# Patient Record
Sex: Female | Born: 1955 | Race: White | Hispanic: No | Marital: Married | Smoking: Never smoker
Health system: Southern US, Community
[De-identification: ages and names within clinical notes are randomized; demographics above are authoritative.]

## PROBLEM LIST (undated history)

## (undated) DIAGNOSIS — E785 Hyperlipidemia, unspecified: Secondary | ICD-10-CM

## (undated) DIAGNOSIS — K5792 Diverticulitis of intestine, part unspecified, without perforation or abscess without bleeding: Secondary | ICD-10-CM

## (undated) DIAGNOSIS — E039 Hypothyroidism, unspecified: Secondary | ICD-10-CM

## (undated) DIAGNOSIS — Z9289 Personal history of other medical treatment: Secondary | ICD-10-CM

## (undated) DIAGNOSIS — M1990 Unspecified osteoarthritis, unspecified site: Secondary | ICD-10-CM

## (undated) DIAGNOSIS — T8859XA Other complications of anesthesia, initial encounter: Secondary | ICD-10-CM

## (undated) DIAGNOSIS — G40909 Epilepsy, unspecified, not intractable, without status epilepticus: Secondary | ICD-10-CM

## (undated) DIAGNOSIS — T4145XA Adverse effect of unspecified anesthetic, initial encounter: Secondary | ICD-10-CM

## (undated) DIAGNOSIS — Z8669 Personal history of other diseases of the nervous system and sense organs: Secondary | ICD-10-CM

## (undated) DIAGNOSIS — R7303 Prediabetes: Secondary | ICD-10-CM

## (undated) DIAGNOSIS — G709 Myoneural disorder, unspecified: Secondary | ICD-10-CM

## (undated) DIAGNOSIS — R569 Unspecified convulsions: Secondary | ICD-10-CM

## (undated) DIAGNOSIS — H539 Unspecified visual disturbance: Secondary | ICD-10-CM

## (undated) HISTORY — DX: Prediabetes: R73.03

## (undated) HISTORY — DX: Diverticulitis of intestine, part unspecified, without perforation or abscess without bleeding: K57.92

## (undated) HISTORY — DX: Myoneural disorder, unspecified: G70.9

## (undated) HISTORY — PX: APPENDECTOMY (OPEN): SHX54

## (undated) HISTORY — PX: SPINE SURGERY: SHX786

## (undated) HISTORY — PX: JOINT REPLACEMENT: SHX530

## (undated) HISTORY — DX: Hyperlipidemia, unspecified: E78.5

## (undated) HISTORY — PX: TONSILLECTOMY: SHX5618

## (undated) HISTORY — PX: SMALL INTESTINE SURGERY: SHX150

## (undated) HISTORY — PX: COLONOSCOPY, DIAGNOSTIC (SCREENING): SHX174

## (undated) HISTORY — PX: ADENOIDECTOMY: SHX3020

## (undated) HISTORY — PX: COLON SURGERY: SHX602

## (undated) HISTORY — DX: Personal history of other diseases of the nervous system and sense organs: Z86.69

## (undated) HISTORY — PX: TONSILLECTOMY: SUR1361

## (undated) HISTORY — PX: LUMBAR LAMINECTOMY: SHX95

---
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
## 2006-05-28 HISTORY — PX: COLON SURGERY: SHX602

## 2008-05-28 LAB — HM COLONOSCOPY

## 2009-04-27 HISTORY — PX: COLON RESECTION: SHX5231

## 2011-05-29 HISTORY — PX: BUNIONECTOMY: SHX129

## 2011-08-29 ENCOUNTER — Emergency Department: Payer: Self-pay | Admitting: Emergency Medicine

## 2011-08-29 LAB — URINALYSIS, COMPLETE
Glucose,UR: NEGATIVE mg/dL (ref 0–75)
Leukocyte Esterase: NEGATIVE
Nitrite: NEGATIVE
Protein: NEGATIVE
RBC,UR: 4 /HPF (ref 0–5)
Squamous Epithelial: 5

## 2011-08-29 LAB — CBC
MCH: 30.3 pg (ref 26.0–34.0)
MCHC: 33.5 g/dL (ref 32.0–36.0)
MCV: 91 fL (ref 80–100)
Platelet: 293 10*3/uL (ref 150–440)
RBC: 4.21 10*6/uL (ref 3.80–5.20)
RDW: 13 % (ref 11.5–14.5)
WBC: 6 10*3/uL (ref 3.6–11.0)

## 2011-08-29 LAB — COMPREHENSIVE METABOLIC PANEL
Albumin: 3.6 g/dL (ref 3.4–5.0)
Alkaline Phosphatase: 47 U/L — ABNORMAL LOW (ref 50–136)
BUN: 13 mg/dL (ref 7–18)
Chloride: 106 mmol/L (ref 98–107)
Co2: 30 mmol/L (ref 21–32)
Creatinine: 0.75 mg/dL (ref 0.60–1.30)
EGFR (Non-African Amer.): >60
Glucose: 87 mg/dL (ref 65–99)
Osmolality: 284 (ref 275–301)
Potassium: 3.8 mmol/L (ref 3.5–5.1)
SGPT (ALT): 17 U/L
Sodium: 143 mmol/L (ref 136–145)

## 2011-08-29 LAB — PREGNANCY, URINE: Pregnancy Test, Urine: NEGATIVE m[IU]/mL

## 2011-08-29 LAB — LIPASE, BLOOD: Lipase: 107 U/L (ref 73–393)

## 2011-12-25 ENCOUNTER — Ambulatory Visit: Payer: Self-pay

## 2011-12-25 NOTE — Progress Notes (Signed)
 Chief Complaint: seizures.     History of Present Illness:    Please see Dr. Bettyann initial note dated March 31, 2008 and previous notes for full details. Briefly, the patient is a 56 year old, right-handed Caucasian female with a history of migraines who started having auras consisting of a rising feeling in her stomach going up to her mouth with her mouth feeling funny for 10 to 15 seconds. Afterwards, this would resolve. These often occurred in clusters but were fairly infrequent. In August 2009, she had a convulsive seizure preceded by such an episode, and then in February 2010 she had another episode.   Her first medication was Levetiracetam  but it made her moody so they tried several other medications and had side effects.She was switched back to Levetiracetam  but then had another episode, and therefore, she was put on Vimpat . She continues to have these auras along with some left arm heaviness. We have gradually increased her medication. She also had some episodes where she would have funny feelings followed by clamping down of her teeth, her neck arching back, and her screaming for help along with the left arm sensation. This would last for a few minutes but often up to 20 minutes. Her medications were increased but we had been asking repeatedly for her to come in for a video EEG.   In March 2012, she had an episode where she slumped to the left side of her chair, her eyes closed and she was able to speak in a slow voice. She had some difficulty getting words out but kept speaking and answered appropriately. Her vital signs were stable although her blood pressure was high. Because of the episode we decided again to try for video EEG, but again she was unable to come in. Since that time, she has continued on Vimpat  and Levetiracetam . She says that she eventually noticed that she always had problems when her Synthroid  has been increased. She of her own accord reduced her Synthroid  by half and since then she  reports that she has felt much better. Since that time she has not had any episodes.  At her last appointment, we tried decreasing her Vimpat  to 150 mg bid but she started getting her auras which are nervous feelings that come over her. She went back up to 200 mg bid. Her Levetiracetam  is 750 mg, 1.5 tabs twice daily. Otherwise she has been doing well. She feels like she tolerates her medication well. She does notice that she has some mild balance problems after she takes her medication.     Past Medical History:     Past Medical History   Diagnosis Date   . Seizures    . Hypertension    . Hypothyroid    . Sinus tachycardia    . Migraine headache    . Adrenal mass      Past Surgical History   Procedure Laterality Date   . Laminectomy lumbar spine         Medications:     Current Outpatient Prescriptions   Medication Sig Dispense Refill   . aspirin  81 mg tablet 1 tab by mouth daily       . clonazePAM  (KLONOPIN ) 0.5 MG tablet 1 tab by mouth as needed as needed       . CRESTOR  5 mg tablet Take 1 tablet by mouth Daily.       . lacosamide  (VIMPAT ) 200 mg tablet Take 1 tablet (200 mg total) by mouth 2 (two) times daily.  65 tablet  5   . levETIRAcetam  (KEPPRA ) 750 MG tablet 1 tab by mouth 2 times a day    4   . levothyroxine  (SYNTHROID , LEVOTHROID) 100 MCG tablet 1 tab by mouth daily       . LOVAZA 1 gram capsule Take 2 tablets by mouth Twice daily.       . metoprolol  (TOPROL -XL) 50 MG XL tablet 1 tab by mouth daily       . triamterene -hydrochlorothiazide  (DYAZIDE ) 37.5-25 mg capsule 1 cap by mouth daily         No current facility-administered medications for this visit.       Allergies:  None.     Physical Examination:    Filed Vitals:    12/25/11 0858   BP: 120/77   Pulse: 69   Resp: 14   Height: 160 cm (5' 3)   Weight: 105.688 kg (233 lb)     The patient was fully alert and oriented, her language was normal, eye movements were intact. Her face was symmetric. There was no dysmetria, there was no drift. Her gait was  normal.     Laboratory Data: pending    Discussion:  This is a 56 year old woman with a history of migraines, adrenal mass, and hypothyroidism with recurrent episodes that were concerning for complex partial seizures. I have witnessed one episode and am very concerned that it is nonepileptic. At this point the patient is still refusing to come in for video EEG. The episodes have improved with the reduction in her thyroid  medication but to me again this is not necessarily proof that these are physiological in nature.  We will continue Vimpat  200mg  bid and Levetiracetam  1125 bid. If she has more episodes or has other problems, we will again consider a video EEG in the future. If she continues to do well, we will continue downward titration in future visits.   The patient knows to call me for any questions, problems or seizures, otherwise she will follow-up in this clinic in six months.     Impression:     1. 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   2. Episodes of teeth clenching and left arm sensations in the past, likely nonepileptic.   3. Migraines.   4. Hypothyroidism.   5. Concerned that symptoms may be related to too much thyroid  replacement.     Recommendations:     1. Continue  Vimpat  200 mg bid and Levetiracetam  1125 mg.   2. We will draw levels today   3. Call for any questions, problems or worsening of seizures.   4. Follow-up in this clinic in six months         I personally performed the service. The service was performed non-incident to a physician and/or without a teaching provider being immediately available.    I was personally with the patient for 30 minutes. More than 50% of this time was spent doing counseling.

## 2012-03-19 ENCOUNTER — Observation Stay: Payer: Self-pay | Admitting: Internal Medicine

## 2012-03-19 LAB — TROPONIN I: Troponin-I: <0.02 ng/mL

## 2012-03-19 LAB — CBC
HCT: 39.3 % (ref 35.0–47.0)
HGB: 13.6 g/dL (ref 12.0–16.0)
MCH: 31.2 pg (ref 26.0–34.0)
MCHC: 34.6 g/dL (ref 32.0–36.0)
MCV: 90 fL (ref 80–100)
Platelet: 313 10*3/uL (ref 150–440)
RBC: 4.36 10*6/uL (ref 3.80–5.20)

## 2012-03-19 LAB — CK TOTAL AND CKMB (NOT AT ARMC)
CK-MB: <0.5 ng/mL — ABNORMAL LOW (ref 0.5–3.6)
CK-MB: <0.5 ng/mL — ABNORMAL LOW (ref 0.5–3.6)

## 2012-03-19 LAB — COMPREHENSIVE METABOLIC PANEL
Alkaline Phosphatase: 49 U/L — ABNORMAL LOW (ref 50–136)
BUN: 19 mg/dL — ABNORMAL HIGH (ref 7–18)
Bilirubin,Total: 0.4 mg/dL (ref 0.2–1.0)
Co2: 29 mmol/L (ref 21–32)
Creatinine: 0.81 mg/dL (ref 0.60–1.30)
EGFR (Non-African Amer.): >60
SGPT (ALT): 20 U/L (ref 12–78)
Total Protein: 7.2 g/dL (ref 6.4–8.2)

## 2012-03-20 DIAGNOSIS — R079 Chest pain, unspecified: Secondary | ICD-10-CM

## 2012-03-20 LAB — CK TOTAL AND CKMB (NOT AT ARMC)
CK, Total: 31 U/L (ref 21–215)
CK-MB: <0.5 ng/mL — ABNORMAL LOW (ref 0.5–3.6)

## 2012-08-13 ENCOUNTER — Ambulatory Visit: Payer: Self-pay | Admitting: Internal Medicine

## 2012-08-19 ENCOUNTER — Emergency Department: Payer: Self-pay | Admitting: Emergency Medicine

## 2012-08-22 ENCOUNTER — Ambulatory Visit: Payer: PRIVATE HEALTH INSURANCE | Admitting: Internal Medicine

## 2012-09-02 ENCOUNTER — Other Ambulatory Visit (HOSPITAL_COMMUNITY): Payer: Self-pay | Admitting: Orthopaedic Surgery

## 2012-09-05 ENCOUNTER — Encounter (HOSPITAL_COMMUNITY): Payer: Self-pay | Admitting: Pharmacy Technician

## 2012-09-15 ENCOUNTER — Encounter (HOSPITAL_COMMUNITY): Payer: Self-pay

## 2012-09-15 ENCOUNTER — Encounter (HOSPITAL_COMMUNITY)
Admission: RE | Admit: 2012-09-15 | Discharge: 2012-09-15 | Disposition: A | Discharge status: Home / Self Care | Payer: 59 | Source: Ambulatory Visit | Attending: Orthopaedic Surgery | Admitting: Orthopaedic Surgery

## 2012-09-15 DIAGNOSIS — Z01812 Encounter for preprocedural laboratory examination: Secondary | ICD-10-CM | POA: Insufficient documentation

## 2012-09-15 HISTORY — DX: Diverticulitis of intestine, part unspecified, without perforation or abscess without bleeding: K57.92

## 2012-09-15 HISTORY — DX: Other complications of anesthesia, initial encounter: T88.59XA

## 2012-09-15 HISTORY — DX: Epilepsy, unspecified, not intractable, without status epilepticus: G40.909

## 2012-09-15 HISTORY — DX: Personal history of other medical treatment: Z92.89

## 2012-09-15 HISTORY — DX: Adverse effect of unspecified anesthetic, initial encounter: T41.45XA

## 2012-09-15 HISTORY — DX: Hypothyroidism, unspecified: E03.9

## 2012-09-15 HISTORY — DX: Unspecified osteoarthritis, unspecified site: M19.90

## 2012-09-15 HISTORY — DX: Hyperlipidemia, unspecified: E78.5

## 2012-09-15 LAB — CBC
HCT: 39.7 % (ref 36.0–46.0)
Hemoglobin: 13.2 g/dL (ref 12.0–15.0)
MCH: 29.9 pg (ref 26.0–34.0)
MCHC: 33.2 g/dL (ref 30.0–36.0)
MCV: 90 fL (ref 78.0–100.0)
Platelets: 328 10*3/uL (ref 150–400)
RBC: 4.41 MIL/uL (ref 3.87–5.11)
RDW: 12.6 % (ref 11.5–15.5)
WBC: 7.2 10*3/uL (ref 4.0–10.5)

## 2012-09-15 LAB — BASIC METABOLIC PANEL
BUN: 15 mg/dL (ref 6–23)
Calcium: 9.7 mg/dL (ref 8.4–10.5)
Creatinine, Ser: 0.67 mg/dL (ref 0.50–1.10)
GFR calc non Af Amer: >90 mL/min (ref >90)
Glucose, Bld: 108 mg/dL — ABNORMAL HIGH (ref 70–99)
Potassium: 4 mEq/L (ref 3.5–5.1)

## 2012-09-15 LAB — URINALYSIS, ROUTINE W REFLEX MICROSCOPIC
Bilirubin Urine: NEGATIVE
Glucose, UA: NEGATIVE mg/dL
Ketones, ur: NEGATIVE mg/dL
Nitrite: NEGATIVE
Protein, ur: NEGATIVE mg/dL
Specific Gravity, Urine: 1.015 (ref 1.005–1.030)
Urobilinogen, UA: 0.2 mg/dL (ref 0.0–1.0)
pH: 7.5 (ref 5.0–8.0)

## 2012-09-15 LAB — URINE MICROSCOPIC-ADD ON

## 2012-09-15 LAB — SURGICAL PCR SCREEN
MRSA, PCR: NEGATIVE
Staphylococcus aureus: NEGATIVE

## 2012-09-15 LAB — PROTIME-INR
INR: 0.9 (ref 0.00–1.49)
Prothrombin Time: 12.1 seconds (ref 11.6–15.2)

## 2012-09-15 NOTE — Progress Notes (Signed)
UA faxed to Dr. Magnus Ivan thru [facility]

## 2012-09-15 NOTE — Patient Instructions (Addendum)
Danette Weinfeld Dorsey  09/15/2012                           YOUR PROCEDURE IS SCHEDULED ON:  09/19/12                PLEASE REPORT TO SHORT STAY CENTER AT : 5:15 AM               CALL THIS NUMBER IF ANY PROBLEMS THE DAY OF SURGERY :               [phone]                      REMEMBER:   Do not eat food or drink liquids AFTER MIDNIGHT    Take these medicines the morning of surgery with A SIP OF WATER:  KEPPRA / VIMPAT / LEVOTHYROXINE / CRESTOR   Do not wear jewelry, make-up   Do not wear lotions, powders, or perfumes.   Do not shave legs or underarms 12 hrs. before surgery (men may shave face)  Do not bring valuables to the hospital.  Contacts, dentures or bridgework may not be worn into surgery.  Leave suitcase in the car. After surgery it may be brought to your room.  For patients admitted to the hospital more than one night, checkout time is 11:00                          The day of discharge.   Patients discharged the day of surgery will not be allowed to drive home                             If going home same day of surgery, must have someone stay with you first                           24 hrs at home and arrange for some one to drive you home from hospital.    Special Instructions:   Please read over the following fact sheets that you were given:               1. MRSA  INFORMATION                      2. [place] PREPARING FOR SURGERY SHEET                                                X_____________________________________________________________________        Failure to follow these instructions may result in cancellation of your surgery

## 2012-09-16 LAB — URINE CULTURE

## 2012-09-19 ENCOUNTER — Encounter (HOSPITAL_COMMUNITY)
Admission: RE | Disposition: A | Discharge status: Home Health | Payer: Self-pay | Source: Ambulatory Visit | Attending: Orthopaedic Surgery

## 2012-09-19 ENCOUNTER — Encounter (HOSPITAL_COMMUNITY): Payer: Self-pay | Admitting: *Deleted

## 2012-09-19 ENCOUNTER — Inpatient Hospital Stay (HOSPITAL_COMMUNITY)
Admission: RE | Admit: 2012-09-19 | Discharge: 2012-09-21 | DRG: 470 | Disposition: A | Discharge status: Home Health | Payer: 59 | Source: Ambulatory Visit | Attending: Orthopaedic Surgery | Admitting: Orthopaedic Surgery

## 2012-09-19 ENCOUNTER — Inpatient Hospital Stay (HOSPITAL_COMMUNITY): Payer: 59

## 2012-09-19 ENCOUNTER — Encounter (HOSPITAL_COMMUNITY): Payer: Self-pay | Admitting: Anesthesiology

## 2012-09-19 ENCOUNTER — Inpatient Hospital Stay (HOSPITAL_COMMUNITY): Payer: 59 | Admitting: Anesthesiology

## 2012-09-19 DIAGNOSIS — M1610 Unilateral primary osteoarthritis, unspecified hip: Principal | ICD-10-CM | POA: Diagnosis present

## 2012-09-19 DIAGNOSIS — Z6841 Body Mass Index (BMI) 40.0 and over, adult: Secondary | ICD-10-CM

## 2012-09-19 DIAGNOSIS — Z01812 Encounter for preprocedural laboratory examination: Secondary | ICD-10-CM

## 2012-09-19 DIAGNOSIS — M169 Osteoarthritis of hip, unspecified: Principal | ICD-10-CM | POA: Diagnosis present

## 2012-09-19 DIAGNOSIS — I10 Essential (primary) hypertension: Secondary | ICD-10-CM | POA: Diagnosis present

## 2012-09-19 DIAGNOSIS — D62 Acute posthemorrhagic anemia: Secondary | ICD-10-CM | POA: Diagnosis not present

## 2012-09-19 DIAGNOSIS — E039 Hypothyroidism, unspecified: Secondary | ICD-10-CM | POA: Diagnosis present

## 2012-09-19 HISTORY — PX: TOTAL HIP ARTHROPLASTY: SHX124

## 2012-09-19 SURGERY — ARTHROPLASTY, HIP, TOTAL, ANTERIOR APPROACH
Anesthesia: Spinal | Site: Hip | Laterality: Right | Wound class: Clean

## 2012-09-19 MED ORDER — ACETAMINOPHEN 10 MG/ML IV SOLN
INTRAVENOUS | Status: DC | PRN
  Administered 2012-09-19: 1000 mg via INTRAVENOUS

## 2012-09-19 MED ORDER — SODIUM CHLORIDE 0.9 % IV SOLN
INTRAVENOUS | Status: DC
  Administered 2012-09-19 – 2012-09-20 (×3): via INTRAVENOUS

## 2012-09-19 MED ORDER — ONDANSETRON HCL 4 MG PO TABS: 4.0000 mg | ORAL_TABLET | Freq: Four times a day (QID) | ORAL | Status: DC | PRN

## 2012-09-19 MED ORDER — MENTHOL 3 MG MT LOZG: 1.0000 | LOZENGE | OROMUCOSAL | Status: DC | PRN

## 2012-09-19 MED ORDER — DIPHENHYDRAMINE HCL 12.5 MG/5ML PO ELIX: 12.5000 mg | ORAL_SOLUTION | ORAL | Status: DC | PRN

## 2012-09-19 MED ORDER — PHENYLEPHRINE HCL 10 MG/ML IJ SOLN
10.0000 mg | INTRAVENOUS | Status: DC | PRN
  Administered 2012-09-19: 10 ug/min via INTRAVENOUS

## 2012-09-19 MED ORDER — ATORVASTATIN CALCIUM 10 MG PO TABS
10.0000 mg | ORAL_TABLET | Freq: Every day | ORAL | Status: DC
  Administered 2012-09-20: 10 mg via ORAL
  Filled 2012-09-19 (×2): qty 1

## 2012-09-19 MED ORDER — OXYCODONE HCL ER 10 MG PO T12A
10.0000 mg | EXTENDED_RELEASE_TABLET | Freq: Two times a day (BID) | ORAL | Status: DC
  Administered 2012-09-19 – 2012-09-21 (×5): 10 mg via ORAL
  Filled 2012-09-19 (×5): qty 1

## 2012-09-19 MED ORDER — DEXAMETHASONE SODIUM PHOSPHATE 10 MG/ML IJ SOLN
INTRAMUSCULAR | Status: DC | PRN
  Administered 2012-09-19: 10 mg via INTRAVENOUS

## 2012-09-19 MED ORDER — METHOCARBAMOL 500 MG PO TABS
500.0000 mg | ORAL_TABLET | Freq: Four times a day (QID) | ORAL | Status: DC | PRN
  Administered 2012-09-20 – 2012-09-21 (×5): 500 mg via ORAL
  Filled 2012-09-19 (×5): qty 1

## 2012-09-19 MED ORDER — ALUM & MAG HYDROXIDE-SIMETH 200-200-20 MG/5ML PO SUSP: 30.0000 mL | ORAL | Status: DC | PRN

## 2012-09-19 MED ORDER — HYDROMORPHONE HCL PF 1 MG/ML IJ SOLN
1.0000 mg | INTRAMUSCULAR | Status: DC | PRN
  Administered 2012-09-19: 0.5 mg via INTRAVENOUS
  Filled 2012-09-19: qty 1

## 2012-09-19 MED ORDER — POLYETHYLENE GLYCOL 3350 17 G PO PACK
17.0000 g | PACK | Freq: Every day | ORAL | Status: DC
  Administered 2012-09-19 – 2012-09-20 (×2): 17 g via ORAL

## 2012-09-19 MED ORDER — DOCUSATE SODIUM 100 MG PO CAPS
100.0000 mg | ORAL_CAPSULE | Freq: Two times a day (BID) | ORAL | Status: DC
  Administered 2012-09-19 – 2012-09-21 (×4): 100 mg via ORAL

## 2012-09-19 MED ORDER — SODIUM CHLORIDE 0.9 % IR SOLN
Status: DC | PRN
  Administered 2012-09-19: 1000 mL

## 2012-09-19 MED ORDER — STERILE WATER FOR IRRIGATION IR SOLN
Status: DC | PRN
  Administered 2012-09-19: 3000 mL

## 2012-09-19 MED ORDER — ASPIRIN EC 325 MG PO TBEC
325.0000 mg | DELAYED_RELEASE_TABLET | Freq: Two times a day (BID) | ORAL | Status: DC
  Administered 2012-09-20 – 2012-09-21 (×3): 325 mg via ORAL
  Filled 2012-09-19 (×5): qty 1

## 2012-09-19 MED ORDER — PHENYLEPHRINE HCL 10 MG/ML IJ SOLN
INTRAMUSCULAR | Status: DC | PRN
  Administered 2012-09-19 (×4): 80 ug via INTRAVENOUS

## 2012-09-19 MED ORDER — KETAMINE HCL 50 MG/ML IJ SOLN
INTRAMUSCULAR | Status: DC | PRN
  Administered 2012-09-19: 50 mg via INTRAMUSCULAR

## 2012-09-19 MED ORDER — LIDOCAINE HCL (CARDIAC) 20 MG/ML IV SOLN
INTRAVENOUS | Status: DC | PRN
  Administered 2012-09-19: 100 mg via INTRAVENOUS

## 2012-09-19 MED ORDER — CEFAZOLIN SODIUM-DEXTROSE 2-3 GM-% IV SOLR
2.0000 g | INTRAVENOUS | Status: AC
  Administered 2012-09-19: 2 g via INTRAVENOUS

## 2012-09-19 MED ORDER — METHOCARBAMOL 100 MG/ML IJ SOLN: 500.0000 mg | Freq: Four times a day (QID) | INTRAMUSCULAR | Status: DC | PRN

## 2012-09-19 MED ORDER — SUCCINYLCHOLINE CHLORIDE 20 MG/ML IJ SOLN
INTRAMUSCULAR | Status: DC | PRN
  Administered 2012-09-19: 100 mg via INTRAVENOUS

## 2012-09-19 MED ORDER — PROPOFOL 10 MG/ML IV BOLUS
INTRAVENOUS | Status: DC | PRN
  Administered 2012-09-19: 200 mg via INTRAVENOUS
  Administered 2012-09-19: 40 mg via INTRAVENOUS

## 2012-09-19 MED ORDER — LACOSAMIDE 50 MG PO TABS
200.0000 mg | ORAL_TABLET | Freq: Two times a day (BID) | ORAL | Status: DC
  Administered 2012-09-19 – 2012-09-21 (×4): 200 mg via ORAL
  Filled 2012-09-19 (×4): qty 4

## 2012-09-19 MED ORDER — ACETAMINOPHEN 650 MG RE SUPP: 650.0000 mg | Freq: Four times a day (QID) | RECTAL | Status: DC | PRN

## 2012-09-19 MED ORDER — CEFAZOLIN SODIUM 1-5 GM-% IV SOLN
1.0000 g | Freq: Four times a day (QID) | INTRAVENOUS | Status: AC
  Administered 2012-09-19 (×2): 1 g via INTRAVENOUS
  Filled 2012-09-19 (×2): qty 50

## 2012-09-19 MED ORDER — GLYCOPYRROLATE 0.2 MG/ML IJ SOLN
INTRAMUSCULAR | Status: DC | PRN
  Administered 2012-09-19: 0.2 mg via INTRAVENOUS

## 2012-09-19 MED ORDER — SUFENTANIL CITRATE 50 MCG/ML IV SOLN
INTRAVENOUS | Status: DC | PRN
  Administered 2012-09-19: 5 ug via INTRAVENOUS
  Administered 2012-09-19 (×2): 10 ug via INTRAVENOUS

## 2012-09-19 MED ORDER — HYDROMORPHONE HCL PF 1 MG/ML IJ SOLN
0.2500 mg | INTRAMUSCULAR | Status: DC | PRN
  Administered 2012-09-19 (×2): 0.25 mg via INTRAVENOUS
  Administered 2012-09-19: 0.5 mg via INTRAVENOUS

## 2012-09-19 MED ORDER — EPHEDRINE SULFATE 50 MG/ML IJ SOLN
INTRAMUSCULAR | Status: DC | PRN
  Administered 2012-09-19: 10 mg via INTRAVENOUS

## 2012-09-19 MED ORDER — ZOLPIDEM TARTRATE 5 MG PO TABS: 5.0000 mg | ORAL_TABLET | Freq: Every evening | ORAL | Status: DC | PRN

## 2012-09-19 MED ORDER — ONDANSETRON HCL 4 MG/2ML IJ SOLN
INTRAMUSCULAR | Status: DC | PRN
  Administered 2012-09-19: 4 mg via INTRAVENOUS

## 2012-09-19 MED ORDER — PROMETHAZINE HCL 25 MG/ML IJ SOLN: 6.2500 mg | INTRAMUSCULAR | Status: DC | PRN

## 2012-09-19 MED ORDER — LEVOTHYROXINE SODIUM 137 MCG PO TABS
68.5000 ug | ORAL_TABLET | Freq: Every day | ORAL | Status: DC
  Administered 2012-09-20 – 2012-09-21 (×2): 68.5 ug via ORAL
  Filled 2012-09-19 (×4): qty 0.5

## 2012-09-19 MED ORDER — ACETAMINOPHEN 325 MG PO TABS
650.0000 mg | ORAL_TABLET | Freq: Four times a day (QID) | ORAL | Status: DC | PRN
  Filled 2012-09-19: qty 2

## 2012-09-19 MED ORDER — FENTANYL CITRATE 0.05 MG/ML IJ SOLN
25.0000 ug | INTRAMUSCULAR | Status: DC | PRN
  Administered 2012-09-19: 50 ug via INTRAVENOUS
  Administered 2012-09-19 (×2): 25 ug via INTRAVENOUS

## 2012-09-19 MED ORDER — LACTATED RINGERS IV SOLN
INTRAVENOUS | Status: DC | PRN
  Administered 2012-09-19 (×3): via INTRAVENOUS

## 2012-09-19 MED ORDER — LEVETIRACETAM 750 MG PO TABS
1500.0000 mg | ORAL_TABLET | Freq: Every day | ORAL | Status: DC
  Administered 2012-09-20: 1500 mg via ORAL
  Filled 2012-09-19 (×3): qty 2

## 2012-09-19 MED ORDER — ATORVASTATIN CALCIUM 10 MG PO TABS
10.0000 mg | ORAL_TABLET | Freq: Every day | ORAL | Status: DC
  Filled 2012-09-19: qty 1

## 2012-09-19 MED ORDER — METOCLOPRAMIDE HCL 5 MG/ML IJ SOLN: 5.0000 mg | Freq: Three times a day (TID) | INTRAMUSCULAR | Status: DC | PRN

## 2012-09-19 MED ORDER — ONDANSETRON HCL 4 MG/2ML IJ SOLN: 4.0000 mg | Freq: Four times a day (QID) | INTRAMUSCULAR | Status: DC | PRN

## 2012-09-19 MED ORDER — TRIAMTERENE-HCTZ 37.5-25 MG PO TABS
1.0000 | ORAL_TABLET | Freq: Every day | ORAL | Status: DC
  Administered 2012-09-21: 1 via ORAL
  Filled 2012-09-19 (×2): qty 1

## 2012-09-19 MED ORDER — LEVETIRACETAM 750 MG PO TABS
1125.0000 mg | ORAL_TABLET | Freq: Every morning | ORAL | Status: DC
  Administered 2012-09-20: 1125 mg via ORAL
  Filled 2012-09-19: qty 1.5

## 2012-09-19 MED ORDER — METOCLOPRAMIDE HCL 10 MG PO TABS: 5.0000 mg | ORAL_TABLET | Freq: Three times a day (TID) | ORAL | Status: DC | PRN

## 2012-09-19 MED ORDER — HYDROMORPHONE HCL PF 1 MG/ML IJ SOLN
INTRAMUSCULAR | Status: DC | PRN
  Administered 2012-09-19: 1 mg via INTRAVENOUS

## 2012-09-19 MED ORDER — TRIAMTERENE-HCTZ 37.5-25 MG PO CAPS
1.0000 | ORAL_CAPSULE | Freq: Every day | ORAL | Status: DC
  Filled 2012-09-19 (×2): qty 1

## 2012-09-19 MED ORDER — ROCURONIUM BROMIDE 100 MG/10ML IV SOLN
INTRAVENOUS | Status: DC | PRN
  Administered 2012-09-19: 5 mg via INTRAVENOUS

## 2012-09-19 MED ORDER — MIDAZOLAM HCL 5 MG/5ML IJ SOLN
INTRAMUSCULAR | Status: DC | PRN
  Administered 2012-09-19: 2 mg via INTRAVENOUS

## 2012-09-19 MED ORDER — METOPROLOL SUCCINATE ER 50 MG PO TB24
50.0000 mg | ORAL_TABLET | Freq: Every evening | ORAL | Status: DC
  Administered 2012-09-19: 50 mg via ORAL
  Filled 2012-09-19 (×3): qty 1

## 2012-09-19 MED ORDER — 0.9 % SODIUM CHLORIDE (POUR BTL) OPTIME
TOPICAL | Status: DC | PRN
  Administered 2012-09-19: 1000 mL

## 2012-09-19 MED ORDER — ADULT MULTIVITAMIN W/MINERALS CH
1.0000 | ORAL_TABLET | Freq: Every day | ORAL | Status: DC
  Administered 2012-09-20 – 2012-09-21 (×2): 1 via ORAL
  Filled 2012-09-19 (×3): qty 1

## 2012-09-19 MED ORDER — OXYCODONE HCL 5 MG PO TABS
5.0000 mg | ORAL_TABLET | ORAL | Status: DC | PRN
  Administered 2012-09-19 – 2012-09-21 (×8): 10 mg via ORAL
  Filled 2012-09-19 (×8): qty 2

## 2012-09-19 MED ORDER — LACOSAMIDE 200 MG PO TABS: 200.0000 mg | ORAL_TABLET | Freq: Two times a day (BID) | ORAL | Status: DC

## 2012-09-19 MED ORDER — PHENOL 1.4 % MT LIQD: 1.0000 | OROMUCOSAL | Status: DC | PRN

## 2012-09-19 MED ORDER — NEOSTIGMINE METHYLSULFATE 1 MG/ML IJ SOLN: INTRAMUSCULAR | Status: DC | PRN

## 2012-09-19 MED ORDER — LEVETIRACETAM 750 MG PO TABS: 1125.0000 mg | ORAL_TABLET | Freq: Two times a day (BID) | ORAL | Status: DC

## 2012-09-19 SURGICAL SUPPLY — 36 items
BAG ZIPLOCK 12X15 (MISCELLANEOUS) ×4 IMPLANT
BLADE SAW SGTL 18X1.27X75 (BLADE) ×2 IMPLANT
CELLS DAT CNTRL 66122 CELL SVR (MISCELLANEOUS) ×1 IMPLANT
CLOTH BEACON ORANGE TIMEOUT ST (SAFETY) ×2 IMPLANT
DERMABOND ADVANCED (GAUZE/BANDAGES/DRESSINGS) ×1
DERMABOND ADVANCED .7 DNX12 (GAUZE/BANDAGES/DRESSINGS) ×1 IMPLANT
DRAPE C-ARM 42X72 X-RAY (DRAPES) ×2 IMPLANT
DRAPE STERI IOBAN 125X83 (DRAPES) ×2 IMPLANT
DRAPE U-SHAPE 47X51 STRL (DRAPES) ×6 IMPLANT
DRSG AQUACEL AG ADV 3.5X10 (GAUZE/BANDAGES/DRESSINGS) ×2 IMPLANT
DURAPREP 26ML APPLICATOR (WOUND CARE) ×2 IMPLANT
ELECT BLADE TIP CTD 4 INCH (ELECTRODE) ×2 IMPLANT
ELECT REM PT RETURN 9FT ADLT (ELECTROSURGICAL) ×2
ELECTRODE REM PT RTRN 9FT ADLT (ELECTROSURGICAL) ×1 IMPLANT
FACESHIELD LNG OPTICON STERILE (SAFETY) ×8 IMPLANT
GLOVE BIO SURGEON STRL SZ7.5 (GLOVE) ×2 IMPLANT
GLOVE BIOGEL PI IND STRL 8 (GLOVE) ×3 IMPLANT
GLOVE BIOGEL PI INDICATOR 8 (GLOVE) ×3
GLOVE ECLIPSE 8.0 STRL XLNG CF (GLOVE) ×4 IMPLANT
GOWN STRL REIN XL XLG (GOWN DISPOSABLE) ×4 IMPLANT
HANDPIECE INTERPULSE COAX TIP (DISPOSABLE) ×1
KIT BASIN OR (CUSTOM PROCEDURE TRAY) ×2 IMPLANT
PACK TOTAL JOINT (CUSTOM PROCEDURE TRAY) ×2 IMPLANT
PADDING CAST COTTON 6X4 STRL (CAST SUPPLIES) ×2 IMPLANT
RTRCTR WOUND ALEXIS 18CM MED (MISCELLANEOUS) ×2
SET HNDPC FAN SPRY TIP SCT (DISPOSABLE) ×1 IMPLANT
SUT ETHIBOND NAB CT1 #1 30IN (SUTURE) ×4 IMPLANT
SUT ETHILON 3 0 PS 1 (SUTURE) IMPLANT
SUT MNCRL AB 4-0 PS2 18 (SUTURE) ×2 IMPLANT
SUT VIC AB 0 CT1 36 (SUTURE) IMPLANT
SUT VIC AB 1 CT1 36 (SUTURE) ×4 IMPLANT
SUT VIC AB 2-0 CT1 27 (SUTURE) ×2
SUT VIC AB 2-0 CT1 TAPERPNT 27 (SUTURE) ×2 IMPLANT
TOWEL OR 17X26 10 PK STRL BLUE (TOWEL DISPOSABLE) ×2 IMPLANT
TOWEL OR NON WOVEN STRL DISP B (DISPOSABLE) ×2 IMPLANT
TRAY FOLEY CATH 14FRSI W/METER (CATHETERS) ×2 IMPLANT

## 2012-09-19 NOTE — H&P (Signed)
TOTAL HIP ADMISSION H&P  Patient is admitted for right total hip arthroplasty.  Subjective:  Chief Complaint: right hip pain  HPI: Jennifer [name], 57 y.o. female, has a history of pain and functional disability in the right hip(s) due to arthritis and patient has failed non-surgical conservative treatments for greater than 12 weeks to include NSAID's and/or analgesics, use of assistive devices, weight reduction as appropriate and activity modification.  Onset of symptoms was gradual starting 3 years ago with gradually worsening course since that time.The patient noted no past surgery on the right hip(s).  Patient currently rates pain in the right hip at 8 out of 10 with activity. Patient has night pain, worsening of pain with activity and weight bearing, trendelenberg gait, pain that interfers with activities of daily living, pain with passive range of motion and crepitus. Patient has evidence of subchondral cysts, subchondral sclerosis, periarticular osteophytes and joint space narrowing by imaging studies. This condition presents safety issues increasing the risk of falls.  There is no current active infection.  Patient Active Problem List   Diagnosis Date Noted  . Degenerative arthritis of hip 09/19/2012   Past Medical History  Diagnosis Date  . Complication of anesthesia     severe tremors "shakes"  . Hyperlipidemia   . Epilepsy     last seizure 4 yrs ago  . Arthritis   . Diverticulitis   . History of transfusion   . Hypothyroidism     Past Surgical History  Procedure Laterality Date  . Colon surgery    . Lumbar laminectomy      age 1  . Tonsillectomy    . Joint replacement      left total hip  . Bunionectomy  2013    Prescriptions prior to admission  Medication Sig Dispense Refill  . lacosamide (VIMPAT) 200 MG TABS Take 200 mg by mouth 2 (two) times daily.      Marland Kitchen levETIRAcetam (KEPPRA) 750 MG tablet Take 1,125-1,500 mg by mouth every 12 (twelve) hours. Takes 1 and  1/2 tablet in the morning and 2 tablets at night      . levothyroxine (SYNTHROID, LEVOTHROID) 137 MCG tablet Take 68.5 mcg by mouth daily before breakfast. Takes 1/2 tablet      . metoprolol succinate (TOPROL-XL) 50 MG 24 hr tablet Take 50 mg by mouth every evening. Take with or immediately following a meal.      . Multiple Vitamin (MULTIVITAMIN WITH MINERALS) TABS Take 1 tablet by mouth daily.      Marland Kitchen omega-3 acid ethyl esters (LOVAZA) 1 G capsule Take 2 g by mouth 2 (two) times daily.      . polyethylene glycol (MIRALAX / GLYCOLAX) packet Take 17 g by mouth daily.      . rosuvastatin (CRESTOR) 5 MG tablet Take 5 mg by mouth daily before breakfast.      . triamterene-hydrochlorothiazide (DYAZIDE) 37.5-25 MG per capsule Take 1 capsule by mouth daily before breakfast.      . aspirin 325 MG tablet Take 325 mg by mouth daily.       No Known Allergies  History  Substance Use Topics  . Smoking status: Never Smoker   . Smokeless tobacco: Not on file  . Alcohol Use: No    History reviewed. No pertinent family history.   Review of Systems  Musculoskeletal: Positive for joint pain.  All other systems reviewed and are negative.    Objective:  Physical Exam  Constitutional: She is oriented to  person, place, and time. She appears well-developed and well-nourished.  HENT:  Head: Normocephalic and atraumatic.  Eyes: EOM are normal. Pupils are equal, round, and reactive to light.  Neck: Normal range of motion. Neck supple.  Cardiovascular: Normal rate and regular rhythm.   Respiratory: Effort normal and breath sounds normal.  GI: Soft. Bowel sounds are normal.  Musculoskeletal:       Right hip: She exhibits decreased range of motion, decreased strength, bony tenderness and crepitus.  Neurological: She is alert and oriented to person, place, and time.  Skin: Skin is warm and dry.  Psychiatric: She has a normal mood and affect.   Her right leg is also shorter than her left leg due to a  previous left total hip replacement.  Vital signs in last 24 hours: Temp:  [98 F (36.7 C)] 98 F (36.7 C) (04/25 0544) Pulse Rate:  [55] 55 (04/25 0544) Resp:  [18] 18 (04/25 0544) BP: (123)/(70) 123/70 mmHg (04/25 0544) SpO2:  [97 %] 97 % (04/25 0544)  Labs:   There is no weight on file to calculate BMI.   Imaging Review Plain radiographs demonstrate severe degenerative joint disease of the right hip(s). The bone quality appears to be good for age and reported activity level.  Assessment/Plan:  End stage arthritis, right hip(s)  The patient history, physical examination, clinical judgement of the provider and imaging studies are consistent with end stage degenerative joint disease of the right hip(s) and total hip arthroplasty is deemed medically necessary. The treatment options including medical management, injection therapy, arthroscopy and arthroplasty were discussed at length. The risks and benefits of total hip arthroplasty were presented and reviewed. The risks due to aseptic loosening, infection, stiffness, dislocation/subluxation,  thromboembolic complications and other imponderables were discussed.  The patient acknowledged the explanation, agreed to proceed with the plan and consent was signed. Patient is being admitted for inpatient treatment for surgery, pain control, PT, OT, prophylactic antibiotics, VTE prophylaxis, progressive ambulation and ADL's and discharge planning.The patient is planning to be discharged home with home health services

## 2012-09-19 NOTE — Brief Op Note (Signed)
09/19/2012  9:22 AM  PATIENT:  Darlene Ford  57 y.o. female  PRE-OPERATIVE DIAGNOSIS:  Severe osteoarthritis right hip  POST-OPERATIVE DIAGNOSIS:  Severe osteoarthritis right hip  PROCEDURE:  Procedure(s): RIGHT TOTAL HIP ARTHROPLASTY ANTERIOR APPROACH (Right)  SURGEON:  Surgeon(s) and Role:    * Kathryne Hitch, MD - Primary  PHYSICIAN ASSISTANT:   Rexene Edison, PA-C  ANESTHESIA:   general  EBL:  Total I/O In: 2000 [I.V.:2000] Out: 500 [Urine:125; Blood:375]  BLOOD ADMINISTERED:none  DRAINS: none   LOCAL MEDICATIONS USED:  NONE  SPECIMEN:  No Specimen  DISPOSITION OF SPECIMEN:  N/A  COUNTS:  YES  TOURNIQUET:  * No tourniquets in log *  DICTATION: .Other Dictation: Dictation Number [id number]  PLAN OF CARE: Admit to inpatient   PATIENT DISPOSITION:  PACU - hemodynamically stable.   Delay start of Pharmacological VTE agent (>24hrs) due to surgical blood loss or risk of bleeding: no

## 2012-09-19 NOTE — Transfer of Care (Signed)
Immediate Anesthesia Transfer of Care Note  Patient: Darlene Ford  Procedure(s) Performed: Procedure(s): RIGHT TOTAL HIP ARTHROPLASTY ANTERIOR APPROACH (Right)  Patient Location: PACU  Anesthesia Type:General  Level of Consciousness: awake, alert , oriented and patient cooperative  Airway & Oxygen Therapy: Patient Spontanous Breathing and Patient connected to face mask oxygen  Post-op Assessment: Report given to PACU RN, Post -op Vital signs reviewed and stable and Patient moving all extremities X 4  Post vital signs: stable  Complications: No apparent anesthesia complications

## 2012-09-19 NOTE — Anesthesia Postprocedure Evaluation (Signed)
  Anesthesia Post-op Note  Patient: Darlene Ford  Procedure(s) Performed: Procedure(s) (LRB): RIGHT TOTAL HIP ARTHROPLASTY ANTERIOR APPROACH (Right)  Patient Location: PACU  Anesthesia Type: General  Level of Consciousness: awake and alert   Airway and Oxygen Therapy: Patient Spontanous Breathing  Post-op Pain: mild  Post-op Assessment: Post-op Vital signs reviewed, Patient's Cardiovascular Status Stable, Respiratory Function Stable, Patent Airway and No signs of Nausea or vomiting  Last Vitals:  Filed Vitals:   09/19/12 1000  BP: 134/73  Pulse: 77  Temp:   Resp: 11    Post-op Vital Signs: stable   Complications: No apparent anesthesia complications

## 2012-09-19 NOTE — H&P (Signed)
TOTAL HIP ADMISSION H&P  Patient is admitted for right total hip arthroplasty.  Subjective:  Chief Complaint: right hip pain  HPI: Darlene Ford, 57 y.o. female, has a history of pain and functional disability in the right hip(s) due to arthritis and patient has failed non-surgical conservative treatments for greater than 12 weeks to include NSAID's and/or analgesics, use of assistive devices, weight reduction as appropriate and activity modification.  Onset of symptoms was gradual starting 3 years ago with gradually worsening course since that time.The patient noted no past surgery on the right hip(s).  Patient currently rates pain in the right hip at 8 out of 10 with activity. Patient has night pain, worsening of pain with activity and weight bearing, trendelenberg gait, pain that interfers with activities of daily living, pain with passive range of motion and crepitus. Patient has evidence of subchondral cysts, subchondral sclerosis, periarticular osteophytes and joint space narrowing by imaging studies. This condition presents safety issues increasing the risk of falls.  There is no current active infection.  Patient Active Problem List   Diagnosis Date Noted  . Degenerative arthritis of hip 09/19/2012   Past Medical History  Diagnosis Date  . Complication of anesthesia     severe tremors "shakes"  . Hyperlipidemia   . Epilepsy     last seizure 4 yrs ago  . Arthritis   . Diverticulitis   . History of transfusion   . Hypothyroidism     Past Surgical History  Procedure Laterality Date  . Colon surgery    . Lumbar laminectomy      age 1  . Tonsillectomy    . Joint replacement      left total hip  . Bunionectomy  2013    Prescriptions prior to admission  Medication Sig Dispense Refill  . lacosamide (VIMPAT) 200 MG TABS Take 200 mg by mouth 2 (two) times daily.      Marland Kitchen levETIRAcetam (KEPPRA) 750 MG tablet Take 1,125-1,500 mg by mouth every 12 (twelve) hours. Takes 1 and  1/2 tablet in the morning and 2 tablets at night      . levothyroxine (SYNTHROID, LEVOTHROID) 137 MCG tablet Take 68.5 mcg by mouth daily before breakfast. Takes 1/2 tablet      . metoprolol succinate (TOPROL-XL) 50 MG 24 hr tablet Take 50 mg by mouth every evening. Take with or immediately following a meal.      . Multiple Vitamin (MULTIVITAMIN WITH MINERALS) TABS Take 1 tablet by mouth daily.      Marland Kitchen omega-3 acid ethyl esters (LOVAZA) 1 G capsule Take 2 g by mouth 2 (two) times daily.      . polyethylene glycol (MIRALAX / GLYCOLAX) packet Take 17 g by mouth daily.      . rosuvastatin (CRESTOR) 5 MG tablet Take 5 mg by mouth daily before breakfast.      . triamterene-hydrochlorothiazide (DYAZIDE) 37.5-25 MG per capsule Take 1 capsule by mouth daily before breakfast.      . aspirin 325 MG tablet Take 325 mg by mouth daily.       No Known Allergies  History  Substance Use Topics  . Smoking status: Never Smoker   . Smokeless tobacco: Not on file  . Alcohol Use: No    History reviewed. No pertinent family history.   Review of Systems  Musculoskeletal: Positive for joint pain.  All other systems reviewed and are negative.    Objective:  Physical Exam  Constitutional: She is oriented to  person, place, and time. She appears well-developed and well-nourished.  HENT:  Head: Normocephalic and atraumatic.  Eyes: EOM are normal. Pupils are equal, round, and reactive to light.  Neck: Normal range of motion. Neck supple.  Cardiovascular: Normal rate and regular rhythm.   Respiratory: Effort normal and breath sounds normal.  GI: Soft. Bowel sounds are normal.  Musculoskeletal:       Right hip: She exhibits decreased range of motion, decreased strength, bony tenderness and crepitus.  Neurological: She is alert and oriented to person, place, and time.  Skin: Skin is warm and dry.  Psychiatric: She has a normal mood and affect.   Her right leg is also shorter than her left leg due to a  previous left total hip replacement.  Vital signs in last 24 hours: Temp:  [98 F (36.7 C)] 98 F (36.7 C) (04/25 0544) Pulse Rate:  [55] 55 (04/25 0544) Resp:  [18] 18 (04/25 0544) BP: (123)/(70) 123/70 mmHg (04/25 0544) SpO2:  [97 %] 97 % (04/25 0544)  Labs:   There is no weight on file to calculate BMI.   Imaging Review Plain radiographs demonstrate severe degenerative joint disease of the right hip(s). The bone quality appears to be good for age and reported activity level.  Assessment/Plan:  End stage arthritis, right hip(s)  The patient history, physical examination, clinical judgement of the provider and imaging studies are consistent with end stage degenerative joint disease of the right hip(s) and total hip arthroplasty is deemed medically necessary. The treatment options including medical management, injection therapy, arthroscopy and arthroplasty were discussed at length. The risks and benefits of total hip arthroplasty were presented and reviewed. The risks due to aseptic loosening, infection, stiffness, dislocation/subluxation,  thromboembolic complications and other imponderables were discussed.  The patient acknowledged the explanation, agreed to proceed with the plan and consent was signed. Patient is being admitted for inpatient treatment for surgery, pain control, PT, OT, prophylactic antibiotics, VTE prophylaxis, progressive ambulation and ADL's and discharge planning.The patient is planning to be discharged home with home health services

## 2012-09-19 NOTE — Anesthesia Preprocedure Evaluation (Addendum)
Anesthesia Evaluation  Patient identified by MRN, date of birth, ID band Patient awake    Reviewed: Allergy & Precautions, H&P , NPO status , Patient's Chart, lab work & pertinent test results  Airway Mallampati: III TM Distance: <3 FB Neck ROM: Full    Dental no notable dental hx.    Pulmonary neg pulmonary ROS,  breath sounds clear to auscultation  Pulmonary exam normal       Cardiovascular hypertension, Pt. on medications Rhythm:Regular Rate:Normal     Neuro/Psych negative neurological ROS  negative psych ROS   GI/Hepatic negative GI ROS, Neg liver ROS,   Endo/Other  Hypothyroidism Morbid obesity  Renal/GU negative Renal ROS  negative genitourinary   Musculoskeletal negative musculoskeletal ROS (+)   Abdominal   Peds negative pediatric ROS (+)  Hematology negative hematology ROS (+)   Anesthesia Other Findings   Reproductive/Obstetrics negative OB ROS                           Anesthesia Physical Anesthesia Plan  ASA: III  Anesthesia Plan: General   Post-op Pain Management:    Induction: Intravenous  Airway Management Planned: Oral ETT  Additional Equipment:   Intra-op Plan:   Post-operative Plan: Extubation in OR  Informed Consent: I have reviewed the patients History and Physical, chart, labs and discussed the procedure including the risks, benefits and alternatives for the proposed anesthesia with the patient or authorized representative who has indicated his/her understanding and acceptance.     Plan Discussed with: CRNA and Surgeon  Anesthesia Plan Comments:        Anesthesia Quick Evaluation

## 2012-09-19 NOTE — Evaluation (Signed)
Physical Therapy Evaluation Patient Details Name: Darlene Ford MRN: [id number] DOB: 1956/01/18 Today's Date: 09/19/2012 Time: 6295-2841 PT Time Calculation (min): 15 min  PT Assessment / Plan / Recommendation Clinical Impression  Pt is a 57 year old female s/p R direct anterior THR.  Pt would benefit from acute PT services in order to improve independence with transfers, ambulation, and stairs to prepare for d/c home with spouse.  Pt reports she would like to be able to perform stairs to 2nd level bedroom if possible however can stay in downstairs bedroom if needed.    PT Assessment  Patient needs continued PT services    Follow Up Recommendations  Home health PT    Does the patient have the potential to tolerate intense rehabilitation      Barriers to Discharge        Equipment Recommendations  None recommended by PT    Recommendations for Other Services     Frequency 7X/week    Precautions / Restrictions Precautions Precautions: None Restrictions RLE Weight Bearing: Weight bearing as tolerated   Pertinent Vitals/Pain Premedicated just prior to therapy, ice pack applied      Mobility  Bed Mobility Bed Mobility: Supine to Sit;Sit to Supine Supine to Sit: 4: Min assist;HOB elevated Sit to Supine: 4: Min assist;HOB elevated Details for Bed Mobility Assistance: assist for R LE Transfers Transfers: Sit to Stand;Stand to Sit Sit to Stand: 4: Min assist;With upper extremity assist;From bed Stand to Sit: 4: Min assist;With upper extremity assist;To bed Details for Transfer Assistance: verbal cues for safe technique including feet and hand placement Ambulation/Gait Ambulation/Gait Assistance: 4: Min guard Ambulation Distance (Feet): 40 Feet Assistive device: Rolling walker Ambulation/Gait Assistance Details: verbal cues for technique, sequence, RW distance, pt nauseated upon return to supine Gait Pattern: Step-to pattern;Trunk flexed Gait velocity:  decreased General Gait Details: pt reports burning groin and anterior hip pain during ambulation however states that was also present prior to surgery    Exercises     PT Diagnosis: Difficulty walking;Acute pain  PT Problem List: Decreased strength;Decreased mobility;Decreased activity tolerance;Decreased knowledge of use of DME;Pain PT Treatment Interventions: DME instruction;Gait training;Stair training;Functional mobility training;Therapeutic activities;Therapeutic exercise;Patient/family education   PT Goals Acute Rehab PT Goals PT Goal Formulation: With patient Time For Goal Achievement: 09/26/12 Potential to Achieve Goals: Good Pt will go Supine/Side to Sit: with modified independence PT Goal: Supine/Side to Sit - Progress: Goal set today Pt will go Sit to Stand: with modified independence PT Goal: Sit to Stand - Progress: Goal set today Pt will go Stand to Sit: with modified independence PT Goal: Stand to Sit - Progress: Goal set today Pt will Ambulate: 51 - 150 feet;with least restrictive assistive device;with modified independence PT Goal: Ambulate - Progress: Goal set today Pt will Go Up / Down Stairs: 3-5 stairs;with supervision;with rail(s);with least restrictive assistive device PT Goal: Up/Down Stairs - Progress: Goal set today Pt will Perform Home Exercise Program: with supervision, verbal cues required/provided  Visit Information  Last PT Received On: 09/19/12 Assistance Needed: +1    Subjective Data  Subjective: I'm ready to get up. 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)   Prior Functioning  Home Living Lives With: Spouse Type of Home: House Home Access: Stairs to enter Secretary/administrator of Steps: 1 Entrance Stairs-Rails: None Home Layout: Two level;Able to live on main level with bedroom/bathroom Alternate Level Stairs-Number of Steps: flight Alternate Level Stairs-Rails: Right Home Adaptive  Equipment: Walker -  rolling;Raised toilet seat with rails Additional Comments: pt reports her bedroom is upstairs so she'd like to practice stairs while here however can stay on main floor if necessary Prior Function Level of Independence: Independent Vocation: Full time employment Communication Communication: No difficulties    Cognition  Cognition Arousal/Alertness: Awake/alert Behavior During Therapy: WFL for tasks assessed/performed Overall Cognitive Status: Within Functional Limits for tasks assessed    Extremity/Trunk Assessment Right Upper Extremity Assessment RUE ROM/Strength/Tone: WFL for tasks assessed Left Upper Extremity Assessment LUE ROM/Strength/Tone: WFL for tasks assessed Right Lower Extremity Assessment RLE ROM/Strength/Tone: Deficits RLE ROM/Strength/Tone Deficits: poor hip strength observed with mobility Left Lower Extremity Assessment LLE ROM/Strength/Tone: WFL for tasks assessed   Balance    End of Session PT - End of Session Activity Tolerance: Other (comment) (nausea) Patient left: in bed;with call bell/phone within reach;with family/visitor present  GP     [name],[name] 09/19/2012, 3:56 PM [name], PT, DPT 09/19/2012 Pager: [phone]

## 2012-09-20 LAB — CBC
HCT: 28.8 % — ABNORMAL LOW (ref 36.0–46.0)
Hemoglobin: 9.7 g/dL — ABNORMAL LOW (ref 12.0–15.0)
MCHC: 33.7 g/dL (ref 30.0–36.0)
MCV: 89.4 fL (ref 78.0–100.0)
RDW: 12.9 % (ref 11.5–15.5)
WBC: 8.4 10*3/uL (ref 4.0–10.5)

## 2012-09-20 LAB — BASIC METABOLIC PANEL
BUN: 11 mg/dL (ref 6–23)
Chloride: 103 mEq/L (ref 96–112)
Creatinine, Ser: 0.65 mg/dL (ref 0.50–1.10)
GFR calc Af Amer: >90 mL/min (ref >90)
Glucose, Bld: 135 mg/dL — ABNORMAL HIGH (ref 70–99)
Potassium: 3.4 mEq/L — ABNORMAL LOW (ref 3.5–5.1)

## 2012-09-20 MED ORDER — ASPIRIN 325 MG PO TABS: 325.0000 mg | ORAL_TABLET | Freq: Two times a day (BID) | ORAL | Status: DC

## 2012-09-20 MED ORDER — LEVETIRACETAM 750 MG PO TABS
1125.0000 mg | ORAL_TABLET | Freq: Every day | ORAL | Status: DC
  Administered 2012-09-21: 1125 mg via ORAL
  Filled 2012-09-20 (×2): qty 1.5

## 2012-09-20 MED ORDER — METHOCARBAMOL 500 MG PO TABS: 500.0000 mg | ORAL_TABLET | Freq: Four times a day (QID) | ORAL | Status: DC | PRN

## 2012-09-20 MED ORDER — OXYCODONE-ACETAMINOPHEN 5-325 MG PO TABS: 1.0000 | ORAL_TABLET | ORAL | Status: DC | PRN

## 2012-09-20 NOTE — Progress Notes (Signed)
Physical Therapy Treatment Patient Details Name: Darlene Ford MRN: [id number] DOB: 1956/03/05 Today's Date: 09/20/2012 Time: 1300-1320 PT Time Calculation (min): 20 min  PT Assessment / Plan / Recommendation Comments on Treatment Session  pt frustrated with care today...nursing and otherwise, concerned I would not see her twice today, have reassured pt that  I will see her  again thsi pm and we discussed practicing stairs;, pt agreeable     Follow Up Recommendations  Home health PT     Does the patient have the potential to tolerate intense rehabilitation     Barriers to Discharge        Equipment Recommendations  None recommended by PT    Recommendations for Other Services    Frequency 7X/week   Plan Discharge plan remains appropriate;Frequency remains appropriate    Precautions / Restrictions Precautions Precautions: None Restrictions RLE Weight Bearing: Weight bearing as tolerated   Pertinent Vitals/Pain     Mobility  Bed Mobility Bed Mobility: Not assessed Transfers Transfers: Sit to Stand;Stand to Sit Sit to Stand: Other (comment) (pt standing at sink wshing hair upon arrival) Stand to Sit: 5: Supervision;To bed Details for Transfer Assistance: verbal cues for safe technique including feet and hand placement Ambulation/Gait Ambulation/Gait Assistance: 4: Min guard;5: Supervision Ambulation Distance (Feet): 160 Feet Assistive device: Rolling walker Ambulation/Gait Assistance Details: verbal cues for step through, RW position Gait Pattern: Step-through pattern    Exercises Total Joint Exercises Ankle Circles/Pumps: AROM;Strengthening;Both;10 reps;Seated Long Arc Quad: AROM;Right;10 reps;Seated   PT Diagnosis:    PT Problem List:   PT Treatment Interventions:     PT Goals Acute Rehab PT Goals Time For Goal Achievement: 09/26/12 Potential to Achieve Goals: Good Pt will go Sit to Stand: with modified independence PT Goal: Sit to Stand - Progress:  Progressing toward goal Pt will go Stand to Sit: with modified independence PT Goal: Stand to Sit - Progress: Progressing toward goal Pt will Ambulate: 51 - 150 feet;with least restrictive assistive device;with modified independence PT Goal: Ambulate - Progress: Progressing toward goal Pt will Perform Home Exercise Program: with supervision, verbal cues required/provided PT Goal: Perform Home Exercise Program - Progress: Progressing toward goal  Visit Information  Last PT Received On: 09/20/12 Assistance Needed: +1    Subjective Data  Subjective: this morning has been awful 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

## 2012-09-20 NOTE — Care Management (Addendum)
Cm informed by [facility] unable to accept pt's payor.Cm spoke with patient concerning discharge planning. Per pt choice [facility] to provide [facility] services upon discharge. Cm to notify [facility] of new referral. Pt states no DME needs.  Per pt spouse to assist in home care.    Roxy Manns Sherlynn Tourville,RN,BSN [phone]

## 2012-09-20 NOTE — Progress Notes (Signed)
09/20/12 1700  PT Visit Information  Last PT Received On 09/20/12  Assistance Needed +1  PT Time Calculation  PT Start Time 1533  PT Stop Time 1600  PT Time Calculation (min) 27 min  Subjective Data  Subjective pt resting  Precautions  Precautions None  Restrictions  RLE Weight Bearing WBAT  Cognition  Arousal/Alertness Awake/alert  Behavior During Therapy WFL for tasks assessed/performed  Overall Cognitive Status Within Functional Limits for tasks assessed  Bed Mobility  Bed Mobility Supine to Sit;Sit to Supine  Supine to Sit 4: Min assist;4: Min guard  Sit to Supine 4: Min guard  Details for Bed Mobility Assistance slight assist for R LE, instructed pt how to use sheet loop  Transfers  Transfers Sit to Stand;Stand to Sit  Sit to Stand 4: Min guard;With upper extremity assist;From bed;5: Supervision  Stand to Sit 4: Min guard;5: Supervision;To bed  Details for Transfer Assistance min verbal cues for hand placement  Ambulation/Gait  Ambulation/Gait Assistance 5: Supervision  Ambulation Distance (Feet) 65 Feet  Assistive device Rolling walker  Ambulation/Gait Assistance Details cues for use of UEs for pain control  Gait Pattern Step-to pattern;Step-through pattern  Gait velocity decreased  Stairs Yes  Stairs Assistance 4: Min guard  Stair Management Technique One rail Right;Forwards;With crutches  Number of Stairs 5  Total Joint Exercises  Quad Sets AROM;Both;10 reps  Gluteal Sets AROM;Both;10 reps  Short Arc Quad AROM;Both;10 reps  Heel Slides AROM;AAROM;Right;10 reps  PT - End of Session  Equipment Utilized During Treatment Gait belt  Activity Tolerance Patient tolerated treatment well  Patient left in bed;with call bell/phone within reach  PT - Assessment/Plan  Comments on Treatment Session pt progressing well; would like to practice stairs again in am  PT Plan Discharge plan remains appropriate;Frequency remains appropriate  PT Frequency 7X/week  Follow Up  Recommendations Home health PT  PT equipment None recommended by PT  Acute Rehab PT Goals  Time For Goal Achievement 09/26/12  Potential to Achieve Goals Good  Pt will go Supine/Side to Sit with modified independence  PT Goal: Supine/Side to Sit - Progress Progressing toward goal  Pt will go Sit to Stand with modified independence  PT Goal: Sit to Stand - Progress Progressing toward goal  Pt will go Stand to Sit with modified independence  PT Goal: Stand to Sit - Progress Progressing toward goal  Pt will Ambulate 51 - 150 feet;with least restrictive assistive device;with modified independence  PT Goal: Ambulate - Progress Progressing toward goal  Pt will Perform Home Exercise Program with supervision, verbal cues required/provided  PT Goal: Perform Home Exercise Program - Progress Progressing toward goal  PT General Charges  $$ ACUTE PT VISIT 1 Procedure  PT Treatments  $Gait Training 8-22 mins  $Therapeutic Exercise 8-22 mins

## 2012-09-20 NOTE — Progress Notes (Signed)
Subjective: 1 Day Post-Op Procedure(s) (LRB): RIGHT TOTAL HIP ARTHROPLASTY ANTERIOR APPROACH (Right) Patient reports pain as mild.  Asymptomatic acute blood loss anemia.  Objective: Vital signs in last 24 hours: Temp:  [97.6 F (36.4 C)-99.3 F (37.4 C)] 98.5 F (36.9 C) (04/26 0931) Pulse Rate:  [64-83] 73 (04/26 0931) Resp:  [14-16] 14 (04/26 0931) BP: (90-127)/(39-75) 90/55 mmHg (04/26 0931) SpO2:  [96 %-100 %] 98 % (04/26 0931) Weight:  [108.863 kg (240 lb)] 108.863 kg (240 lb) (04/25 1110)  Intake/Output from previous day: 04/25 0701 - 04/26 0700 In: 5026.3 [P.O.:720; I.V.:4256.3; IV Piggyback:50] Out: 3700 [Urine:3325; Blood:375] Intake/Output this shift: Total I/O In: 240 [P.O.:240] Out: -    Recent Labs  09/20/12 0423  HGB 9.7*    Recent Labs  09/20/12 0423  WBC 8.4  RBC 3.22*  HCT 28.8*  PLT 255    Recent Labs  09/20/12 0423  NA 140  K 3.4*  CL 103  CO2 28  BUN 11  CREATININE 0.65  GLUCOSE 135*  CALCIUM 9.2   No results found for this basename: LABPT, INR,  in the last 72 hours  Sensation intact distally Intact pulses distally Dorsiflexion/Plantar flexion intact Incision: dressing C/D/I  Assessment/Plan: 1 Day Post-Op Procedure(s) (LRB): RIGHT TOTAL HIP ARTHROPLASTY ANTERIOR APPROACH (Right) Plan for discharge tomorrow  Kathryne Hitch 09/20/2012, 10:42 AM

## 2012-09-20 NOTE — Evaluation (Signed)
Occupational Therapy Evaluation Patient Details Name: Darlene Ford MRN: [id number] DOB: 1956/01/01 Today's Date: 09/20/2012 Time: 4098-1191 OT Time Calculation (min): 23 min  OT Assessment / Plan / Recommendation Clinical Impression  Pt is s/p direct anterior THA and is overall doing well with ADL. She has assist by family for ADLs and all DME. No further OT needs.     OT Assessment  Patient does not need any further OT services    Follow Up Recommendations  No OT follow up;Supervision/Assistance - 24 hour    Barriers to Discharge      Equipment Recommendations  None recommended by OT    Recommendations for Other Services    Frequency       Precautions / Restrictions Precautions Precautions: None Restrictions RLE Weight Bearing: Weight bearing as tolerated        ADL  Eating/Feeding: Simulated;Independent Where Assessed - Eating/Feeding: Bed level Grooming: Simulated;Wash/dry hands;Set up Where Assessed - Grooming: Unsupported sitting Upper Body Bathing: Simulated;Chest;Right arm;Left arm;Abdomen;Set up Where Assessed - Upper Body Bathing: Unsupported sitting Lower Body Bathing: Simulated;Minimal assistance Where Assessed - Lower Body Bathing: Supported sit to stand Upper Body Dressing: Simulated;Set up Where Assessed - Upper Body Dressing: Unsupported sitting Lower Body Dressing: Simulated;Moderate assistance Where Assessed - Lower Body Dressing: Supported sit to stand Toilet Transfer: Performed;Min Psychologist, sport and exercise: Raised toilet seat with arms (or 3-in-1 over toilet) Toileting - Clothing Manipulation and Hygiene: Simulated;Min guard Where Assessed - Engineer, mining and Hygiene: Standing Equipment Used: Rolling walker ADL Comments: Pt states she will sponge initially until she can go upstairs to access her shower stall. She has a built in Paediatric nurse. Discussed shower transfer technique for when pt is ready to shower. Pt used to  own all AE but no longer has. She is able to reach down to lower part of leg but not all the way to her R foot yet. She states she will have husband obtain AE from gift shop. She is familiar with how to use all AE. No further OT needs.     OT Diagnosis:    OT Problem List:   OT Treatment Interventions:     OT Goals    Visit Information  Last OT Received On: 09/20/12 Assistance Needed: +1    Subjective Data  Subjective: I am hurting pretty bad Patient Stated Goal: wants to do what she can for herself   Prior Functioning     Home Living Lives With: Spouse Type of Home: House Home Access: Stairs to enter [employer] of Steps: 1 Entrance Stairs-Rails: None Home Layout: Two level;Able to live on main level with bedroom/bathroom Alternate Level Stairs-Number of Steps: flight Alternate Level Stairs-Rails: Right Bathroom Shower/Tub: Tub/shower unit;Walk-in shower (downstairs is a tub; upstairs is tub and a shower stall) Bathroom Toilet: Handicapped height Home Adaptive Equipment: Walker - rolling;Bedside commode/3-in-1;Built-in shower seat Additional Comments: pt reports her bedroom is upstairs so she'd like to practice stairs while here however can stay on main floor if necessary Prior Function Level of Independence: Independent Vocation: Full time employment Communication Communication: No difficulties         Vision/Perception     Cognition  Cognition Arousal/Alertness: Awake/alert Behavior During Therapy: WFL for tasks assessed/performed Overall Cognitive Status: Within Functional Limits for tasks assessed    Extremity/Trunk Assessment Right Upper Extremity Assessment RUE ROM/Strength/Tone: [facility] for tasks assessed Left Upper Extremity Assessment LUE ROM/Strength/Tone: [facility] for tasks assessed     Mobility Bed Mobility Bed Mobility: Supine  to Sit;Sit to Supine Supine to Sit: 4: Min assist;HOB elevated Sit to Supine: 4: Min assist;HOB  elevated Transfers Transfers: Sit to Stand;Stand to Sit Sit to Stand: 4: Min guard;With upper extremity assist;From bed;From chair/3-in-1 Stand to Sit: 4: Min guard;With upper extremity assist;To chair/3-in-1;To bed Details for Transfer Assistance: min verbal cues        Balance     End of Session OT - End of Session Activity Tolerance: Patient tolerated treatment well Patient left: in bed;with call bell/phone within reach;with family/visitor present  GO     Lennox Laity [phone] 09/20/2012, 2:39 PM

## 2012-09-20 NOTE — Op Note (Signed)
NAMERAYAAN, LORAH NO.:  [id number]  MEDICAL RECORD NO.:  [id number]  LOCATION:  1607                         FACILITY:  [facility]  PHYSICIAN:  Vanita Panda. Magnus Ivan, M.D.DATE OF BIRTH:  12-26-1955  DATE OF PROCEDURE:  09/19/2012 DATE OF DISCHARGE:                              OPERATIVE REPORT   PREOPERATIVE DIAGNOSIS:  Severe end-stage arthritis and degenerative joint disease, right hip.  POSTOPERATIVE DIAGNOSIS:  Severe end-stage arthritis and degenerative joint disease, right hip.  PROCEDURE:  Right total hip arthroplasty through direct anterior approach.  IMPLANTS:  DePuy Sector Gription acetabular component size 50, apex hole eliminator guide, size 32+, 4 neutral polyethylene liner, size 11 Corail femoral component with standard offset (KA), size 32+, 1 ceramic hip ball.  SURGEON:  Vanita Panda. Magnus Ivan, M.D.  ASSISTANT:  Richardean Canal, P.A.  ANESTHESIA:  General.  ESTIMATED BLOOD LOSS:  Around 350-500 mL.  COMPLICATIONS:  None.  INDICATIONS:  Ms. Vecchiarelli is a 57 year old female with severe end-stage arthritis involving her right hip.  She has had a history of her previous left total hip arthroplasty in 2005.  Her right hip has been severely painful especially for the last 6 months, but even longer than that.  She says where she cannot even get around anymore without it severely hurting, quite significantly her quality of life has been severely diminished, her mobility is diminished.  Her pain is daily with all of activities.  She even has night pain.  X-rays of her hip show severe end-stage arthritis of the right hip.  She has complete loss of her joint space.  Periarticular osteophytes on both the acetabulum and femoral head.  She has got subchondral sclerosis and subchondral cystic changes.  Given the failure of conservative treatment.  She wished to proceed with a total hip arthroplasty.  The risks and benefits of this have been  explained to her in detail, and she does wish to proceed.  PROCEDURE DESCRIPTION:  After informed consent was obtained, appropriate right hip was marked.  She was brought to the operating room and general anesthesia was obtained while she was on her stretcher.  A Foley catheter was placed and then traction boots placed on both of her feet. Next, she was placed supine on the Hana fracture table with the perineal post in place and both legs inline skeletal traction, but no traction applied.  Her right operative hip was then prepped and draped with DuraPrep and sterile drapes.  Time-out was called to identify correct patient, correct right hip.  I then made an incision just posterior and inferior to the anterior superior iliac spine and carried this obliquely down the leg.  I dissected down to the tensor fascia lata and the tensor fascia was divided longitudinally.  I proceeded with a direct anterior approach to the hip.  A Cobra retractor was placed around the lateral neck and up underneath the rectus femoris medial, a Cobra retractor was placed medially.  I exposed the hip capsule.  I cauterized the lateral femoral circumflex vessels and then opened up the hip capsule in an L type format.  There was a large effusion encountered and you could see right away the femoral head was devoid  of cartilage with significant arthritic changes.  I placed the Cobra retractors within the hip capsule and then made my femoral neck cut just proximal to the lesser trochanter with an oscillating saw and completed this with an osteotome.  I removed the femoral head in its entirety and cleaned the acetabulum and debrided.  This concluded the remnants of the acetabular labrum. Hohmann retractor was placed medially and a Cobra retractor laterally. I then began reaming in 2 mm increments from size 42 all the way up to a size 50, all reamers were placed under direct visualization and the last 2 reamers under direct  fluoroscopy.  Once I was pleased with my depth of reaming my inclination and anteversion.  I placed the real DePuy Sector Gription acetabular component under direct visualization and fluoroscopy, so I could obtain my inclination and anteversion, and then I placed the real 32+ 4 neutral polyethylene liner.  Attention was then turned to the femur with the leg externally rotated to 90 degrees, extended and adducted.  I placed a Mueller retractor medially and along the Cobra retractor behind the greater trochanter.  I released the lateral joint capsule and brought this anteriorly.  I then used a rongeur and a box cutting guide to open up the femoral canal.  I began broaching from a size 8 Corail femoral component broach followed up to 11, the 11 was felt to be stable and secure fit.  So, I trialed a standard neck and a 32+ 1 hip ball.  We brought the leg back over and up with traction and internal rotation, reduced the pelvis and she was stable with internal and external rotation.  No shuck and her leg lengths were measured equal under direct fluoroscopy.  This was definitely important because we started off preoperative with her significantly short on the right, comparing the right to the left side which was longer.  We then dislocated the hip and removed the trial components.  I placed the real Corail femoral component, size 11 with standard offset and a real 32+ 1 ceramic hip ball.  Again, we brought the leg back over and up and with traction internal rotation, reduced it back into the pelvis and it was stable.  We then copiously irrigated the soft tissues with normal saline solution using pulsatile lavage.  We closed the joint capsule with interrupted #1 Ethibond suture followed by running #1 Vicryl in the tensor fascia, 0 Vicryl in the deep tissue, 2-0 Vicryl in subcutaneous tissue, 4-0 Monocryl subcuticular stitch and Dermabond on the skin.  A well-padded Aquacel dressing was applied.   She was taken off the Hana table, awakened, extubated, taken to the room in stable condition.  All final counts correct.  There were no complications noted.     Vanita Panda. Magnus Ivan, M.D.     CYB/MEDQ  D:  09/19/2012  T:  09/20/2012  Job:  [id number]

## 2012-09-21 LAB — CBC
HCT: 29.2 % — ABNORMAL LOW (ref 36.0–46.0)
Hemoglobin: 9.7 g/dL — ABNORMAL LOW (ref 12.0–15.0)
MCH: 30 pg (ref 26.0–34.0)
MCHC: 33.2 g/dL (ref 30.0–36.0)
RDW: 13.3 % (ref 11.5–15.5)

## 2012-09-21 NOTE — Progress Notes (Signed)
Discharged from floor via w/c, spouse with pt. No changes in assessment. Darlene Ford   

## 2012-09-21 NOTE — Care Management Note (Signed)
    Page 1 of 2   09/21/2012     1:27:35 PM   CARE MANAGEMENT NOTE 09/21/2012  Patient:  YARITSA, SAVARINO   Account Number:  [id number]  Date Initiated:  09/20/2012  Documentation initiated by:  DAVIS,TYMEEKA  Subjective/Objective Assessment:   57 yo female admitted with degenerative arthritis of hip.     Action/Plan:   Home when stable   Anticipated DC Date:  09/21/2012   Anticipated DC Plan:  HOME W HOME HEALTH SERVICES      DC Planning Services  CM consult      Choice offered to / List presented to:  [name]        HH arranged  HH-2 PT      [facility] agency  [facility]   Status of service:  Completed, signed off Medicare Important Message given?   (If response is "NO", the following Medicare IM given date fields will be blank) Date Medicare IM given:   Date Additional Medicare IM given:    Discharge Disposition:  HOME W HOME HEALTH SERVICES  Per UR Regulation:    If discussed at Long Length of Stay Meetings, dates discussed:    Comments:  09/21/12 Alazia Crocket RN,BSN NCM WEEKEND [phone] [facility] CHOSEN FOR HHPT ORDERED.TC [facility] INTAKE [phone] SPOKE TO BARBARA INFORMED OF D/C HOME TODAY,& HHPT ORDER.  09/20/12 1736 Roxy Manns Davis,RN,BSN [phone] Cm informed by Genevieve Norlander unable to accept pt's payor.Cm spoke with patient concerning discharge planning. Per pt choice [facility] to provide [facility] services upon discharge. Cm to notify [facility] of new referral. Pt states no DME needs.  Per pt spouse to assist in home care.

## 2012-09-21 NOTE — Progress Notes (Signed)
Patient ID: Darlene Ford, female   DOB: 1956/02/22, 57 y.o.   MRN: [id number] PATIENT ID: Darlene Ford        MRN:  [id number]          DOB/AGE: 1955/11/29 / 57 y.o.    Darlene Campbell, MD   Jacqualine Code, PA-C [address] [place], [place]  [address]                             [phone]   PROGRESS NOTE  Subjective:  negative for Chest Pain  negative for Shortness of Breath  negative for Nausea/Vomiting   negative for Calf Pain    Tolerating Diet: yes         Patient reports pain as mild.     No BM yet.  Uses Miralax daily at home  Objective: Vital signs in last 24 hours:   Patient Vitals for the past 24 hrs:  BP Temp Temp src Pulse Resp SpO2  09/21/12 0458 119/77 mmHg 98.5 F (36.9 C) Oral 90 18 98 %  09/20/12 2240 110/66 mmHg 99 F (37.2 C) Oral 74 16 100 %  09/20/12 2000 - - - - 16 98 %  09/20/12 1600 - - - - 14 100 %  09/20/12 1433 97/38 mmHg 99.2 F (37.3 C) Oral 75 14 100 %  09/20/12 1200 - - - - 14 98 %      Intake/Output from previous day:   04/26 0701 - 04/27 0700 In: 1529.2 [P.O.:480; I.V.:1049.2] Out: 750 [Urine:750]   Intake/Output this shift:       Intake/Output     04/26 0701 - 04/27 0700 04/27 0701 - 04/28 0700   P.O. 480    I.V. (mL/kg) 1049.2 (9.6)    IV Piggyback     Total Intake(mL/kg) 1529.2 (14)    Urine (mL/kg/hr) 750 (0.3)    Blood     Total Output 750     Net +779.2          Urine Occurrence 1 x       LABORATORY DATA:  Recent Labs  09/15/12 0900 09/20/12 0423 09/21/12 0508  WBC 7.2 8.4 8.5  HGB 13.2 9.7* 9.7*  HCT 39.7 28.8* 29.2*  PLT 328 255 240    Recent Labs  09/15/12 0900 09/20/12 0423  NA 140 140  K 4.0 3.4*  CL 103 103  CO2 31 28  BUN 15 11  CREATININE 0.67 0.65  GLUCOSE 108* 135*  CALCIUM 9.7 9.2   Lab Results  Component Value Date   INR 0.90 09/15/2012    Recent Radiographic Studies :  Dg Hip Complete Right  09/19/2012  *RADIOLOGY REPORT*  Clinical Data: Anterior right hip  replacement.  RIGHT HIP - COMPLETE 2+ VIEW  Comparison: None.  Findings: Changes of right hip replacement.  Normal AP alignment. No hardware complicating feature.  There is a linear radiopaque density within the medial soft tissues.  Cannot exclude retained needle are other foreign body. This may be external to the patient.  Recommend clinical correlation.  IMPRESSION: Right hip replacement.  Normal AP alignment.  Linear radiopaque density projects over the medial soft tissues, possibly external to the patient or soft tissue radiopaque foreign body/needle.  Recommend clinical correlation.   Original Report Authenticated By: Charlett Nose, M.D.    Dg Pelvis Portable  09/19/2012  *RADIOLOGY REPORT*  Clinical Data: Right hip replacement.  PORTABLE PELVIS  Comparison: C-arm  images obtained earlier today.  Findings: Bilateral total hip prostheses in satisfactory position and alignment.  No acute fracture or dislocation.  Old left lateral superior acetabular and lesser trochanter fractures.  The linear metallic density seen on the right earlier today is no longer demonstrated.  IMPRESSION: Bilateral total hip prostheses in satisfactory position and alignment.   Original Report Authenticated By: Beckie Salts, M.D.    Dg Hip Portable 1 View Right  09/19/2012  *RADIOLOGY REPORT*  Clinical Data: Right hip replacement.  PORTABLE RIGHT HIP - 1 VIEW  Comparison: Portable pelvis obtained at the same time.  Findings: Previously noted bilateral total hip prostheses.  No fracture or dislocation visible on this image.  IMPRESSION: Right total hip prosthesis without complication.   Original Report Authenticated By: Beckie Salts, M.D.    Dg C-arm 1-60 Min-no Report  09/19/2012  CLINICAL DATA: Right anterior hip   C-ARM 1-60 MINUTES  Fluoroscopy was utilized by the requesting physician.  No radiographic  interpretation.       Examination:  General appearance: alert, cooperative and mild distress  Wound Exam: clean, dry,  intact dressing  Drainage:  None: wound tissue dry  Motor Exam: EHL, FHL, Anterior Tibial and Posterior Tibial Intact  Sensory Exam: Superficial Peroneal, Deep Peroneal and Tibial normal  Vascular Exam: Right posterior tibial artery has 1+ (weak) pulse  Assessment:    2 Days Post-Op  Procedure(s) (LRB): RIGHT TOTAL HIP ARTHROPLASTY ANTERIOR APPROACH (Right)  ADDITIONAL DIAGNOSIS:  Principal Problem:   Degenerative arthritis of hip  Acute Blood Loss Anemia - asymptomatic   Plan: Physical Therapy as ordered Weight Bearing as Tolerated (WBAT)  DVT Prophylaxis:  Aspirin  DISCHARGE PLAN: Home  DISCHARGE NEEDS: HHPT, Walker and 3-in-1 comode seat  Desires discharge home         [facility] 09/21/2012 11:25 AM

## 2012-09-21 NOTE — Discharge Summary (Signed)
[name], MD   Jacqualine Code, PA-C [address] [place], [place]  [address]                             [phone]  PATIENT ID: Darlene Ford        MRN:  [id number]          DOB/AGE: Jul 18, 1955 / 57 y.o.    DISCHARGE SUMMARY  ADMISSION DATE:    09/19/2012 DISCHARGE DATE:   09/21/2012   ADMISSION DIAGNOSIS: Severe osteoarthritis right hip    DISCHARGE DIAGNOSIS:  Severe osteoarthritis right hip    ADDITIONAL DIAGNOSIS: Principal Problem:   Degenerative arthritis of hip  Past Medical History  Diagnosis Date  . Complication of anesthesia     severe tremors "shakes"  . Hyperlipidemia   . Epilepsy     last seizure 4 yrs ago  . Arthritis   . Diverticulitis   . History of transfusion   . Hypothyroidism     PROCEDURE: Procedure(s): RIGHT TOTAL HIP ARTHROPLASTY ANTERIOR APPROACH Righton 09/19/2012  CONSULTS: none     HISTORY: Darlene Ford, 57 y.o. female, has a history of pain and functional disability in the right hip(s) due to arthritis and patient has failed non-surgical conservative treatments for greater than 12 weeks to include NSAID's and/or analgesics, use of assistive devices, weight reduction as appropriate and activity modification. Onset of symptoms was gradual starting 3 years ago with gradually worsening course since that time.The patient noted no past surgery on the right hip(s). Patient currently rates pain in the right hip at 8 out of 10 with activity. Patient has night pain, worsening of pain with activity and weight bearing, trendelenberg gait, pain that interfers with activities of daily living, pain with passive range of motion and crepitus. Patient has evidence of subchondral cysts, subchondral sclerosis, periarticular osteophytes and joint space narrowing by imaging studies. This condition presents safety issues increasing the risk of falls. There is no current active infection.   HOSPITAL COURSE:  Darlene Ford is a 57 y.o.  admitted on 09/19/2012 and found to have a diagnosis of Severe osteoarthritis right hip.  After appropriate laboratory studies were obtained  they were taken to the operating room on 09/19/2012 and underwent  Procedure(s): RIGHT TOTAL HIP ARTHROPLASTY ANTERIOR APPROACH Right.   They were given perioperative antibiotics:  Anti-infectives   Start     Dose/Rate Route Frequency Ordered Stop   09/19/12 1400  ceFAZolin (ANCEF) IVPB 1 g/50 mL premix     1 g 100 mL/hr over 30 Minutes Intravenous Every 6 hours 09/19/12 1117 09/19/12 2049   09/19/12 0528  ceFAZolin (ANCEF) IVPB 2 g/50 mL premix     2 g 100 mL/hr over 30 Minutes Intravenous On call to O.R. 09/19/12 2130 09/19/12 0730    .  Tolerated the procedure well.  Some nausea post op. OOB day of surgery  POD #1, allowed out of bed to a chair.  PT for ambulation and exercise program.  POD #2, continued PT and ambulation.  Had an unremarkable hospital course .  Tolerated ambulation well.  Pain meds adequate . The remainder of the hospital course was dedicated to ambulation and strengthening.   The patient was discharged on 2 Days Post-Op in  Stable condition.  Blood products given:none  DIAGNOSTIC STUDIES: Recent vital signs: Patient Vitals for the past 24 hrs:  BP Temp Temp src Pulse Resp SpO2  09/21/12 0458  119/77 mmHg 98.5 F (36.9 C) Oral 90 18 98 %  09/20/12 2240 110/66 mmHg 99 F (37.2 C) Oral 74 16 100 %  09/20/12 2000 - - - - 16 98 %  09/20/12 1600 - - - - 14 100 %  09/20/12 1433 97/38 mmHg 99.2 F (37.3 C) Oral 75 14 100 %  09/20/12 1200 - - - - 14 98 %       Recent laboratory studies:  Recent Labs  09/15/12 0900 09/20/12 0423 09/21/12 0508  WBC 7.2 8.4 8.5  HGB 13.2 9.7* 9.7*  HCT 39.7 28.8* 29.2*  PLT 328 255 240    Recent Labs  09/15/12 0900 09/20/12 0423  NA 140 140  K 4.0 3.4*  CL 103 103  CO2 31 28  BUN 15 11  CREATININE 0.67 0.65  GLUCOSE 108* 135*  CALCIUM 9.7 9.2   Lab Results  Component Value  Date   INR 0.90 09/15/2012     Recent Radiographic Studies :  Dg Hip Complete Right  09/19/2012  *RADIOLOGY REPORT*  Clinical Data: Anterior right hip replacement.  RIGHT HIP - COMPLETE 2+ VIEW  Comparison: None.  Findings: Changes of right hip replacement.  Normal AP alignment. No hardware complicating feature.  There is a linear radiopaque density within the medial soft tissues.  Cannot exclude retained needle are other foreign body. This may be external to the patient.  Recommend clinical correlation.  IMPRESSION: Right hip replacement.  Normal AP alignment.  Linear radiopaque density projects over the medial soft tissues, possibly external to the patient or soft tissue radiopaque foreign body/needle.  Recommend clinical correlation.   Original Report Authenticated By: Charlett Nose, M.D.    Dg Pelvis Portable  09/19/2012  *RADIOLOGY REPORT*  Clinical Data: Right hip replacement.  PORTABLE PELVIS  Comparison: C-arm images obtained earlier today.  Findings: Bilateral total hip prostheses in satisfactory position and alignment.  No acute fracture or dislocation.  Old left lateral superior acetabular and lesser trochanter fractures.  The linear metallic density seen on the right earlier today is no longer demonstrated.  IMPRESSION: Bilateral total hip prostheses in satisfactory position and alignment.   Original Report Authenticated By: Beckie Salts, M.D.    Dg Hip Portable 1 View Right  09/19/2012  *RADIOLOGY REPORT*  Clinical Data: Right hip replacement.  PORTABLE RIGHT HIP - 1 VIEW  Comparison: Portable pelvis obtained at the same time.  Findings: Previously noted bilateral total hip prostheses.  No fracture or dislocation visible on this image.  IMPRESSION: Right total hip prosthesis without complication.   Original Report Authenticated By: Beckie Salts, M.D.    Dg C-arm 1-60 Min-no Report  09/19/2012  CLINICAL DATA: Right anterior hip   C-ARM 1-60 MINUTES  Fluoroscopy was utilized by the requesting  physician.  No radiographic  interpretation.      DISCHARGE INSTRUCTIONS: Discharge Orders   Future Orders Complete By Expires     Call MD / Call 911  As directed     Comments:      If you experience chest pain or shortness of breath, CALL 911 and be transported to the hospital emergency room.  If you develope a fever above 101 F, pus (white drainage) or increased drainage or redness at the wound, or calf pain, call your surgeon's office.    Constipation Prevention  As directed     Comments:      Drink plenty of fluids.  Prune juice may be helpful.  You may use a  stool softener, such as Colace (over the counter) 100 mg twice a day.  Use MiraLax (over the counter) for constipation as needed.    Diet - low sodium heart healthy  As directed     Discharge instructions  As directed     Comments:      Increase your activities as comfort allows. Ice as needed. Expect thigh and knee pain and leg/foot swelling. Wait until 09/24/12 to remove your dressing and get your incision wet; then dry dressing daily.    Increase activity slowly as tolerated  As directed        DISCHARGE MEDICATIONS:     Medication List    TAKE these medications       aspirin 325 MG tablet  Take 1 tablet (325 mg total) by mouth 2 (two) times daily after a meal.     lacosamide 200 MG Tabs  Commonly known as:  VIMPAT  Take 200 mg by mouth 2 (two) times daily.     levETIRAcetam 750 MG tablet  Commonly known as:  KEPPRA  Take 1,125-1,500 mg by mouth every 12 (twelve) hours. Takes 1 and 1/2 tablet in the morning and 2 tablets at night     levothyroxine 137 MCG tablet  Commonly known as:  SYNTHROID, LEVOTHROID  Take 68.5 mcg by mouth daily before breakfast. Takes 1/2 tablet     methocarbamol 500 MG tablet  Commonly known as:  ROBAXIN  Take 1 tablet (500 mg total) by mouth every 6 (six) hours as needed.     metoprolol succinate 50 MG 24 hr tablet  Commonly known as:  TOPROL-XL  Take 50 mg by mouth every evening.  Take with or immediately following a meal.     multivitamin with minerals Tabs  Take 1 tablet by mouth daily.     omega-3 acid ethyl esters 1 G capsule  Commonly known as:  LOVAZA  Take 2 g by mouth 2 (two) times daily.     oxyCODONE-acetaminophen 5-325 MG per tablet  Commonly known as:  ROXICET  Take 1-2 tablets by mouth every 4 (four) hours as needed for pain.     polyethylene glycol packet  Commonly known as:  MIRALAX / GLYCOLAX  Take 17 g by mouth daily.     rosuvastatin 5 MG tablet  Commonly known as:  CRESTOR  Take 5 mg by mouth daily before breakfast.     triamterene-hydrochlorothiazide 37.5-25 MG per capsule  Commonly known as:  DYAZIDE  Take 1 capsule by mouth daily before breakfast.        FOLLOW UP VISIT:       Follow-up Information   Follow up with Kathryne Hitch, MD In 2 weeks.   Contact information:   [address] [place] [address] [phone]       DISPOSITION:   Home  CONDITION:  Stable   Darlene Ford,Darlene Ford 09/21/2012, 11:29 AM

## 2012-09-21 NOTE — Progress Notes (Signed)
Physical Therapy Treatment Patient Details Name: Darlene Ford MRN: [id number] DOB: November 10, 1955 Today's Date: 09/21/2012 Time: 5621-3086 PT Time Calculation (min): 17 min  PT Assessment / Plan / Recommendation Comments on Treatment Session  doing great; feels good about D/C today form PT standpoint; crutches adjusted for use on stairs at home; discussed/ car transfer technique, pta nd husband verbalize    Follow Up Recommendations  Home health PT     Does the patient have the potential to tolerate intense rehabilitation     Barriers to Discharge        Equipment Recommendations  None recommended by PT    Recommendations for Other Services    Frequency 7X/week   Plan Discharge plan remains appropriate;Frequency remains appropriate    Precautions / Restrictions Precautions Precautions: None Restrictions RLE Weight Bearing: Weight bearing as tolerated   Pertinent Vitals/Pain C/o pain, RN notified    Mobility  Bed Mobility Bed Mobility: Not assessed Transfers Transfers: Sit to Stand;Stand to Sit Sit to Stand: 6: Modified independent (Device/Increase time) Stand to Sit: 6: Modified independent (Device/Increase time) Ambulation/Gait Ambulation/Gait Assistance: 5: Supervision;6: Modified independent (Device/Increase time) Ambulation Distance (Feet): 80 Feet Assistive device: Rolling walker Ambulation/Gait Assistance Details: intial cues step through Gait Pattern: Step-through pattern General Gait Details: good gait stability today Stairs: Yes Stairs Assistance: 4: Min guard;5: Supervision Stairs Assistance Details (indicate cue type and reason): cues for sequence; husband educated Stair Management Technique: One rail Right;Forwards;With crutches Number of Stairs: 3    Exercises     PT Diagnosis:    PT Problem List:   PT Treatment Interventions:     PT Goals Acute Rehab PT Goals Time For Goal Achievement: 09/26/12 Potential to Achieve Goals: Good Pt will go  Sit to Stand: with modified independence PT Goal: Sit to Stand - Progress: Met Pt will go Stand to Sit: with modified independence PT Goal: Stand to Sit - Progress: Met Pt will Ambulate: 51 - 150 feet;with least restrictive assistive device;with modified independence PT Goal: Ambulate - Progress: Met Pt will Go Up / Down Stairs: 3-5 stairs;with supervision;with rail(s);with least restrictive assistive device PT Goal: Up/Down Stairs - Progress: Met  Visit Information  Last PT Received On: 09/21/12 Assistance Needed: +1    Subjective Data  Subjective: I am ready 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

## 2013-01-21 ENCOUNTER — Encounter: Payer: Self-pay | Admitting: Internal Medicine

## 2013-01-21 ENCOUNTER — Ambulatory Visit (INDEPENDENT_AMBULATORY_CARE_PROVIDER_SITE_OTHER): Payer: 59 | Admitting: Internal Medicine

## 2013-01-21 VITALS — BP 110/70 | HR 57 | Temp 98.2°F | Ht 63.25 in | Wt 227.2 lb

## 2013-01-21 DIAGNOSIS — M169 Osteoarthritis of hip, unspecified: Secondary | ICD-10-CM

## 2013-01-21 DIAGNOSIS — K5790 Diverticulosis of intestine, part unspecified, without perforation or abscess without bleeding: Secondary | ICD-10-CM

## 2013-01-21 DIAGNOSIS — G43909 Migraine, unspecified, not intractable, without status migrainosus: Secondary | ICD-10-CM

## 2013-01-21 DIAGNOSIS — E559 Vitamin D deficiency, unspecified: Secondary | ICD-10-CM

## 2013-01-21 DIAGNOSIS — K5730 Diverticulosis of large intestine without perforation or abscess without bleeding: Secondary | ICD-10-CM

## 2013-01-21 DIAGNOSIS — E7800 Pure hypercholesterolemia, unspecified: Secondary | ICD-10-CM

## 2013-01-21 DIAGNOSIS — G40909 Epilepsy, unspecified, not intractable, without status epilepticus: Secondary | ICD-10-CM

## 2013-01-21 DIAGNOSIS — M1610 Unilateral primary osteoarthritis, unspecified hip: Secondary | ICD-10-CM

## 2013-01-21 DIAGNOSIS — E039 Hypothyroidism, unspecified: Secondary | ICD-10-CM | POA: Insufficient documentation

## 2013-01-21 LAB — CBC WITH DIFFERENTIAL/PLATELET
Eosinophils Relative: 2 % (ref 0.0–5.0)
Monocytes Absolute: 0.4 10*3/uL (ref 0.1–1.0)
Monocytes Relative: 6.7 % (ref 3.0–12.0)
Neutrophils Relative %: 66.5 % (ref 43.0–77.0)
Platelets: 305 10*3/uL (ref 150.0–400.0)
WBC: 6.6 10*3/uL (ref 4.5–10.5)

## 2013-01-21 NOTE — Patient Instructions (Addendum)
Dr DeFrancesco ([facility]) and Dr Janene Harvey [facility])

## 2013-01-22 LAB — COMPREHENSIVE METABOLIC PANEL
ALT: 16 U/L (ref 0–35)
CO2: 31 mEq/L (ref 19–32)
Chloride: 104 mEq/L (ref 96–112)
GFR: 77.1 mL/min (ref >60.00)
Sodium: 139 mEq/L (ref 135–145)
Total Bilirubin: 0.6 mg/dL (ref 0.3–1.2)
Total Protein: 7.3 g/dL (ref 6.0–8.3)

## 2013-01-22 LAB — LIPID PANEL
HDL: 39.1 mg/dL (ref >39.00)
LDL Cholesterol: 54 mg/dL (ref 0–99)
Total CHOL/HDL Ratio: 3

## 2013-01-22 LAB — VITAMIN D 25 HYDROXY (VIT D DEFICIENCY, FRACTURES): Vit D, 25-Hydroxy: 45 ng/mL (ref 30–89)

## 2013-01-26 ENCOUNTER — Encounter: Payer: Self-pay | Admitting: Internal Medicine

## 2013-01-26 DIAGNOSIS — K5790 Diverticulosis of intestine, part unspecified, without perforation or abscess without bleeding: Secondary | ICD-10-CM | POA: Insufficient documentation

## 2013-01-26 DIAGNOSIS — G43909 Migraine, unspecified, not intractable, without status migrainosus: Secondary | ICD-10-CM | POA: Insufficient documentation

## 2013-01-26 NOTE — Assessment & Plan Note (Signed)
Followed by neurology.  Maintained on Keppra.  Follow.  No recent seizures.   

## 2013-01-26 NOTE — Assessment & Plan Note (Signed)
Stopped with menopause.   

## 2013-01-26 NOTE — Assessment & Plan Note (Signed)
Intermittent diverticular flares.  Previous colon perforation during last colonoscopy.  S/p partial colon resection.  Bowels stable.  Doing well now.    

## 2013-01-26 NOTE — Assessment & Plan Note (Signed)
S/p right hip surgery.  Doing well.  Follow.    

## 2013-01-26 NOTE — Assessment & Plan Note (Signed)
Low cholesterol diet and exercise.  On crestor.  Check lipid panel and liver function.    

## 2013-01-26 NOTE — Progress Notes (Signed)
Subjective:    Patient ID: Darlene Ford, female    DOB: 1956/05/08, 57 y.o.   MRN: [id number]  HPI 57 year old female with past history of arthritis, diverticulosis and diverticulitis, hypercholesterolemia, hypothyroidism and seizure disorder.  She comes in today to follow up on these issues as well as to establish care.  Previously seeing Dr Kenna Gilbert [facility]).  Also is followed by Dr Sherlean Foot [facility] neurology.  Moved here in 2005.  Previously worked as a Retail banker.  She has known diverticulosis and suffered a perforation after a colonoscopy in 2010.  Had a partial colon resection with 8-10 inches of her colon removed.  Occasionally will have diverticular flares.  Previously had C. Diff infection.  Bowels stable now.  Eating and drinking well.  No nausea or vomiting.  Had right hip surgery in 4/14.  She is back to golfing and walking.  She is followed by neurology for her seizures.  Stable.  Sees them 2x/year.  She previously had an extensive cardiac w/up.  Had heart cath approximately five years ago that was normal.  Previously had a history of migraine headaches, but these stopped with menopause.     Past Medical History  Diagnosis Date  . Complication of anesthesia     severe tremors "shakes"  . Hyperlipidemia   . Epilepsy     last seizure 4 yrs ago  . Arthritis   . Diverticulitis   . History of transfusion   . Hypothyroidism   . Hx of migraines     stopped with menopause    Outpatient Encounter Prescriptions as of 01/21/2013  Medication Sig Dispense Refill  . aspirin 325 MG tablet Take 1 tablet (325 mg total) by mouth 2 (two) times daily after a meal.  60 tablet  0  . clonazePAM (KLONOPIN) 0.5 MG tablet Take 0.5 mg by mouth 2 (two) times daily as needed for anxiety.      Marland Kitchen lacosamide (VIMPAT) 200 MG TABS Take 200 mg by mouth 2 (two) times daily.      Marland Kitchen levETIRAcetam (KEPPRA) 750 MG tablet Take 1,125-1,500 mg by mouth every 12 (twelve) hours. Takes 1 and 1/2 tablet  in the morning and 2 tablets at night      . levothyroxine (SYNTHROID, LEVOTHROID) 137 MCG tablet Take 68.5 mcg by mouth daily before breakfast. Takes 1/2 tablet      . metoprolol succinate (TOPROL-XL) 50 MG 24 hr tablet Take 50 mg by mouth every evening. Take with or immediately following a meal.      . Multiple Vitamin (MULTIVITAMIN WITH MINERALS) TABS Take 1 tablet by mouth daily.      Marland Kitchen omega-3 acid ethyl esters (LOVAZA) 1 G capsule Take 2 g by mouth 2 (two) times daily.      . polyethylene glycol (MIRALAX / GLYCOLAX) packet Take 17 g by mouth daily.      . rosuvastatin (CRESTOR) 5 MG tablet Take 5 mg by mouth daily before breakfast.      . triamterene-hydrochlorothiazide (DYAZIDE) 37.5-25 MG per capsule Take 1 capsule by mouth daily before breakfast.      . [DISCONTINUED] methocarbamol (ROBAXIN) 500 MG tablet Take 1 tablet (500 mg total) by mouth every 6 (six) hours as needed.  60 tablet  0  . [DISCONTINUED] oxyCODONE-acetaminophen (ROXICET) 5-325 MG per tablet Take 1-2 tablets by mouth every 4 (four) hours as needed for pain.  60 tablet  0   No facility-administered encounter medications on file as  of 01/21/2013.    Review of Systems Patient denies any headache, lightheadedness or dizziness.  No sinus or allergy symptoms.   No chest pain, tightness or palpitations.  No increased shortness of breath, cough or congestion.  No nausea or vomiting.  No acid reflux.  No abdominal pain or cramping.  No bowel change, such as diarrhea, constipation, BRBPR or melana.  No urine change.  Overall feels things are stable.  No seizures.        Objective:   Physical Exam Filed Vitals:   01/21/13 0823  BP: 110/70  Pulse: 57  Temp: 98.2 F (53.9 C)   57 year old female in no acute distress.   HEENT:  Nares- clear.  Oropharynx - without lesions. NECK:  Supple.  Nontender.  No audible bruit.  HEART:  Appears to be regular. LUNGS:  No crackles or wheezing audible.  Respirations even and unlabored.   RADIAL PULSE:  Equal bilaterally.  ABDOMEN:  Soft, nontender.  Bowel sounds present and normal.  No audible abdominal bruit.   EXTREMITIES:  No increased edema present.  DP pulses palpable and equal bilaterally.          Assessment & Plan:  HEALTH MAINTENANCE. Plans to get her breast and pelvic exams through gyn.  Keep up to date with mammograms.  Last colonoscopy 2010.  Obtain records.    I spent 45 minutes with the patient and more than 50% of the time was spent in consultation regarding the above.

## 2013-01-26 NOTE — Assessment & Plan Note (Signed)
On levothyroxine.  Follow tsh.  

## 2013-01-27 ENCOUNTER — Encounter: Payer: Self-pay | Admitting: *Deleted

## 2013-01-27 NOTE — Progress Notes (Signed)
 Chief Complaint: seizures.     History of Present Illness:    Please see Dr. Bettyann initial note dated March 31, 2008 and previous notes for full details. Briefly, the patient is a 57 year old, right-handed Caucasian female with a history of migraines who started having auras consisting of a rising feeling in her stomach going up to her mouth with her mouth feeling funny for 10 to 15 seconds. Afterwards, this would resolve. These often occurred in clusters but were fairly infrequent. In August 2009, she had a convulsive seizure preceded by such an episode, and then in February 2010 she had another episode.   Her first medication was Levetiracetam  but it made her moody so they tried several other medications and had side effects.She was switched back to Levetiracetam  but then had another episode, and therefore, she was put on Vimpat . She continues to have these auras along with some left arm heaviness. We have gradually increased her medication. She also had some episodes where she would have funny feelings followed by clamping down of her teeth, her neck arching back, and her screaming for help along with the left arm sensation. This would last for a few minutes but often up to 20 minutes. Her medications were increased but we had been asking repeatedly for her to come in for a video EEG.   In March 2012, she had an episode where she slumped to the left side of her chair, her eyes closed and she was able to speak in a slow voice. She had some difficulty getting words out but kept speaking and answered appropriately. Her vital signs were stable although her blood pressure was high. Because of the episode we decided again to try for video EEG, but again she was unable to come in. Since that time, she has continued on Vimpat  and Levetiracetam . She says that she eventually noticed that she always had problems when her Synthroid  has been increased. She of her own accord reduced her Synthroid  by half and since then she  reports that she has felt much better. Since that time she has not had any episodes.  At her last appointment, we tried decreasing her Vimpat  to 150 mg bid but she started getting her auras which are nervous feelings that come over her. She went back up to 200 mg bid. Her Levetiracetam  is 750 mg, 1.5 tabs twice daily. Otherwise she has been doing well. She feels like she tolerates her medication well. She does notice that she has some mild balance problems after she takes her medication.     Past Medical History:     Past Medical History   Diagnosis Date   . Seizures    . Hypertension    . Hypothyroid    . Sinus tachycardia    . Migraine headache    . Adrenal mass      Past Surgical History   Procedure Laterality Date   . Laminectomy lumbar spine         Medications:     Current Outpatient Prescriptions   Medication Sig Dispense Refill   . aspirin  81 mg tablet 1 tab by mouth daily       . clonazePAM  (KLONOPIN ) 0.5 MG tablet 1 tab by mouth as needed as needed       . CRESTOR  5 mg tablet Take 1 tablet by mouth Daily.       . lacosamide  (VIMPAT ) 200 mg tablet Take 1 tablet (200 mg total) by mouth 2 (two) times daily.  65 tablet  5   . levETIRAcetam  (KEPPRA ) 750 MG tablet Take 2 tablets (1,500 mg total) by mouth 2 (two) times daily.  120 tablet  6   . levothyroxine  (SYNTHROID , LEVOTHROID) 100 MCG tablet 1 tab by mouth daily       . LOVAZA 1 gram capsule Take 2 tablets by mouth Twice daily.       . metoprolol  (TOPROL -XL) 50 MG XL tablet 1 tab by mouth daily       . triamterene -hydrochlorothiazide  (DYAZIDE ) 37.5-25 mg capsule 1 cap by mouth daily         No current facility-administered medications for this visit.       Allergies:  None.     Physical Examination:    Filed Vitals:    01/27/13 0928   BP: 118/70   Pulse: 64   Height: 161.3 cm (5' 3.5)   Weight: 100.699 kg (222 lb)     The patient was fully alert and oriented, her language was normal, eye movements were intact. Her face was symmetric. There was no dysmetria,  there was no drift. Her gait was normal.     Laboratory Data: pending    Discussion:  This is a 57 year old woman with a history of migraines, adrenal mass, and hypothyroidism with recurrent episodes that were concerning for complex partial seizures. I have witnessed one episode and am very concerned that it is nonepileptic. At this point the patient is still refusing to come in for video EEG. The episodes have improved with the reduction in her thyroid  medication but to me again this is not necessarily proof that these are physiological in nature.  We will continue Vimpat  200mg  bid and Levetiracetam  1125 bid. If she has more episodes or has other problems, we will again consider a video EEG in the future. If she continues to do well, we will continue downward titration in future visits.   The patient knows to call me for any questions, problems or seizures, otherwise she will follow-up in this clinic in six months.     Impression:     1. 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   2. Episodes of teeth clenching and left arm sensations in the past, likely nonepileptic.   3. Migraines.   4. Hypothyroidism.   5. Concerned that symptoms may be related to too much thyroid  replacement.     Recommendations:     1. Continue  Vimpat  200 mg bid and Levetiracetam  1125 mg.   2. We will draw levels today   3. Call for any questions, problems or worsening of seizures.   4. Follow-up in this clinic in six months     I personally performed the service.     MELISSA T SCHRODER, PA      I was personally with the patient for 30 minutes. More than 50% of this time was spent doing counseling.

## 2013-02-20 ENCOUNTER — Other Ambulatory Visit: Payer: Self-pay | Admitting: Internal Medicine

## 2013-02-20 NOTE — Telephone Encounter (Signed)
 Okay to refill?

## 2013-02-20 NOTE — Telephone Encounter (Signed)
Okay to refill? 

## 2013-02-20 NOTE — Telephone Encounter (Signed)
Refilled clonazepam #40 with no refills.

## 2013-03-24 ENCOUNTER — Other Ambulatory Visit: Payer: Self-pay | Admitting: Internal Medicine

## 2013-04-14 ENCOUNTER — Other Ambulatory Visit: Payer: Self-pay | Admitting: Internal Medicine

## 2013-04-14 NOTE — Telephone Encounter (Signed)
Refilled clonazepam #40 with no refills.  rx signed and in your basket.

## 2013-04-15 NOTE — Telephone Encounter (Signed)
Late entry- Rx faxed on 11/18 to [facility]

## 2013-04-16 ENCOUNTER — Encounter: Payer: Self-pay | Admitting: *Deleted

## 2013-04-16 NOTE — Progress Notes (Signed)
 Chief Complaint: seizures.     History of Present Illness:    Please see Dr. Bettyann initial note dated March 31, 2008 and previous notes for full details. Briefly, the patient is a 57 year old, right-handed Caucasian female with a history of migraines who started having auras consisting of a rising feeling in her stomach going up to her mouth with her mouth feeling funny for 10 to 15 seconds. Afterwards, this would resolve. These often occurred in clusters but were fairly infrequent. In August 2009, she had a convulsive seizure preceded by such an episode, and then in February 2010 she had another episode.   Her first medication was Levetiracetam  but it made her moody so they tried several other medications and had side effects.She was switched back to Levetiracetam  but then had another episode, and therefore, she was put on Vimpat . She continued to have these auras along with some left arm heaviness.  She also had some episodes where she would have funny feelings followed by clamping down of her teeth, her neck arching back, and her screaming for help along with the left arm sensation. This would last for a few minutes but often up to 20 minutes. Her medications were increased but we had been asking repeatedly for her to come in for a video EEG.   In March 2012, she had an episode where she slumped to the left side of her chair, her eyes closed and she was able to speak in a slow voice. She had some difficulty getting words out but kept speaking and answered appropriately. Her vital signs were stable although her blood pressure was high. Because of the episode we decided again to try for video EEG, but again she was unable to come in. Since that time, she has continued on Vimpat  and Levetiracetam . She says that she eventually noticed that she always had problems when her Synthroid  has been increased. She of her own accord reduced her Synthroid  by half and since then she reports that she has felt much better.  Since that time she has not had any episodes.  We tried decreasing her Vimpat  down to 150 mg bid but she started getting her auras which are nervous feelings that come over her. She went back up to 200 mg bid. Her Levetiracetam  is 750 mg, 1.5 tabs twice daily. Otherwise she has been doing well. She feels like she tolerates her medication well. She does notice that she has some mild balance problems after she takes her medication.     Past Medical History:     Past Medical History   Diagnosis Date   . Seizures    . Hypertension    . Hypothyroid    . Sinus tachycardia    . Migraine headache    . Adrenal mass      Past Surgical History   Procedure Laterality Date   . Laminectomy lumbar spine       Medications:     Current Outpatient Prescriptions   Medication Sig Dispense Refill   . aspirin  81 mg tablet 1 tab by mouth daily       . clonazePAM  (KLONOPIN ) 0.5 MG tablet 1 tab by mouth as needed as needed       . CRESTOR  5 mg tablet Take 1 tablet by mouth Daily.       . lacosamide  (VIMPAT ) 200 mg tablet Take 1 tablet (200 mg total) by mouth 2 (two) times daily.  65 tablet  5   . levETIRAcetam  (KEPPRA )  750 MG tablet Take 2 tablets (1,500 mg total) by mouth 2 (two) times daily.  120 tablet  11   . levothyroxine  (SYNTHROID , LEVOTHROID) 100 MCG tablet 1 tab by mouth daily       . LOVAZA 1 gram capsule Take 2 tablets by mouth Twice daily.       . metoprolol  (TOPROL -XL) 50 MG XL tablet 1 tab by mouth daily       . triamterene -hydrochlorothiazide  (DYAZIDE ) 37.5-25 mg capsule 1 cap by mouth daily         No current facility-administered medications for this visit.     Allergies:  None.     Physical Examination:    Filed Vitals:    01/27/13 0928   BP: 118/70   Pulse: 64   Height: 161.3 cm (5' 3.5)   Weight: 100.699 kg (222 lb)     The patient was fully alert and oriented, her language was normal, eye movements were intact. Her face was symmetric. There was no dysmetria, there was no drift. Her gait was normal.     Laboratory Data:  pending    Discussion:  This is a 57 year old woman with a history of migraines, adrenal mass, and hypothyroidism with recurrent episodes that were concerning for complex partial seizures. Dr. Evelia witnessed one episode and was concerned that it is nonepileptic.   At this point the patient is doing okay in regards to seizures.  The episodes have improved with the reduction in her thyroid  medication but this is not necessarily proof that these are physiological in nature.  We will continue Vimpat  200mg  bid and Levetiracetam  1125 bid. If she has more episodes or has other problems, we will again consider a video EEG in the future.    Impression:     1. 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   2. Episodes of teeth clenching and left arm sensations in the past, likely nonepileptic.   3. Migraines.   4. Hypothyroidism.   5. Concerned that symptoms may be related to too much thyroid  replacement.     Recommendations:     1. Continue  Vimpat  200 mg bid and Levetiracetam  1125 mg.   2. We will draw levels today   3. Call for any questions, problems or worsening of seizures.   4. Follow-up in this clinic in six months     I personally performed the service.     MELISSA T SCHRODER, PA    I was personally with the patient for 30 minutes. More than 50% of this time was spent doing counseling.

## 2013-05-14 ENCOUNTER — Encounter: Payer: Self-pay | Admitting: Internal Medicine

## 2013-06-10 ENCOUNTER — Other Ambulatory Visit: Payer: Self-pay | Admitting: Internal Medicine

## 2013-06-17 ENCOUNTER — Other Ambulatory Visit: Payer: Self-pay | Admitting: Internal Medicine

## 2013-06-17 NOTE — Telephone Encounter (Signed)
Ok refill? 

## 2013-06-17 NOTE — Telephone Encounter (Signed)
Rx faxed to pharmacy  

## 2013-06-17 NOTE — Telephone Encounter (Signed)
Refilled #40 with no refills.

## 2013-07-02 ENCOUNTER — Telehealth: Payer: Self-pay | Admitting: *Deleted

## 2013-07-02 NOTE — Telephone Encounter (Signed)
Pt called to notify that she now has [employer] insurance & the cost for her Fish Oil & Crestor 5mg  is now $400/month. Pt's formulary suggests that she changes to Fenofibrate & Simvastatin. Please would like a 90 day supply sent to [facility]. And pt aware to check pharmacy by lunch time tomorrow

## 2013-07-03 NOTE — Telephone Encounter (Signed)
Please notify pt that I would like for her to try otc fish oil.  Can take 1000 bid to begin.  I would prefer a different generic (other than simvastatin), but if simvastatin is what she prefers then start simvastatin 10mg  q day.  We will need to follow her cholesterol panel and make adjustments as needed (pending response to the medication).  She has a f/u appt with me 07/23/13.  Can schedule f/u labs then.

## 2013-07-03 NOTE — Telephone Encounter (Signed)
LMTCB

## 2013-07-06 ENCOUNTER — Other Ambulatory Visit: Payer: Self-pay | Admitting: *Deleted

## 2013-07-06 MED ORDER — ATORVASTATIN CALCIUM 10 MG PO TABS: 10.0000 mg | ORAL_TABLET | Freq: Every day | ORAL | Status: DC

## 2013-07-06 NOTE — Telephone Encounter (Signed)
Pt notified & okay to try generic Lipitor. Will check labs at next visit

## 2013-07-20 ENCOUNTER — Ambulatory Visit (INDEPENDENT_AMBULATORY_CARE_PROVIDER_SITE_OTHER): Payer: Managed Care, Other (non HMO) | Admitting: Family Medicine

## 2013-07-20 ENCOUNTER — Encounter: Payer: Self-pay | Admitting: Family Medicine

## 2013-07-20 ENCOUNTER — Telehealth: Payer: Self-pay | Admitting: Internal Medicine

## 2013-07-20 VITALS — BP 130/86 | HR 61 | Temp 97.6°F | Ht 63.25 in | Wt 239.5 lb

## 2013-07-20 DIAGNOSIS — J069 Acute upper respiratory infection, unspecified: Secondary | ICD-10-CM

## 2013-07-20 DIAGNOSIS — R001 Bradycardia, unspecified: Secondary | ICD-10-CM | POA: Insufficient documentation

## 2013-07-20 NOTE — Progress Notes (Signed)
Subjective:    Patient ID: Jennifer CoolerPatricia A [name], female    DOB: 07/22/1955, 58 y.o.   MRN: [id number]  HPI Here for tender jaw and body aches and uti symptoms   Last night she washed her face and neck- and noted that R side of neck was tender  Went to bed and slept well  Got up this am and noted that the area of her neck is more tender  As the day progresses - achey all over / and throat feels scratchy   Sinus problems with post nasal drip for 3 d/ a little congestion/ clear mucous a bit of a cough   No fever or chills  Is exhausted and lousy feeling   Patient Active Problem List   Diagnosis Date Noted  . Diverticulosis 01/26/2013  . Migraine 01/26/2013  . Hypothyroidism 01/21/2013  . Hypercholesterolemia 01/21/2013  . Seizure disorder 01/21/2013  . Degenerative arthritis of hip 09/19/2012   Past Medical History  Diagnosis Date  . Complication of anesthesia     severe tremors "shakes"  . Hyperlipidemia   . Epilepsy     last seizure 4 yrs ago  . Arthritis   . Diverticulitis   . History of transfusion   . Hypothyroidism   . Hx of migraines     stopped with menopause   Past Surgical History  Procedure Laterality Date  . Colon surgery    . Lumbar laminectomy      age 58  . Tonsillectomy    . Joint replacement      left total hip  . Bunionectomy  2013  . 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 Colon resection  04/2009    post perf colonoscopy   History  Substance Use Topics  . Smoking status: Never Smoker   . Smokeless tobacco: Never Used  . Alcohol Use: No   Family History  Problem Relation Age of Onset  . Lung cancer Mother   . Hyperlipidemia Mother   . Heart disease Father    No Known Allergies Current Outpatient Prescriptions on File Prior to Visit  Medication Sig Dispense Refill  .  aspirin 325 MG tablet Take 1 tablet (325 mg total) by mouth 2 (two) times daily after a meal.  60 tablet  0  . atorvastatin (LIPITOR) 10 MG tablet Take 1 tablet (10 mg total) by mouth daily.  30 tablet  1  . clonazePAM (KLONOPIN) 0.5 MG tablet TAKE 1 TABLET BY MOUTH TWICE DAILY AS NEEDED  40 tablet  0  . lacosamide (VIMPAT) 200 MG TABS Take 200 mg by mouth 2 (two) times daily.      Marland Kitchen. levETIRAcetam (KEPPRA) 750 MG tablet Take 1,125-1,500 mg by mouth every 12 (twelve) hours. Takes 1 and 1/2 tablet in the morning and 2 tablets at night      . levothyroxine (SYNTHROID, LEVOTHROID) 137 MCG tablet TAKE 1 TABLET BY MOUTH EVERY DAY  90 tablet  1  . metoprolol succinate (TOPROL-XL) 50 MG 24 hr tablet Take 50 mg by mouth every evening. Take with or immediately following a meal.      . Multiple Vitamin (MULTIVITAMIN WITH MINERALS) TABS Take 1 tablet by mouth daily.      . polyethylene glycol (MIRALAX / GLYCOLAX) packet Take 17 g by mouth daily.      Marland Kitchen. triamterene-hydrochlorothiazide (  DYAZIDE) 37.5-25 MG per capsule Take 1 capsule by mouth daily before breakfast.       No current facility-administered medications on file prior to visit.     Review of Systems Review of Systems  Constitutional: Negative for fever, appetite change,  and unexpected weight change.  ENT pos for congestion/ drip/ mild st / neg for facial pain Eyes: Negative for pain and visual disturbance.  Respiratory: Negative for wheeze  and shortness of breath.   Cardiovascular: Negative for cp or palpitations    Gastrointestinal: Negative for nausea, diarrhea and constipation.  Genitourinary: Negative for urgency and frequency.  Skin: Negative for pallor or rash   Neurological: Negative for weakness, light-headedness, numbness and headaches.  Hematological: Negative for adenopathy. Does not bruise/bleed easily.  Psychiatric/Behavioral: Negative for dysphoric mood. The patient is not nervous/anxious.         Objective:   Physical  Exam  Constitutional: She appears well-developed and well-nourished. No distress.  obese and well appearing   HENT:  Head: Normocephalic and atraumatic.  Right Ear: External ear normal.  Left Ear: External ear normal.  Mouth/Throat: No oropharyngeal exudate.  Nares are injected and congested  Clear rhinorrhea Clear throat    Eyes: Conjunctivae and EOM are normal. Pupils are equal, round, and reactive to light. Right eye exhibits no discharge. Left eye exhibits no discharge. No scleral icterus.  Neck: Normal range of motion. Neck supple.  R cervical adenopathy-anterior/ small 1 cm tender mobile ln  Cardiovascular: Normal rate and regular rhythm.   Pulmonary/Chest: Effort normal and breath sounds normal. No respiratory distress. She has no wheezes. She has no rales. She exhibits no tenderness.  Lymphadenopathy:    She has cervical adenopathy.  Neurological: She is alert. No cranial nerve deficit.  Skin: Skin is warm and dry. No rash noted.  Psychiatric: She has a normal mood and affect.          Assessment & Plan:

## 2013-07-20 NOTE — Telephone Encounter (Signed)
FYI-seeing Dr. Milinda Antisower today

## 2013-07-20 NOTE — Patient Instructions (Signed)
I think you have a viral upper respiratory infection  Rest and drink lots of fluids  An antihistamine will help drip/ congestion  Ibuprofen (with food) will help pain/ fever /aches  Warm compress on neck If more neck swelling or high fever or severe sore throat- call Update if not starting to improve in a week or if worsening

## 2013-07-20 NOTE — Telephone Encounter (Signed)
Noted  

## 2013-07-20 NOTE — Progress Notes (Signed)
Subjective:    Patient ID: Darlene CoolerPatricia A Ford, female    DOB: 07/22/1955, 58 y.o.   MRN: [id number]  HPI Here for tender jaw and body aches and uti symptoms   Last night she washed her face and neck- and noted that R side of neck was tender  Went to bed and slept well  Got up this am and noted that the area of her neck is more tender  As the day progresses - achey all over / and throat feels scratchy   Sinus problems with post nasal drip for 3 d/ a little congestion/ clear mucous a bit of a cough   No fever or chills  Is exhausted and lousy feeling   Patient Active Problem List   Diagnosis Date Noted  . Diverticulosis 01/26/2013  . Migraine 01/26/2013  . Hypothyroidism 01/21/2013  . Hypercholesterolemia 01/21/2013  . Seizure disorder 01/21/2013  . Degenerative arthritis of hip 09/19/2012   Past Medical History  Diagnosis Date  . Complication of anesthesia     severe tremors "shakes"  . Hyperlipidemia   . Epilepsy     last seizure 4 yrs ago  . Arthritis   . Diverticulitis   . History of transfusion   . Hypothyroidism   . Hx of migraines     stopped with menopause   Past Surgical History  Procedure Laterality Date  . Colon surgery    . Lumbar laminectomy      age 58  . Tonsillectomy    . Joint replacement      left total hip  . Bunionectomy  2013  . 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 Colon resection  04/2009    post perf colonoscopy   History  Substance Use Topics  . Smoking status: Never Smoker   . Smokeless tobacco: Never Used  . Alcohol Use: No   Family History  Problem Relation Age of Onset  . Lung cancer Mother   . Hyperlipidemia Mother   . Heart disease Father    No Known Allergies Current Outpatient Prescriptions on File Prior to Visit  Medication Sig Dispense Refill  .  aspirin 325 MG tablet Take 1 tablet (325 mg total) by mouth 2 (two) times daily after a meal.  60 tablet  0  . atorvastatin (LIPITOR) 10 MG tablet Take 1 tablet (10 mg total) by mouth daily.  30 tablet  1  . clonazePAM (KLONOPIN) 0.5 MG tablet TAKE 1 TABLET BY MOUTH TWICE DAILY AS NEEDED  40 tablet  0  . lacosamide (VIMPAT) 200 MG TABS Take 200 mg by mouth 2 (two) times daily.      Marland Kitchen. levETIRAcetam (KEPPRA) 750 MG tablet Take 1,125-1,500 mg by mouth every 12 (twelve) hours. Takes 1 and 1/2 tablet in the morning and 2 tablets at night      . levothyroxine (SYNTHROID, LEVOTHROID) 137 MCG tablet TAKE 1 TABLET BY MOUTH EVERY DAY  90 tablet  1  . metoprolol succinate (TOPROL-XL) 50 MG 24 hr tablet Take 50 mg by mouth every evening. Take with or immediately following a meal.      . Multiple Vitamin (MULTIVITAMIN WITH MINERALS) TABS Take 1 tablet by mouth daily.      . polyethylene glycol (MIRALAX / GLYCOLAX) packet Take 17 g by mouth daily.      Marland Kitchen. triamterene-hydrochlorothiazide (  DYAZIDE) 37.5-25 MG per capsule Take 1 capsule by mouth daily before breakfast.       No current facility-administered medications on file prior to visit.     Review of Systems Review of Systems  Constitutional: Negative for fever, appetite change,  and unexpected weight change.  ENT pos for congestion/ drip/ mild st / neg for facial pain Eyes: Negative for pain and visual disturbance.  Respiratory: Negative for wheeze  and shortness of breath.   Cardiovascular: Negative for cp or palpitations    Gastrointestinal: Negative for nausea, diarrhea and constipation.  Genitourinary: Negative for urgency and frequency.  Skin: Negative for pallor or rash   Neurological: Negative for weakness, light-headedness, numbness and headaches.  Hematological: Negative for adenopathy. Does not bruise/bleed easily.  Psychiatric/Behavioral: Negative for dysphoric mood. The patient is not nervous/anxious.         Objective:   Physical  Exam  Constitutional: She appears well-developed and well-nourished. No distress.  obese and well appearing   HENT:  Head: Normocephalic and atraumatic.  Right Ear: External ear normal.  Left Ear: External ear normal.  Mouth/Throat: No oropharyngeal exudate.  Nares are injected and congested  Clear rhinorrhea Clear throat    Eyes: Conjunctivae and EOM are normal. Pupils are equal, round, and reactive to light. Right eye exhibits no discharge. Left eye exhibits no discharge. No scleral icterus.  Neck: Normal range of motion. Neck supple.  R cervical adenopathy-anterior/ small 1 cm tender mobile ln  Cardiovascular: Normal rate and regular rhythm.   Pulmonary/Chest: Effort normal and breath sounds normal. No respiratory distress. She has no wheezes. She has no rales. She exhibits no tenderness.  Lymphadenopathy:    She has cervical adenopathy.  Neurological: She is alert. No cranial nerve deficit.  Skin: Skin is warm and dry. No rash noted.  Psychiatric: She has a normal mood and affect.          Assessment & Plan:

## 2013-07-20 NOTE — Telephone Encounter (Signed)
Patient Information:  Caller Name: [name]  Phone: [phone]  Patient: Darlene Ford, Darlene Ford  Gender: Female  DOB: 08/06/1955  Age: 58 Years  PCP: Dale DurhamScott, Charlene  Office Follow Up:  Does the office need to follow up with this patient?: No  Instructions For The Office: N/A  RN Note:  Pt agrees to OV today for eval call back infromation  Symptoms  Reason For Call & Symptoms: Pt states that she has a swollen area around the right side of the Jaw.  Painful to touch.  No swollen.  No redness.  Pt also states that she feels generally ill and flushed.  She has body aches  Reviewed Health History In EMR: Yes  Reviewed Medications In EMR: Yes  Reviewed Allergies In EMR: Yes  Reviewed Surgeries / Procedures: Yes  Date of Onset of Symptoms: 07/19/2013  Guideline(s) Used:  Lymph Nodes - Swollen  Disposition Per Guideline:   See Today in Office  Reason For Disposition Reached:   Tender node in the neck and also has a sore throat with minimal/no runny nose or cough  Advice Given:  Call Back If:  Overlying skin becomes red  Fever more than 103 F (39.4 C) occurs  You become worse or are worried about a lymph node.  Patient Will Follow Care Advice:  YES  Appointment Scheduled:  07/20/2013 11:30:00 Appointment Scheduled Provider:  Other

## 2013-07-20 NOTE — Progress Notes (Signed)
Pre visit review using our clinic review tool, if applicable. No additional management support is needed unless otherwise documented below in the visit note. 

## 2013-07-20 NOTE — Assessment & Plan Note (Signed)
Disc symptomatic care - see instructions on AVS Disc use of analgesic/nsaid for pain/ fever  Warm compress on neck -will watch LN size Update if not starting to improve in a week or if worsening

## 2013-07-23 ENCOUNTER — Ambulatory Visit: Payer: 59 | Admitting: Internal Medicine

## 2013-08-18 ENCOUNTER — Ambulatory Visit: Payer: Managed Care, Other (non HMO) | Admitting: Internal Medicine

## 2013-08-26 ENCOUNTER — Other Ambulatory Visit: Payer: Self-pay | Admitting: *Deleted

## 2013-08-26 MED ORDER — ATORVASTATIN CALCIUM 10 MG PO TABS: 10.0000 mg | ORAL_TABLET | Freq: Every day | ORAL | Status: DC

## 2013-09-21 ENCOUNTER — Encounter: Payer: Self-pay | Admitting: Internal Medicine

## 2013-09-21 ENCOUNTER — Ambulatory Visit (INDEPENDENT_AMBULATORY_CARE_PROVIDER_SITE_OTHER): Payer: Managed Care, Other (non HMO) | Admitting: Internal Medicine

## 2013-09-21 VITALS — BP 112/68 | HR 54 | Temp 98.0°F | Resp 16 | Ht 63.25 in | Wt 236.8 lb

## 2013-09-21 DIAGNOSIS — I498 Other specified cardiac arrhythmias: Secondary | ICD-10-CM

## 2013-09-21 DIAGNOSIS — E039 Hypothyroidism, unspecified: Secondary | ICD-10-CM

## 2013-09-21 DIAGNOSIS — K5730 Diverticulosis of large intestine without perforation or abscess without bleeding: Secondary | ICD-10-CM

## 2013-09-21 DIAGNOSIS — M169 Osteoarthritis of hip, unspecified: Secondary | ICD-10-CM

## 2013-09-21 DIAGNOSIS — G40909 Epilepsy, unspecified, not intractable, without status epilepticus: Secondary | ICD-10-CM

## 2013-09-21 DIAGNOSIS — R739 Hyperglycemia, unspecified: Secondary | ICD-10-CM

## 2013-09-21 DIAGNOSIS — K5790 Diverticulosis of intestine, part unspecified, without perforation or abscess without bleeding: Secondary | ICD-10-CM

## 2013-09-21 DIAGNOSIS — E7800 Pure hypercholesterolemia, unspecified: Secondary | ICD-10-CM

## 2013-09-21 DIAGNOSIS — M1610 Unilateral primary osteoarthritis, unspecified hip: Secondary | ICD-10-CM

## 2013-09-21 DIAGNOSIS — R7309 Other abnormal glucose: Secondary | ICD-10-CM

## 2013-09-21 DIAGNOSIS — R001 Bradycardia, unspecified: Secondary | ICD-10-CM

## 2013-09-21 LAB — LIPID PANEL
CHOLESTEROL: 215 mg/dL — AB (ref 0–200)
HDL: 42.4 mg/dL (ref >39.00)
LDL CALC: 133 mg/dL — AB (ref 0–99)
Total CHOL/HDL Ratio: 5
Triglycerides: 199 mg/dL — ABNORMAL HIGH (ref 0.0–149.0)
VLDL: 39.8 mg/dL (ref 0.0–40.0)

## 2013-09-21 LAB — CBC WITH DIFFERENTIAL/PLATELET
BASOS ABS: 0 10*3/uL (ref 0.0–0.1)
BASOS PCT: 0.6 % (ref 0.0–3.0)
EOS PCT: 3.5 % (ref 0.0–5.0)
Eosinophils Absolute: 0.2 10*3/uL (ref 0.0–0.7)
HCT: 39.3 % (ref 36.0–46.0)
HEMOGLOBIN: 13.2 g/dL (ref 12.0–15.0)
LYMPHS PCT: 31.9 % (ref 12.0–46.0)
Lymphs Abs: 2 10*3/uL (ref 0.7–4.0)
MCHC: 33.5 g/dL (ref 30.0–36.0)
MCV: 91.5 fl (ref 78.0–100.0)
MONOS PCT: 6.9 % (ref 3.0–12.0)
Monocytes Absolute: 0.4 10*3/uL (ref 0.1–1.0)
NEUTROS ABS: 3.6 10*3/uL (ref 1.4–7.7)
Neutrophils Relative %: 57.1 % (ref 43.0–77.0)
Platelets: 299 10*3/uL (ref 150.0–400.0)
RBC: 4.3 Mil/uL (ref 3.87–5.11)
RDW: 13.3 % (ref 11.5–14.6)
WBC: 6.3 10*3/uL (ref 4.5–10.5)

## 2013-09-21 LAB — BASIC METABOLIC PANEL
BUN: 17 mg/dL (ref 6–23)
CHLORIDE: 101 meq/L (ref 96–112)
CO2: 32 mEq/L (ref 19–32)
Calcium: 9.4 mg/dL (ref 8.4–10.5)
Creatinine, Ser: 0.7 mg/dL (ref 0.4–1.2)
GFR: 94.13 mL/min (ref >60.00)
Glucose, Bld: 83 mg/dL (ref 70–99)
POTASSIUM: 3.2 meq/L — AB (ref 3.5–5.1)
SODIUM: 142 meq/L (ref 135–145)

## 2013-09-21 LAB — HEPATIC FUNCTION PANEL
ALT: 14 U/L (ref 0–35)
AST: 14 U/L (ref 0–37)
Albumin: 4 g/dL (ref 3.5–5.2)
Alkaline Phosphatase: 42 U/L (ref 39–117)
Bilirubin, Direct: 0 mg/dL (ref 0.0–0.3)
TOTAL PROTEIN: 7.1 g/dL (ref 6.0–8.3)
Total Bilirubin: 0.5 mg/dL (ref 0.3–1.2)

## 2013-09-21 LAB — HEMOGLOBIN A1C: HEMOGLOBIN A1C: 5.6 % (ref 4.6–6.5)

## 2013-09-21 LAB — TSH: TSH: 3.67 u[IU]/mL (ref 0.35–5.50)

## 2013-09-21 MED ORDER — METOPROLOL SUCCINATE ER 50 MG PO TB24: 50.0000 mg | ORAL_TABLET | Freq: Every day | ORAL | Status: DC

## 2013-09-21 NOTE — Progress Notes (Signed)
Subjective:    Patient ID: Jennifer [name], female    DOB: 06/24/1955, 58 y.o.   MRN: [id number]  HPI 58 year old female with past history of arthritis, diverticulosis and diverticulitis, hypercholesterolemia, hypothyroidism and seizure disorder.  She comes in today for a scheduled follow up.   Previously seeing Dr Kenna GilbertArgarwal [facility]  Also is followed by Dr Sherlean FootSinha [facility].  Moved here in 2005.  Previously worked as a Retail bankerdialysis nurse.  She has known diverticulosis and suffered a perforation after a colonoscopy in 2010.  Had a partial colon resection with 8-10 inches of her colon removed.  Occasionally will have diverticular flares.  Previously had C. Diff infection.  Bowels stable now.  Eating and drinking well.  No nausea or vomiting.  Had right hip surgery in 4/14.  She is back to golfing and walking.  She is followed by neurology for her seizures.  Stable.  Sees them 2x/year.  She previously had an extensive cardiac w/up.  Had heart cath approximately five years ago that was normal.  Previously had a history of migraine headaches, but these stopped with menopause.  Of note, she tried atorvastatin.  Caused fluid retention.  Is off now.  She is concerned about her weight.  Has tried weight watchers, medifast and weight limited.  She is interested in having Lap Band.  She is going to contact [facility].  Also, on metoprolol.  Pulse low.  Was questioning need to decrease the dose.     Past Medical History  Diagnosis Date  . Complication of anesthesia     severe tremors "shakes"  . Hyperlipidemia   . Epilepsy     last seizure 4 yrs ago  . Arthritis   . Diverticulitis   . History of transfusion   . Hypothyroidism   . Hx of migraines     stopped with menopause    Outpatient Encounter Prescriptions as of 09/21/2013  Medication Sig  . aspirin 325 MG tablet Take 1 tablet (325 mg total) by mouth 2 (two) times daily after a meal.  . clonazePAM (KLONOPIN) 0.5 MG tablet TAKE 1 TABLET BY  MOUTH TWICE DAILY AS NEEDED  . lacosamide (VIMPAT) 200 MG TABS Take 200 mg by mouth 2 (two) times daily.  Marland Kitchen. levETIRAcetam (KEPPRA) 1000 MG tablet 1,000 mg. Takes 1 and 1/2 tablet in the morning and 2 tablets at night  . levothyroxine (SYNTHROID, LEVOTHROID) 137 MCG tablet TAKE 1 TABLET BY MOUTH EVERY DAY  . metoprolol succinate (TOPROL-XL) 50 MG 24 hr tablet Take 50 mg by mouth every evening. Take with or immediately following a meal.  . Multiple Vitamin (MULTIVITAMIN WITH MINERALS) TABS Take 1 tablet by mouth daily.  . polyethylene glycol (MIRALAX / GLYCOLAX) packet Take 17 g by mouth daily.  Marland Kitchen. triamterene-hydrochlorothiazide (DYAZIDE) 37.5-25 MG per capsule Take 1 capsule by mouth daily before breakfast.  . [DISCONTINUED] levETIRAcetam (KEPPRA) 750 MG tablet Take 1,125-1,500 mg by mouth every 12 (twelve) hours. Takes 1 and 1/2 tablet in the morning and 2 tablets at night  . atorvastatin (LIPITOR) 10 MG tablet Take 1 tablet (10 mg total) by mouth daily.  . Omega-3 Fatty Acids (FISH OIL PO) Take 2 tablets by mouth 2 (two) times daily.    Review of Systems Patient denies any headache, lightheadedness or dizziness.  No sinus or allergy symptoms.   No chest pain, tightness or palpitations.  No increased shortness of breath, cough or congestion.  No nausea or vomiting.  No acid reflux.  No abdominal pain or cramping.  No bowel change, such as diarrhea, constipation, BRBPR or melana.  No urine change.  Overall feels things are stable.  No seizures.  Had some auras previously with increased stress.  These have resolved.  She has f/u with her neurologist in 7/15.  Desire for weight loss.       Objective:   Physical Exam  Filed Vitals:   09/21/13 1126  BP: 112/68  Pulse: 54  Temp: 98 F (36.7 C)  Resp: 16   Blood pressure recheck:  124/82, pulse 4356-7160  58 year old female in no acute distress.   HEENT:  Nares- clear.  Oropharynx - without lesions. NECK:  Supple.  Nontender.  No audible bruit.   HEART:  Appears to be regular. LUNGS:  No crackles or wheezing audible.  Respirations even and unlabored.  RADIAL PULSE:  Equal bilaterally.  ABDOMEN:  Soft, nontender.  Bowel sounds present and normal.  No audible abdominal bruit.   EXTREMITIES:  No increased edema present.  DP pulses palpable and equal bilaterally.          Assessment & Plan:  DESIRE FOR WEIGHT LOSS.  Has tried weight watchers, medifast and weight limited.  Desires referral for lap band.  She plans to contact [facility].    HEALTH MAINTENANCE. Plans to get her breast and pelvic exams through gyn.  Keep up to date with mammograms.  Last colonoscopy 2010.  Obtain records.    I spent 25 minutes with the patient and more than 50% of the time was spent in consultation regarding the above.

## 2013-09-21 NOTE — Progress Notes (Signed)
Pre visit review using our clinic review tool, if applicable. No additional management support is needed unless otherwise documented below in the visit note. 

## 2013-09-21 NOTE — Assessment & Plan Note (Addendum)
Low cholesterol diet and exercise.  Off crestor.  Tried atorvastatin.  Caused fluid retention.  Wants to restart crestor.  Has met her deductible so "can afford".  Follow lipid panel and liver function.

## 2013-09-21 NOTE — Assessment & Plan Note (Signed)
On levothyroxine.  Follow tsh.  

## 2013-09-21 NOTE — Progress Notes (Signed)
Subjective:    Patient ID: Darlene CoolerPatricia A Ford, female    DOB: 06/24/1955, 58 y.o.   MRN: [id number]  HPI 58 year old female with past history of arthritis, diverticulosis and diverticulitis, hypercholesterolemia, hypothyroidism and seizure disorder.  She comes in today for a scheduled follow up.   Previously seeing Dr Kenna GilbertArgarwal [facility]  Also is followed by Dr Sherlean FootSinha [facility].  Moved here in 2005.  Previously worked as a Retail bankerdialysis nurse.  She has known diverticulosis and suffered a perforation after a colonoscopy in 2010.  Had a partial colon resection with 8-10 inches of her colon removed.  Occasionally will have diverticular flares.  Previously had C. Diff infection.  Bowels stable now.  Eating and drinking well.  No nausea or vomiting.  Had right hip surgery in 4/14.  She is back to golfing and walking.  She is followed by neurology for her seizures.  Stable.  Sees them 2x/year.  She previously had an extensive cardiac w/up.  Had heart cath approximately five years ago that was normal.  Previously had a history of migraine headaches, but these stopped with menopause.  Of note, she tried atorvastatin.  Caused fluid retention.  Is off now.  She is concerned about her weight.  Has tried weight watchers, medifast and weight limited.  She is interested in having Lap Band.  She is going to contact [facility].  Also, on metoprolol.  Pulse low.  Was questioning need to decrease the dose.     Past Medical History  Diagnosis Date  . Complication of anesthesia     severe tremors "shakes"  . Hyperlipidemia   . Epilepsy     last seizure 4 yrs ago  . Arthritis   . Diverticulitis   . History of transfusion   . Hypothyroidism   . Hx of migraines     stopped with menopause    Outpatient Encounter Prescriptions as of 09/21/2013  Medication Sig  . aspirin 325 MG tablet Take 1 tablet (325 mg total) by mouth 2 (two) times daily after a meal.  . clonazePAM (KLONOPIN) 0.5 MG tablet TAKE 1 TABLET BY  MOUTH TWICE DAILY AS NEEDED  . lacosamide (VIMPAT) 200 MG TABS Take 200 mg by mouth 2 (two) times daily.  Marland Kitchen. levETIRAcetam (KEPPRA) 1000 MG tablet 1,000 mg. Takes 1 and 1/2 tablet in the morning and 2 tablets at night  . levothyroxine (SYNTHROID, LEVOTHROID) 137 MCG tablet TAKE 1 TABLET BY MOUTH EVERY DAY  . metoprolol succinate (TOPROL-XL) 50 MG 24 hr tablet Take 50 mg by mouth every evening. Take with or immediately following a meal.  . Multiple Vitamin (MULTIVITAMIN WITH MINERALS) TABS Take 1 tablet by mouth daily.  . polyethylene glycol (MIRALAX / GLYCOLAX) packet Take 17 g by mouth daily.  Marland Kitchen. triamterene-hydrochlorothiazide (DYAZIDE) 37.5-25 MG per capsule Take 1 capsule by mouth daily before breakfast.  . [DISCONTINUED] levETIRAcetam (KEPPRA) 750 MG tablet Take 1,125-1,500 mg by mouth every 12 (twelve) hours. Takes 1 and 1/2 tablet in the morning and 2 tablets at night  . atorvastatin (LIPITOR) 10 MG tablet Take 1 tablet (10 mg total) by mouth daily.  . Omega-3 Fatty Acids (FISH OIL PO) Take 2 tablets by mouth 2 (two) times daily.    Review of Systems Patient denies any headache, lightheadedness or dizziness.  No sinus or allergy symptoms.   No chest pain, tightness or palpitations.  No increased shortness of breath, cough or congestion.  No nausea or vomiting.  No acid reflux.  No abdominal pain or cramping.  No bowel change, such as diarrhea, constipation, BRBPR or melana.  No urine change.  Overall feels things are stable.  No seizures.  Had some auras previously with increased stress.  These have resolved.  She has f/u with her neurologist in 7/15.  Desire for weight loss.       Objective:   Physical Exam  Filed Vitals:   09/21/13 1126  BP: 112/68  Pulse: 54  Temp: 98 F (36.7 C)  Resp: 16   Blood pressure recheck:  124/82, pulse [id number]  58 year old female in no acute distress.   HEENT:  Nares- clear.  Oropharynx - without lesions. NECK:  Supple.  Nontender.  No audible bruit.   HEART:  Appears to be regular. LUNGS:  No crackles or wheezing audible.  Respirations even and unlabored.  RADIAL PULSE:  Equal bilaterally.  ABDOMEN:  Soft, nontender.  Bowel sounds present and normal.  No audible abdominal bruit.   EXTREMITIES:  No increased edema present.  DP pulses palpable and equal bilaterally.          Assessment & Plan:  DESIRE FOR WEIGHT LOSS.  Has tried weight watchers, medifast and weight limited.  Desires referral for lap band.  She plans to contact [facility].    HEALTH MAINTENANCE. Plans to get her breast and pelvic exams through gyn.  Keep up to date with mammograms.  Last colonoscopy 2010.  Obtain records.    I spent 25 minutes with the patient and more than 50% of the time was spent in consultation regarding the above.

## 2013-09-21 NOTE — Assessment & Plan Note (Signed)
S/p right hip surgery.  Doing well.  Follow.

## 2013-09-24 ENCOUNTER — Other Ambulatory Visit: Payer: Self-pay | Admitting: *Deleted

## 2013-09-24 MED ORDER — ROSUVASTATIN CALCIUM 5 MG PO TABS: 5.0000 mg | ORAL_TABLET | Freq: Every day | ORAL | Status: DC

## 2013-09-24 MED ORDER — TRIAMTERENE-HCTZ 37.5-25 MG PO CAPS: 1.0000 | ORAL_CAPSULE | Freq: Every day | ORAL | Status: DC

## 2013-09-24 MED ORDER — LEVOTHYROXINE SODIUM 137 MCG PO TABS
ORAL_TABLET | ORAL | Status: DC
Start: 2013-09-24 — End: 2014-02-22

## 2013-09-26 ENCOUNTER — Encounter: Payer: Self-pay | Admitting: Internal Medicine

## 2013-09-26 NOTE — Assessment & Plan Note (Signed)
Will decrease metoprolol to 1/2 tablet q day.  Follow pressure and pulse rate.

## 2013-09-26 NOTE — Assessment & Plan Note (Signed)
Intermittent diverticular flares.  Previous colon perforation during last colonoscopy.  S/p partial colon resection.  Bowels stable.  Doing well now.

## 2013-09-26 NOTE — Assessment & Plan Note (Signed)
Followed by neurology.  Maintained on Keppra.  Follow.  No recent seizures.   

## 2013-10-14 ENCOUNTER — Telehealth: Payer: Self-pay | Admitting: Internal Medicine

## 2013-10-14 DIAGNOSIS — E876 Hypokalemia: Secondary | ICD-10-CM

## 2013-10-14 NOTE — Telephone Encounter (Signed)
States she will be coming to the office 5/21 with her mother.  Pt would like to have her potassium rechecked at that time.

## 2013-10-14 NOTE — Telephone Encounter (Signed)
Order placed. Please put her on lab schedule

## 2013-10-15 ENCOUNTER — Other Ambulatory Visit (INDEPENDENT_AMBULATORY_CARE_PROVIDER_SITE_OTHER): Payer: Managed Care, Other (non HMO)

## 2013-10-15 DIAGNOSIS — E876 Hypokalemia: Secondary | ICD-10-CM

## 2013-10-15 LAB — POTASSIUM: Potassium: 4.2 mEq/L (ref 3.5–5.1)

## 2013-10-16 ENCOUNTER — Encounter: Payer: Self-pay | Admitting: *Deleted

## 2013-11-05 ENCOUNTER — Ambulatory Visit: Payer: Self-pay | Admitting: Internal Medicine

## 2013-11-05 LAB — HM MAMMOGRAPHY: HM MAMMO: NEGATIVE

## 2013-11-09 ENCOUNTER — Encounter: Payer: Self-pay | Admitting: Internal Medicine

## 2014-01-25 ENCOUNTER — Ambulatory Visit: Payer: Managed Care, Other (non HMO) | Admitting: Internal Medicine

## 2014-02-07 NOTE — Progress Notes (Signed)
 Chief Complaint: seizures.     History of Present Illness:    Please see Dr. Bettyann initial note dated March 31, 2008 and previous notes for full details. Briefly, patient is a 58 year old, Caucasian female with history of migraines who started having auras consisting of a rising feeling in her stomach going up to her mouth with her mouth feeling funny for 10 to 15 seconds. Afterwards, this would resolve. These often occurred in clusters but were fairly infrequent. In August 2009, she had a convulsive seizure preceded by such an episode, and then in February 2010 she had another episode.   Her first medication was Levetiracetam  but it made her moody so they tried several other medications and had side effects.She was switched back to Levetiracetam  but then had another episode, and therefore, she was put on Vimpat . She continued to have these auras along with some left arm heaviness.  She also had some episodes where she would have funny feelings followed by clamping down of her teeth, her neck arching back, and her screaming for help along with the left arm sensation. This would last for a few minutes but often up to 20 minutes. Her medications were increased but we had been asking repeatedly for her to come in for video EEG monitoring.   In March 2012, she had an episode where she slumped to the left side of her chair, her eyes closed and she was able to speak in a slow voice. She had some difficulty getting words out but kept speaking and answered appropriately. Her vital signs were stable although her blood pressure was high. Because of the episode we decided again to try for video EEG, but again she was unable to come in. Since that time, she has continued on Vimpat  and Levetiracetam . She says that she eventually noticed that she always had problems when her Synthroid  has been increased. She self reduced her Synthroid  by half and since then she reports that she has felt much better. Since that time she has  not had any episodes. She continues on Vimpat  200 mg bid and Levetiracetam  is 1500/2000 mg. She has been doing well. She feels like she tolerates her medication well. She does notice that she has some mild balance problems after she takes her medication but it is not enough to want to change her dosing.      Past Medical History:     Past Medical History   Diagnosis Date   . Seizures    . Hypertension    . Hypothyroid    . Sinus tachycardia    . Migraine headache    . Adrenal mass      Past Surgical History   Procedure Laterality Date   . Laminectomy lumbar spine       Medications:     Current Outpatient Prescriptions   Medication Sig Dispense Refill   . aspirin  81 mg tablet 1 tab by mouth daily     . clonazePAM  (KLONOPIN ) 0.5 MG tablet Take 1 tablet (0.5 mg total) by mouth 2 (two) times daily as needed. 90 tablet 1   . CRESTOR  5 mg tablet Take 1 tablet by mouth once daily.  1   . lacosamide  (VIMPAT ) 200 mg tablet Take 1 tablet (200 mg total) by mouth 2 (two) times daily. 270 tablet 1   . levETIRAcetam  (KEPPRA ) 1000 MG tablet Take 1.5 mg tab by mouth in a.m. And 2 tabs at bedtime 360 tablet 3   . levothyroxine  (SYNTHROID , LEVOTHROID)  137 MCG tablet Take by mouth once daily.   0   . metoprolol  (TOPROL -XL) 50 MG XL tablet 1 tab by mouth daily (Patient taking differently: Take 25 mg by mouth once daily. )     . triamterene -hydrochlorothiazide  (DYAZIDE ) 37.5-25 mg capsule 1 cap by mouth daily       No current facility-administered medications for this visit.     Allergies:  None.     Physical Examination:    Filed Vitals:    02/02/14 0912   BP: 124/81   Pulse: 54   Temp: 36.7 C (98 F)   Height: 161.3 cm (5' 3.5)   Weight: 108.863 kg (240 lb)     The patient was fully alert and oriented, her language was normal, eye movements were intact. Her face was symmetric. There was no dysmetria, there was no drift. Her gait was normal.     Laboratory Data: Drawn 07/29/13, Keppra  level 70 and Vimpat  15    Impression:     1. 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   2. Episodes of teeth clenching and left arm sensations in the past, likely nonepileptic.   3. Migraines.   4. Hypothyroidism.   5. Concerned that symptoms may be related to too much thyroid  replacement.     Recommendations:     1. Continue Vimpat  200 mg bid and   2. Continue Levetiracetam  1125 mg.   3. Call for any questions, problems or worsening of seizures.   4. Follow-up in this clinic in six months     I personally performed the service.     MELISSA T SCHRODER, PA    I was personally with the patient for 30 minutes. More than 50% of this time was spent doing counseling.

## 2014-02-17 ENCOUNTER — Other Ambulatory Visit (HOSPITAL_COMMUNITY): Payer: Self-pay | Admitting: Orthopaedic Surgery

## 2014-02-22 ENCOUNTER — Encounter (HOSPITAL_COMMUNITY): Payer: Self-pay | Admitting: Pharmacy Technician

## 2014-02-23 ENCOUNTER — Other Ambulatory Visit (HOSPITAL_COMMUNITY): Payer: Self-pay | Admitting: *Deleted

## 2014-02-23 NOTE — Patient Instructions (Addendum)
20     Your procedure is scheduled on:  Friday 03/05/2014  Report to [facility] Main Entrance and follow signs to Short Stay  at  0530 AM.  Call this number if you have problems the night before or morning of surgery:   [phone]   Remember:          Do not eat food or drink liquids AFTER MIDNIGHT!  Take these medicines the morning of surgery with A SIP OF WATER:  Levothyroxine, Keppra, Lacosamide (Vimpat)    [facility] IS NOT RESPONSIBLE FOR ANY BELONGINGS OR VALUABLES BROUGHT TO HOSPITAL.  Marland Kitchen.  Leave suitcase in the car. After surgery it may be brought to your room.  For patients admitted to the hospital, checkout time is 11:00 AM the day of              Discharge.    DO NOT WEAR  JEWELRY,MAKE-UP,LOTIONS,POWDERS,PERFUMES,CONTACTS , DENTURES OR BRIDGEWORK ,AND DO NOT WEAR FALSE EYELASHES                                    Patients discharged the day of surgery will not be allowed to drive home.  If going home the same day of surgery, must have someone stay with you  first 24 hrs.at home and arrange for someone to drive you home from the Hospital.                          YOUR DRIVER IS: N/A   Special Instructions:              Please read over the following fact sheets that you were given:             1. [place] PREPARING FOR SURGERY SHEET              2.INCENTIVE SPIROMETRY                                                        [facility] - Preparing for Surgery Before surgery, you can play an important role.  Because skin is not sterile, your skin needs to be as free of germs as possible.  You can reduce the number of germs on your skin by washing with CHG (chlorahexidine gluconate) soap before surgery.  CHG is an antiseptic cleaner which kills germs and bonds with the skin to continue killing germs even after washing. Please DO NOT use if you have an allergy to CHG or antibacterial soaps.  If your skin becomes reddened/irritated stop using the CHG and inform your  nurse when you arrive at Short Stay. Do not shave (including legs and underarms) for at least 48 hours prior to the first CHG shower.  You may shave your face/neck. Please follow these instructions carefully:  1.  Shower with CHG Soap the night before surgery and the  morning of Surgery.  2.  If you choose to wash your hair, wash your hair first as usual with your  normal  shampoo.  3.  After you shampoo, rinse your hair and body thoroughly to remove the  shampoo.  4.  Use CHG as you would any other liquid soap.  You can apply chg directly  to the skin and wash                       Gently with a scrungie or clean washcloth.  5.  Apply the CHG Soap to your body ONLY FROM THE NECK DOWN.   Do not use on face/ open                           Wound or open sores. Avoid contact with eyes, ears mouth and genitals (private parts).                       Wash face,  Genitals (private parts) with your normal soap.             6.  Wash thoroughly, paying special attention to the area where your surgery  will be performed.  7.  Thoroughly rinse your body with warm water from the neck down.  8.  DO NOT shower/wash with your normal soap after using and rinsing off  the CHG Soap.                9.  Pat yourself dry with a clean towel.            10.  Wear clean pajamas.            11.  Place clean sheets on your bed the night of your first shower and do not  sleep with pets. Day of Surgery : Do not apply any lotions/deodorants the morning of surgery.  Please wear clean clothes to the hospital/surgery center.  FAILURE TO FOLLOW THESE INSTRUCTIONS MAY RESULT IN THE CANCELLATION OF YOUR SURGERY PATIENT SIGNATURE_________________________________  NURSE SIGNATURE__________________________________  ________________________________________________________________________   Adam Phenix  An incentive spirometer is a tool that can help keep your lungs clear and active. This tool  measures how well you are filling your lungs with each breath. Taking long deep breaths may help reverse or decrease the chance of developing breathing (pulmonary) problems (especially infection) following:  A long period of time when you are unable to move or be active. BEFORE THE PROCEDURE   If the spirometer includes an indicator to show your best effort, your nurse or respiratory therapist will set it to a desired goal.  If possible, sit up straight or lean slightly forward. Try not to slouch.  Hold the incentive spirometer in an upright position. INSTRUCTIONS FOR USE  1. Sit on the edge of your bed if possible, or sit up as far as you can in bed or on a chair. 2. Hold the incentive spirometer in an upright position. 3. Breathe out normally. 4. Place the mouthpiece in your mouth and seal your lips tightly around it. 5. Breathe in slowly and as deeply as possible, raising the piston or the ball toward the top of the column. 6. Hold your breath for 3-5 seconds or for as long as possible. Allow the piston or ball to fall to the bottom of the column. 7. Remove the mouthpiece from your mouth and breathe out normally. 8. Rest for a few seconds and repeat Steps 1 through 7 at least 10 times every 1-2 hours when you are awake. Take your time and take a few normal breaths between deep breaths. 9. The spirometer may include an indicator to show  your best effort. Use the indicator as a goal to work toward during each repetition. 10. After each set of 10 deep breaths, practice coughing to be sure your lungs are clear. If you have an incision (the cut made at the time of surgery), support your incision when coughing by placing a pillow or rolled up towels firmly against it. Once you are able to get out of bed, walk around indoors and cough well. You may stop using the incentive spirometer when instructed by your caregiver.  RISKS AND COMPLICATIONS  Take your time so you do not get dizzy or  light-headed.  If you are in pain, you may need to take or ask for pain medication before doing incentive spirometry. It is harder to take a deep breath if you are having pain. AFTER USE  Rest and breathe slowly and easily.  It can be helpful to keep track of a log of your progress. Your caregiver can provide you with a simple table to help with this. If you are using the spirometer at home, follow these instructions: SEEK MEDICAL CARE IF:   You are having difficultly using the spirometer.  You have trouble using the spirometer as often as instructed.  Your pain medication is not giving enough relief while using the spirometer.  You develop fever of 100.5 F (38.1 C) or higher. SEEK IMMEDIATE MEDICAL CARE IF:   You cough up bloody sputum that had not been present before.  You develop fever of 102 F (38.9 C) or greater.  You develop worsening pain at or near the incision site. MAKE SURE YOU:   Understand these instructions.  Will watch your condition.  Will get help right away if you are not doing well or get worse. Document Released: 09/24/2006 Document Revised: 08/06/2011 Document Reviewed: 11/25/2006 Exit[employer] Patient Information 2014 ExitCare, [place].   ________________________________________________________________________  WHAT IS A BLOOD TRANSFUSION? Blood Transfusion Information  A transfusion is the replacement of blood or some of its parts. Blood is made up of multiple cells which provide different functions.  Red blood cells carry oxygen and are used for blood loss replacement.  White blood cells fight against infection.  Platelets control bleeding.  Plasma helps clot blood.  Other blood products are available for specialized needs, such as hemophilia or other clotting disorders. BEFORE THE TRANSFUSION  Who gives blood for transfusions?   Healthy volunteers who are fully evaluated to make sure their blood is safe. This is blood bank  blood. Transfusion therapy is the safest it has ever been in the practice of medicine. Before blood is taken from a donor, a complete history is taken to make sure that person has no history of diseases nor engages in risky social behavior (examples are intravenous drug use or sexual activity with multiple partners). The donor's travel history is screened to minimize risk of transmitting infections, such as malaria. The donated blood is tested for signs of infectious diseases, such as HIV and hepatitis. The blood is then tested to be sure it is compatible with you in order to minimize the chance of a transfusion reaction. If you or a relative donates blood, this is often done in anticipation of surgery and is not appropriate for emergency situations. It takes many days to process the donated blood. RISKS AND COMPLICATIONS Although transfusion therapy is very safe and saves many lives, the main dangers of transfusion include:   Getting an infectious disease.  Developing a transfusion reaction. This is an allergic reaction to  something in the blood you were given. Every precaution is taken to prevent this. The decision to have a blood transfusion has been considered carefully by your caregiver before blood is given. Blood is not given unless the benefits outweigh the risks. AFTER THE TRANSFUSION  Right after receiving a blood transfusion, you will usually feel much better and more energetic. This is especially true if your red blood cells have gotten low (anemic). The transfusion raises the level of the red blood cells which carry oxygen, and this usually causes an energy increase.  The nurse administering the transfusion will monitor you carefully for complications. HOME CARE INSTRUCTIONS  No special instructions are needed after a transfusion. You may find your energy is better. Speak with your caregiver about any limitations on activity for underlying diseases you may have. SEEK MEDICAL CARE IF:    Your condition is not improving after your transfusion.  You develop redness or irritation at the intravenous (IV) site. SEEK IMMEDIATE MEDICAL CARE IF:  Any of the following symptoms occur over the next 12 hours:  Shaking chills.  You have a temperature by mouth above 102 F (38.9 C), not controlled by medicine.  Chest, back, or muscle pain.  People around you feel you are not acting correctly or are confused.  Shortness of breath or difficulty breathing.  Dizziness and fainting.  You get a rash or develop hives.  You have a decrease in urine output.  Your urine turns a dark color or changes to pink, red, or brown. Any of the following symptoms occur over the next 10 days:  You have a temperature by mouth above 102 F (38.9 C), not controlled by medicine.  Shortness of breath.  Weakness after normal activity.  The white part of the eye turns yellow (jaundice).  You have a decrease in the amount of urine or are urinating less often.  Your urine turns a dark color or changes to pink, red, or brown. Document Released: 05/11/2000 Document Revised: 08/06/2011 Document Reviewed: 12/29/2007 [facility] Patient Information 2014 [place], [place].  _______________________________________________________________________

## 2014-02-24 ENCOUNTER — Encounter (HOSPITAL_COMMUNITY)
Admission: RE | Admit: 2014-02-24 | Discharge: 2014-02-24 | Disposition: A | Discharge status: Home / Self Care | Payer: Managed Care, Other (non HMO) | Source: Ambulatory Visit | Attending: Orthopaedic Surgery | Admitting: Orthopaedic Surgery

## 2014-02-24 ENCOUNTER — Ambulatory Visit (HOSPITAL_COMMUNITY)
Admission: RE | Admit: 2014-02-24 | Discharge: 2014-02-24 | Disposition: A | Discharge status: Home / Self Care | Payer: Managed Care, Other (non HMO) | Source: Ambulatory Visit | Attending: Anesthesiology | Admitting: Anesthesiology

## 2014-02-24 ENCOUNTER — Encounter (HOSPITAL_COMMUNITY): Payer: Self-pay

## 2014-02-24 DIAGNOSIS — I10 Essential (primary) hypertension: Secondary | ICD-10-CM | POA: Insufficient documentation

## 2014-02-24 DIAGNOSIS — I498 Other specified cardiac arrhythmias: Secondary | ICD-10-CM | POA: Diagnosis not present

## 2014-02-24 DIAGNOSIS — Z01818 Encounter for other preprocedural examination: Secondary | ICD-10-CM | POA: Diagnosis present

## 2014-02-24 LAB — URINALYSIS, ROUTINE W REFLEX MICROSCOPIC
BILIRUBIN URINE: NEGATIVE
GLUCOSE, UA: NEGATIVE mg/dL
KETONES UR: NEGATIVE mg/dL
LEUKOCYTES UA: NEGATIVE
Nitrite: NEGATIVE
PH: 8 (ref 5.0–8.0)
Protein, ur: NEGATIVE mg/dL
Specific Gravity, Urine: 1.018 (ref 1.005–1.030)
Urobilinogen, UA: 0.2 mg/dL (ref 0.0–1.0)

## 2014-02-24 LAB — URINE MICROSCOPIC-ADD ON

## 2014-02-24 LAB — SURGICAL PCR SCREEN
MRSA, PCR: NEGATIVE
Staphylococcus aureus: NEGATIVE

## 2014-02-24 LAB — CBC
HCT: 38.3 % (ref 36.0–46.0)
Hemoglobin: 12.7 g/dL (ref 12.0–15.0)
MCH: 29.9 pg (ref 26.0–34.0)
MCHC: 33.2 g/dL (ref 30.0–36.0)
MCV: 90.1 fL (ref 78.0–100.0)
PLATELETS: 306 10*3/uL (ref 150–400)
RBC: 4.25 MIL/uL (ref 3.87–5.11)
RDW: 12.6 % (ref 11.5–15.5)
WBC: 5.4 10*3/uL (ref 4.0–10.5)

## 2014-02-24 LAB — BASIC METABOLIC PANEL
Anion gap: 13 (ref 5–15)
BUN: 17 mg/dL (ref 6–23)
CALCIUM: 9.5 mg/dL (ref 8.4–10.5)
CO2: 27 mEq/L (ref 19–32)
Chloride: 103 mEq/L (ref 96–112)
Creatinine, Ser: 0.76 mg/dL (ref 0.50–1.10)
GFR calc Af Amer: >90 mL/min (ref >90)
Glucose, Bld: 100 mg/dL — ABNORMAL HIGH (ref 70–99)
Potassium: 4.2 mEq/L (ref 3.7–5.3)
Sodium: 143 mEq/L (ref 137–147)

## 2014-02-24 LAB — PROTIME-INR
INR: 1.01 (ref 0.00–1.49)
Prothrombin Time: 13.3 seconds (ref 11.6–15.2)

## 2014-02-24 LAB — APTT: APTT: 27 s (ref 24–37)

## 2014-02-24 NOTE — Progress Notes (Signed)
02/24/14 1439  OBSTRUCTIVE SLEEP APNEA  Have you ever been diagnosed with sleep apnea through a sleep study? No  Do you snore loudly (loud enough to be heard through closed doors)?  0  Do you often feel tired, fatigued, or sleepy during the daytime? 1  Has anyone observed you stop breathing during your sleep? 0  Do you have, or are you being treated for high blood pressure? 0  BMI more than 35 kg/m2? 1  Age over 59 years old? 1  Neck circumference greater than 40 cm/16 inches? 1  Gender: 0  Obstructive Sleep Apnea Score 4  Score 4 or greater  Results sent to PCP

## 2014-03-05 ENCOUNTER — Inpatient Hospital Stay (HOSPITAL_COMMUNITY): Payer: Managed Care, Other (non HMO)

## 2014-03-05 ENCOUNTER — Inpatient Hospital Stay (HOSPITAL_COMMUNITY)
Admission: RE | Admit: 2014-03-05 | Discharge: 2014-03-07 | DRG: 470 | Disposition: A | Discharge status: Home Health | Payer: Managed Care, Other (non HMO) | Source: Ambulatory Visit | Attending: Orthopaedic Surgery | Admitting: Orthopaedic Surgery

## 2014-03-05 ENCOUNTER — Encounter (HOSPITAL_COMMUNITY): Payer: Managed Care, Other (non HMO) | Admitting: Anesthesiology

## 2014-03-05 ENCOUNTER — Inpatient Hospital Stay (HOSPITAL_COMMUNITY): Payer: Managed Care, Other (non HMO) | Admitting: Anesthesiology

## 2014-03-05 ENCOUNTER — Encounter (HOSPITAL_COMMUNITY)
Admission: RE | Disposition: A | Discharge status: Home Health | Payer: Self-pay | Source: Ambulatory Visit | Attending: Orthopaedic Surgery

## 2014-03-05 ENCOUNTER — Encounter (HOSPITAL_COMMUNITY): Payer: Self-pay | Admitting: *Deleted

## 2014-03-05 DIAGNOSIS — Z96651 Presence of right artificial knee joint: Secondary | ICD-10-CM

## 2014-03-05 DIAGNOSIS — M179 Osteoarthritis of knee, unspecified: Principal | ICD-10-CM | POA: Diagnosis present

## 2014-03-05 DIAGNOSIS — G40909 Epilepsy, unspecified, not intractable, without status epilepticus: Secondary | ICD-10-CM | POA: Diagnosis present

## 2014-03-05 DIAGNOSIS — E039 Hypothyroidism, unspecified: Secondary | ICD-10-CM | POA: Diagnosis present

## 2014-03-05 DIAGNOSIS — E785 Hyperlipidemia, unspecified: Secondary | ICD-10-CM | POA: Diagnosis present

## 2014-03-05 DIAGNOSIS — Z96659 Presence of unspecified artificial knee joint: Secondary | ICD-10-CM

## 2014-03-05 DIAGNOSIS — I10 Essential (primary) hypertension: Secondary | ICD-10-CM | POA: Diagnosis present

## 2014-03-05 DIAGNOSIS — M1711 Unilateral primary osteoarthritis, right knee: Secondary | ICD-10-CM

## 2014-03-05 DIAGNOSIS — Z6841 Body Mass Index (BMI) 40.0 and over, adult: Secondary | ICD-10-CM

## 2014-03-05 DIAGNOSIS — Z7982 Long term (current) use of aspirin: Secondary | ICD-10-CM | POA: Diagnosis not present

## 2014-03-05 DIAGNOSIS — M25561 Pain in right knee: Secondary | ICD-10-CM | POA: Diagnosis present

## 2014-03-05 HISTORY — PX: TOTAL KNEE ARTHROPLASTY: SHX125

## 2014-03-05 LAB — TYPE AND SCREEN
ABO/RH(D): O POS
Antibody Screen: NEGATIVE

## 2014-03-05 SURGERY — ARTHROPLASTY, KNEE, TOTAL
Anesthesia: General | Site: Knee | Laterality: Right

## 2014-03-05 MED ORDER — METOCLOPRAMIDE HCL 10 MG PO TABS
5.0000 mg | ORAL_TABLET | Freq: Three times a day (TID) | ORAL | Status: DC | PRN
  Filled 2014-03-05: qty 1

## 2014-03-05 MED ORDER — DIPHENHYDRAMINE HCL 12.5 MG/5ML PO ELIX: 12.5000 mg | ORAL_SOLUTION | ORAL | Status: DC | PRN

## 2014-03-05 MED ORDER — HYDROMORPHONE HCL 1 MG/ML IJ SOLN
INTRAMUSCULAR | Status: AC
  Administered 2014-03-05: 1 mg
  Filled 2014-03-05: qty 1

## 2014-03-05 MED ORDER — FENTANYL CITRATE 0.05 MG/ML IJ SOLN
INTRAMUSCULAR | Status: DC | PRN
Start: 2014-03-05 — End: 2014-03-05
  Administered 2014-03-05 (×4): 50 ug via INTRAVENOUS

## 2014-03-05 MED ORDER — DEXAMETHASONE SODIUM PHOSPHATE 10 MG/ML IJ SOLN
INTRAMUSCULAR | Status: AC
  Filled 2014-03-05: qty 1

## 2014-03-05 MED ORDER — OXYCODONE HCL 5 MG PO TABS: 5.0000 mg | ORAL_TABLET | Freq: Once | ORAL | Status: DC | PRN

## 2014-03-05 MED ORDER — PROPOFOL 10 MG/ML IV BOLUS
INTRAVENOUS | Status: DC | PRN
  Administered 2014-03-05: 80 mg via INTRAVENOUS
  Administered 2014-03-05: 40 mg via INTRAVENOUS

## 2014-03-05 MED ORDER — ACETAMINOPHEN 650 MG RE SUPP: 650.0000 mg | Freq: Four times a day (QID) | RECTAL | Status: DC | PRN

## 2014-03-05 MED ORDER — OXYCODONE HCL 5 MG PO TABS
5.0000 mg | ORAL_TABLET | ORAL | Status: DC | PRN
  Administered 2014-03-05 – 2014-03-07 (×12): 10 mg via ORAL
  Filled 2014-03-05 (×12): qty 2

## 2014-03-05 MED ORDER — LACTATED RINGERS IV SOLN
INTRAVENOUS | Status: DC | PRN
  Administered 2014-03-05 (×3): via INTRAVENOUS

## 2014-03-05 MED ORDER — CLONAZEPAM 0.5 MG PO TABS: 0.5000 mg | ORAL_TABLET | Freq: Two times a day (BID) | ORAL | Status: DC | PRN

## 2014-03-05 MED ORDER — MIDAZOLAM HCL 5 MG/5ML IJ SOLN
INTRAMUSCULAR | Status: DC | PRN
  Administered 2014-03-05 (×2): 1 mg via INTRAVENOUS

## 2014-03-05 MED ORDER — LACTATED RINGERS IV SOLN
INTRAVENOUS | Status: DC
Start: 2014-03-05 — End: 2014-03-07
  Administered 2014-03-05: 18:00:00 via INTRAVENOUS

## 2014-03-05 MED ORDER — PROPOFOL 10 MG/ML IV BOLUS
INTRAVENOUS | Status: AC
  Filled 2014-03-05: qty 20

## 2014-03-05 MED ORDER — LEVOTHYROXINE SODIUM 137 MCG PO TABS
68.2000 ug | ORAL_TABLET | Freq: Every day | ORAL | Status: DC
  Administered 2014-03-06: 68.2 ug via ORAL
  Filled 2014-03-05 (×4): qty 0.5

## 2014-03-05 MED ORDER — POLYETHYLENE GLYCOL 3350 17 G PO PACK
17.0000 g | PACK | Freq: Every day | ORAL | Status: DC | PRN
  Filled 2014-03-05 (×2): qty 1

## 2014-03-05 MED ORDER — PHENOL 1.4 % MT LIQD
1.0000 | OROMUCOSAL | Status: DC | PRN
  Filled 2014-03-05: qty 177

## 2014-03-05 MED ORDER — LEVETIRACETAM 750 MG PO TABS
1500.0000 mg | ORAL_TABLET | Freq: Every morning | ORAL | Status: DC
  Administered 2014-03-06 – 2014-03-07 (×2): 1500 mg via ORAL
  Filled 2014-03-05 (×2): qty 2

## 2014-03-05 MED ORDER — KETOROLAC TROMETHAMINE 30 MG/ML IJ SOLN
INTRAMUSCULAR | Status: AC
  Administered 2014-03-05: 30 mg via INTRAVENOUS
  Filled 2014-03-05: qty 1

## 2014-03-05 MED ORDER — METHOCARBAMOL 500 MG PO TABS
500.0000 mg | ORAL_TABLET | Freq: Four times a day (QID) | ORAL | Status: DC | PRN
  Administered 2014-03-06 – 2014-03-07 (×5): 500 mg via ORAL
  Filled 2014-03-05 (×6): qty 1

## 2014-03-05 MED ORDER — METHOCARBAMOL 1000 MG/10ML IJ SOLN
500.0000 mg | Freq: Four times a day (QID) | INTRAMUSCULAR | Status: DC | PRN
  Administered 2014-03-05 (×2): 500 mg via INTRAVENOUS
  Filled 2014-03-05: qty 5

## 2014-03-05 MED ORDER — FENTANYL CITRATE 0.05 MG/ML IJ SOLN
INTRAMUSCULAR | Status: AC
  Filled 2014-03-05: qty 2

## 2014-03-05 MED ORDER — DOCUSATE SODIUM 100 MG PO CAPS
100.0000 mg | ORAL_CAPSULE | Freq: Two times a day (BID) | ORAL | Status: DC
  Administered 2014-03-05 – 2014-03-07 (×4): 100 mg via ORAL
  Filled 2014-03-05 (×5): qty 1

## 2014-03-05 MED ORDER — ZOLPIDEM TARTRATE 5 MG PO TABS: 5.0000 mg | ORAL_TABLET | Freq: Every evening | ORAL | Status: DC | PRN

## 2014-03-05 MED ORDER — ACETAMINOPHEN 10 MG/ML IV SOLN
1000.0000 mg | Freq: Once | INTRAVENOUS | Status: AC
  Administered 2014-03-05: 1000 mg via INTRAVENOUS
  Filled 2014-03-05: qty 100

## 2014-03-05 MED ORDER — CEFAZOLIN SODIUM 1-5 GM-% IV SOLN
1.0000 g | Freq: Four times a day (QID) | INTRAVENOUS | Status: AC
  Administered 2014-03-05 (×2): 1 g via INTRAVENOUS
  Filled 2014-03-05 (×2): qty 50

## 2014-03-05 MED ORDER — TRIAMTERENE-HCTZ 37.5-25 MG PO CAPS
1.0000 | ORAL_CAPSULE | Freq: Every day | ORAL | Status: DC
  Filled 2014-03-05: qty 1

## 2014-03-05 MED ORDER — RIVAROXABAN 10 MG PO TABS
10.0000 mg | ORAL_TABLET | Freq: Every day | ORAL | Status: DC
  Administered 2014-03-06 – 2014-03-07 (×2): 10 mg via ORAL
  Filled 2014-03-05 (×3): qty 1

## 2014-03-05 MED ORDER — MIDAZOLAM HCL 2 MG/2ML IJ SOLN
INTRAMUSCULAR | Status: AC
  Filled 2014-03-05: qty 2

## 2014-03-05 MED ORDER — 0.9 % SODIUM CHLORIDE (POUR BTL) OPTIME
TOPICAL | Status: DC | PRN
  Administered 2014-03-05: 1000 mL

## 2014-03-05 MED ORDER — HYDROMORPHONE HCL 1 MG/ML IJ SOLN
INTRAMUSCULAR | Status: AC
  Filled 2014-03-05: qty 1

## 2014-03-05 MED ORDER — ONDANSETRON HCL 4 MG PO TABS: 4.0000 mg | ORAL_TABLET | Freq: Four times a day (QID) | ORAL | Status: DC | PRN

## 2014-03-05 MED ORDER — ROSUVASTATIN CALCIUM 5 MG PO TABS
5.0000 mg | ORAL_TABLET | Freq: Every day | ORAL | Status: DC
  Administered 2014-03-06 – 2014-03-07 (×2): 5 mg via ORAL
  Filled 2014-03-05 (×2): qty 1

## 2014-03-05 MED ORDER — ACETAMINOPHEN 325 MG PO TABS
650.0000 mg | ORAL_TABLET | Freq: Four times a day (QID) | ORAL | Status: DC | PRN
  Administered 2014-03-06: 650 mg via ORAL
  Filled 2014-03-05: qty 2

## 2014-03-05 MED ORDER — METOCLOPRAMIDE HCL 5 MG/ML IJ SOLN: 5.0000 mg | Freq: Three times a day (TID) | INTRAMUSCULAR | Status: DC | PRN

## 2014-03-05 MED ORDER — SODIUM CHLORIDE 0.9 % IR SOLN
Status: DC | PRN
  Administered 2014-03-05: 2000 mL

## 2014-03-05 MED ORDER — ALUM & MAG HYDROXIDE-SIMETH 200-200-20 MG/5ML PO SUSP
30.0000 mL | ORAL | Status: DC | PRN
  Administered 2014-03-06 (×2): 30 mL via ORAL
  Filled 2014-03-05 (×2): qty 30

## 2014-03-05 MED ORDER — OXYCODONE HCL 5 MG/5ML PO SOLN
5.0000 mg | Freq: Once | ORAL | Status: DC | PRN
  Filled 2014-03-05: qty 5

## 2014-03-05 MED ORDER — HYDROMORPHONE HCL 1 MG/ML IJ SOLN
1.0000 mg | INTRAMUSCULAR | Status: DC | PRN
  Administered 2014-03-05: 1 mg via INTRAVENOUS
  Filled 2014-03-05: qty 1

## 2014-03-05 MED ORDER — KETOROLAC TROMETHAMINE 30 MG/ML IJ SOLN: 15.0000 mg | Freq: Once | INTRAMUSCULAR | Status: AC | PRN

## 2014-03-05 MED ORDER — ADULT MULTIVITAMIN W/MINERALS CH
1.0000 | ORAL_TABLET | Freq: Every day | ORAL | Status: DC
  Administered 2014-03-06 – 2014-03-07 (×2): 1 via ORAL
  Filled 2014-03-05 (×2): qty 1

## 2014-03-05 MED ORDER — METOPROLOL SUCCINATE ER 25 MG PO TB24
25.0000 mg | ORAL_TABLET | Freq: Every day | ORAL | Status: DC
  Administered 2014-03-05 – 2014-03-06 (×2): 25 mg via ORAL
  Filled 2014-03-05 (×3): qty 1

## 2014-03-05 MED ORDER — MENTHOL 3 MG MT LOZG
1.0000 | LOZENGE | OROMUCOSAL | Status: DC | PRN
  Filled 2014-03-05: qty 9

## 2014-03-05 MED ORDER — CEFAZOLIN SODIUM-DEXTROSE 2-3 GM-% IV SOLR
INTRAVENOUS | Status: AC
  Filled 2014-03-05: qty 50

## 2014-03-05 MED ORDER — PROPOFOL INFUSION 10 MG/ML OPTIME
INTRAVENOUS | Status: DC | PRN
  Administered 2014-03-05: 120 ug/kg/min via INTRAVENOUS

## 2014-03-05 MED ORDER — ONDANSETRON HCL 4 MG/2ML IJ SOLN
INTRAMUSCULAR | Status: DC | PRN
  Administered 2014-03-05: 4 mg via INTRAVENOUS

## 2014-03-05 MED ORDER — LACOSAMIDE 50 MG PO TABS
200.0000 mg | ORAL_TABLET | Freq: Two times a day (BID) | ORAL | Status: DC
  Administered 2014-03-05 – 2014-03-07 (×4): 200 mg via ORAL
  Filled 2014-03-05 (×4): qty 4

## 2014-03-05 MED ORDER — PROMETHAZINE HCL 25 MG/ML IJ SOLN: 6.2500 mg | INTRAMUSCULAR | Status: DC | PRN

## 2014-03-05 MED ORDER — CEFAZOLIN SODIUM-DEXTROSE 2-3 GM-% IV SOLR
2.0000 g | INTRAVENOUS | Status: AC
  Administered 2014-03-05: 2 g via INTRAVENOUS

## 2014-03-05 MED ORDER — LEVETIRACETAM 750 MG PO TABS
2000.0000 mg | ORAL_TABLET | Freq: Every day | ORAL | Status: DC
  Administered 2014-03-05 – 2014-03-06 (×2): 2000 mg via ORAL
  Filled 2014-03-05 (×4): qty 1

## 2014-03-05 MED ORDER — HYDROMORPHONE HCL 1 MG/ML IJ SOLN
1.0000 mg | INTRAMUSCULAR | Status: DC | PRN
  Administered 2014-03-05: 1 mg via INTRAVENOUS

## 2014-03-05 MED ORDER — ONDANSETRON HCL 4 MG/2ML IJ SOLN
INTRAMUSCULAR | Status: AC
  Filled 2014-03-05: qty 2

## 2014-03-05 MED ORDER — DEXAMETHASONE SODIUM PHOSPHATE 10 MG/ML IJ SOLN
INTRAMUSCULAR | Status: DC | PRN
  Administered 2014-03-05: 10 mg via INTRAVENOUS

## 2014-03-05 MED ORDER — TRIAMTERENE-HCTZ 37.5-25 MG PO TABS
1.0000 | ORAL_TABLET | Freq: Every day | ORAL | Status: DC
  Administered 2014-03-06 – 2014-03-07 (×2): 1 via ORAL
  Filled 2014-03-05 (×2): qty 1

## 2014-03-05 MED ORDER — MEPERIDINE HCL 50 MG/ML IJ SOLN: 6.2500 mg | INTRAMUSCULAR | Status: DC | PRN

## 2014-03-05 MED ORDER — ONDANSETRON HCL 4 MG/2ML IJ SOLN: 4.0000 mg | Freq: Four times a day (QID) | INTRAMUSCULAR | Status: DC | PRN

## 2014-03-05 MED ORDER — TRANEXAMIC ACID 100 MG/ML IV SOLN
1000.0000 mg | INTRAVENOUS | Status: AC
  Administered 2014-03-05: 1000 mg via INTRAVENOUS
  Filled 2014-03-05: qty 10

## 2014-03-05 MED ORDER — OXYCODONE HCL ER 20 MG PO T12A
20.0000 mg | EXTENDED_RELEASE_TABLET | Freq: Two times a day (BID) | ORAL | Status: DC
  Administered 2014-03-05 – 2014-03-06 (×2): 20 mg via ORAL
  Filled 2014-03-05 (×4): qty 1

## 2014-03-05 MED ORDER — HYDROMORPHONE HCL 1 MG/ML IJ SOLN
0.2500 mg | INTRAMUSCULAR | Status: DC | PRN
  Administered 2014-03-05: 0.5 mg via INTRAVENOUS

## 2014-03-05 SURGICAL SUPPLY — 59 items
BAG ZIPLOCK 12X15 (MISCELLANEOUS) ×2 IMPLANT
BANDAGE ELASTIC 6 VELCRO ST LF (GAUZE/BANDAGES/DRESSINGS) ×2 IMPLANT
BANDAGE ESMARK 6X9 LF (GAUZE/BANDAGES/DRESSINGS) ×1 IMPLANT
BENZOIN TINCTURE PRP APPL 2/3 (GAUZE/BANDAGES/DRESSINGS) ×2 IMPLANT
BLADE SAG 18X100X1.27 (BLADE) ×2 IMPLANT
BNDG ESMARK 6X9 LF (GAUZE/BANDAGES/DRESSINGS) ×2
BOWL SMART MIX CTS (DISPOSABLE) ×2 IMPLANT
CEMENT BONE 1-PACK (Cement) ×4 IMPLANT
CUFF TOURN SGL QUICK 34 (TOURNIQUET CUFF) ×1
CUFF TRNQT CYL 34X4X40X1 (TOURNIQUET CUFF) ×1 IMPLANT
DRAPE EXTREMITY TIBURON (DRAPES) ×2 IMPLANT
DRAPE POUCH INSTRU U-SHP 10X18 (DRAPES) ×2 IMPLANT
DRAPE SHEET LG 3/4 BI-LAMINATE (DRAPES) IMPLANT
DRAPE U-SHAPE 47X51 STRL (DRAPES) ×2 IMPLANT
DRSG PAD ABDOMINAL 8X10 ST (GAUZE/BANDAGES/DRESSINGS) ×2 IMPLANT
DURAPREP 26ML APPLICATOR (WOUND CARE) ×2 IMPLANT
ELECT REM PT RETURN 9FT ADLT (ELECTROSURGICAL) ×2
ELECTRODE REM PT RTRN 9FT ADLT (ELECTROSURGICAL) ×1 IMPLANT
EVACUATOR 1/8 PVC DRAIN (DRAIN) IMPLANT
FACESHIELD WRAPAROUND (MASK) ×8 IMPLANT
GAUZE SPONGE 4X4 12PLY STRL (GAUZE/BANDAGES/DRESSINGS) ×2 IMPLANT
GAUZE XEROFORM 1X8 LF (GAUZE/BANDAGES/DRESSINGS) IMPLANT
GLOVE BIO SURGEON STRL SZ7.5 (GLOVE) ×4 IMPLANT
GLOVE BIO SURGEON STRL SZ8 (GLOVE) ×2 IMPLANT
GLOVE BIOGEL PI IND STRL 7.5 (GLOVE) ×1 IMPLANT
GLOVE BIOGEL PI IND STRL 8 (GLOVE) ×2 IMPLANT
GLOVE BIOGEL PI IND STRL 8.5 (GLOVE) ×1 IMPLANT
GLOVE BIOGEL PI INDICATOR 7.5 (GLOVE) ×1
GLOVE BIOGEL PI INDICATOR 8 (GLOVE) ×2
GLOVE BIOGEL PI INDICATOR 8.5 (GLOVE) ×1
GLOVE ECLIPSE 8.0 STRL XLNG CF (GLOVE) ×2 IMPLANT
GOWN STRL REUS W/TWL XL LVL3 (GOWN DISPOSABLE) ×8 IMPLANT
HANDPIECE INTERPULSE COAX TIP (DISPOSABLE) ×1
IMMOBILIZER KNEE 20 (SOFTGOODS) ×2
IMMOBILIZER KNEE 20 THIGH 36 (SOFTGOODS) ×1 IMPLANT
INSERT TIB 4 16XTL STAB + STRL (Insert) ×1 IMPLANT
KIT BASIN OR (CUSTOM PROCEDURE TRAY) ×2 IMPLANT
KNEE INSERT TIB 4X16 (Insert) ×1 IMPLANT
KNEE LEVEL 1C ×2 IMPLANT
NS IRRIG 1000ML POUR BTL (IV SOLUTION) ×2 IMPLANT
PACK TOTAL JOINT (CUSTOM PROCEDURE TRAY) ×2 IMPLANT
PADDING CAST COTTON 6X4 STRL (CAST SUPPLIES) ×2 IMPLANT
POSITIONER SURGICAL ARM (MISCELLANEOUS) ×2 IMPLANT
SET HNDPC FAN SPRY TIP SCT (DISPOSABLE) ×1 IMPLANT
SET PAD KNEE POSITIONER (MISCELLANEOUS) ×2 IMPLANT
STRIP CLOSURE SKIN 1/2X4 (GAUZE/BANDAGES/DRESSINGS) ×4 IMPLANT
SUCTION FRAZIER 12FR DISP (SUCTIONS) ×2 IMPLANT
SUT MNCRL AB 4-0 PS2 18 (SUTURE) ×2 IMPLANT
SUT VIC AB 0 CT1 27 (SUTURE) ×1
SUT VIC AB 0 CT1 27XBRD ANTBC (SUTURE) ×1 IMPLANT
SUT VIC AB 1 CT1 27 (SUTURE) ×3
SUT VIC AB 1 CT1 27XBRD ANTBC (SUTURE) ×3 IMPLANT
SUT VIC AB 2-0 CT1 27 (SUTURE) ×2
SUT VIC AB 2-0 CT1 TAPERPNT 27 (SUTURE) ×2 IMPLANT
TOWEL OR 17X26 10 PK STRL BLUE (TOWEL DISPOSABLE) ×2 IMPLANT
TOWEL OR NON WOVEN STRL DISP B (DISPOSABLE) ×2 IMPLANT
TRAY FOLEY CATH 14FRSI W/METER (CATHETERS) ×2 IMPLANT
WATER STERILE IRR 1500ML POUR (IV SOLUTION) ×2 IMPLANT
WRAP KNEE MAXI GEL POST OP (GAUZE/BANDAGES/DRESSINGS) ×2 IMPLANT

## 2014-03-05 NOTE — Transfer of Care (Signed)
Immediate Anesthesia Transfer of Care Note  Patient: Darlene Ford  Procedure(s) Performed: Procedure(s): RIGHT TOTAL KNEE ARTHROPLASTY (Right)  Patient Location: PACU  Anesthesia Type:General and Spinal  Level of Consciousness: awake, alert , oriented and patient cooperative  Airway & Oxygen Therapy: Patient Spontanous Breathing and Patient connected to face mask oxygen  Post-op Assessment: Report given to PACU RN and Post -op Vital signs reviewed and stable  Post vital signs: Reviewed and stable  Complications: No apparent anesthesia complications

## 2014-03-05 NOTE — Op Note (Signed)
NAMRia Bush:  Ford, Darlene           ACCOUNT NO.:  [id number]  MEDICAL RECORD NO.:  [id number]  LOCATION:  [facility]                         [facility]  PHYSICIAN:  Vanita PandaChristopher Y. 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  PROCEDURE:  Right total knee arthroplasty.  IMPLANTS:  Stryker Triathlon knee with size 4 femur, size 4 tibia universal base plate, size 16 constrained poly TS insert, size 29 patellar button.  SURGEON:  Vanita PandaChristopher Y. Magnus IvanBlackman, M.D.  ASSISTANT:  Richardean CanalGilbert Clark, P.A.  ANESTHESIA:  Spinal.  BLOOD LOSS:  Less than 100 mL.  TOURNIQUET TIME:  Just over 1 hour.  ANTIBIOTICS:  2 g IV Ancef.  COMPLICATIONS:  None.  INDICATIONS:  The patient is a 58 year old female, well known to me. She has severe varus deformity of her right knee with end-stage arthritis.  There is periarticular osteophytes, complete loss of her medial joint space, tricompartment arthritic changes throughout her knee, sclerotic changes and cystic changes as well.  Her mobility is limited.  Her quality of life has been affected as well as her daily activities.  At this point, she wished to proceed with a total knee arthroplasty.  She understands the difficulty of this case given her deformity, but also the risk of acute blood loss anemia, nerve and vessel injury, fracture, infection, and DVT.  She understands the goals are decreased pain, improved mobility, and overall improved quality of life.  PROCEDURE DESCRIPTION:  After informed consent was obtained, appropriate right knee was marked.  She was brought to the operating room and spinal anesthesia was obtained while she was on the table.  She was then  laid in a supine position.  Foley catheter was placed and then her right operative leg had a nonsterile tourniquet placed around the upper aspect of the right leg/thigh and her leg was prepped and draped with DuraPrep and sterile drapes including a sterile stockinette.  This included a sterile stockinette.  A time-out was called.  She was identified as correct patient, correct right knee.  We then used an Esmarch to wrap out the leg and the tourniquet was inflated to 300 mm of pressure.  We then made an incision over the patella and carried this proximally and distally.  We dissected down the knee joint and performed a medial parapatellar arthrotomy finding a large joint effusion and significant osteophytes of the knee.  It was very tight medially and I had to release medial tissue as much as possible to open up her knee because it was so tight.  We cleaned the knee of osteophytes and remnants of the medial and lateral meniscus as well as the ACL and PCL.  She had a significant flexion contracture as well.  With the knee in a flexed position using the extramedullary cutting guide for the tibia, we took 9 mm off the high side correcting for neutral slope and correcting for varus and valgus.  We then made this cut.  Attention was then turned to  the femur.  With the intramedullary guide on the femur placed, we set our cutting block at 10, taking a 10 mm distal femoral cut, setting for 5 degrees external rotator of the right knee.  We made this cut and brought her back down in extension and with extension block in place, she was in full extension.  We then cleaned the knee for further debris and posterior osteophytes.  We placed a femoral sizing guide next using the epicondylar axis as our guide and then chose a size 4 femur.  We placed our 4 in 1 cutting block and made our anterior and posterior cuts followed by our chamfer cuts.  We then made our femoral box cut on the back to the tibia and  set for a size 4 tibia and this was the correct coverage as well.  We then cut for this as well.  We then placed the trial size 4 femur and the trial tibial base plate.  I needed to go up several polys because she was so loose medially and I felt like it may have gotten into the medial collateral ligaments as well versus this releasing her so much medially because it was so tight before.  I then made a decision to go to universal base plate and constrained liner.  We drilled for this on the tibial tray.  We then made our patellar cut as well.  With all trial components in place, I was pleased with her stability and her range of motion and this included a 16-mm constrained polyethylene insert.  We then removed all trial components from the knee and copiously irrigated the knee with normal saline solution.  We then mixed our cement and cemented the real Stryker triathlon universal base plate, size 4 followed by the real size 4 femur.  We placed a size 16 constrained TS polyethylene insert and cemented the patellar button.  We then let the cement dry and harden and removed the cement debris from the knee.  We let the tourniquet down and hemostasis was obtained with electrocautery.  We then tightened up her medial tissue.  I did not find the complete tear of the medial collateral ligament.  We closed the joint capsule and there was at least a partial tear.  I repaired this with #1 Vicryl suture.  We then repaired the joint capsule with interrupted #1 Ethibond suture followed by 0 Vicryl in the deep tissue, 2-0 Vicryl in subcutaneous tissue, and 4-0 Monocryl subcuticular stitch. Steri-Strips were applied.  A well-padded sterile dressing was placed as well.  She was then taken off the operating table to recovery room in stable condition.  All final counts were correct.  There were no complications noted.  Of note, Richardean CanalGilbert Clark, P.A. assisted during the entire case and his assistance was crucial  for every portion of the case.     Vanita Pandahristopher Y. Magnus IvanBlackman, M.D.     CYB/MEDQ  D:  03/05/2014  T:  03/05/2014  Job:  [id number]

## 2014-03-05 NOTE — Evaluation (Signed)
Physical Therapy Evaluation Patient Details Name: Darlene Ford MRN: [id number] DOB: 04/27/1956 Today's Date: 03/05/2014   History of Present Illness  R TKR  Clinical Impression  Pt s/p R TKR presents with decreased R LE strength/ROM and post op pain limiting functional mobility.  Pt should progress well to d/c home with HHPT follow up and family assist.    Follow Up Recommendations Home health PT    Equipment Recommendations  None recommended by PT    Recommendations for Other Services OT consult     Precautions / Restrictions Precautions Precautions: Knee;Fall Required Braces or Orthoses: Knee Immobilizer - Right Knee Immobilizer - Right: Discontinue once straight leg raise with < 10 degree lag Restrictions Weight Bearing Restrictions: No Other Position/Activity Restrictions: WBAT      Mobility  Bed Mobility Overal bed mobility: Needs Assistance Bed Mobility: Supine to Sit;Sit to Supine     Supine to sit: Min assist Sit to supine: Min assist;Mod assist   General bed mobility comments: cues for sequence and use of L LE to self assist  Transfers Overall transfer level: Needs assistance Equipment used: Rolling walker (2 wheeled) Transfers: Sit to/from Stand Sit to Stand: Min assist         General transfer comment: cues for LE management and use of UEs to self assist  Ambulation/Gait Ambulation/Gait assistance: Min assist Ambulation Distance (Feet): 69 Feet Assistive device: Rolling walker (2 wheeled) Gait Pattern/deviations: Step-to pattern;Decreased step length - right;Decreased step length - left;Shuffle Gait velocity: decr   General Gait Details: cues for sequence, posture, position from [employer]  Stairs            Wheelchair Mobility    Modified Rankin (Stroke Patients Only)       Balance                                             Pertinent Vitals/Pain Pain Assessment: 0-10 Pain Score: 4  Pain Location: R  knee Pain Descriptors / Indicators: Aching;Sore Pain Intervention(s): Limited activity within patient's tolerance;Monitored during session;Premedicated before session;Ice applied    Home Living Family/patient expects to be discharged to:: Private residence Living Arrangements: Spouse/significant other Available Help at Discharge: Family Type of Home: House Home Access: Stairs to enter Entrance Stairs-Rails: None Entrance Stairs-Number of Steps: 1 Home Layout: Two level;Able to live on main level with bedroom/bathroom Home Equipment: Dan HumphreysWalker - 2 wheels      Prior Function Level of Independence: Independent               Hand Dominance   Dominant Hand: Right    Extremity/Trunk Assessment   Upper Extremity Assessment: Overall WFL for tasks assessed           Lower Extremity Assessment: RLE deficits/detail RLE Deficits / Details: 2+/5 quads with AAROM at knee -10 - 45    Cervical / Trunk Assessment: Normal  Communication   Communication: No difficulties  Cognition Arousal/Alertness: Awake/alert Behavior During Therapy: WFL for tasks assessed/performed Overall Cognitive Status: Within Functional Limits for tasks assessed                      General Comments      Exercises Total Joint Exercises Ankle Circles/Pumps: AROM;15 reps;Supine;Both Quad Sets: AROM;Both;10 reps;Supine Heel Slides: AAROM;Right;10 reps;Supine Straight Leg Raises: AAROM;Right;10 reps;Supine      Assessment/Plan  PT Assessment Patient needs continued PT services  PT Diagnosis Difficulty walking   PT Problem List Decreased strength;Decreased range of motion;Decreased activity tolerance;Decreased mobility;Decreased knowledge of use of DME;Obesity;Pain  PT Treatment Interventions DME instruction;Gait training;Stair training;Functional mobility training;Therapeutic activities;Therapeutic exercise;Patient/family education   PT Goals (Current goals can be found in the Care Plan  section) Acute Rehab PT Goals Patient Stated Goal: Resume previous lifestyle with decreased pain PT Goal Formulation: With patient Time For Goal Achievement: 03/12/14 Potential to Achieve Goals: Good    Frequency 7X/week   Barriers to discharge        Co-evaluation               End of Session Equipment Utilized During Treatment: Gait belt;Right knee immobilizer Activity Tolerance: Patient tolerated treatment well Patient left: in bed;with call bell/phone within reach;with family/visitor present Nurse Communication: Mobility status         Time: [id number] PT Time Calculation (min): 36 min   Charges:   PT Evaluation $Initial PT Evaluation Tier I: 1 Procedure PT Treatments $Gait Training: 8-22 mins $Therapeutic Exercise: 8-22 mins   PT G Codes:          Darlene Ford 03/05/2014, 5:20 PM

## 2014-03-05 NOTE — Plan of Care (Signed)
Problem: Consults Goal: Diagnosis- Total Joint Replacement Primary Total Knee     

## 2014-03-05 NOTE — Care Management Note (Signed)
    Page 1 of 1   03/05/2014     3:50:16 PM CARE MANAGEMENT NOTE 03/05/2014  Patient:  Darlene Ford,Darlene Ford   Account Number:  [id number]  Date Initiated:  03/05/2014  Documentation initiated by:  Lorenda IshiharaPEELE,Mikaylah Libbey  Subjective/Objective Assessment:   58 yo female admitted s/p knee replacement. PTA lived at home with spouse.     Action/Plan:   Home when stable, awaiting final PT recommendations for equipment needs.   Anticipated DC Date:  03/08/2014   Anticipated DC Plan:  HOME W HOME HEALTH SERVICES      DC Planning Services  CM consult      [facility]   Choice offered to / List presented to:  [name]        HH arranged  HH-2 PT      [facility]   Status of service:  Completed, signed off Medicare Important Message given?   (If response is "NO", the following Medicare IM given date fields will be blank) Date Medicare IM given:   Medicare IM given by:   Date Additional Medicare IM given:   Additional Medicare IM given by:    Discharge Disposition:  HOME W HOME HEALTH SERVICES  Per UR Regulation:  Reviewed for med. necessity/level of care/duration of stay  If discussed at Long Length of Stay Meetings, dates discussed:    Comments:

## 2014-03-05 NOTE — Anesthesia Preprocedure Evaluation (Signed)
Anesthesia Evaluation  Patient identified by MRN, date of birth, ID band Patient awake    Reviewed: Allergy & Precautions, H&P , NPO status , Patient's Chart, lab work & pertinent test results, reviewed documented beta blocker date and time   Airway Mallampati: III TM Distance: <3 FB Neck ROM: Full    Dental no notable dental hx.    Pulmonary neg pulmonary ROS,  breath sounds clear to auscultation  Pulmonary exam normal       Cardiovascular hypertension, Pt. on medications and Pt. on home beta blockers Rhythm:Regular Rate:Normal     Neuro/Psych  Headaches, Seizures -, Well Controlled,  negative psych ROS   GI/Hepatic negative GI ROS, Neg liver ROS,   Endo/Other  Hypothyroidism Morbid obesity  Renal/GU negative Renal ROS     Musculoskeletal  (+) Arthritis -, Osteoarthritis,    Abdominal   Peds  Hematology negative hematology ROS (+)   Anesthesia Other Findings   Reproductive/Obstetrics negative OB ROS                           Anesthesia Physical  Anesthesia Plan  ASA: III  Anesthesia Plan:    Post-op Pain Management:    Induction:   Airway Management Planned:   Additional Equipment:   Intra-op Plan:   Post-operative Plan:   Informed Consent: I have reviewed the patients History and Physical, chart, labs and discussed the procedure including the risks, benefits and alternatives for the proposed anesthesia with the patient or authorized representative who has indicated his/her understanding and acceptance.   Dental advisory given  Plan Discussed with: CRNA  Anesthesia Plan Comments:         Anesthesia Quick Evaluation

## 2014-03-05 NOTE — Anesthesia Procedure Notes (Signed)
Spinal  Patient location during procedure: OR Staffing Anesthesiologist: Letty Salvi R Performed by: anesthesiologist  Preanesthetic Checklist Completed: patient identified, site marked, surgical consent, pre-op evaluation, timeout performed, IV checked, risks and benefits discussed and monitors and equipment checked Spinal Block Patient position: sitting Prep: Betadine Patient monitoring: heart rate, continuous pulse ox and blood pressure Approach: left paramedian Location: L3-4 Injection technique: single-shot Needle Needle type: Sprotte  Needle gauge: 24 G Needle length: 9 cm Assessment Sensory level: T8 Additional Notes Expiration date of kit checked and confirmed. Patient tolerated procedure well, without complications.     

## 2014-03-05 NOTE — Anesthesia Postprocedure Evaluation (Signed)
Anesthesia Post Note  Patient: Darlene Ford  Procedure(s) Performed: Procedure(s) (LRB): RIGHT TOTAL KNEE ARTHROPLASTY (Right)  Anesthesia type: Spinal/general  Patient location: PACU  Post pain: Pain level controlled  Post assessment: Post-op Vital signs reviewed  Last Vitals: BP 129/70  Pulse 71  Temp(Src) 36.7 C (Oral)  Resp 18  Ht 5\' 3"  (1.6 m)  Wt 242 lb (109.77 kg)  BMI 42.88 kg/m2  SpO2 100%  Post vital signs: Reviewed  Level of consciousness: sedated  Complications: No apparent anesthesia complications

## 2014-03-05 NOTE — H&P (Signed)
TOTAL KNEE ADMISSION H&P  Patient is being admitted for right total knee arthroplasty.  Subjective:  Chief Complaint:right knee pain.  HPI: Darlene Ford, 58 y.o. female, has a history of pain and functional disability in the right knee due to arthritis and has failed non-surgical conservative treatments for greater than 12 weeks to includeNSAID's and/or analgesics, corticosteriod injections, flexibility and strengthening excercises, weight reduction as appropriate and activity modification.  Onset of symptoms was gradual, starting 5 years ago with gradually worsening course since that time. The patient noted no past surgery on the right knee(s).  Patient currently rates pain in the right knee(s) at 10 out of 10 with activity. Patient has night pain, worsening of pain with activity and weight bearing, pain that interferes with activities of daily living, pain with passive range of motion, crepitus and joint swelling.  Patient has evidence of subchondral sclerosis, periarticular osteophytes and joint space narrowing by imaging studies. There is no active infection.  Patient Active Problem List   Diagnosis Date Noted  . Arthritis of knee, right 03/05/2014  . Bradycardia 07/20/2013  . Diverticulosis 01/26/2013  . Migraine 01/26/2013  . Hypothyroidism 01/21/2013  . Hypercholesterolemia 01/21/2013  . Seizure disorder 01/21/2013  . Degenerative arthritis of hip 09/19/2012   Past Medical History  Diagnosis Date  . Hyperlipidemia   . Epilepsy     last seizure 4 yrs ago  . Arthritis   . Diverticulitis   . History of transfusion   . Hypothyroidism   . Hx of migraines     stopped with menopause  . Complication of anesthesia     severe tremors "shakes"  . Complication of anesthesia     dropped BP with epidurals with devlivery of daughters to 60/20    Past Surgical History  Procedure Laterality Date  . Colon surgery    . Lumbar laminectomy      age 58  . Tonsillectomy    . Joint  replacement      left total hip  . Bunionectomy  2013  . 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 Colon resection  04/2009    post perf colonoscopy    Prescriptions prior to admission  Medication Sig Dispense Refill  . clonazePAM (KLONOPIN) 0.5 MG tablet Take 0.5 mg by mouth 2 (two) times daily as needed for anxiety.      Marland Kitchen. lacosamide (VIMPAT) 200 MG TABS Take 200 mg by mouth 2 (two) times daily.      Marland Kitchen. levETIRAcetam (KEPPRA) 1000 MG tablet Take 1,500-2,000 mg by mouth See admin instructions. Takes one and a half tablet (1500 mg) in the am and two tablets (2000mg ) at night.      . levothyroxine (SYNTHROID, LEVOTHROID) 137 MCG tablet Take 68.2 mcg by mouth daily before breakfast.      . metoprolol succinate (TOPROL-XL) 50 MG 24 hr tablet Take 25 mg by mouth at bedtime. Take with or immediately following a meal.      . polyethylene glycol (MIRALAX / GLYCOLAX) packet Take 17 g by mouth daily.      . rosuvastatin (CRESTOR) 5 MG tablet Take 5 mg by mouth every morning.      . triamterene-hydrochlorothiazide (DYAZIDE) 37.5-25 MG per capsule Take 1 capsule by mouth daily with breakfast.      . aspirin 325 MG tablet Take 325 mg  by mouth every morning.      . Multiple Vitamin (MULTIVITAMIN WITH MINERALS) TABS Take 1 tablet by mouth every morning.        No Known Allergies  History  Substance Use Topics  . Smoking status: Never Smoker   . Smokeless tobacco: Never Used  . Alcohol Use: No    Family History  Problem Relation Age of Onset  . Lung cancer Mother   . Hyperlipidemia Mother   . Heart disease Father      Review of Systems  Musculoskeletal: Positive for joint pain.  All other systems reviewed and are negative.   Objective:  Physical Exam  Constitutional: She is oriented to person, place, and time. She appears  well-developed and well-nourished.  HENT:  Head: Normocephalic and atraumatic.  Eyes: EOM are normal. Pupils are equal, round, and reactive to light.  Neck: Normal range of motion. Neck supple.  Cardiovascular: Normal rate and regular rhythm.   Respiratory: Effort normal and breath sounds normal.  GI: Soft. Bowel sounds are normal.  Musculoskeletal:       Right knee: She exhibits decreased range of motion, swelling and abnormal alignment. Tenderness found. Medial joint line and lateral joint line tenderness noted.  Neurological: She is alert and oriented to person, place, and time.  Skin: Skin is warm and dry.  Psychiatric: She has a normal mood and affect.    Vital signs in last 24 hours: Temp:  [97.8 F (36.6 C)] 97.8 F (36.6 C) (10/09 0533) Pulse Rate:  [71] 71 (10/09 0533) Resp:  [18] 18 (10/09 0533) BP: (124)/(61) 124/61 mmHg (10/09 0533) SpO2:  [97 %] 97 % (10/09 0533)  Labs:   Estimated body mass index is 41.95 kg/(m^2) as calculated from the following:   Height as of 02/24/14: 5\' 3"  (1.6 m).   Weight as of 09/21/13: 107.389 kg (236 lb 12 oz).   Imaging Review Plain radiographs demonstrate severe degenerative joint disease of the right knee(s). The overall alignment ismild varus. The bone quality appears to be good for age and reported activity level.  Assessment/Plan:  End stage arthritis, right knee   The patient history, physical examination, clinical judgment of the provider and imaging studies are consistent with end stage degenerative joint disease of the right knee(s) and total knee arthroplasty is deemed medically necessary. The treatment options including medical management, injection therapy arthroscopy and arthroplasty were discussed at length. The risks and benefits of total knee arthroplasty were presented and reviewed. The risks due to aseptic loosening, infection, stiffness, patella tracking problems, thromboembolic complications and other imponderables  were discussed. The patient acknowledged the explanation, agreed to proceed with the plan and consent was signed. Patient is being admitted for inpatient treatment for surgery, pain control, PT, OT, prophylactic antibiotics, VTE prophylaxis, progressive ambulation and ADL's and discharge planning. The patient is planning to be discharged home with home health services

## 2014-03-05 NOTE — Brief Op Note (Signed)
03/05/2014  9:35 AM  PATIENT:  Dorthy CoolerPatricia A Mannings  58 y.o. female  PRE-OPERATIVE DIAGNOSIS:  Severe osteoarthritis right knee  POST-OPERATIVE DIAGNOSIS:  Severe osteoarthritis right knee  PROCEDURE:  Procedure(s): RIGHT TOTAL KNEE ARTHROPLASTY (Right)  SURGEON:  Surgeon(s) and Role:    * Kathryne Hitchhristopher Y Zebedee Segundo, MD - Primary  PHYSICIAN ASSISTANT: Rexene EdisonGil Clark, PA-C  ANESTHESIA:   spinal  EBL:  Total I/O In: 1000 [I.V.:1000] Out: 400 [Urine:300; Blood:100]  BLOOD ADMINISTERED:none  DRAINS: none   LOCAL MEDICATIONS USED:  NONE  SPECIMEN:  No Specimen  DISPOSITION OF SPECIMEN:  N/A  COUNTS:  YES  TOURNIQUET:   Total Tourniquet Time Documented: Thigh (Right) - 67 minutes Total: Thigh (Right) - 67 minutes   DICTATION: .Other Dictation: Dictation Number [id number]  PLAN OF CARE: Admit to inpatient   PATIENT DISPOSITION:  PACU - hemodynamically stable.   Delay start of Pharmacological VTE agent (>24hrs) due to surgical blood loss or risk of bleeding: no

## 2014-03-06 LAB — BASIC METABOLIC PANEL
ANION GAP: 12 (ref 5–15)
BUN: 15 mg/dL (ref 6–23)
CHLORIDE: 102 meq/L (ref 96–112)
CO2: 26 mEq/L (ref 19–32)
CREATININE: 0.73 mg/dL (ref 0.50–1.10)
Calcium: 8.8 mg/dL (ref 8.4–10.5)
GFR calc Af Amer: >90 mL/min (ref >90)
GFR calc non Af Amer: >90 mL/min (ref >90)
Glucose, Bld: 126 mg/dL — ABNORMAL HIGH (ref 70–99)
Potassium: 3.4 mEq/L — ABNORMAL LOW (ref 3.7–5.3)
Sodium: 140 mEq/L (ref 137–147)

## 2014-03-06 LAB — CBC
HCT: 29.2 % — ABNORMAL LOW (ref 36.0–46.0)
Hemoglobin: 9.7 g/dL — ABNORMAL LOW (ref 12.0–15.0)
MCH: 30.2 pg (ref 26.0–34.0)
MCHC: 33.2 g/dL (ref 30.0–36.0)
MCV: 91 fL (ref 78.0–100.0)
PLATELETS: 251 10*3/uL (ref 150–400)
RBC: 3.21 MIL/uL — ABNORMAL LOW (ref 3.87–5.11)
RDW: 12.6 % (ref 11.5–15.5)
WBC: 6.8 10*3/uL (ref 4.0–10.5)

## 2014-03-06 NOTE — Progress Notes (Signed)
Subjective: 1 Day Post-Op Procedure(s) (LRB): RIGHT TOTAL KNEE ARTHROPLASTY (Right) Patient reports pain as moderate.    Objective: Vital signs in last 24 hours: Temp:  [97.6 F (36.4 C)-98.9 F (37.2 C)] 98.9 F (37.2 C) (10/10 0600) Pulse Rate:  [57-84] 67 (10/10 0600) Resp:  [12-18] 16 (10/10 0600) BP: (97-142)/(43-80) 97/50 mmHg (10/10 0600) SpO2:  [97 %-100 %] 97 % (10/10 0600) Weight:  [109.77 kg (242 lb)] 109.77 kg (242 lb) (10/09 0900)  Intake/Output from previous day: 10/09 0701 - 10/10 0700 In: 3330 [I.V.:3125; IV Piggyback:205] Out: 2000 [Urine:1900; Blood:100] Intake/Output this shift:     Recent Labs  03/06/14 0503  HGB 9.7*    Recent Labs  03/06/14 0503  WBC 6.8  RBC 3.21*  HCT 29.2*  PLT 251    Recent Labs  03/06/14 0503  NA 140  K 3.4*  CL 102  CO2 26  BUN 15  CREATININE 0.73  GLUCOSE 126*  CALCIUM 8.8   No results found for this basename: LABPT, INR,  in the last 72 hours  Neurologically intact  Hby stable   Assessment/Plan: 1 Day Post-Op Procedure(s) (LRB): RIGHT TOTAL KNEE ARTHROPLASTY (Right) Up with therapy who is in the room now.  SL iv  Krisandra Bueno C 03/06/2014, 8:21 AM

## 2014-03-06 NOTE — Progress Notes (Signed)
Physical Therapy Treatment Patient Details Name: Darlene Ford MRN: [id number] DOB: 07/18/1955 Today's Date: 03/06/2014    History of Present Illness R TKR    PT Comments    Progressing well and hoping for discharge tomorrow  Follow Up Recommendations  Home health PT     Equipment Recommendations  None recommended by PT    Recommendations for Other Services OT consult     Precautions / Restrictions Precautions Precautions: Knee;Fall Required Braces or Orthoses: Knee Immobilizer - Right Knee Immobilizer - Right: Discontinue once straight leg raise with < 10 degree lag Restrictions Weight Bearing Restrictions: No Other Position/Activity Restrictions: WBAT    Mobility  Bed Mobility Overal bed mobility: Needs Assistance Bed Mobility: Supine to Sit     Supine to sit: Min assist     General bed mobility comments: cues for sequence and use of L LE to self assist  Transfers Overall transfer level: Needs assistance Equipment used: Rolling walker (2 wheeled) Transfers: Sit to/from Stand Sit to Stand: Min assist         General transfer comment: cues for LE management and use of UEs to self assist  Ambulation/Gait Ambulation/Gait assistance: Min assist Ambulation Distance (Feet): 123 Feet Assistive device: Rolling walker (2 wheeled) Gait Pattern/deviations: Step-to pattern;Step-through pattern Gait velocity: decr   General Gait Details: cues for sequence, posture, position from Rohm and HaasW   Stairs            Wheelchair Mobility    Modified Rankin (Stroke Patients Only)       Balance                                    Cognition Arousal/Alertness: Awake/alert Behavior During Therapy: WFL for tasks assessed/performed Overall Cognitive Status: Within Functional Limits for tasks assessed                      Exercises Total Joint Exercises Ankle Circles/Pumps: AROM;15 reps;Supine;Both Quad Sets: AROM;Both;10  reps;Supine Heel Slides: AAROM;Right;Supine;15 reps Straight Leg Raises: AAROM;Right;Supine;15 reps Goniometric ROM: AAROM R knee -10 - 45    General Comments        Pertinent Vitals/Pain Pain Assessment: 0-10 Pain Score: 4  Pain Location: R knee Pain Descriptors / Indicators: Aching Pain Intervention(s): Limited activity within patient's tolerance;Monitored during session;Premedicated before session;Ice applied    Home Living                      Prior Function            PT Goals (current goals can now be found in the care plan section) Acute Rehab PT Goals Patient Stated Goal: Resume previous lifestyle with decreased pain PT Goal Formulation: With patient Time For Goal Achievement: 03/12/14 Potential to Achieve Goals: Good Progress towards PT goals: Progressing toward goals    Frequency  7X/week    PT Plan Current plan remains appropriate    Co-evaluation             End of Session Equipment Utilized During Treatment: Gait belt;Right knee immobilizer Activity Tolerance: Patient tolerated treatment well;Other (comment) (lightheaded BP 108/54) Patient left: in chair;with call bell/phone within reach;with family/visitor present     Time: [id number] PT Time Calculation (min): 35 min  Charges:  $Gait Training: 8-22 mins $Therapeutic Exercise: 8-22 mins  G Codes:      Darlene Ford 03/06/2014, 9:50 AM

## 2014-03-06 NOTE — Evaluation (Signed)
Occupational Therapy Evaluation Patient Details Name: Darlene Ford MRN: [id number] DOB: 05/03/1956 Today's Date: 03/06/2014    History of Present Illness R TKR   Clinical Impression   Pt is familiar with ADL tasks from her previous hip surgeries and has all AE and DME for OT. Mother and spouse will be assisting at d/c. All education completed and feel she will progress with PT for transfers.     Follow Up Recommendations  No OT follow up;Supervision/Assistance - 24 hour    Equipment Recommendations  None recommended by OT    Recommendations for Other Services       Precautions / Restrictions Precautions Precautions: Knee;Fall Required Braces or Orthoses: Knee Immobilizer - Right Knee Immobilizer - Right: Discontinue once straight leg raise with < 10 degree lag Restrictions Weight Bearing Restrictions: No Other Position/Activity Restrictions: WBAT      Mobility Bed Mobility           Sit to supine: Min assist   General bed mobility comments: assist for R LE onto bed.   Transfers Overall transfer level: Needs assistance Equipment used: Rolling walker (2 wheeled) Transfers: Sit to/from Stand Sit to Stand: Min assist         General transfer comment: cues for LE management and hand placement.     Balance                                            ADL Overall ADL's : Needs assistance/impaired Eating/Feeding: Independent;Sitting   Grooming: Set up;Sitting   Upper Body Bathing: Set up;Sitting   Lower Body Bathing: Moderate assistance;Sit to/from stand   Upper Body Dressing : Set up;Sitting   Lower Body Dressing: Moderate assistance;Sit to/from stand   Toilet Transfer: Minimal assistance;Ambulation;BSC;RW   Toileting- Clothing Manipulation and Hygiene: Minimal assistance;Sit to/from stand         General ADL Comments: Attempted to void after transfer into bathroomt o 3in1. Pt not able. Pt states mother will be assisting at  d/c in addition to her spouse. Her mother was present for session. Pt verbalizes how to use AE for LB dressing as she has had previous hip surgeries. She owns all the AE pieces. She plans to sponge bathe initially as she has a tub downstairs and no seat. Educated on tubbench option but pt states she will sponge till able to get into tub or upstairs shower. Educated on need to wear KI until able to SLR and how to properly position. Discussed use of 3in1 as a seat to sit on at sink to bathe. Practiced 3in1 transfer and pt needed min cues for proper hand placement. She is doing well and feel she will progress with functional transfer with PT. She states she does have a tight bathroom doorway and will need to side step through door but then able to turn and walk with her walker forward to 3in1. She declines need to practice side step with OT.      Vision                     Perception     Praxis      Pertinent Vitals/Pain Pain Assessment: 0-10 Pain Score: 4  Pain Location: R knee Pain Descriptors / Indicators: Aching Pain Intervention(s): Repositioned;Ice applied     Hand Dominance Right   Extremity/Trunk Assessment Upper Extremity Assessment Upper Extremity  Assessment: Overall WFL for tasks assessed           Communication Communication Communication: No difficulties   Cognition Arousal/Alertness: Awake/alert Behavior During Therapy: WFL for tasks assessed/performed Overall Cognitive Status: Within Functional Limits for tasks assessed                     General Comments       Exercises       Shoulder Instructions      Home Living Family/patient expects to be discharged to:: Private residence Living Arrangements: Spouse/significant other Available Help at Discharge: Family Type of Home: House Home Access: Stairs to enter Entergy CorporationEntrance Stairs-Number of Steps: 1 Entrance Stairs-Rails: None Home Layout: Two level;Able to live on main level with  bedroom/bathroom Alternate Level Stairs-Number of Steps: 14 Alternate Level Stairs-Rails: Right Bathroom Shower/Tub: Tub/shower unit   Bathroom Toilet: Handicapped height     Home Equipment: Environmental consultantWalker - 2 wheels;Bedside commode;Shower seat - built Designer, fashion/clothingin;Adaptive equipment Adaptive Equipment: Reacher;Sock aid;Long-handled shoe horn;Long-handled sponge        Prior Functioning/Environment Level of Independence: Independent             OT Diagnosis: Generalized weakness   OT Problem List:     OT Treatment/Interventions:      OT Goals(Current goals can be found in the care plan section) Acute Rehab OT Goals Patient Stated Goal: return to independence OT Goal Formulation: With patient/family  OT Frequency:     Barriers to D/C:            Co-evaluation              End of Session Equipment Utilized During Treatment: Gait belt;Rolling walker;Right knee immobilizer  Activity Tolerance: Patient tolerated treatment well Patient left: in bed;with call bell/phone within reach   Time: 1117-1143 OT Time Calculation (min): 26 min Charges:  OT General Charges $OT Visit: 1 Procedure OT Evaluation $Initial OT Evaluation Tier I: 1 Procedure OT Treatments $Therapeutic Activity: 8-22 mins G-Codes:    [name], [name] [phone] 03/06/2014, 12:28 PM

## 2014-03-06 NOTE — Progress Notes (Signed)
Physical Therapy Treatment Patient Details Name: Darlene Ford MRN: [id number] DOB: 02/08/1956 Today's Date: 03/06/2014    History of Present Illness R TKR    PT Comments      Follow Up Recommendations  Home health PT     Equipment Recommendations  None recommended by PT    Recommendations for Other Services OT consult     Precautions / Restrictions Precautions Precautions: Knee;Fall Required Braces or Orthoses: Knee Immobilizer - Right Knee Immobilizer - Right: Discontinue once straight leg raise with < 10 degree lag Restrictions Weight Bearing Restrictions: No Other Position/Activity Restrictions: WBAT    Mobility  Bed Mobility Overal bed mobility: Needs Assistance Bed Mobility: Sit to Supine       Sit to supine: Min assist   General bed mobility comments: assist for R LE onto bed.   Transfers Overall transfer level: Needs assistance Equipment used: Rolling walker (2 wheeled) Transfers: Sit to/from Stand Sit to Stand: Min assist         General transfer comment: cues for LE management and hand placement.   Ambulation/Gait Ambulation/Gait assistance: Min assist Ambulation Distance (Feet): 147 Feet Assistive device: Rolling walker (2 wheeled) Gait Pattern/deviations: Step-to pattern;Step-through pattern;Shuffle;Trunk flexed Gait velocity: cues to decrease pace for saftey   General Gait Details: cues for sequence, posture, position from Rohm [employer]asW   Stairs            Wheelchair Mobility    Modified Rankin (Stroke Patients Only)       Balance                                    Cognition Arousal/Alertness: Awake/alert Behavior During Therapy: WFL for tasks assessed/performed Overall Cognitive Status: Within Functional Limits for tasks assessed                      Exercises      General Comments        Pertinent Vitals/Pain Pain Assessment: 0-10 Pain Score: 5  Pain Location: R knee Pain Descriptors /  Indicators: Aching;Sore Pain Intervention(s): Limited activity within patient's tolerance;Monitored during session;Premedicated before session;Ice applied    Home Living Family/patient expects to be discharged to:: Private residence Living Arrangements: Spouse/significant other Available Help at Discharge: Family Type of Home: House Home Access: Stairs to enter Entrance Stairs-Rails: None Home Layout: Two level;Able to live on main level with bedroom/bathroom Home Equipment: Dan HumphreysWalker - 2 wheels;Bedside commode;Shower seat - built in;Adaptive equipment      Prior Function Level of Independence: Independent          PT Goals (current goals can now be found in the care plan section) Acute Rehab PT Goals Patient Stated Goal: return to independence PT Goal Formulation: With patient Time For Goal Achievement: 03/12/14 Potential to Achieve Goals: Good Progress towards PT goals: Progressing toward goals    Frequency  7X/week    PT Plan Current plan remains appropriate    Co-evaluation             End of Session Equipment Utilized During Treatment: Gait belt;Right knee immobilizer Activity Tolerance: Patient tolerated treatment well;Other (comment) Patient left: in bed;with call bell/phone within reach;with family/visitor present     Time: [id number] PT Time Calculation (min): 22 min  Charges:  $Gait Training: 8-22 mins                    G  Codes:      Darlene Ford 03/06/2014, 4:21 PM

## 2014-03-07 LAB — CBC
HCT: 29.7 % — ABNORMAL LOW (ref 36.0–46.0)
HEMOGLOBIN: 10 g/dL — AB (ref 12.0–15.0)
MCH: 30.5 pg (ref 26.0–34.0)
MCHC: 33.7 g/dL (ref 30.0–36.0)
MCV: 90.5 fL (ref 78.0–100.0)
PLATELETS: 265 10*3/uL (ref 150–400)
RBC: 3.28 MIL/uL — AB (ref 3.87–5.11)
RDW: 12.7 % (ref 11.5–15.5)
WBC: 9.8 10*3/uL (ref 4.0–10.5)

## 2014-03-07 MED ORDER — OXYCODONE-ACETAMINOPHEN 5-325 MG PO TABS: 1.0000 | ORAL_TABLET | ORAL | Status: DC | PRN

## 2014-03-07 MED ORDER — METHOCARBAMOL 500 MG PO TABS: 500.0000 mg | ORAL_TABLET | Freq: Four times a day (QID) | ORAL | Status: DC | PRN

## 2014-03-07 MED ORDER — RIVAROXABAN 10 MG PO TABS
10.0000 mg | ORAL_TABLET | Freq: Every day | ORAL | Status: DC
Start: 2014-03-07 — End: 2014-07-23

## 2014-03-07 MED ORDER — LEVOTHYROXINE SODIUM 137 MCG PO TABS
68.5000 ug | ORAL_TABLET | Freq: Every day | ORAL | Status: DC
  Administered 2014-03-07: 68.5 ug via ORAL
  Filled 2014-03-07 (×2): qty 0.5

## 2014-03-07 NOTE — Discharge Instructions (Signed)
Information on my medicine - XARELTO (Rivaroxaban)  This medication education was reviewed with me or my healthcare representative as part of my discharge preparation.  The pharmacist that spoke with me during my hospital stay was:  Wynona Caneshristine PharmD  Why was Xarelto prescribed for you? Xarelto was prescribed for you to reduce the risk of blood clots forming after orthopedic surgery. The medical term for these abnormal blood clots is venous thromboembolism (VTE).  What do you need to know about xarelto ? Take your Xarelto ONCE DAILY at the same time every day. You may take it either with or without food.  If you have difficulty swallowing the tablet whole, you may crush it and mix in applesauce just prior to taking your dose.  Take Xarelto exactly as prescribed by your doctor and DO NOT stop taking Xarelto without talking to the doctor who prescribed the medication.  Stopping without other VTE prevention medication to take the place of Xarelto may increase your risk of developing a clot.  After discharge, you should have regular check-up appointments with your healthcare provider that is prescribing your Xarelto.    What do you do if you miss a dose? If you miss a dose, take it as soon as you remember on the same day then continue your regularly scheduled once daily regimen the next day. Do not take two doses of Xarelto on the same day.   Important Safety Information A possible side effect of Xarelto is bleeding. You should call your healthcare provider right away if you experience any of the following:   Bleeding from an injury or your nose that does not stop.   Unusual colored urine (red or dark brown) or unusual colored stools (red or black).   Unusual bruising for unknown reasons.   A serious fall or if you hit your head (even if there is no bleeding).  Some medicines may interact with Xarelto and might increase your risk of bleeding while on Xarelto. To help avoid this,  consult your healthcare provider or pharmacist prior to using any new prescription or non-prescription medications, including herbals, vitamins, non-steroidal anti-inflammatory drugs (NSAIDs) and supplements.  This website has more information on Xarelto: [email or web address].

## 2014-03-07 NOTE — Progress Notes (Signed)
Pt refused HS Oxycontin stating she had felt pain free most of the day without additional meds and had felt "a little off" Will hold a make am staff aware

## 2014-03-07 NOTE — Progress Notes (Signed)
CARE MANAGEMENT NOTE 03/07/2014  Patient:  Darlene Ford,Darlene Ford   Account Number:  [id number]  Date Initiated:  03/05/2014  Documentation initiated by:  Lorenda IshiharaPEELE,SUZANNE  Subjective/Objective Assessment:   58 yo female admitted s/p knee replacement. PTA lived at home with spouse.     Action/Plan:   Home when stable, awaiting final PT recommendations for equipment needs.   Anticipated DC Date:  03/08/2014   Anticipated DC Plan:  HOME W HOME HEALTH SERVICES      DC Planning Services  CM consult      [facility]   Choice offered to / List presented to:  [name]        HH arranged  HH-2 PT      [facility]   Status of service:  Completed, signed off Medicare Important Message given?   (If response is "NO", the following Medicare IM given date fields will be blank) Date Medicare IM given:   Medicare IM given by:   Date Additional Medicare IM given:   Additional Medicare IM given by:    Discharge Disposition:  HOME W HOME HEALTH SERVICES  Per UR Regulation:  Reviewed for med. necessity/level of care/duration of stay  If discussed at Long Length of Stay Meetings, dates discussed:    Comments:  03/07/2014 0915 NCM spoke to pt and [facility]. Pt states she has RW and 3n1. Isidoro DonningAlesia Sahithi Ordoyne RN CCM Case Mgmt phone [phone]

## 2014-03-07 NOTE — Progress Notes (Addendum)
Pt reports being uncomfortable after attempting to empty bladder when up to bathroom to void. Voided 350 cc but "feels as if she needs to empty bladder more" Bladder Scan revealed ">810" I/O cath for 950 cc tolerated well. Will monitor closely throughout the night.

## 2014-03-07 NOTE — Progress Notes (Signed)
Physical Therapy Treatment Patient Details Name: Darlene Ford MRN: [id number] DOB: 12/22/1955 Today's Date: 03/07/2014    History of Present Illness R TKR    PT Comments    Pt motivated but ltd by dizziness with ambulation.  Reviewed stairs and don/doff KI this am.  Follow Up Recommendations  Home health PT     Equipment Recommendations  None recommended by PT    Recommendations for Other Services OT consult     Precautions / Restrictions Precautions Precautions: Knee;Fall Required Braces or Orthoses: Knee Immobilizer - Right Knee Immobilizer - Right: Discontinue once straight leg raise with < 10 degree lag Restrictions Weight Bearing Restrictions: No Other Position/Activity Restrictions: WBAT    Mobility  Bed Mobility Overal bed mobility: Needs Assistance Bed Mobility: Supine to Sit;Sit to Supine     Supine to sit: Min assist Sit to supine: Min assist   General bed mobility comments: assist for R LE onto bed.   Transfers Overall transfer level: Needs assistance Equipment used: Rolling walker (2 wheeled) Transfers: Sit to/from Stand Sit to Stand: Min guard         General transfer comment: cues for LE management and hand placement.   Ambulation/Gait Ambulation/Gait assistance: Min guard Ambulation Distance (Feet): 35 Feet Assistive device: Rolling walker (2 wheeled) Gait Pattern/deviations: Step-to pattern;Decreased step length - right;Decreased step length - left;Shuffle;Trunk flexed Gait velocity: cues to decrease pace for saftey   General Gait Details: cues for sequence, posture, position from RW   Stairs Stairs: Yes Stairs assistance: Min assist Stair Management: No rails;Step to pattern;Forwards;With walker Number of Stairs: 1 (twice) General stair comments: cues for sequence and foot/RW placement  Wheelchair Mobility    Modified Rankin (Stroke Patients Only)       Balance                                     Cognition Arousal/Alertness: Awake/alert Behavior During Therapy: WFL for tasks assessed/performed Overall Cognitive Status: Within Functional Limits for tasks assessed                      Exercises Total Joint Exercises Ankle Circles/Pumps: AROM;15 reps;Supine;Both Quad Sets: AROM;Both;Supine;20 reps Heel Slides: AAROM;Right;Supine;20 reps Straight Leg Raises: AAROM;Right;Supine;20 reps Goniometric ROM: AAROM R knee -10 - 65    General Comments        Pertinent Vitals/Pain Pain Assessment: 0-10 Pain Score: 4  Pain Location: R knee Pain Descriptors / Indicators: Sore Pain Intervention(s): Limited activity within patient's tolerance;Monitored during session;Premedicated before session;Ice applied    Home Living                      Prior Function            PT Goals (current goals can now be found in the care plan section) Acute Rehab PT Goals Patient Stated Goal: return to independence PT Goal Formulation: With patient Time For Goal Achievement: 03/12/14 Potential to Achieve Goals: Good Progress towards PT goals: Progressing toward goals    Frequency  7X/week    PT Plan Current plan remains appropriate    Co-evaluation             End of Session Equipment Utilized During Treatment: Gait belt;Right knee immobilizer Activity Tolerance: Patient tolerated treatment well;Other (comment) (nausea with ambulation) Patient left: in bed;with call bell/phone within reach     Time: [phone]  PT Time Calculation (min): 40 min  Charges:  $Gait Training: 8-22 mins $Therapeutic Exercise: 8-22 mins $Therapeutic Activity: 8-22 mins                    G Codes:      Lizette Pazos 03/07/2014, 1:15 PM

## 2014-03-07 NOTE — Progress Notes (Signed)
Subjective: 2 Days Post-Op Procedure(s) (LRB): RIGHT TOTAL KNEE ARTHROPLASTY (Right) Patient reports pain as moderate.  Doing well with therapy and her mobility.  Wants to go home today after PT.  Objective: Vital signs in last 24 hours: Temp:  [97.6 F (36.4 C)-99.5 F (37.5 C)] 98.9 F (37.2 C) (10/11 0618) Pulse Rate:  [84-94] 84 (10/11 0618) Resp:  [18-20] 18 (10/11 0618) BP: (122-134)/(49-74) 134/74 mmHg (10/11 0618) SpO2:  [90 %-96 %] 94 % (10/11 0618)  Intake/Output from previous day: 10/10 0701 - 10/11 0700 In: 1790 [P.O.:1440; I.V.:350] Out: 2450 [Urine:2450] Intake/Output this shift:     Recent Labs  03/06/14 0503 03/07/14 0612  HGB 9.7* 10.0*    Recent Labs  03/06/14 0503 03/07/14 0612  WBC 6.8 9.8  RBC 3.21* 3.28*  HCT 29.2* 29.7*  PLT 251 265    Recent Labs  03/06/14 0503  NA 140  K 3.4*  CL 102  CO2 26  BUN 15  CREATININE 0.73  GLUCOSE 126*  CALCIUM 8.8   No results found for this basename: LABPT, INR,  in the last 72 hours  Sensation intact distally Intact pulses distally Dorsiflexion/Plantar flexion intact Incision: scant drainage No cellulitis present Compartment soft  Assessment/Plan: 2 Days Post-Op Procedure(s) (LRB): RIGHT TOTAL KNEE ARTHROPLASTY (Right) Up with therapy Discharge home with home health today.  Talbot Monarch Y 03/07/2014, 8:28 AM

## 2014-03-07 NOTE — Discharge Summary (Signed)
Patient ID: Darlene Ford MRN: [id number] DOB/AGE: August 15, 1955 58 y.o.  Admit date: 03/05/2014 Discharge date: 03/07/2014  Admission Diagnoses:  Principal Problem:   Arthritis of knee, right Active Problems:   Status post total right knee replacement   Discharge Diagnoses:  Same  Past Medical History  Diagnosis Date  . Hyperlipidemia   . Epilepsy     last seizure 4 yrs ago  . Arthritis   . Diverticulitis   . History of transfusion   . Hypothyroidism   . Hx of migraines     stopped with menopause  . Complication of anesthesia     severe tremors "shakes"  . Complication of anesthesia     dropped BP with epidurals with devlivery of daughters to 60/20    Surgeries: Procedure(s): RIGHT TOTAL KNEE ARTHROPLASTY on 03/05/2014   Consultants:    Discharged Condition: Improved  Hospital Course: Darlene Ford is an 58 y.o. female who was admitted 03/05/2014 for operative treatment ofArthritis of knee, right. Patient has severe unremitting pain that affects sleep, daily activities, and work/hobbies. After pre-op clearance the patient was taken to the operating room on 03/05/2014 and underwent  Procedure(s): RIGHT TOTAL KNEE ARTHROPLASTY.    Patient was given perioperative antibiotics: Anti-infectives   Start     Dose/Rate Route Frequency Ordered Stop   03/05/14 1400  ceFAZolin (ANCEF) IVPB 1 g/50 mL premix     1 g 100 mL/hr over 30 Minutes Intravenous Every 6 hours 03/05/14 1155 03/05/14 2154   03/05/14 0600  ceFAZolin (ANCEF) IVPB 2 g/50 mL premix     2 g 100 mL/hr over 30 Minutes Intravenous On call to O.R. 03/05/14 0535 03/05/14 0747       Patient was given sequential compression devices, early ambulation, and chemoprophylaxis to prevent DVT.  Patient benefited maximally from hospital stay and there were no complications.    Recent vital signs: Patient Vitals for the past 24 hrs:  BP Temp Temp src Pulse Resp SpO2  03/07/14 0618 134/74 mmHg 98.9 F (37.2 C)  Oral 84 18 94 %  03/06/14 2158 133/63 mmHg 99.5 F (37.5 C) Oral 86 20 90 %  03/06/14 1418 122/49 mmHg 97.6 F (36.4 C) Oral 94 18 96 %     Recent laboratory studies:  Recent Labs  03/06/14 0503 03/07/14 0612  WBC 6.8 9.8  HGB 9.7* 10.0*  HCT 29.2* 29.7*  PLT 251 265  NA 140  --   K 3.4*  --   CL 102  --   CO2 26  --   BUN 15  --   CREATININE 0.73  --   GLUCOSE 126*  --   CALCIUM 8.8  --      Discharge Medications:     Medication List    STOP taking these medications       aspirin 325 MG tablet      TAKE these medications       clonazePAM 0.5 MG tablet  Commonly known as:  KLONOPIN  Take 0.5 mg by mouth 2 (two) times daily as needed for anxiety.     lacosamide 200 MG Tabs tablet  Commonly known as:  VIMPAT  Take 200 mg by mouth 2 (two) times daily.     levETIRAcetam 1000 MG tablet  Commonly known as:  KEPPRA  Take 1,500-2,000 mg by mouth See admin instructions. Takes one and a half tablet (1500 mg) in the am and two tablets (2000mg ) at night.     levothyroxine  137 MCG tablet  Commonly known as:  SYNTHROID, LEVOTHROID  Take 68.2 mcg by mouth daily before breakfast.     methocarbamol 500 MG tablet  Commonly known as:  ROBAXIN  Take 1 tablet (500 mg total) by mouth every 6 (six) hours as needed for muscle spasms.     metoprolol succinate 50 MG 24 hr tablet  Commonly known as:  TOPROL-XL  Take 25 mg by mouth at bedtime. Take with or immediately following a meal.     multivitamin with minerals Tabs tablet  Take 1 tablet by mouth every morning.     oxyCODONE-acetaminophen 5-325 MG per tablet  Commonly known as:  ROXICET  Take 1-2 tablets by mouth every 4 (four) hours as needed.     polyethylene glycol packet  Commonly known as:  MIRALAX / GLYCOLAX  Take 17 g by mouth daily.     rivaroxaban 10 MG Tabs tablet  Commonly known as:  XARELTO  Take 1 tablet (10 mg total) by mouth daily with breakfast.     rosuvastatin 5 MG tablet  Commonly known as:   CRESTOR  Take 5 mg by mouth every morning.     triamterene-hydrochlorothiazide 37.5-25 MG per capsule  Commonly known as:  DYAZIDE  Take 1 capsule by mouth daily with breakfast.        Diagnostic Studies: Dg Chest 2 View  02/24/2014   CLINICAL DATA:  Preop.  Hypertension.  EXAM: CHEST  2 VIEW  COMPARISON:  None.  FINDINGS: Midline trachea. Normal heart size, accentuated by low lung volumes on the frontal radiograph. Mediastinal contours otherwise within normal limits. No pleural effusion or pneumothorax. Clear lungs.  IMPRESSION: No acute cardiopulmonary disease.   Electronically Signed   By: Jeronimo GreavesKyle  Talbot M.D.   On: 02/24/2014 10:18   Dg Knee Right Port  03/05/2014   CLINICAL DATA:  Initial encounter for right knee replacement today  EXAM: PORTABLE RIGHT KNEE - 1-2 VIEW  COMPARISON:  None.  FINDINGS: Two view portable study at 1011 hrs shows the patient to be status post tricompartmental knee replacement. No evidence for immediate hardware complications. Gas in the soft tissues is compatible with immediate postoperative state.  IMPRESSION: Status post tricompartmental knee replacement without evidence for complicating features.   Electronically Signed   By: Kennith CenterEric  Mansell M.D.   On: 03/05/2014 10:42    Disposition: 06-Home-Health Care Svc      Discharge Instructions   Call MD / Call 911    Complete by:  As directed   If you experience chest pain or shortness of breath, CALL 911 and be transported to the hospital emergency room.  If you develope a fever above 101 F, pus (white drainage) or increased drainage or redness at the wound, or calf pain, call your surgeon's office.     Constipation Prevention    Complete by:  As directed   Drink plenty of fluids.  Prune juice may be helpful.  You may use a stool softener, such as Colace (over the counter) 100 mg twice a day.  Use MiraLax (over the counter) for constipation as needed.     Diet - low sodium heart healthy    Complete by:  As directed       Discharge instructions    Complete by:  As directed   Increase your activities as comfort allows. Work on knee motion/bending. Ice and elevation for swelling. You can get your current dressing wet daily in the shower. You can leave your  dressing on until your outpatient follow-up.     Discharge patient    Complete by:  As directed      Increase activity slowly as tolerated    Complete by:  As directed            Follow-up Information   Follow up with [facility]. (Physical Therapy)    Contact information:   [address] [place] [address] [phone]       Follow up with Kathryne HitchBLACKMAN,Casimir Barcellos Y, MD In 2 weeks.   Specialty:  Orthopedic Surgery   Contact information:   [address] [place] [address] [phone]        Signed: Kathryne HitchBLACKMAN,Sharlie Shreffler Y 03/07/2014, 8:31 AM

## 2014-03-09 ENCOUNTER — Encounter (HOSPITAL_COMMUNITY): Payer: Self-pay | Admitting: Orthopaedic Surgery

## 2014-03-17 ENCOUNTER — Encounter: Payer: Self-pay | Admitting: Internal Medicine

## 2014-03-17 ENCOUNTER — Ambulatory Visit (INDEPENDENT_AMBULATORY_CARE_PROVIDER_SITE_OTHER): Payer: Managed Care, Other (non HMO) | Admitting: Internal Medicine

## 2014-03-17 VITALS — BP 142/90 | HR 70 | Temp 97.7°F | Ht 63.25 in | Wt 239.0 lb

## 2014-03-17 DIAGNOSIS — M129 Arthropathy, unspecified: Secondary | ICD-10-CM

## 2014-03-17 DIAGNOSIS — E7800 Pure hypercholesterolemia, unspecified: Secondary | ICD-10-CM

## 2014-03-17 DIAGNOSIS — E039 Hypothyroidism, unspecified: Secondary | ICD-10-CM | POA: Diagnosis not present

## 2014-03-17 DIAGNOSIS — G40909 Epilepsy, unspecified, not intractable, without status epilepticus: Secondary | ICD-10-CM

## 2014-03-17 DIAGNOSIS — R001 Bradycardia, unspecified: Secondary | ICD-10-CM

## 2014-03-17 DIAGNOSIS — M1711 Unilateral primary osteoarthritis, right knee: Secondary | ICD-10-CM

## 2014-03-17 DIAGNOSIS — G43809 Other migraine, not intractable, without status migrainosus: Secondary | ICD-10-CM

## 2014-03-17 DIAGNOSIS — Z23 Encounter for immunization: Secondary | ICD-10-CM

## 2014-03-17 DIAGNOSIS — Z01419 Encounter for gynecological examination (general) (routine) without abnormal findings: Secondary | ICD-10-CM

## 2014-03-17 DIAGNOSIS — K5730 Diverticulosis of large intestine without perforation or abscess without bleeding: Secondary | ICD-10-CM

## 2014-03-17 LAB — CBC WITH DIFFERENTIAL/PLATELET
BASOS PCT: 0.5 % (ref 0.0–3.0)
Basophils Absolute: 0 10*3/uL (ref 0.0–0.1)
EOS PCT: 2.9 % (ref 0.0–5.0)
Eosinophils Absolute: 0.2 10*3/uL (ref 0.0–0.7)
HCT: 35.8 % — ABNORMAL LOW (ref 36.0–46.0)
Hemoglobin: 11.6 g/dL — ABNORMAL LOW (ref 12.0–15.0)
Lymphocytes Relative: 23.1 % (ref 12.0–46.0)
Lymphs Abs: 1.3 10*3/uL (ref 0.7–4.0)
MCHC: 32.5 g/dL (ref 30.0–36.0)
MCV: 93.2 fl (ref 78.0–100.0)
MONO ABS: 0.5 10*3/uL (ref 0.1–1.0)
MONOS PCT: 8.3 % (ref 3.0–12.0)
NEUTROS PCT: 65.2 % (ref 43.0–77.0)
Neutro Abs: 3.6 10*3/uL (ref 1.4–7.7)
Platelets: 466 10*3/uL — ABNORMAL HIGH (ref 150.0–400.0)
RBC: 3.84 Mil/uL — AB (ref 3.87–5.11)
RDW: 13.9 % (ref 11.5–15.5)
WBC: 5.5 10*3/uL (ref 4.0–10.5)

## 2014-03-17 LAB — FERRITIN: Ferritin: 178.7 ng/mL (ref 10.0–291.0)

## 2014-03-17 LAB — TSH: TSH: 6.07 u[IU]/mL — ABNORMAL HIGH (ref 0.35–4.50)

## 2014-03-17 NOTE — Progress Notes (Signed)
Pre visit review using our clinic review tool, if applicable. No additional management support is needed unless otherwise documented below in the visit note. 

## 2014-03-18 ENCOUNTER — Other Ambulatory Visit: Payer: Self-pay | Admitting: Internal Medicine

## 2014-03-18 DIAGNOSIS — E039 Hypothyroidism, unspecified: Secondary | ICD-10-CM

## 2014-03-18 LAB — LIPID PANEL
CHOL/HDL RATIO: 5
Cholesterol: 182 mg/dL (ref 0–200)
HDL: 38 mg/dL — ABNORMAL LOW (ref >39.00)
NONHDL: 144
TRIGLYCERIDES: 255 mg/dL — AB (ref 0.0–149.0)
VLDL: 51 mg/dL — ABNORMAL HIGH (ref 0.0–40.0)

## 2014-03-18 LAB — IBC PANEL
Iron: 65 ug/dL (ref 42–145)
SATURATION RATIOS: 17.9 % — AB (ref 20.0–50.0)
Transferrin: 259.9 mg/dL (ref 212.0–360.0)

## 2014-03-18 LAB — HEPATIC FUNCTION PANEL
ALT: 28 U/L (ref 0–35)
AST: 19 U/L (ref 0–37)
Albumin: 3.6 g/dL (ref 3.5–5.2)
Alkaline Phosphatase: 47 U/L (ref 39–117)
BILIRUBIN DIRECT: 0.1 mg/dL (ref 0.0–0.3)
BILIRUBIN TOTAL: 0.6 mg/dL (ref 0.2–1.2)
Total Protein: 7.3 g/dL (ref 6.0–8.3)

## 2014-03-18 LAB — BASIC METABOLIC PANEL
BUN: 14 mg/dL (ref 6–23)
CALCIUM: 9.7 mg/dL (ref 8.4–10.5)
CHLORIDE: 100 meq/L (ref 96–112)
CO2: 26 mEq/L (ref 19–32)
CREATININE: 0.8 mg/dL (ref 0.4–1.2)
GFR: 75.72 mL/min (ref >60.00)
Glucose, Bld: 101 mg/dL — ABNORMAL HIGH (ref 70–99)
Potassium: 4.4 mEq/L (ref 3.5–5.1)
SODIUM: 137 meq/L (ref 135–145)

## 2014-03-18 LAB — LDL CHOLESTEROL, DIRECT: LDL DIRECT: 104.6 mg/dL

## 2014-03-18 NOTE — Progress Notes (Signed)
Order placed for f/u tsh.  

## 2014-03-19 ENCOUNTER — Encounter: Payer: Self-pay | Admitting: *Deleted

## 2014-03-19 ENCOUNTER — Other Ambulatory Visit: Payer: Self-pay | Admitting: *Deleted

## 2014-03-19 MED ORDER — LEVOTHYROXINE SODIUM 150 MCG PO TABS: 150.0000 ug | ORAL_TABLET | Freq: Every day | ORAL | Status: DC

## 2014-03-21 ENCOUNTER — Encounter: Payer: Self-pay | Admitting: Internal Medicine

## 2014-03-21 NOTE — Assessment & Plan Note (Addendum)
Intermittent diverticular flares.  Previous colon perforation during last colonoscopy.  S/p partial colon resection.  Taking miralax to keep bowels stable.

## 2014-03-21 NOTE — Progress Notes (Signed)
Subjective:    Patient ID: Darlene CoolerPatricia A Kreis, female    DOB: 04/01/1956, 58 y.o.   MRN: [id number]  HPI 58 year old female with past history of arthritis, diverticulosis and diverticulitis, hypercholesterolemia, hypothyroidism and seizure disorder.  She comes in today for a scheduled follow up.   Previously seeing Dr Kenna GilbertArgarwal [facility]).  Also is followed by Dr Sherlean FootSinha [facility].  Moved here in 2005.  Previously worked as a Retail bankerdialysis nurse.  She has known diverticulosis and suffered a perforation after a colonoscopy in 2010.  Had a partial colon resection with 8-10 inches of her colon removed.  Occasionally will have diverticular flares.  Previously had C. Diff infection.  Is s/p right knee surgery.  Home health started physical therapy yesterday.  Eating and drinking well.  No nausea or vomiting.  Taking miralax to help keep her bowels move.  Some decreased appetite. Taking oxycodone.  She is followed by neurology for her seizures.  Stable.  Sees them 2x/year.  She previously had an extensive cardiac w/up.  Had heart cath approximately five years ago that was normal.  Previously had a history of migraine headaches, but these stopped with menopause.  Discussed diet and exercise.       Past Medical History  Diagnosis Date  . Hyperlipidemia   . Epilepsy     last seizure 4 yrs ago  . Arthritis   . Diverticulitis   . History of transfusion   . Hypothyroidism   . Hx of migraines     stopped with menopause  . Complication of anesthesia     severe tremors "shakes"  . Complication of anesthesia     dropped BP with epidurals with devlivery of daughters to 60/20    Outpatient Encounter Prescriptions as of 03/17/2014  Medication Sig  . clonazePAM (KLONOPIN) 0.5 MG tablet Take 0.5 mg by mouth 2 (two) times daily as needed for anxiety.  Marland Kitchen. lacosamide (VIMPAT) 200 MG TABS Take 200 mg by mouth 2 (two) times daily.  Marland Kitchen. levETIRAcetam (KEPPRA) 1000 MG tablet Take 1,500-2,000 mg by mouth See  admin instructions. Takes one and a half tablet (1500 mg) in the am and two tablets (2000mg ) at night.  . methocarbamol (ROBAXIN) 500 MG tablet Take 1 tablet (500 mg total) by mouth every 6 (six) hours as needed for muscle spasms.  . metoprolol succinate (TOPROL-XL) 50 MG 24 hr tablet Take 25 mg by mouth at bedtime. Take with or immediately following a meal.  . Multiple Vitamin (MULTIVITAMIN WITH MINERALS) TABS Take 1 tablet by mouth every morning.   Marland Kitchen. oxyCODONE-acetaminophen (ROXICET) 5-325 MG per tablet Take 1-2 tablets by mouth every 4 (four) hours as needed.  . polyethylene glycol (MIRALAX / GLYCOLAX) packet Take 17 g by mouth daily.  . rivaroxaban (XARELTO) 10 MG TABS tablet Take 1 tablet (10 mg total) by mouth daily with breakfast.  . rosuvastatin (CRESTOR) 5 MG tablet Take 5 mg by mouth every morning.  . triamterene-hydrochlorothiazide (DYAZIDE) 37.5-25 MG per capsule Take 1 capsule by mouth daily with breakfast.  . [DISCONTINUED] levothyroxine (SYNTHROID, LEVOTHROID) 137 MCG tablet Take 68.2 mcg by mouth daily before breakfast.    Review of Systems Patient denies any headache, lightheadedness or dizziness.  No sinus or allergy symptoms.   No chest pain, tightness or palpitations.  No increased shortness of breath, cough or congestion.  No nausea or vomiting.  No acid reflux.  Some decreased appetite since surgery.  No abdominal pain or cramping.  No bowel change, such as diarrhea,  BRBPR or melana.  Taking miralax.  Helping keep bowels regular.  No urine change.  Overall feels things are stable.  No seizures.  Is s/p knee surgery.  Doing well.  Just started therapy.        Objective:   Physical Exam  Filed Vitals:   03/17/14 0847  BP: 142/90  Pulse: 70  Temp: 97.7 F (36.5 C)   Blood pressure recheck:  92128/1184  58 year old female in no acute distress.   HEENT:  Nares- clear.  Oropharynx - without lesions. NECK:  Supple.  Nontender.  No audible bruit.  HEART:  Appears to be  regular. LUNGS:  No crackles or wheezing audible.  Respirations even and unlabored.  RADIAL PULSE:  Equal bilaterally.  ABDOMEN:  Soft, nontender.  Bowel sounds present and normal.  No audible abdominal bruit.   EXTREMITIES:  No increased edema present.  DP pulses palpable and equal bilaterally.          Assessment & Plan:  HEALTH MAINTENANCE. Plans to get her breast and pelvic exams through gyn.  Keep up to date with mammograms.  Last colonoscopy 2010.  Refer to Dr Valentino Saxonherry for gyn care.    Problem List Items Addressed This Visit   Arthritis of knee, right     S/p right knee surgery.  Receiving home health physical therapy.  Follow.       Relevant Orders      CBC with Differential (Completed)      Ferritin (Completed)      IBC panel (Completed)   Bradycardia     On metoprolol to 1/2 tablet q day.  Follow pressure and pulse rate.   Better.      Diverticulosis     Intermittent diverticular flares.  Previous colon perforation during last colonoscopy.  S/p partial colon resection.  Taking miralax to keep bowels stable.        Hypercholesterolemia - Primary     Low cholesterol diet and exercise.  On crestor.  Follow. Check lipid panel and liver function.       Relevant Orders      Lipid panel (Completed)      Basic metabolic panel (Completed)      Hepatic function panel (Completed)   Hypothyroidism     On levothyroxine.  Follow tsh.      Relevant Orders      TSH (Completed)   Migraine     Stopped with menopause.      Seizure disorder     Followed by neurology.  Maintained on Keppra.  Follow.  No recent seizures.       Other Visit Diagnoses   Encounter for immunization        Visit for gynecologic examination        Relevant Orders       Ambulatory referral to Gynecology      I spent 25 minutes with the patient and more than 50% of the time was spent in consultation regarding the above.

## 2014-03-21 NOTE — Assessment & Plan Note (Signed)
Followed by neurology.  Maintained on Keppra.  Follow.  No recent seizures.

## 2014-03-21 NOTE — Assessment & Plan Note (Signed)
Stopped with menopause.

## 2014-03-21 NOTE — Assessment & Plan Note (Signed)
On metoprolol to 1/2 tablet q day.  Follow pressure and pulse rate.   Better.

## 2014-03-21 NOTE — Assessment & Plan Note (Signed)
On levothyroxine.  Follow tsh.  

## 2014-03-21 NOTE — Assessment & Plan Note (Signed)
Low cholesterol diet and exercise.  On crestor.  Follow. Check lipid panel and liver function.

## 2014-03-21 NOTE — Assessment & Plan Note (Signed)
S/p right knee surgery.  Receiving home health physical therapy.  Follow.

## 2014-03-24 ENCOUNTER — Other Ambulatory Visit: Payer: Self-pay | Admitting: Internal Medicine

## 2014-05-12 ENCOUNTER — Telehealth: Payer: Self-pay

## 2014-05-12 NOTE — Telephone Encounter (Signed)
Have her come in for tsh, so that we know her thyroid level.  Also, she needs to make sure her insurance will cover DPT to be given here.  thanks

## 2014-05-12 NOTE — Telephone Encounter (Signed)
I am ok if she restarts the 137mcg synthroid q day.  I would like to recheck fasting labs prior to her February appt.  With those labs, we will check her tsh then.  Let me know if any problems or questions.

## 2014-05-12 NOTE — Telephone Encounter (Signed)
The patient called and is hoping to get her thyroid medication changed. She states with the increase dosage, she is having side affects.  The patient also stated she needs a DPT shot perscription send [facility]

## 2014-05-12 NOTE — Telephone Encounter (Signed)
Please advise 

## 2014-05-12 NOTE — Telephone Encounter (Signed)
Pt states that she has been off of her thyroid meds not & TSH will be inaccurate. She wants to know if she can go back to her previous dose that worked well for her. Please advise

## 2014-05-13 ENCOUNTER — Other Ambulatory Visit: Payer: Self-pay | Admitting: *Deleted

## 2014-05-13 ENCOUNTER — Other Ambulatory Visit: Payer: Managed Care, Other (non HMO)

## 2014-05-13 MED ORDER — LEVOTHYROXINE SODIUM 137 MCG PO TABS: 137.0000 ug | ORAL_TABLET | Freq: Every day | ORAL | Status: DC

## 2014-05-13 NOTE — Telephone Encounter (Signed)
Rx sent to local pharmacy for a 90 day supply with one refill. Also left a voicemail to notify patient & asked her to return my call (ext given on voicemail also)

## 2014-05-13 NOTE — Telephone Encounter (Signed)
Left voicemail notifying patient that a 90 day supply of her thyroid medication was sent to her local pharmacy. I also asked pt to call me back for some additional information that I need to go over (I also left her my extension on her voicemail)

## 2014-05-14 NOTE — Telephone Encounter (Signed)
Pt was notified again of Rx that was sent to pharmacy & also aware to schedule a fasting lab appt prior to upcoming appt.

## 2014-07-23 ENCOUNTER — Ambulatory Visit (INDEPENDENT_AMBULATORY_CARE_PROVIDER_SITE_OTHER): Payer: Managed Care, Other (non HMO) | Admitting: Internal Medicine

## 2014-07-23 ENCOUNTER — Encounter: Payer: Self-pay | Admitting: Internal Medicine

## 2014-07-23 VITALS — BP 122/70 | HR 54 | Temp 97.9°F | Ht 63.25 in | Wt 241.1 lb

## 2014-07-23 DIAGNOSIS — M129 Arthropathy, unspecified: Secondary | ICD-10-CM

## 2014-07-23 DIAGNOSIS — M1711 Unilateral primary osteoarthritis, right knee: Secondary | ICD-10-CM

## 2014-07-23 DIAGNOSIS — G40909 Epilepsy, unspecified, not intractable, without status epilepticus: Secondary | ICD-10-CM

## 2014-07-23 DIAGNOSIS — E7800 Pure hypercholesterolemia, unspecified: Secondary | ICD-10-CM

## 2014-07-23 DIAGNOSIS — K5730 Diverticulosis of large intestine without perforation or abscess without bleeding: Secondary | ICD-10-CM

## 2014-07-23 DIAGNOSIS — D649 Anemia, unspecified: Secondary | ICD-10-CM

## 2014-07-23 DIAGNOSIS — E039 Hypothyroidism, unspecified: Secondary | ICD-10-CM

## 2014-07-23 DIAGNOSIS — G43809 Other migraine, not intractable, without status migrainosus: Secondary | ICD-10-CM

## 2014-07-23 MED ORDER — CLONAZEPAM 0.5 MG PO TABS: ORAL_TABLET | ORAL | Status: AC

## 2014-07-23 NOTE — Progress Notes (Signed)
Pre visit review using our clinic review tool, if applicable. No additional management support is needed unless otherwise documented below in the visit note. 

## 2014-07-23 NOTE — Progress Notes (Signed)
Patient ID: Darlene Ford, female   DOB: 07/18/1955, 59 y.o.   MRN: [id number]   Subjective:    Patient ID: Darlene Ford, female    DOB: 05/08/1956, 59 y.o.   MRN: [id number]  HPI  Patient here for a scheduled follow up.  States she is doing relatively well.  Had her knee surgery - right knee.  Back to work on 06/28/14.  Has done well.  Planning to start exercising at [employer].  Has adjusted her diet.  Lost some weight.  Feels better.  Not taking any pain medication.  Is not taking her thyroid medication correctly.  Only taking 1/2 of a 137mcg three times per week.  Breathing stable.  No cardiac symptoms with increased activity and exertion.  Bowels stable.  Some increased stress with her mother living with her.  Feels she is handling things relatively well.     Past Medical History  Diagnosis Date  . Hyperlipidemia   . Epilepsy     last seizure 4 yrs ago  . Arthritis   . Diverticulitis   . History of transfusion   . Hypothyroidism   . Hx of migraines     stopped with menopause  . Complication of anesthesia     severe tremors "shakes"  . Complication of anesthesia     dropped BP with epidurals with devlivery of daughters to 60/20    Current Outpatient Prescriptions on File Prior to Visit  Medication Sig Dispense Refill  . CRESTOR 5 MG tablet TAKE 1 TABLET (5 MG TOTAL) BY MOUTH DAILY. 90 tablet 1  . lacosamide (VIMPAT) 200 MG TABS Take 200 mg by mouth 2 (two) times daily.    Marland Kitchen. levETIRAcetam (KEPPRA) 1000 MG tablet Take 1,500-2,000 mg by mouth See admin instructions. Takes one and a half tablet (1500 mg) in the am and two tablets (2000mg ) at night.    . levothyroxine (SYNTHROID, LEVOTHROID) 137 MCG tablet Take 1 tablet (137 mcg total) by mouth daily. 90 tablet 1  . metoprolol succinate (TOPROL-XL) 50 MG 24 hr tablet Take 25 mg by mouth at bedtime. Take with or immediately following a meal.    . Multiple Vitamin (MULTIVITAMIN WITH MINERALS) TABS Take 1 tablet by mouth every  morning.     . polyethylene glycol (MIRALAX / GLYCOLAX) packet Take 17 g by mouth daily.    . rosuvastatin (CRESTOR) 5 MG tablet Take 5 mg by mouth every morning.    . triamterene-hydrochlorothiazide (DYAZIDE) 37.5-25 MG per capsule TAKE 1 EACH (1 CAPSULE TOTAL) BY MOUTH DAILY BEFORE BREAKFAST. 90 capsule 1   No current facility-administered medications on file prior to visit.    Review of Systems  Constitutional: Negative for appetite change and unexpected weight change.       Has adjusted her diet and is losing weight.    HENT: Negative for congestion and sinus pressure.   Respiratory: Negative for cough, chest tightness and shortness of breath.   Cardiovascular: Negative for chest pain, palpitations and leg swelling.  Gastrointestinal: Negative for nausea, vomiting, abdominal pain and diarrhea.  Musculoskeletal: Negative for joint swelling.       Knee is doing well s/p surgery.  Planning to start exercising.    Skin: Negative for color change and rash.  Neurological: Negative for dizziness, light-headedness and headaches.  Psychiatric/Behavioral:       Feels she is handling stress well.         Objective:     Blood pressure recheck:  136/82,  pulse 60  Physical Exam  Constitutional: She appears well-developed and well-nourished. No distress.  HENT:  Nose: Nose normal.  Mouth/Throat: Oropharynx is clear and moist.  Neck: Neck supple. No thyromegaly present.  Cardiovascular: Normal rate and regular rhythm.   Pulmonary/Chest: Breath sounds normal. No respiratory distress. She has no wheezes.  Abdominal: Soft. Bowel sounds are normal. There is no tenderness.  Musculoskeletal: She exhibits no edema or tenderness.  Lymphadenopathy:    She has no cervical adenopathy.  Psychiatric: She has a normal mood and affect. Her behavior is normal.    BP 122/70 mmHg  Pulse 54  Temp(Src) 97.9 F (36.6 C) (Oral)  Ht 5' 3.25" (1.607 m)  Wt 241 lb 2 oz (109.374 kg)  BMI 42.35 kg/m2   SpO2 98% Wt Readings from Last 3 Encounters:  07/23/14 241 lb 2 oz (109.374 kg)  03/17/14 239 lb (108.41 kg)  03/05/14 242 lb (109.77 kg)     Lab Results  Component Value Date   WBC 5.5 03/17/2014   HGB 11.6* 03/17/2014   HCT 35.8* 03/17/2014   PLT 466.0* 03/17/2014   GLUCOSE 101* 03/17/2014   CHOL 182 03/17/2014   TRIG 255.0* 03/17/2014   HDL 38.00* 03/17/2014   LDLDIRECT 104.6 03/17/2014   LDLCALC 133* 09/21/2013   ALT 28 03/17/2014   AST 19 03/17/2014   NA 137 03/17/2014   K 4.4 03/17/2014   CL 100 03/17/2014   CREATININE 0.8 03/17/2014   BUN 14 03/17/2014   CO2 26 03/17/2014   TSH 6.07* 03/17/2014   INR 1.01 02/24/2014   HGBA1C 5.6 09/21/2013       Assessment & Plan:   Problem List Items Addressed This Visit    Anemia    Check cbc and ferritin.  Follow.        Relevant Orders   CBC with Differential/Platelet   Ferritin   Arthritis of knee, right    S/p knee surgery.  Doing well.  Planning to start exercising at [employer].  Follow.        Diverticulosis    Bowels stable.  Follow.        Hypercholesterolemia    On crestor.  Follow lipid panel and liver function tests.  Low cholesterol diet and exercise.        Relevant Orders   Lipid panel   Hypothyroidism    On thyroid replacement.  Follow tsh.        Relevant Orders   TSH   Migraine - Primary    Not an issue now.  Follow.        Relevant Medications   clonazePAM (KLONOPIN)  tablet   Seizure disorder    Followed by neurology.  Maintained on keppra.  No recent seizures.  Follow.        Relevant Orders   Hepatic function panel   Basic metabolic panel     I spent 25 minutes with the patient and more than 50% of the time was spent in consultation regarding the above.     Dale Taft, MD

## 2014-07-25 ENCOUNTER — Encounter: Payer: Self-pay | Admitting: Internal Medicine

## 2014-07-25 DIAGNOSIS — D649 Anemia, unspecified: Secondary | ICD-10-CM | POA: Insufficient documentation

## 2014-07-25 NOTE — Assessment & Plan Note (Signed)
Not an issue now.  Follow.    

## 2014-07-25 NOTE — Assessment & Plan Note (Signed)
S/p knee surgery.  Doing well.  Planning to start exercising at [employer].  Follow.

## 2014-07-25 NOTE — Assessment & Plan Note (Signed)
Followed by neurology.  Maintained on keppra.  No recent seizures.  Follow.

## 2014-07-25 NOTE — Assessment & Plan Note (Signed)
On thyroid replacement.  Follow tsh.  

## 2014-07-25 NOTE — Assessment & Plan Note (Signed)
Check cbc and ferritin.  Follow.

## 2014-07-25 NOTE — Assessment & Plan Note (Signed)
Bowels stable.  Follow.   

## 2014-07-25 NOTE — Assessment & Plan Note (Signed)
On crestor. Follow lipid panel and liver function tests.  Low cholesterol diet and exercise.  

## 2014-07-30 ENCOUNTER — Other Ambulatory Visit (INDEPENDENT_AMBULATORY_CARE_PROVIDER_SITE_OTHER): Payer: Managed Care, Other (non HMO)

## 2014-07-30 DIAGNOSIS — E7800 Pure hypercholesterolemia, unspecified: Secondary | ICD-10-CM

## 2014-07-30 DIAGNOSIS — D649 Anemia, unspecified: Secondary | ICD-10-CM

## 2014-07-30 DIAGNOSIS — E039 Hypothyroidism, unspecified: Secondary | ICD-10-CM

## 2014-07-30 DIAGNOSIS — G40909 Epilepsy, unspecified, not intractable, without status epilepticus: Secondary | ICD-10-CM

## 2014-07-30 LAB — CBC WITH DIFFERENTIAL/PLATELET
BASOS ABS: 0 10*3/uL (ref 0.0–0.1)
Basophils Relative: 0.3 % (ref 0.0–3.0)
EOS PCT: 3.8 % (ref 0.0–5.0)
Eosinophils Absolute: 0.2 10*3/uL (ref 0.0–0.7)
HEMATOCRIT: 37.1 % (ref 36.0–46.0)
Hemoglobin: 12.7 g/dL (ref 12.0–15.0)
LYMPHS ABS: 1.6 10*3/uL (ref 0.7–4.0)
Lymphocytes Relative: 28.4 % (ref 12.0–46.0)
MCHC: 34.2 g/dL (ref 30.0–36.0)
MCV: 89.3 fl (ref 78.0–100.0)
Monocytes Absolute: 0.4 10*3/uL (ref 0.1–1.0)
Monocytes Relative: 6.4 % (ref 3.0–12.0)
NEUTROS ABS: 3.5 10*3/uL (ref 1.4–7.7)
Neutrophils Relative %: 61.1 % (ref 43.0–77.0)
Platelets: 288 10*3/uL (ref 150.0–400.0)
RBC: 4.16 Mil/uL (ref 3.87–5.11)
RDW: 13.3 % (ref 11.5–15.5)
WBC: 5.7 10*3/uL (ref 4.0–10.5)

## 2014-07-30 LAB — BASIC METABOLIC PANEL WITH GFR
BUN: 14 mg/dL (ref 6–23)
CO2: 30 meq/L (ref 19–32)
Calcium: 9.5 mg/dL (ref 8.4–10.5)
Chloride: 104 meq/L (ref 96–112)
Creatinine, Ser: 0.69 mg/dL (ref 0.40–1.20)
GFR: 92.29 mL/min
Glucose, Bld: 95 mg/dL (ref 70–99)
Potassium: 4.3 meq/L (ref 3.5–5.1)
Sodium: 141 meq/L (ref 135–145)

## 2014-07-30 LAB — HEPATIC FUNCTION PANEL
ALT: 10 U/L (ref 0–35)
AST: 9 U/L (ref 0–37)
Albumin: 4.1 g/dL (ref 3.5–5.2)
Alkaline Phosphatase: 49 U/L (ref 39–117)
Bilirubin, Direct: 0.1 mg/dL (ref 0.0–0.3)
Total Bilirubin: 0.5 mg/dL (ref 0.2–1.2)
Total Protein: 6.5 g/dL (ref 6.0–8.3)

## 2014-07-30 LAB — FERRITIN: Ferritin: 39.2 ng/mL (ref 10.0–291.0)

## 2014-07-30 LAB — LIPID PANEL
CHOLESTEROL: 170 mg/dL (ref 0–200)
HDL: 38.9 mg/dL — ABNORMAL LOW (ref >39.00)
NonHDL: 131.1
Total CHOL/HDL Ratio: 4
Triglycerides: 223 mg/dL — ABNORMAL HIGH (ref 0.0–149.0)
VLDL: 44.6 mg/dL — ABNORMAL HIGH (ref 0.0–40.0)

## 2014-07-30 LAB — TSH: TSH: 8.8 u[IU]/mL — ABNORMAL HIGH (ref 0.35–4.50)

## 2014-07-30 LAB — LDL CHOLESTEROL, DIRECT: Direct LDL: 95 mg/dL

## 2014-08-01 ENCOUNTER — Other Ambulatory Visit: Payer: Self-pay | Admitting: Internal Medicine

## 2014-08-01 DIAGNOSIS — E039 Hypothyroidism, unspecified: Secondary | ICD-10-CM

## 2014-08-01 NOTE — Progress Notes (Signed)
Order placed for f/u tsh.  

## 2014-08-06 ENCOUNTER — Other Ambulatory Visit: Payer: Self-pay | Admitting: *Deleted

## 2014-08-06 ENCOUNTER — Encounter: Payer: Self-pay | Admitting: *Deleted

## 2014-08-06 MED ORDER — LEVOTHYROXINE SODIUM 50 MCG PO TABS: 50.0000 ug | ORAL_TABLET | Freq: Every day | ORAL | Status: DC

## 2014-09-13 ENCOUNTER — Other Ambulatory Visit: Payer: Managed Care, Other (non HMO)

## 2014-09-14 NOTE — H&P (Signed)
PATIENT NAME:  Darlene Ford, LAWHEAD MR#:  [id number] DATE OF BIRTH:  07/03/1955  DATE OF ADMISSION:  03/19/2012  PRIMARY CARE PHYSICIAN: Located in [place]   CHIEF COMPLAINT: Chest pain.   HISTORY OF PRESENT ILLNESS: This is a 59 year old female who presents to the Emergency Room complaining of chest pain/bilateral shoulder pain that began earlier today. 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 She was working this morning when she suddenly developed some bilateral shoulder pain and became a bit diaphoretic. The pain then radiated to the anterior part of her chest. She also became a little bit nauseated. She has never had these symptoms before. Her symptoms continued to persist therefore she was a bit concerned and came to the ER for further evaluation. She also has a very strong family history of coronary artery disease with her father dying from a massive heart attack and all of her father's brothers also passing away from massive heart attack. The patient currently still complains of some mild chest heaviness but no other associated symptoms. Hospitalist services were contacted for further treatment and evaluation.   REVIEW OF SYSTEMS: CONSTITUTIONAL: No documented fever. No weight gain, no weight loss. EYES: No blurred or double vision. ENT: No tinnitus. No postnasal drip. No redness of the oropharynx. RESPIRATORY: No cough, no wheeze, no hemoptysis, no dyspnea. CARDIOVASCULAR: Positive chest pain. No orthopnea, no palpitations, no syncope. GASTROINTESTINAL: No nausea, no vomiting, no diarrhea, no abdominal pain, no melena, no hematochezia. GENITOURINARY: No dysuria, no hematuria. ENDOCRINE: No polyuria or nocturia. No het or cold intolerance. HEME: No anemia, no bruising, no bleeding. INTEGUMENTARY: No rashes. No lesions. MUSCULOSKELETAL: No arthritis, no swelling, no gout. NEUROLOGIC: No numbness, no tingling, no ataxia, no seizure-type activity. PSYCH: No anxiety, no  insomnia, no ADD.   PAST MEDICAL HISTORY:  1. Hypertension. 2. Hypothyroidism. 3. Hyperlipidemia. 4. History of seizures.   ALLERGIES: No known drug allergies.   SOCIAL HISTORY: No smoking. Occasional alcohol use. No illicit drug abuse. Lives at home with her husband.   FAMILY HISTORY: Mother was alive, has history of atrial fibrillation. Father died from a massive myocardial infarction. Strong family history of coronary artery disease on the father's side of family.   CURRENT MEDICATIONS:  1. Aspirin 325 mg daily.  2. Crestor 5 mg daily.  3. HCTZ/triamterene 25/37.5, 1 tab daily.  4. Keppra 750 mg 1.75 tablets b.i.d.  5. Synthroid 88 mcg daily.  6. Lovaza 1000 mg 2 caps b.i.d.  7. Metoprolol succinate 50 mg daily.  8. Vimpat 200 mg b.i.d.   PHYSICAL EXAMINATION ON ADMISSION:  VITAL SIGNS: Patient's vital signs are noted to be: Temperature 98.6, pulse 59, respirations 16, blood pressure 114/64, sats 99% on room air.   GENERAL: The patient is a pleasant appearing female, no apparent distress.   HEENT: Atraumatic, normocephalic. Extraocular muscles are intact. Pupils equal, reactive to light. Sclerae anicteric. No conjunctival injection. No pharyngeal erythema.   NECK: Supple. There is no jugular venous distention, no bruits, no lymphadenopathy, no thyromegaly.   HEART: Regular rate and rhythm. No murmurs, no rubs, no clicks.   LUNGS: Clear to auscultation bilaterally. No rales, no rhonchi, no wheezes.   ABDOMEN: Soft, flat, nontender, nondistended. Has good bowel sounds. No hepatosplenomegaly appreciated.   EXTREMITIES: No evidence of any cyanosis, clubbing, or peripheral edema. Has +2 pedal and radial pulses bilaterally.   NEUROLOGICAL: Patient is alert, awake, oriented x3 with no focal motor or sensory deficits appreciated  bilaterally.  SKIN: Moist, warm with no rash appreciated.   LYMPHATIC: There is no cervical or axillary lymphadenopathy.   LABORATORY, DIAGNOSTIC  AND RADIOLOGICAL DATA: Serum glucose 88, BUN 19, creatinine 0.8, sodium 143, potassium 3.6, chloride 106, bicarbonate 29. LFTs are within normal limits. Troponin less than 0.02. White cell count 8.7, hemoglobin 13.6, hematocrit 39.3, platelet count 313.   Patient did have an EKG done which showed normal sinus rhythm with normal axes and no evidence of any acute ST or T wave changes.   ASSESSMENT AND PLAN: This is a 59 year old female with a history of hypertension, hyperlipidemia, seizures, hypothyroidism presented to the hospital with chest/bilateral shoulder blade pain.  1. Chest pain/bilateral shoulder blade pain. Patient has some typical but atypical symptoms of angina too. She does have a strong family history of coronary artery disease on her father's side. I will observe her overnight on telemetry. Follow serial cardiac markers. Continue aspirin, statin, some p.r.n. nitroglycerin, also some morphine as needed. Will get a Myoview in the a.m.  2. Hypertension. Continue hydrochlorothiazide and triamterene. Presently hemodynamically stable.  3. Hyperlipidemia: Continue Lovaza and lovastatin.  4. Hypothyroidism. Continue Synthroid.  5. History of seizures. Continue Vimpat and Keppra. No acute seizure-type activity.  6. CODE STATUS: Patient is a FULL CODE.   TIME SPENT WITH THE ADMISSION: 45 minutes.  ____________________________ Rolly PancakeVivek J. Cherlynn KaiserSainani, MD vjs:cms D: 03/19/2012 17:01:53 ET T: 03/19/2012 17:32:31 ET  JOB#: [id number] cc: Rolly PancakeVivek J. Cherlynn KaiserSainani, MD, <Dictator> Houston SirenVIVEK J SAINANI MD ELECTRONICALLY SIGNED 03/25/2012 8:17

## 2014-09-14 NOTE — Discharge Summary (Signed)
PATIENT NAME:  Darlene Ford, [place] A MR#:  [id number] DATE OF BIRTH:  [birth year]  DATE OF ADMISSION:  03/19/2012 DATE OF DISCHARGE:  03/20/2012  DIAGNOSES:  1. Atypical chest pain. 2. Shoulder pain. 3. Diaphoresis. 4. Hypertension. 5. Hyperlipidemia. 6. Seizure disorder.  7. Hypothyroidism.   DISPOSITION: Patient is being discharged home.   FOLLOW UP: Follow up with PCP in 1 to 2 weeks after discharge.   DIET: Low sodium, low fat, low cholesterol.   ACTIVITY: As tolerated.   DISCHARGE MEDICATIONS:  1. Vimpat 200 mg b.i.d.  2. Aspirin 325 mg daily.  3. HCTZ/triamterene 25/37.5, 1 tablet once a day. 4. Toprol-XL 50 mg daily.  5. Lovaza 1000 mg 2 capsules twice a day.  6. Keppra 750 mg 1.75 tablets twice a day. 7. Synthroid 88 mcg daily.  8. Crestor 5 mg daily.   LABORATORY, DIAGNOSTIC AND RADIOLOGICAL DATA: Inpatient nuclear stress test showed no evidence of any ischemia. Serial cardiac enzymes negative. CBC normal. Complete metabolic panel essentially normal.   HOSPITAL COURSE: Patient is a 59 year old female with past medical history of hypertension, hyperlipidemia, seizure disorder who presented with atypical symptoms of chest pain, diaphoresis, pain between her shoulders. She had a strong family history of heart disease and these were felt to be an anginal equivalent therefore patient was admitted to the hospital, rule out acute coronary syndrome. She ruled out with three sets of negative cardiac enzymes. She also underwent inpatient stress test which showed no evidence of any ischemia. The rest of her medical problems remained stable. She was hemodynamically stable and at her baseline with no additional complaints of symptoms by the time of discharge.   TIME SPENT: 45 minutes.   ____________________________ Darrick MeigsSangeeta Jaxan Michel, MD sp:cms D: 03/20/2012 13:37:16 ET T: 03/20/2012 14:23:42 ET JOB#: [id number]  cc: Darrick MeigsSangeeta Jerek Meulemans, MD, <Dictator>  Darrick MeigsSANGEETA Dene Landsberg MD ELECTRONICALLY  SIGNED 03/21/2012 7:19

## 2014-09-20 ENCOUNTER — Other Ambulatory Visit: Payer: Self-pay | Admitting: Internal Medicine

## 2014-09-22 ENCOUNTER — Other Ambulatory Visit: Payer: Self-pay | Admitting: Internal Medicine

## 2014-10-14 ENCOUNTER — Ambulatory Visit (INDEPENDENT_AMBULATORY_CARE_PROVIDER_SITE_OTHER): Payer: Managed Care, Other (non HMO) | Admitting: Internal Medicine

## 2014-10-14 ENCOUNTER — Encounter: Payer: Self-pay | Admitting: Internal Medicine

## 2014-10-14 VITALS — BP 124/80 | HR 65 | Temp 98.2°F | Wt 243.0 lb

## 2014-10-14 DIAGNOSIS — J069 Acute upper respiratory infection, unspecified: Secondary | ICD-10-CM

## 2014-10-14 DIAGNOSIS — B9789 Other viral agents as the cause of diseases classified elsewhere: Principal | ICD-10-CM

## 2014-10-14 MED ORDER — HYDROCODONE-HOMATROPINE 5-1.5 MG/5ML PO SYRP: 5.0000 mL | ORAL_SOLUTION | Freq: Three times a day (TID) | ORAL | Status: DC | PRN

## 2014-10-14 NOTE — Progress Notes (Signed)
HPI  Pt presents to the clinic today with c/o sore throat, cough and chest congestion. This started 4-5 days ago. The cough is non productive. She is having some burning chest pain from coughing so much. She denies fever, chills body aches or shortness of breath. She has tried Delsym and Nyquil with minimal relief. She has no history of allergies or breathing problems. She has had sick contacts. She does not smoke.  Review of Systems      Past Medical History  Diagnosis Date  . Hyperlipidemia   . Epilepsy     last seizure 4 yrs ago  . Arthritis   . Diverticulitis   . History of transfusion   . Hypothyroidism   . Hx of migraines     stopped with menopause  . Complication of anesthesia     severe tremors "shakes"  . Complication of anesthesia     dropped BP with epidurals with devlivery of daughters to 60/20    Family History  Problem Relation Age of Onset  . Lung cancer Mother   . Hyperlipidemia Mother   . Heart disease Father     History   Social History  . Marital Status: Married    Spouse Name: N/A  . Number of Children: N/A  . Years of Education: N/A   Occupational History  . Not on file.   Social History Main Topics  . Smoking status: Never Smoker   . Smokeless tobacco: Never Used  . Alcohol Use: No  . Drug Use: No  . Sexual Activity: Not on file   Other Topics Concern  . Not on file   Social History Narrative    No Known Allergies   Constitutional: Denies headache, fatigue, fever or abrupt weight changes.  HEENT:  Positive sore throat. Denies eye redness, eye pain, pressure behind the eyes, facial pain, nasal congestion, ear pain, ringing in the ears, wax buildup, runny nose or bloody nose. Respiratory: Positive cough. Denies difficulty breathing or shortness of breath.  Cardiovascular: Denies chest pain, chest tightness, palpitations or swelling in the hands or feet.   No other specific complaints in a complete review of systems (except as listed  in HPI above).  Objective:   BP 124/80 mmHg  Pulse 65  Temp(Src) 98.2 F (36.8 C) (Oral)  Wt 243 lb (110.224 kg)  SpO2 98%    Wt Readings from Last 3 Encounters:  10/14/14 243 lb (110.224 kg)  07/23/14 241 lb 2 oz (109.374 kg)  03/17/14 239 lb (108.41 kg)     General: Appears her stated age, in NAD. HEENT: Head: normal shape and size, no sinus tenderness noted; Eyes: sclera white, no icterus, conjunctiva pink; Ears: Tm's gray and intact, normal light reflex;  Throat/Mouth: Teeth present, mucosa pink and moist, no exudate noted, no lesions or ulcerations noted.  Neck: No cervical lymphadenopathy.  Cardiovascular: Normal rate and rhythm. S1,S2 noted.  No murmur, rubs or gallops noted.  Pulmonary/Chest: Normal effort and positive vesicular breath sounds. No respiratory distress. No wheezes, rales or ronchi noted.      Assessment & Plan:   Viral Upper Respiratory Infection with Cough:  Get some rest and drink plenty of water Do salt water gargles for the sore throat Ibuprofen as needed for inflammation and pain Rx for Hycodan cough syrup  RTC as needed or if symptoms persist.

## 2014-10-14 NOTE — Patient Instructions (Signed)
Cough, Adult  A cough is a reflex that helps clear your throat and airways. It can help heal the body or may be a reaction to an irritated airway. A cough may only last 2 or 3 weeks (acute) or may last more than 8 weeks (chronic).  CAUSES Acute cough:  Viral or bacterial infections. Chronic cough:  Infections.  Allergies.  Asthma.  Post-nasal drip.  Smoking.  Heartburn or acid reflux.  Some medicines.  Chronic lung problems (COPD).  Cancer. SYMPTOMS   Cough.  Fever.  Chest pain.  Increased breathing rate.  High-pitched whistling sound when breathing (wheezing).  Colored mucus that you cough up (sputum). TREATMENT   A bacterial cough may be treated with antibiotic medicine.  A viral cough must run its course and will not respond to antibiotics.  Your caregiver may recommend other treatments if you have a chronic cough. HOME CARE INSTRUCTIONS   Only take over-the-counter or prescription medicines for pain, discomfort, or fever as directed by your caregiver. Use cough suppressants only as directed by your caregiver.  Use a cold steam vaporizer or humidifier in your bedroom or home to help loosen secretions.  Sleep in a semi-upright position if your cough is worse at night.  Rest as needed.  Stop smoking if you smoke. SEEK IMMEDIATE MEDICAL CARE IF:   You have pus in your sputum.  Your cough starts to worsen.  You cannot control your cough with suppressants and are losing sleep.  You begin coughing up blood.  You have difficulty breathing.  You develop pain which is getting worse or is uncontrolled with medicine.  You have a fever. MAKE SURE YOU:   Understand these instructions.  Will watch your condition.  Will get help right away if you are not doing well or get worse. Document Released: 11/10/2010 Document Revised: 08/06/2011 Document Reviewed: 11/10/2010 ExitCare Patient Information 2015 [employer]. This information is not intended  to replace advice given to you by your health care provider. Make sure you discuss any questions you have with your health care provider.  

## 2014-10-14 NOTE — Progress Notes (Signed)
Pre visit review using our clinic review tool, if applicable. No additional management support is needed unless otherwise documented below in the visit note. 

## 2014-11-24 ENCOUNTER — Ambulatory Visit (INDEPENDENT_AMBULATORY_CARE_PROVIDER_SITE_OTHER): Payer: Managed Care, Other (non HMO) | Admitting: Internal Medicine

## 2014-11-24 ENCOUNTER — Other Ambulatory Visit: Payer: Self-pay | Admitting: Internal Medicine

## 2014-11-24 ENCOUNTER — Encounter: Payer: Self-pay | Admitting: Internal Medicine

## 2014-11-24 VITALS — BP 110/70 | HR 56 | Temp 97.8°F | Ht 63.25 in | Wt 245.0 lb

## 2014-11-24 DIAGNOSIS — Z1239 Encounter for other screening for malignant neoplasm of breast: Secondary | ICD-10-CM

## 2014-11-24 DIAGNOSIS — E039 Hypothyroidism, unspecified: Secondary | ICD-10-CM

## 2014-11-24 DIAGNOSIS — G40909 Epilepsy, unspecified, not intractable, without status epilepticus: Secondary | ICD-10-CM

## 2014-11-24 DIAGNOSIS — E7800 Pure hypercholesterolemia, unspecified: Secondary | ICD-10-CM

## 2014-11-24 DIAGNOSIS — D649 Anemia, unspecified: Secondary | ICD-10-CM

## 2014-11-24 DIAGNOSIS — Z0000 Encounter for general adult medical examination without abnormal findings: Secondary | ICD-10-CM

## 2014-11-24 DIAGNOSIS — M25512 Pain in left shoulder: Secondary | ICD-10-CM

## 2014-11-24 LAB — BASIC METABOLIC PANEL
BUN: 12 mg/dL (ref 6–23)
CALCIUM: 9.7 mg/dL (ref 8.4–10.5)
CO2: 30 mEq/L (ref 19–32)
CREATININE: 0.75 mg/dL (ref 0.40–1.20)
Chloride: 102 mEq/L (ref 96–112)
GFR: 83.73 mL/min (ref >60.00)
Glucose, Bld: 105 mg/dL — ABNORMAL HIGH (ref 70–99)
Potassium: 3.9 mEq/L (ref 3.5–5.1)
SODIUM: 142 meq/L (ref 135–145)

## 2014-11-24 LAB — LIPID PANEL
Cholesterol: 177 mg/dL (ref 0–200)
HDL: 41.7 mg/dL (ref >39.00)
NonHDL: 135.3
Total CHOL/HDL Ratio: 4
Triglycerides: 203 mg/dL — ABNORMAL HIGH (ref 0.0–149.0)
VLDL: 40.6 mg/dL — ABNORMAL HIGH (ref 0.0–40.0)

## 2014-11-24 LAB — HEPATIC FUNCTION PANEL
ALBUMIN: 4.1 g/dL (ref 3.5–5.2)
ALK PHOS: 45 U/L (ref 39–117)
ALT: 12 U/L (ref 0–35)
AST: 13 U/L (ref 0–37)
Bilirubin, Direct: 0 mg/dL (ref 0.0–0.3)
Total Bilirubin: 0.4 mg/dL (ref 0.2–1.2)
Total Protein: 7.1 g/dL (ref 6.0–8.3)

## 2014-11-24 LAB — LDL CHOLESTEROL, DIRECT: Direct LDL: 98 mg/dL

## 2014-11-24 LAB — TSH: TSH: 10.93 u[IU]/mL — ABNORMAL HIGH (ref 0.35–4.50)

## 2014-11-24 NOTE — Progress Notes (Signed)
Order placed for tsh f/u.

## 2014-11-24 NOTE — Progress Notes (Signed)
Pre visit review using our clinic review tool, if applicable. No additional management support is needed unless otherwise documented below in the visit note. 

## 2014-11-24 NOTE — Progress Notes (Signed)
Patient ID: Darlene Ford, female   DOB: 02/24/1956, 59 y.o.   MRN: [id number]   Subjective:    Patient ID: Darlene Ford, female    DOB: 10/29/1955, 59 y.o.   MRN: [id number]  HPI  Patient here for a scheduled follow up.  Increased stress.  Some left shoulder pain intermittently.  Notices more when lifts arm.  No limited rom.  Notices if reaches around.  No cardiac symptoms with increased activity or exertion.  Breathing stable.  Back on 1/2 of synthroid dose.  Did not tolerate the lower dose daily.  Bowels stable.  Increased stress.  Mother lives with her.  Handling stress well.     Past Medical History  Diagnosis Date  . Hyperlipidemia   . Epilepsy     last seizure 4 yrs ago  . Arthritis   . Diverticulitis   . History of transfusion   . Hypothyroidism   . Hx of migraines     stopped with menopause  . Complication of anesthesia     severe tremors "shakes"  . Complication of anesthesia     dropped BP with epidurals with devlivery of daughters to 60/20    Current Outpatient Prescriptions on File Prior to Visit  Medication Sig Dispense Refill  . clonazePAM (KLONOPIN) 0.5 MG tablet 1/2 -1 tablet q day to bid prn 30 tablet 0  . CRESTOR 5 MG tablet TAKE 1 TABLET BY MOUTH DAILY 90 tablet 1  . lacosamide (VIMPAT) 200 MG TABS Take 200 mg by mouth 2 (two) times daily.    Marland Kitchen. levETIRAcetam (KEPPRA) 1000 MG tablet Take 1,500-2,000 mg by mouth See admin instructions. Takes one and a half tablet (1500 mg) in the am and two tablets (2000mg ) at night.    . metoprolol succinate (TOPROL-XL) 50 MG 24 hr tablet Take 25 mg by mouth at bedtime. Take with or immediately following a meal.    . Multiple Vitamin (MULTIVITAMIN WITH MINERALS) TABS Take 1 tablet by mouth every morning.     . polyethylene glycol (MIRALAX / GLYCOLAX) packet Take 17 g by mouth daily.    . rosuvastatin (CRESTOR) 5 MG tablet Take 5 mg by mouth every morning.    . triamterene-hydrochlorothiazide (DYAZIDE) 37.5-25 MG per  capsule TAKE 1 EACH (1 CAPSULE TOTAL) BY MOUTH DAILY BEFORE BREAKFAST. 90 capsule 1   No current facility-administered medications on file prior to visit.    Review of Systems  Constitutional: Negative for appetite change and unexpected weight change.  HENT: Negative for congestion and sinus pressure.   Respiratory: Negative for cough, chest tightness and shortness of breath.   Cardiovascular: Negative for chest pain, palpitations and leg swelling.  Gastrointestinal: Negative for nausea, vomiting, abdominal pain and diarrhea.  Musculoskeletal:       Shoulder pain as outlined.  Hurts with reaching across her body or lifting.    Skin: Negative for color change and rash.  Neurological: Negative for dizziness, light-headedness and headaches.  Psychiatric/Behavioral: Negative for agitation.       Increased stress.        Objective:     Pulse recheck:  60  Physical Exam  Constitutional: She appears well-developed and well-nourished. No distress.  HENT:  Nose: Nose normal.  Mouth/Throat: Oropharynx is clear and moist.  Neck: Neck supple. No thyromegaly present.  Cardiovascular: Normal rate and regular rhythm.   Pulmonary/Chest: Breath sounds normal. No respiratory distress. She has no wheezes.  Abdominal: Soft. Bowel sounds are normal. There is no  tenderness.  Musculoskeletal: She exhibits no edema or tenderness.  Increased discomfort - left shoulder with full extension and with reaching around her body.    Lymphadenopathy:    She has no cervical adenopathy.  Skin: No rash noted. No erythema.  Psychiatric: She has a normal mood and affect. Her behavior is normal.    BP 110/70 mmHg  Pulse 56  Temp(Src) 97.8 F (36.6 C) (Oral)  Ht 5' 3.25" (1.607 m)  Wt 245 lb (111.131 kg)  BMI 43.03 kg/m2  SpO2 96% Wt Readings from Last 3 Encounters:  11/24/14 245 lb (111.131 kg)  10/14/14 243 lb (110.224 kg)  07/23/14 241 lb 2 oz (109.374 kg)     Lab Results  Component Value Date    WBC 5.7 07/30/2014   HGB 12.7 07/30/2014   HCT 37.1 07/30/2014   PLT 288.0 07/30/2014   GLUCOSE 105* 11/24/2014   CHOL 177 11/24/2014   TRIG 203.0* 11/24/2014   HDL 41.70 11/24/2014   LDLDIRECT 98.0 11/24/2014   LDLCALC 133* 09/21/2013   ALT 12 11/24/2014   AST 13 11/24/2014   NA 142 11/24/2014   K 3.9 11/24/2014   CL 102 11/24/2014   CREATININE 0.75 11/24/2014   BUN 12 11/24/2014   CO2 30 11/24/2014   TSH 10.93* 11/24/2014   INR 1.01 02/24/2014   HGBA1C 5.6 09/21/2013       Assessment & Plan:   Problem List Items Addressed This Visit    Anemia    Follow cbc and ferritin.        Health care maintenance    Sees gyn.  Mammogram 11/05/13 - Birads I.  Schedule f/u mammogram.  Colonoscopy 05/28/08 - diverticulosis.  Overdue f/u.  She plans to schedule in the fall.        Hypercholesterolemia    Low cholesterol diet and exercise.  On crestor.  Follow lipid panel and liver function tests.       Relevant Orders   Hepatic function panel (Completed)   Lipid panel (Completed)   Basic metabolic panel (Completed)   Hypothyroidism - Primary    On synthroid.  Does not tolerate taking the medication daily.  We have tried lower dose on a daily schedule.  Did not tolerate.  Recheck tsh today. Adjust dose accordingly.        Relevant Orders   TSH   Left shoulder pain    Will follow.  Desires no further intervention at this time.  Follow.       Seizure disorder    Maintained on keppra.  Followed by neurology.  No recent seizures.        Other Visit Diagnoses    Breast cancer screening        Relevant Orders    MM DIGITAL SCREENING BILATERAL      I spent 25 minutes with the patient and more than 50% of the time was spent in consultation regarding the above.     Dale Snoh[place], MD

## 2014-11-25 ENCOUNTER — Other Ambulatory Visit: Payer: Self-pay | Admitting: *Deleted

## 2014-11-25 ENCOUNTER — Encounter: Payer: Self-pay | Admitting: Internal Medicine

## 2014-11-25 DIAGNOSIS — Z0000 Encounter for general adult medical examination without abnormal findings: Secondary | ICD-10-CM | POA: Insufficient documentation

## 2014-11-25 DIAGNOSIS — M25512 Pain in left shoulder: Secondary | ICD-10-CM | POA: Insufficient documentation

## 2014-11-25 IMAGING — CR DG HIP COMPLETE 2+V*L*
1 series · 2 of 2 positions shown · non-contrast
Comparison: none

REASON FOR EXAM: pain with hx of hip replacement
COMMENTS:

PROCEDURE:     DXR - DXR HIP LEFT COMPLETE  - August 19, 2012  [DATE]
RESULT:     Comparison: None.

[Series 1: t hip ap left · 0.14mm/px · 2 of 2 slices shown]
[im 1/2]
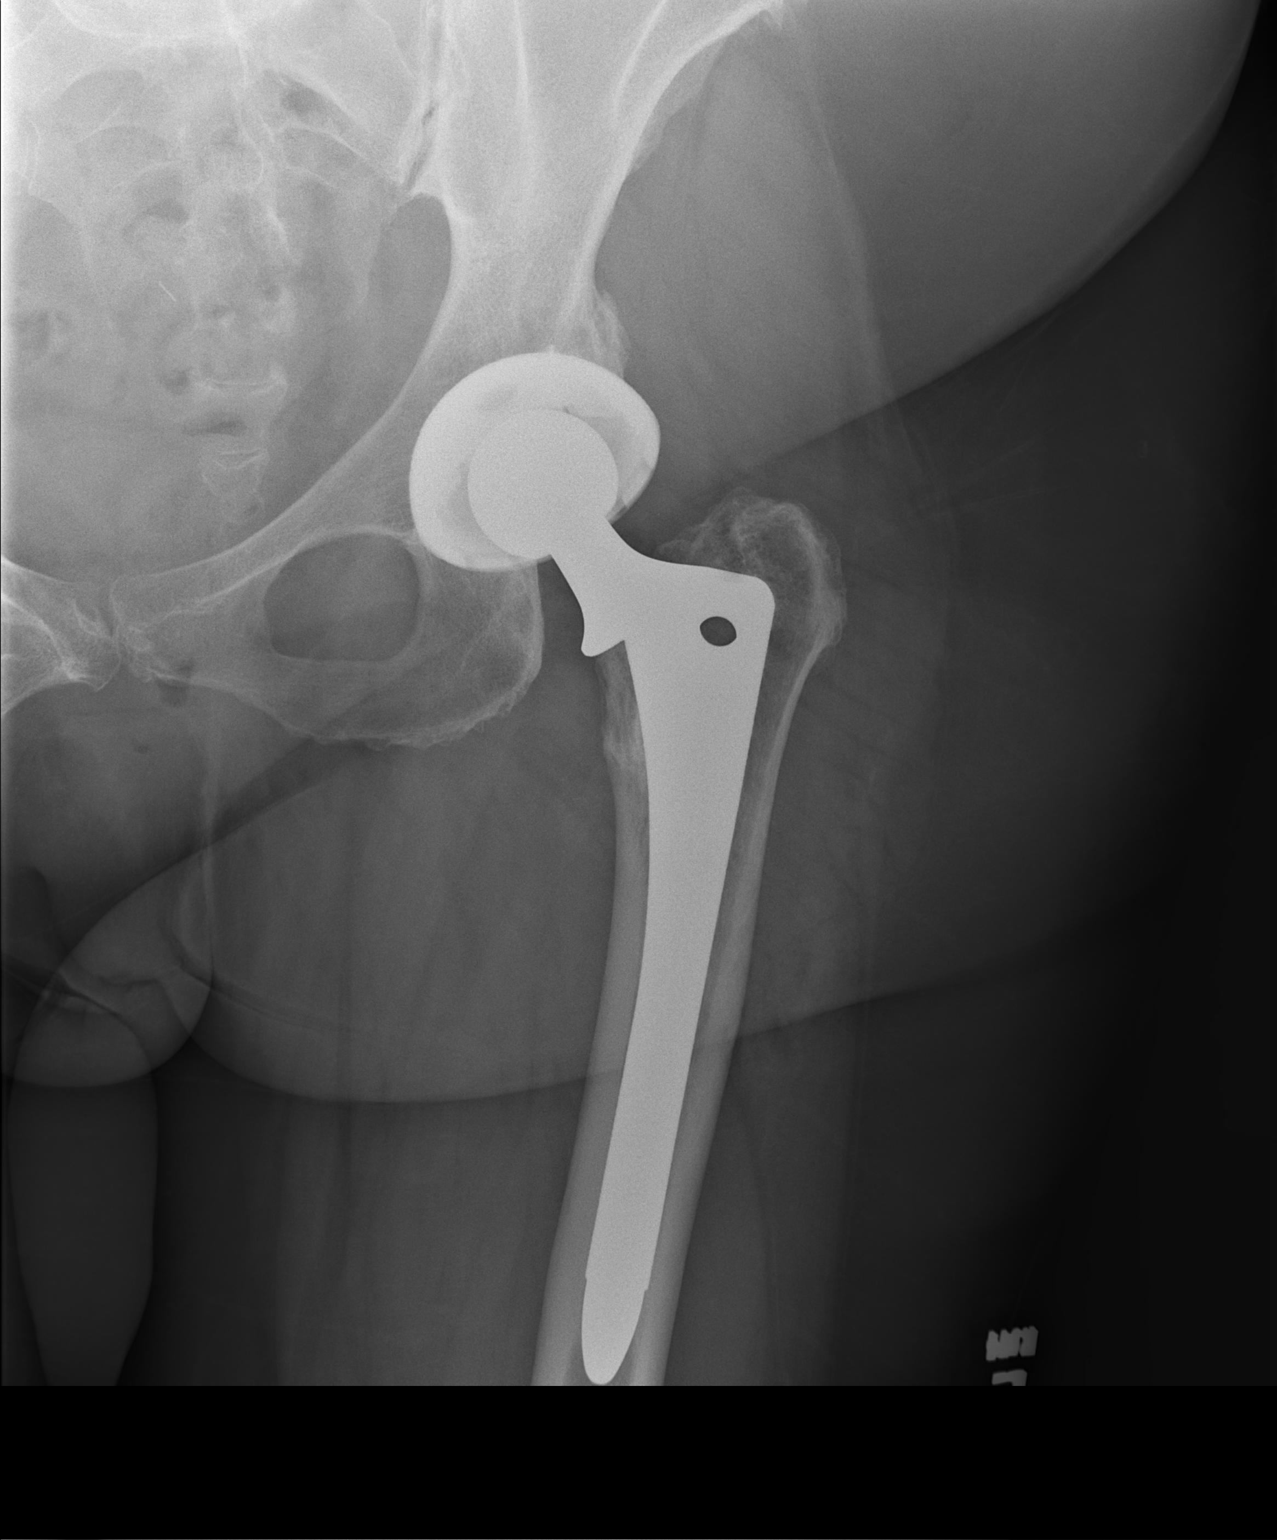
[im 2/2]
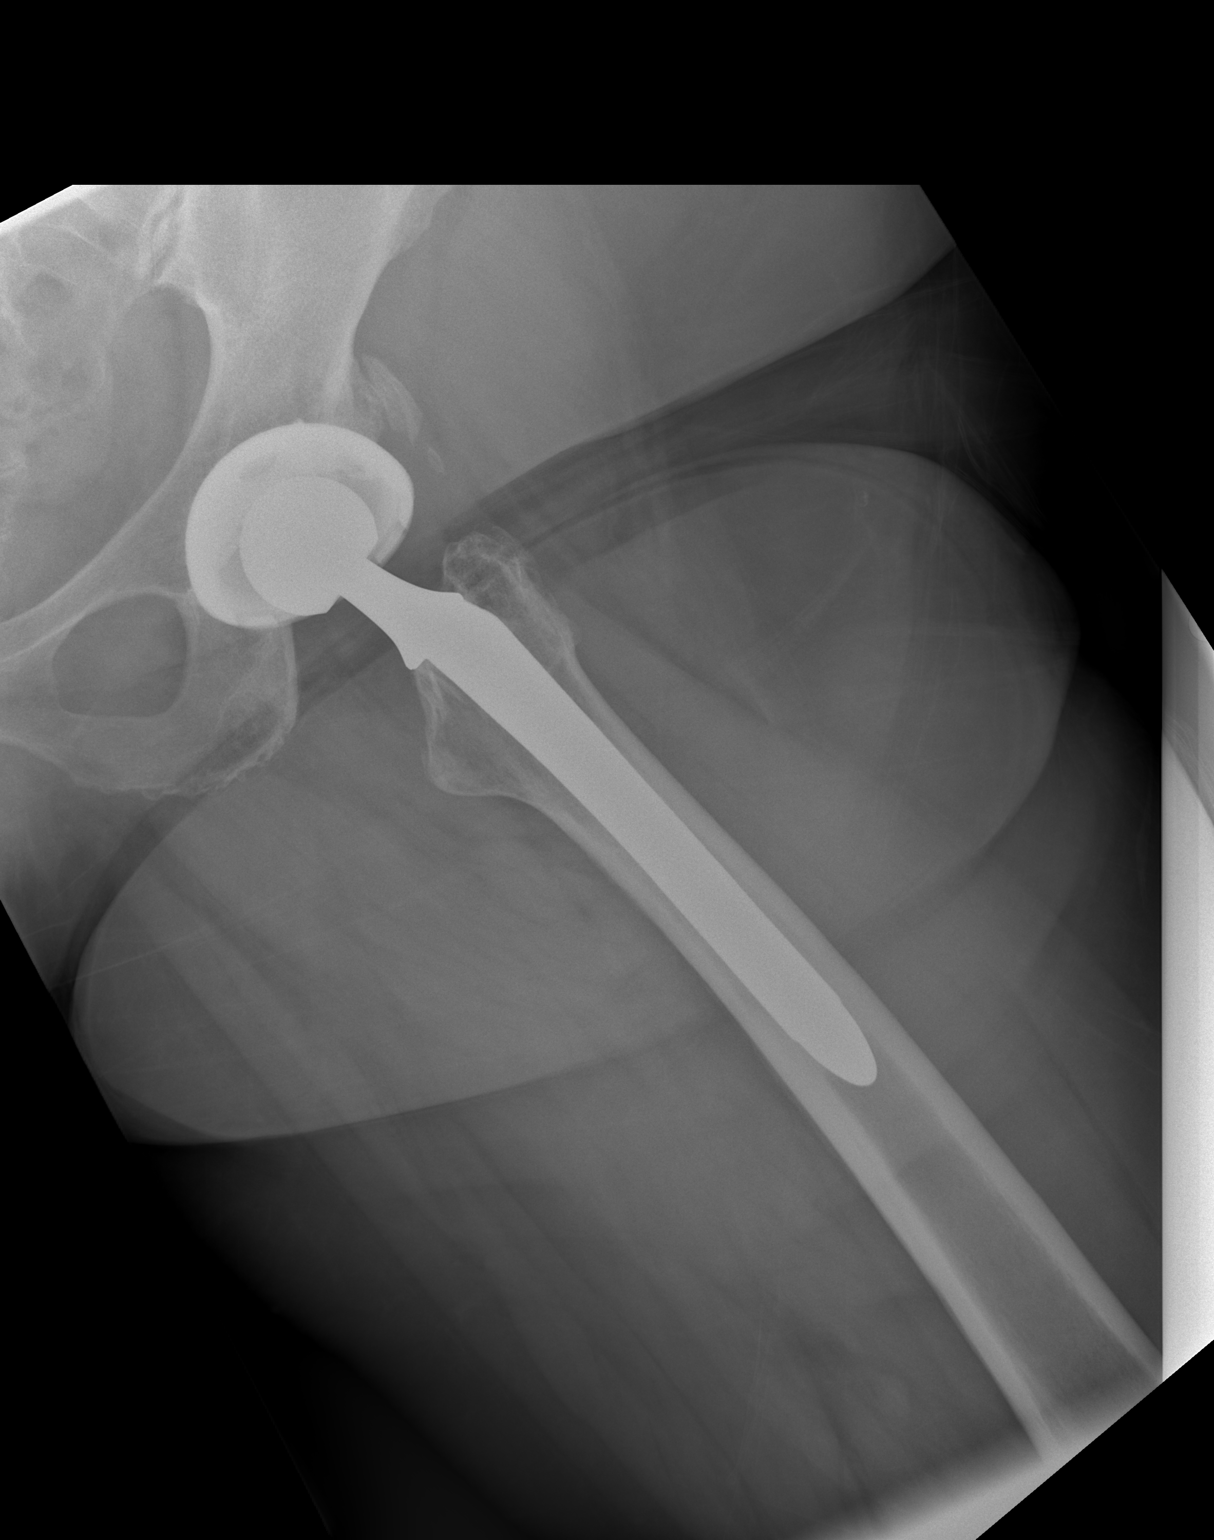

[2 of 2 positions shown; findings below may reference images not displayed]

FINDINGS: Hardware seen from left total hip arthroplasty. No acute fracture. No
periprosthetic lucency.
IMPRESSION: No acute findings.

[REDACTED]

## 2014-11-25 MED ORDER — LEVOTHYROXINE SODIUM 75 MCG PO TABS: ORAL_TABLET | ORAL | Status: DC

## 2014-11-25 NOTE — Assessment & Plan Note (Signed)
On synthroid.  Does not tolerate taking the medication daily.  We have tried lower dose on a daily schedule.  Did not tolerate.  Recheck tsh today. Adjust dose accordingly.

## 2014-11-25 NOTE — Assessment & Plan Note (Signed)
Low cholesterol diet and exercise.  On crestor.  Follow lipid panel and liver function tests.  

## 2014-11-25 NOTE — Assessment & Plan Note (Signed)
Maintained on keppra.  Followed by neurology.  No recent seizures.

## 2014-11-25 NOTE — Assessment & Plan Note (Signed)
Follow cbc and ferritin.  

## 2014-11-25 NOTE — Assessment & Plan Note (Signed)
Will follow.  Desires no further intervention at this time.  Follow.

## 2014-11-25 NOTE — Assessment & Plan Note (Signed)
Sees gyn.  Mammogram 11/05/13 - Birads I.  Schedule f/u mammogram.  Colonoscopy 05/28/08 - diverticulosis.  Overdue f/u.  She plans to schedule in the fall.

## 2014-12-07 ENCOUNTER — Other Ambulatory Visit: Payer: Self-pay | Admitting: Internal Medicine

## 2015-01-06 ENCOUNTER — Other Ambulatory Visit: Payer: Managed Care, Other (non HMO)

## 2015-01-10 ENCOUNTER — Other Ambulatory Visit: Payer: Self-pay | Admitting: *Deleted

## 2015-01-10 MED ORDER — LEVOTHYROXINE SODIUM 75 MCG PO TABS: ORAL_TABLET | ORAL | Status: DC

## 2015-01-11 ENCOUNTER — Other Ambulatory Visit: Payer: Self-pay

## 2015-01-11 MED ORDER — LEVOTHYROXINE SODIUM 75 MCG PO TABS: ORAL_TABLET | ORAL | Status: AC

## 2015-03-16 ENCOUNTER — Other Ambulatory Visit: Payer: Self-pay | Admitting: Internal Medicine

## 2015-03-30 ENCOUNTER — Ambulatory Visit: Payer: Managed Care, Other (non HMO) | Admitting: Internal Medicine

## 2015-04-25 ENCOUNTER — Telehealth: Payer: Self-pay | Admitting: Internal Medicine

## 2015-04-25 NOTE — Telephone Encounter (Signed)
Called pt to schedule a follow up appt in Feb-March..Marland Kitchen

## 2015-07-06 ENCOUNTER — Telehealth: Payer: Self-pay | Admitting: Internal Medicine

## 2015-07-21 NOTE — Progress Notes (Signed)
 Epilepsy Monitoring Service  Progress Note    Patient ID: Jennifer [name] is a 60 y.o. female with spells of transient neurologic symptoms who is being admitted to the epilepsy monitoring unit for spell characterization.    Interval History:  No events overnight.    Patient endorsing intermittent memory difficulty but no other sx this am.    Allergies:  No Known Allergies    Inpatient Medications:  Scheduled Meds:  . aspirin   325 mg Oral Daily   . enoxaparin  (LOVENOX ) injection  40 mg Subcutaneous Daily   . levothyroxine   50 mcg Oral Daily   . lidocaine  injection 1%  0.5 mL Subcutaneous As Directed Admin   . [START ON 07/23/2015] metoprolol  succinate  25 mg Oral QSS   . metoprolol  succinate  25 mg Oral QHSTT   . metoprolol  succinate  50 mg Oral QHSMWF   . omega-3 fatty acids/fish oil  1 capsule Oral BID   . polyethylene glycol  17 g Oral QHS   . rosuvastatin   5 mg Oral Daily   . sodium chloride  0.9% flush  5 mL Intravenous Q8H       Continuous Infusions:       PRN Meds:  acetaminophen , diphenhydrAMINE , ondansetron  (PF)    Physical Exam:  Temp:  [36.5 C (97.7 F)-36.6 C (97.9 F)] 36.6 C (97.9 F)  Heart Rate:  [49-95] 65  Resp:  [15-17] 17  BP: (124-147)/(56-73) 124/56  Temp (24hrs), Avg:36.6 C (97.8 F), Min:36.5 C (97.7 F), Max:36.6 C (97.9 F)    SpO2: 96 %       Gen: Well appearing and not in any distress.  HEENT: Normocephalic. Atraumatic. Oropharynx clear and moist.  Pulm: Normal work of breathing.  CV: Regular rate and rhythm.  GI: Soft and non-tender. Normal bowel sounds.  MSK: Extremities warm and well perfused without edema.    Neurologic Exam:   - MS: Awake, alert, and oriented to self, place, and time; appropriately interactive, normal affect.   - Speech: Fluent, not dysarthric.   - CN: Pupils 4 mm ERRL, VFF by confrontation.  EOMI without nystagmus; no ptosis.  Sensation intact to light touch.  Face symmetric without weakness.  Hearing to voice intact. Voice normal. SCM/trap adequate.  Tongue  protrudes midline.  - Motor: Normal bulk and tone.  Strength 5/5 in all major muscle groups.  - Sensory: Intact to light touch throughout.    - Coordination: FNF movements intact.   - Reflexes: 2+ and symmetric throughout.   - Gait: Deferred.     EEG:  - Prolonged EEG with video (2/20 - 2/21):     Interictal EEG: The occipital dominant rhythm was 10 Hz. This   activity is reactive to stimulaiton.  Drowsiness and sleep were   manifested by background fragmentation, vertex waves,   K-complexes, and sleep spindles. There was no focal slowing.   There were no interictal epileptiform discharges. There were no   electrographic seizures identified. Photic stimulation was not   performed. Hyperventilation was not performed.     Events: None yet.    Summary/Assessment:  During the course of this hospitalization, the interictal EEG   showed: a normal background. There were no events captured yet.    Assessment:  Jennifer [name] is a 60 y.o. female with spells of transient neurologic symptoms who is being admitted to the epilepsy monitoring unit for spell characterization.    Plan:  - Continue long term video monitoring for spell characterization.  -  Adjust AEDs as follows:   - Hold Keppra  1500 mg BID   - Hold lacosamide  given no events (home dose 200 mg BID, was on 100 mg BID upon admission)  - Continue other home medications.  - Continuous family observation during the visit.  - Bedrest with bathroom privileges.  - PIV in place for emergent meds.  - Neuro checks per floor routine.  - Seizure precautions.    Prophylaxis: SCDs.  Diet: Regular.  Code Status: Full Code    Jennifer Terrea Mariner, MD  [phone] (EMU resident)  07/21/2015 6:57 AM    -------------------------------------------------------------------------------  Attestation signed by [name] Jennifer Luz, MD at 07/21/2015 11:14 AM  I personally saw and evaluated the patient, and participated in the management and treatment plan as documented in the  resident/fellow note.    Jennifer JONELLE EVELIA, MD      This is a 60 year old white female who was admitted for characterization of recurrent episodes of altered awareness and some episodes of generalized convulsions. She has had an increase in frequency of events.    She was admitted on 07/18/15 and medications have been aggressively tapered. She is now off of all AEDs. EEG remains normal and no clinical events.    We will continue VEEG with seizure precautions.  -------------------------------------------------------------------------------

## 2015-07-21 NOTE — Procedures (Signed)
 [facility]  Epilepsy Monitoring Unit -- Video EEG Report  Long Term Monitoring Interim Report  Monitoring Day 4    Dates of Study: 07/18/2015 - 07/22/2015  Type of Study: Phase I (scalp EEG) characterization.     Handedness:  Right    Seizure Risk Factors: None   Seizure Risk Factor Details: None  Seizure Onset: 53 years    Seizure Types:   1. Aura of rising feeling in her stomach going to mouth with mouth feeling funny 10-15 sec and some confusion lasting a couple minutes afterward. Now on average 2x per week.       2. 2 GTCSz in 2010.    Medications:  failed:Clonazepam   current: Levetiracetam  1500 mg BID and lacosamide  200 mg BID    Physical Exam: (as per admission H&P)  - MS: Awake, alert, and oriented to self, place, and time; appropriately interactive, normal affect.   - Speech: Fluent, not dysarthric.   - CN: Pupils 4 mm ERRL, VFF by confrontation. EOMI without nystagmus; no ptosis. Sensation intact to light touch. Face symmetric without weakness. Hearing to voice intact. Voice normal. SCM/trap adequate. Tongue protrudes midline.  - Motor: Normal bulk and tone. Strength 5/5 in all major muscle groups.  - Sensory: Intact to light touch throughout.   - Coordination: FNF movements intact.   - Reflexes: 2+ and symmetric throughout.   - Gait: Deferred.     Previous Studies:   - CT Brain (02/19/10): normal  - CTA Brain (02/19/10): normal, no masses    Prior EEG Studies:   - Video EEG (2/8-07/06/09): normal no events    Neuropsychiatric Evaluation: none     Wada Test: none  ________________________________________________________________________  RECORDING DATES: 07/18/2015 - 07/22/2015  Technical Description:  This EEG was acquired with electrodes placed according to the International 10-20 electrode placement system. The following electrodes were missing, displaced or added: none.      The patient and family were instructed to activate the push-button for any events.  The EEG was reviewed for all events.   The entire sleep period and randomly selected samples of the remainder of the tracing were reviewed.  Video and audio were reviewed for push-button events and other periods of interest.     Interictal EEG: The occipital dominant rhythm was 10 Hz. This activity is reactive to stimulaiton.   Drowsiness and sleep were manifested by background fragmentation, vertex waves, K-complexes, and sleep spindles. There was no focal slowing. There were no interictal epileptiform discharges. There were no electrographic seizures identified.  Photic stimulation was not performed. Hyperventilation was not performed.     Events:    Event #1  07/22/15 @ 0148    Clinical: she was sleeping and started moaning. She had arousal and woke up and was little confused.  EEG: no change     Summary/Assessment:  During the course of this hospitalization, the interictal EEG showed: a normal background. There were one events captured , and did not have any EEG correlate    Conclusion/Plan:   Based on the above clinical and EEG data: We will continue EEG monitoring to capture his typical spell.    Conference:  The patient may need to be discussed at epilepsy conference pending further monitoring.         Jennifer Tenny Olmsted, MD 07/22/2015 11:18 AM  Clinical Neurophysiology Fellow  [facility]   I have reviewed the results of this test and agree with the findings documented in the resident/fellow  note as above.    Jennifer JONELLE BUSS, MD    EMU Tech Information Sheet  Patient Name: Jennifer [name] MRN: [id number] Date Range: 07/21/2015-07/22/2015   Study #: [id number] Sex: female Age: 60 y.o. DOB: [birth year]   Study Type: LTMEEG   Acquired On: LTM8   Technologist: OLAM MOWER Referring Physician: Self   LISA MCFADDEN  [facility]

## 2015-07-21 NOTE — Progress Notes (Signed)
 Case Management  Assessment and Discharge Plan    Patient Information:  NOVALEIGH KOHLMAN  DOB:  1956-05-17  Age:    60 y.o.  MRN:  [id number]  CSN:  [id number]    Admission Information  07/18/2015  1:20 PM  Seizures (CMS-HCC) [R56.9]  Saurabh Signe Buss, MD  Saurabh Signe Buss, MD  [id number]    Assessments  High Risk Assessment  Information Reviewed: MURVIN - MD, Progress notes  Physiologic Status: No issues identified  Psychosocial Status: No issues identified  Social Support System Network: No issues identified  Prior Resources Utilized with Past 6 Months: None  Funding Status: Commercial coverage  Activities of Daily Living (ADLs): Independent in ADLs, Independent in IADLs  Patient's Preference for Post Discharge Care: Home - self care  Known Discharge Needs: Post [place] physician followup  Initial Screening Results: No high risk discharge needs have been identified. Patient/Family in agreement with initial findings. Will continue to follow with healthcare team.       Concurrent Assessment  NAD: 07/28/15               Discharge Planning Resource Information:        Case Management high risk screening completed. No skilled discharge needs identified. Will continue to follow with care team.

## 2015-07-23 NOTE — Care Plan (Signed)
 Problem: Falls Prevention  Goal: Pt will remain free of falls  Outcome: Progressing  No falls  Goal: Pt/family/caregiver demonstrates understanding of falls prevention  Outcome: Progressing  Using call button    Problem: Risk for Injury:  Goal: Verbalization of measures to protect self from injury will improve.  Outcome: Progressing    Problem: Inadequate Airway Clearance:  Goal: Ability to maintain adequate ventilation will be supported.  Outcome: Progressing    Problem: Knowledge deficit:  Goal: Patient, family or caregiver knowledge of disease or condition will improve.  Outcome: Progressing

## 2015-07-27 NOTE — Progress Notes (Signed)
 [facility]: Post-Discharge Outcomes Assessment     Successful contact.    See additional documentation [facility] POSTDC link below for details.

## 2015-08-18 NOTE — Progress Notes (Signed)
 ADULT EPILEPSY FOLLOW UP  Chief Complaint: Hospital Follow Up (07/24/2015 seizures)    EPILEPSY PATIENT SUMMARY:  Initial Evaluation: 03/31/08  Handedness: Right  Age of Onset of Seizures: 61 years of age  Risk Factors for Seizures: none  Injuries with Seizures: None  History of Status Epilepticus: no    Seizure Types (and frequency):    1. Aura of rising feeling in her stomach going to mouth with mouth feeling funny 10-15 sec    2. [id number]     Other Medical Problems:  1. Migraine  2. Adrenal mass  3. HTN  4. Hypothyroidism  5. L hip replacement    Previous AED:  Name: Lacosamide , Levetiracetams and Oxcarbazepine  Current AED:  Name: Clonazepam , Lacosamide  and Levetiracetams    Imaging Studies:    CT Brain (02/19/10): normal    CTA Brain (02/19/10): normal, no masses    EEG:  Video EEG (2/8-07/06/09): normal no events   (2/20-2/26/17): normal, no definite events    Interval History:   She returns for follow-up with her husband.  She had a nondiagnostic video EEG in February.  We continue tapering her medications down.  She feels much better on the dose of the medicine she was on in the past.  As a reminder, in December she had an illness and some unusual episodes for which her medication dosages were increased.  When I saw her in January clinic she appear toxic on medications have very high levels of both levetiracetam  and lacosamide .  Since reducing medications to her baseline dosage she feels much better.    AED Side Effects/Complications: None     Current Therapy:   Medications:  Levetiracetam  1125/1500mg ; Vimpat  200mg  BID; Clonazepam  0.5 qHS    Complete Medication List, Past Medical History, Family History and Social History reviewed as documented in electronic record.    Allergies: No Known Allergies    Review of Systems: only as noted in HPI, otherwise not done    Examination:  Vitals:    08/18/15 1131   BP: 118/81   Pulse: 80   Temp: 36.6 C (97.9 F)     The entire visit today , 15 minutes, was spent in discussion  and counseling.    Pertinent Data:   Office Visit on 06/27/2015   Component Date Value   . Lacosamide  06/27/2015 18.4*   . Levetiracetam  (Keppra ), S 06/27/2015 83.3*     Assessment:   This is a patient with likely complex-partial seizures with rare secondary genearlization, from temporal region based on semiology. All testing is normal.  I believe all her recent problems were secondary to other illnesses leading to unnecessary increases in dosage of her seizure medications and subsequent toxicity.  She is now much better.  She has not had any definite seizures.  We will continue with present levels.    Her husband wonders about lowering the dose further.  I do not think this is reasonable in light of her prior history.    Impression:   Partial symptomatic epilepsy with complex partial seizures, not intractable, without status epilepticus (CMS-HCC) - Plan: levETIRAcetam  (KEPPRA ) 750 MG tablet, clonazePAM  (KLONOPIN ) 0.5 MG tablet    Plan:   Patient Instructions   1. Cotninue Levetiracetam  1125mg  in AM, 1500mg  in PM  2. Continue Vimpat  to 200mg  2x/day  3. Continue clonazepam  prn  4. Follow-up in 6 months    I personally performed the service.  (TP)    SKIPPY JONELLE BUSS, MD

## 2015-09-11 ENCOUNTER — Other Ambulatory Visit: Payer: Self-pay | Admitting: Internal Medicine

## 2015-10-07 ENCOUNTER — Other Ambulatory Visit: Payer: Self-pay | Admitting: Internal Medicine

## 2015-10-07 NOTE — Telephone Encounter (Signed)
Pt needs appt scheduled prior to refilling medication. I left her a message to call the office today to setup appt. Will then send enough until appt.

## 2015-10-10 NOTE — Telephone Encounter (Signed)
Ok to refill until appt Thanks 

## 2015-10-13 NOTE — Telephone Encounter (Signed)
I do not want her to run out of this medication, so if we can contact her and make a f/u appt and then refill until appt.  Thanks.

## 2015-10-17 NOTE — Telephone Encounter (Signed)
[place] so is sorry that this was sent to us, thanks

## 2015-12-12 ENCOUNTER — Other Ambulatory Visit: Payer: Self-pay | Admitting: Internal Medicine

## 2016-03-14 ENCOUNTER — Other Ambulatory Visit: Payer: Self-pay | Admitting: Internal Medicine

## 2016-03-14 NOTE — Telephone Encounter (Signed)
My understanding, pt moved.  Need to clarify with pt.  If so, need to get new md to refill medication.

## 2016-03-14 NOTE — Telephone Encounter (Signed)
Patient has not been seen since 11/24/14

## 2016-03-27 ENCOUNTER — Telehealth: Payer: Self-pay | Admitting: Internal Medicine

## 2016-03-27 NOTE — Telephone Encounter (Signed)
Pt wanted to inform that she is no longer a pt of Dr Scott. Thank you! °

## 2016-03-27 NOTE — Telephone Encounter (Signed)
Pt moved to [place]. °

## 2016-03-27 NOTE — Telephone Encounter (Signed)
fYI  

## 2016-05-28 DIAGNOSIS — K259 Gastric ulcer, unspecified as acute or chronic, without hemorrhage or perforation: Secondary | ICD-10-CM

## 2016-05-28 DIAGNOSIS — I499 Cardiac arrhythmia, unspecified: Secondary | ICD-10-CM

## 2016-05-28 DIAGNOSIS — I824Z9 Acute embolism and thrombosis of unspecified deep veins of unspecified distal lower extremity: Secondary | ICD-10-CM

## 2016-05-28 DIAGNOSIS — Z5189 Encounter for other specified aftercare: Secondary | ICD-10-CM

## 2016-05-28 HISTORY — PX: CHOLECYSTECTOMY: SHX55

## 2016-05-28 HISTORY — DX: Cardiac arrhythmia, unspecified: I49.9

## 2016-05-28 HISTORY — DX: Encounter for other specified aftercare: Z51.89

## 2016-05-28 HISTORY — DX: Gastric ulcer, unspecified as acute or chronic, without hemorrhage or perforation: K25.9

## 2016-05-28 HISTORY — DX: Acute embolism and thrombosis of unspecified deep veins of unspecified distal lower extremity: I82.4Z9

## 2016-05-28 HISTORY — PX: CARDIAC ABLATION: SHX51081

## 2016-05-28 HISTORY — PX: OTHER SURGICAL HISTORY: SHX169

## 2017-04-11 NOTE — Progress Notes (Signed)
 Subjective:     Patient ID: Jennifer [name] is a 61 y.o. female.    HPI     Abdominal Pain     This problem has existed for  3 days.  Location Left Lower Quadrant.  The level of severity  moderate.  Onset was  sudden.  Since onset, the problem has been  gradually worsening.  The problem is  new.  Symptoms are worsened by  eating.  Associated symptoms include  decreased appetite, nausea and diarrhea.  Negative for abdominal distention/bloating, chest pain, blood in stool, fever, altered mental status, severe pain, constipation, rectal spasms, rectal bulge, dizziness, fatigue, headache, shortness of breath, diaphoresis, vomiting, unintentional weight loss, back pain, change in bowel habits, rectal bleeding and irregular menses.              Comments     Has a history of diverticulosis, diverticulitis.  Started having a flare 2 days ago.  No blood in stool.  Diarrhea x 1 yesterday.            Last edited by Rojelio Miser, NP on 04/11/2017  9:26 AM. (History)            ROS     Positive for: Respiratory    Negative for: Constitutional    Last edited by Rojelio Miser, NP on 04/11/2017  9:26 AM. (History)              Flu Offering:  Flu Offer Have you had your flu shot this season (August 2018-March 2019)?: Yes      Social History     Tobacco Use   Smoking Status Never Smoker   Smokeless Tobacco Never Used     Past Medical History:   Diagnosis Date   . Hyperlipidemia    . Hypothyroid    . Migraine headache    . Muscle strain 2016   . Perforated sigmoid colon    . Seizures      Past Surgical History:   Procedure Laterality Date   . COLON SURGERY     . COLONOSCOPY      subsequent perforation   . HIP ARTHROPLASTY Bilateral    . KNEE ARTHROPLASTY     . LAMINECTOMY       Family History   Problem Relation Age of Onset   . Lung cancer Mother         no history tobacco use   . Heart disease Father    . Hypertension Neg Hx    . Hyperlipidemia Neg Hx    . Diabetes Neg Hx          Objective:    Physical Exam   Constitutional:  She is oriented to person, place, and time. She appears well-developed and well-nourished.   Cardiovascular: Normal rate, regular rhythm and normal heart sounds. Exam reveals no gallop and no friction rub.   No murmur heard.  Pulmonary/Chest: Effort normal and breath sounds normal.   Abdominal: Soft. Bowel sounds are normal. She exhibits no distension and no mass. There is tenderness. There is no rebound and no guarding.   Neurological: She is alert and oriented to person, place, and time.   Skin: Skin is warm and dry.              Assessment/Plan:                      Clinical impression consistent with history of diverticulitis.  Will initiate care and Ms.  Cashatt will follow with her PCP for same.  She is encouraged to report to the ER if any change in bowel habits, blood in stool, vomiting blood, fever, increase in abdominal pain or any signs that concern her.  She voices understanding.

## 2019-03-19 NOTE — Telephone Encounter (Signed)
Error

## 2019-11-29 NOTE — ED Provider Notes (Signed)
 [facility]  [facility] EMERGENCY DEPARTMENT  [address]  [place] [address]  Dept: [phone]  Loc Appt: [phone]  Loc: [phone]       Diagnosis   (M25.559) Hip pain    (L53.9) Erythema of skin      MDM / DDX / ED Course     Assessment/Differential Diagnosis: Cellulitis, less likely fracture, wound/bug bite, localized reaction    Medical Decision Making/ED Course:   Jennifer [name] is a 64 y.o. female who p/w cc area of redness and pain to rt side of hip for past 1 day .        Will order xray hip.   Will administer clindamycin.    Pt expresses understanding and is agreeable to plan.    Nursing notes were reviewed.    ED Course     ED Course as of Nov 29 1443   Sun Nov 29, 2019   1342 BP: 105/59 [MK]   1342 Heart Rate: 73 [MK]   1342 Temp: 97.9 F (36.6 C) [MK]   1342 Resp: 16 [MK]   1342 SpO2: 97 % [MK]   1431 X-RAY EXAM HIP UNILAT, 2-3 VIEWS, RT [MK]   1432   IMPRESSION No acute findings.        [MK]     Discussed x-ray results with patient indicating no underlying bony pathology.  Area of erythema was marked with a surgical marker.  Discussed with patient that we will treat empirically for cellulitis but to monitor the area and to return to the ED if the size increases or if there is increased pain.  Discussed all labs, imaging with pt/family who acknowledges understanding. Will d/c home with clindamycin. Instructed pt to followup with PCP  for reexamination in 1-2 days. Return to ED precautions discussed. Pt/family agrees to plan.    Patient is stable after examination in ED.  No significant concern on labs or imaging to warrant further testing.   At discharge, pt looked well, nontoxic, no distress, and is good candidate for outpatient follow up. No signs of toxicity to suggest need for further labs, imaging or for admission.   Results discussed w/ patient/family. All questions were answered.    Procedures / Consults   Case  discussed with Dr Lovely, ED attending physician, who agrees with management & plan.      Disposition   Discharge      HPI   Jennifer [name] is a 64 y.o. female who presents with hx arthropathy, DVT, cc area of pain along the anterior,superior portion of her rt hip; worse with sitting upright, lying on the right side and putting pressure on the hip area since yesterday. Pt states she came out of the shower to look and noticed it was red. Denies any hx of trauma however, husband at bedside states while they were on vacation this past week he seemed to remember that pt hit her rt hip against something. Pt denies any falls or other injuries. Reports hx of rt hip surgery more than 15-20 yrs ago and had an area of infection at that same area a week after her surgery, was worried about possible infection. Denies any bug bites, spider bites, hx of pruritus.Had chills last night, no fevers.        Patient History     Past Medical History:  Past Medical History:   Diagnosis Date   . Arthropathy    . COVID-19 vaccine series completed    .  DVT (deep venous thrombosis) (HCC)     left leg 2018   . Elevated cholesterol    . Epilepsy (HCC)    . Hypothyroidism    . Presence of IVC filter        Past Surgical History:   Past Surgical History:   Procedure Laterality Date   . CHOLECYSTECTOMY     . COLON RESECTION     . HIP REPLACEMENT      bil   . KNEE REPLACEMENT      right knee   . LUMBAR LAMINECTOMY         Family History:   No family history on file.    Social History:   Social History     Socioeconomic History   . Marital status: Married     Spouse name: Not on file   . Number of children: Not on file   . Years of education: Not on file   . Highest education level: Not on file   Occupational History   . Not on file   Tobacco Use   . Smoking status: Never Smoker   . Smokeless tobacco: Never Used   Substance and Sexual Activity   . Alcohol use: Not Currently   . Drug use: Never   . Sexual activity: Not on file   Other Topics  Concern   . Not on file   Social History Narrative   . Not on file     Social Determinants of Health     Financial Resource Strain:    . Difficulty of Paying Living Expenses:    Food Insecurity:    . Worried About Programme researcher, broadcasting/film/video in the Last Year:    . Barista in the Last Year:    Transportation Needs:    . Freight forwarder (Medical):    SABRA Lack of Transportation (Non-Medical):    Physical Activity:    . Days of Exercise per Week:    . Minutes of Exercise per Session:    Stress:    . Feeling of Stress :    Social Connections:    . Frequency of Communication with Friends and Family:    . Frequency of Social Gatherings with Friends and Family:    . Attends Religious Services:    . Active Member of Clubs or Organizations:    . Attends Banker Meetings:    SABRA Marital Status:    Intimate Partner Violence:    . Fear of Current or Ex-Partner:    . Emotionally Abused:    SABRA Physically Abused:    . Sexually Abused:        Home Medications:   Home Medication List - Marked as Reviewed on 11/29/19 1323   Medication Sig   aspirin  325 mg PO TABS Take 325 mg by Mouth Once a Day.   Clindamycin HCl 300 mg PO CAPS Take 1 Cap by Mouth Every 6 Hours for 10 days.   clonazePAM  (KLONOPIN ) 1 mg PO TABS Take 1 mg by Mouth 2 (two) times a day.   levetiracetam  (KEPPRA  PO) Take 1,500 mg by Mouth 2 (two) times a day.   levothyroxine  (SYNTHROID ) 88 mcg PO TABS Take 88 mcg by Mouth Once a Day.   rosuvastatin  calcium  (ROSUVASTATIN  PO) Take  by Mouth.       Allergies:   No Known Allergies       -Nursing notes reviewed by me.   -Past Medical/Surgical/Family/Social History reviewed  by me (as documented by RN notes)       PMD/Specialists   PMD:  Lemond JAYSON Henry, MD       ROS     Constitutional: Negative for fever, chills and fatigue.   HENT: Negative for ear pain, congestion, sore throat     Respiratory: Negative for cough, shortness of breath and wheezing.    Cardiovascular: Negative for chest pain.   Gastrointestinal: Negative  for nausea, vomiting, abdominal pain, diarrhea and blood in stool.   Genitourinary: Negative for dysuria, urgency, frequency, hematuria, flank pain and decreased urine volume.   Musculoskeletal: Negative for back pain. Rt hip pain, redness  Skin: Negative for rash.   Neurological: Negative for dizziness, light-headedness and headaches.        Physical Exam     Personal Protective Equipment:    Personal Protective Equipment was used including;  goggles, mask-surgical, mask-N95 and hands-gloves.  Patient was placed on precaution(s).  Patient was masked.    Vitals:    11/29/19 1316   BP: 105/59   Resp: 16   Temp: 97.9 F (36.6 C)   SpO2: 97%   Weight: 101.2 kg (223 lb)   Height: 5' 3 (1.6 m)     Pulse Oximetry Analysis -  Normal   Cardiac Monitor Analysis (interpreted by me) -     Vital Signs Reviewed  Constitutional: Well-developed, well-nourished, and in no distress.    HENT:   Head: Normocephalic and atraumatic.   Right Ear: External ear normal.   Left Ear: External ear normal.   Nose: Nose normal.   Mouth/Throat: Oropharynx is clear and moist. .   Eyes: Conjunctivae are normal.   Neck: Normal range of motion.    Cardiovascular: Normal rate, regular rhythm, normal heart sounds.    Pulmonary/Chest: Effort normal and breath sounds normal. No respiratory distress. No wheezes. No rales.   Abdominal: Soft. Exhibits no distension and no mass. There is no tenderness.   Genitourinary: N/A   Musculoskeletal: Normal range of motion. Rt anterior/lateral below inguinal crease with area of erythema approx 4cm x3cm in size, mild warmth, no induration. No obvious puncture wound or bug bite. Slight pain reproduced with flexion, no ttp along lateral hip near acetabulum or ASIS region. No calf pain with palpation  Lymphadenopathy:  N/A    Neurological: Alert. Normal motor skills and normal strength.  Coordination and gait normal.   Skin: No rash noted.         Haiku has been used to photograph an image of this patient. The patient  was made aware that this image will be directly loaded into their patient chart and will not be saved on my smart phone. The patient expresses understanding and consents to having their image taken and placed into their chart.     EKG (interpreted by me)   No results found for this visit on 11/29/19.    EKG interpreted and confirmed by: SABRA    Critical Care Time   None      Robyne FABIENE Cramp, MPA, PA-C  Emergency Department   11/29/2019, 2:45 PM  ___________________________    Medications  (ED/RX)     Meds administered in ER this visit:  Medications   clindamycin (CLEOCIN) capsule 300 mg (has no administration in time range)       New prescriptions given to patient:  New Prescriptions    CLINDAMYCIN HCL 300 MG PO CAPS    Take 1 Cap by Mouth Every 6 Hours  for 10 days.              Labs     No results found for this visit on 11/29/19.           Radiology     X-RAY EXAM HIP UNILAT, 2-3 VIEWS, RT   Final Result    No acute findings.         Reading location: [facility]      Signed By: Deward Alf, MD on 11/29/2019 2:22 PM                    Vital Signs this ED Visit     Patient Vitals for the past 24 hrs:   Temp Heart Rate Resp BP BP Mean SpO2 Weight   11/29/19 1424 98.5 F (36.9 C) 55 16 (!) 103/34 57 MM HG 97 % --   11/29/19 1316 97.9 F (36.6 C) 73 16 105/59 74 MM HG 97 % 101.2 kg (223 lb)                Robyne FABIENE Cramp, MPA, PA-C  Emergency Department

## 2020-01-25 NOTE — Telephone Encounter (Signed)
 I sent in mobic    She will need to be seen this week in the office to be sure her bmi is under 40 or we cannot proceed with her surgery

## 2020-02-04 ENCOUNTER — Ambulatory Visit (HOSPITAL_BASED_OUTPATIENT_CLINIC_OR_DEPARTMENT_OTHER)
Admission: RE | Admit: 2020-02-04 | Discharge: 2020-02-04 | Disposition: A | Discharge status: Home / Self Care | Payer: Medicare Other | Source: Ambulatory Visit | Attending: Orthopaedic Surgery | Admitting: Orthopaedic Surgery

## 2020-02-04 ENCOUNTER — Other Ambulatory Visit: Payer: Self-pay | Admitting: Orthopaedic Surgery

## 2020-02-04 ENCOUNTER — Ambulatory Visit
Admission: RE | Admit: 2020-02-04 | Discharge: 2020-02-04 | Disposition: A | Discharge status: Home / Self Care | Payer: Medicare Other | Source: Ambulatory Visit | Attending: Orthopaedic Surgery | Admitting: Orthopaedic Surgery

## 2020-02-04 DIAGNOSIS — M1712 Unilateral primary osteoarthritis, left knee: Secondary | ICD-10-CM | POA: Insufficient documentation

## 2020-02-04 DIAGNOSIS — Z01812 Encounter for preprocedural laboratory examination: Secondary | ICD-10-CM | POA: Insufficient documentation

## 2020-02-04 DIAGNOSIS — Z79899 Other long term (current) drug therapy: Secondary | ICD-10-CM | POA: Insufficient documentation

## 2020-02-04 DIAGNOSIS — Z01818 Encounter for other preprocedural examination: Secondary | ICD-10-CM | POA: Insufficient documentation

## 2020-02-04 DIAGNOSIS — R9431 Abnormal electrocardiogram [ECG] [EKG]: Secondary | ICD-10-CM

## 2020-02-04 LAB — HEMOGLOBIN A1C
Average Estimated Glucose: 102.5 mg/dL
Hemoglobin A1C: 5.2 % (ref 4.6–5.9)

## 2020-02-04 LAB — CBC AND DIFFERENTIAL
Absolute NRBC: 0 10*3/uL (ref 0.00–0.00)
Basophils Absolute Automated: 0.04 10*3/uL (ref 0.00–0.08)
Basophils Automated: 0.6 %
Eosinophils Absolute Automated: 0.44 10*3/uL (ref 0.00–0.44)
Eosinophils Automated: 6.6 %
Hematocrit: 39.4 % (ref 34.7–43.7)
Hgb: 13.3 g/dL (ref 11.4–14.8)
Immature Granulocytes Absolute: 0.02 10*3/uL (ref 0.00–0.07)
Immature Granulocytes: 0.3 %
Lymphocytes Absolute Automated: 1.69 10*3/uL (ref 0.42–3.22)
Lymphocytes Automated: 25.3 %
MCH: 31.3 pg (ref 25.1–33.5)
MCHC: 33.8 g/dL (ref 31.5–35.8)
MCV: 92.7 fL (ref 78.0–96.0)
MPV: 11.7 fL (ref 8.9–12.5)
Monocytes Absolute Automated: 0.49 10*3/uL (ref 0.21–0.85)
Monocytes: 7.3 %
Neutrophils Absolute: 4.01 10*3/uL (ref 1.10–6.33)
Neutrophils: 59.9 %
Nucleated RBC: 0 /100 WBC (ref 0.0–0.0)
Platelets: 272 10*3/uL (ref 142–346)
RBC: 4.25 10*6/uL (ref 3.90–5.10)
RDW: 13 % (ref 11–15)
WBC: 6.69 10*3/uL (ref 3.10–9.50)

## 2020-02-04 LAB — URINALYSIS, REFLEX TO MICROSCOPIC EXAM IF INDICATED
Bilirubin, UA: NEGATIVE
Blood, UA: NEGATIVE
Glucose, UA: NEGATIVE
Ketones UA: NEGATIVE
Leukocyte Esterase, UA: NEGATIVE
Nitrite, UA: NEGATIVE
Protein, UR: NEGATIVE
Specific Gravity UA: 1.009 (ref 1.001–1.035)
Urine Bacteria: NONE SEEN /hpf
Urine pH: 7 (ref 5.0–8.0)
Urobilinogen, UA: 0.2 (ref 0.2–2.0)

## 2020-02-04 LAB — BASIC METABOLIC PANEL
Anion Gap: 10 (ref 5.0–15.0)
BUN: 23 mg/dL — ABNORMAL HIGH (ref 7.0–19.0)
CO2: 30 mEq/L — ABNORMAL HIGH (ref 21–29)
Calcium: 9.3 mg/dL (ref 8.5–10.5)
Chloride: 104 mEq/L (ref 100–111)
Creatinine: 1 mg/dL (ref 0.4–1.5)
Glucose: 112 mg/dL — ABNORMAL HIGH (ref 70–100)
Potassium: 3.8 mEq/L (ref 3.5–5.1)
Sodium: 144 mEq/L (ref 136–145)

## 2020-02-04 LAB — HEMOLYSIS INDEX: Hemolysis Index: 5 (ref 0–18)

## 2020-02-04 LAB — GFR: EGFR: 55.6

## 2020-02-05 LAB — ECG 12-LEAD
Atrial Rate: 61 {beats}/min
P Axis: 46 degrees
P-R Interval: 160 ms
Q-T Interval: 430 ms
QRS Duration: 96 ms
QTC Calculation (Bezet): 432 ms
R Axis: -37 degrees
T Axis: 40 degrees
Ventricular Rate: 61 {beats}/min

## 2020-02-05 LAB — PRESURGICAL SURVEILLANCE, MSSA+MRSA

## 2020-02-09 ENCOUNTER — Telehealth: Payer: Self-pay

## 2020-02-09 ENCOUNTER — Encounter: Payer: Self-pay | Admitting: Orthopaedic Surgery

## 2020-02-09 NOTE — Pre-Procedure Instructions (Signed)
PSS Joint email to [email or web address]    Has walker.  Husband is Psychologist, occupational.

## 2020-02-11 ENCOUNTER — Encounter: Payer: Self-pay | Admitting: Orthopaedic Surgery

## 2020-02-11 NOTE — Progress Notes (Signed)
Pre-op education provided to patient (coach not present) related to total joint replacement surgery. Pre-op requirements, post-op goals, pain management expectations and surgical site infection prevention discussed. Link given for Joint Replacement Patient Education video. Questions and concerns addressed. Patient verbalized understanding of instructions. Call back number given for further questions.    Epes plan: home* with [facility] PT (referral to [facility] per Dr. Henderson Newcomer office)  Equipment at home: cane, FWW  Current need for adaptive equipment? Cane at times  Current activity level: moderate  Coach: family    *Patient plans for overnight admission.    Patient has had multiple joint replacements, is very familiar with the process.

## 2020-02-11 NOTE — H&P (Signed)
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  Total left knee replacement surgery has been scheduled with Dr. Gillermina Phy for 02/12/20 at [facility].      Patient has 0.4% RCRI in history and adequate functional capacity (METS 5), Stop-Bang score of 2 and is ASA class 2 with elevated BMI of 38.26.  Based on ACC/AHA 2014 guidelines, no additional CV testing is needed.  Patient is at acceptable risk to proceed with planned procedure.    Pre op evaluation:     No cardiac symptoms     Functional capacity is 5 Mets     Seizure DO:  Optimized on keppra, vimpat, clonazepam daily.  Last seizure 2010.  Followed by Dr. Gaynelle Arabian.     DVT:  Left leg after cholecystectomy, IVC filter placement 2018.     HTN:  Optimized on dyazide.     Hypothyroidism:  Optimized on synthroid daily.     Exercise:  Stationary bike.  No cardiopulmonary symptoms with exercise.     Pt reports urinary retention, and spinal not working in the past with surgery.    Patient denies any exertional chest pain, shortness of breath, palpitations, dizziness, lightheadedness, syncope and claudication.  She has no cardiopulmonary symptoms.  She is afebrile.    Patients medical and surgical history, medication reconciliation and family history were reviewed as well as Jennifer [name] complete physical exam was performed.  Recent laboratory results were obtained.  EKG was done 02/04/20 and is NSR with SA with ventricular rate of 61.      Date Time: 02/11/20  10:45 AM  Plan:     Impression/Plan:  -Pre-op evaluation  ok to proceed with intermediate risk surgery.  -Hold ASA, vitamins, supplements, NSAIDS, etc. 7-10 days prior to procedure.  -Hibiclens and instructions on proper use given  to pt.  -May have up to 8 oz of clear liquids up to 4 hours prior to surgery.  No solid food after midnight.  -Take clonazepam, vimpat, keppra, synthroid, rosuvastatin DOS.  -Hold Dyazide DOS.  -Avoid cuts/scrapes to body prior to surgery  -Discussed importance of early and often ambulating, leg and foot exercises while in bed or on cough to prevent DVT.    History of Present Illness:     I had the pleasure of seeing Jennifer Jennifer [name] today for preoperative evaluation.  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 Gillermina Phy.  Her left knee is worsening and is affecting her quality of life.  Patient has had bilateral hip replacements and her right knee replaced in 2015 with good result.  She reports 2 years of left knee pain, which became worse January 2021 and she sought medical attention May 2021.  Her pain was interfering with her ADLs.  She will now have surgery for her OA.  She has pain to medial aspect of left knee, which radiates down her leg occasionally.  She describes popping, burning pain with position changes, stairs and walking.  She is no longer able to bike as much due to pain.  Her pain  level is 7-8/10 on the pain scale.  She is ambulating with Jennifer [name] cane.  She did well with prior joint replacements but had urinary retention issues requiring straight cath and had limited pain control with spinal anesthesia.  She indicated that non-surgical treatments failed to alleviate her pain and so unable to perform her usual activities of daily living and therefore will have it surgically repaired.    She denies any exertional discomfort to suggest angina.  She is recently very sedentary and very limited by knee pain.      Risk factors; no history of surgical complications, no easy bleeding tendency, no history of allergic reaction to anesthetic agents.    Current symptoms:  no unusual  fatigue, normal appetite, no fever, no chest pain, no palpitations, no unexplained syncope, no shortness of breath, no reduction in exercise tolerance due to chest pain or dyspnea, no upper respiratory infection, no wheezing, no atypical bleeding, normal bowel function, no dysuria/urinary symptoms.    Procedures:     Procedure(s):  LEFT ARTHROPLASTY, KNEE, TOTAL      Date of Procedure:   02/12/20    Past Medical History:     Past Medical History:   Diagnosis Date    Abnormal vision     glasses    Arrhythmia 2018    SVT (had ablation)    Arthritis     Complication of anesthesia     urinary retention with every joint replacement, BP drop with spinal X1, another time spinal didn't work    Convulsions     epilepsy, last seizure 2010    Deep venous thrombosis of distal lower extremity 2018    after cholecystectomy; left leg    Encounter for blood transfusion 2018    Gastric ulceration 2018    GI bleed with requiring 4 units PRBCs per pt    Hypothyroidism        Past Surgical History:     Past Surgical History:   Procedure Laterality Date    ADENOIDECTOMY      BACK SURGERY  1973    lumbar    CARDIAC ABLATION  2018    SVT    CHOLECYSTECTOMY  2018    COLON SURGERY  2008    resection, perforated colon with colonoscopy    COLONOSCOPY      JOINT REPLACEMENT      bilat hips, L 2005, R 2010, right knee 2015    OTHER SURGICAL HISTORY  2018    IVC filter    TONSILLECTOMY         Family History:   History reviewed. No pertinent family history.    Social History:     Social History     Socioeconomic History    Marital status: Married     Spouse name: Not on file    Number of children: Not on file    Years of education: Not on file    Highest education level: Not on file   Occupational History    Not on file   Tobacco Use    Smoking status: Never Smoker    Smokeless tobacco: Never Used   Vaping Use    Vaping Use: Never used   Substance and Sexual Activity    Alcohol use: Yes     Comment: rarely    Drug use:  Never    Sexual activity: Not on file   Other Topics Concern    Not on file   Social History Narrative  Not on file     Social Determinants of Health     Financial Resource Strain:     Difficulty of Paying Living Expenses:    Food Insecurity:     Worried About Programme researcher, broadcasting/film/video in the Last Year:     Barista in the Last Year:    Transportation Needs:     Freight forwarder (Medical):     Lack of Transportation (Non-Medical):    Physical Activity:     Days of Exercise per Week:     Minutes of Exercise per Session:    Stress:     Feeling of Stress :    Social Connections:     Frequency of Communication with Friends and Family:     Frequency of Social Gatherings with Friends and Family:     Attends Religious Services:     Active Member of Clubs or Organizations:     Attends Banker Meetings:     Marital Status:    Intimate Partner Violence:     Fear of Current or Ex-Partner:     Emotionally Abused:     Physically Abused:     Sexually Abused:        Allergies:   No Known Allergies    Medications:   No current facility-administered medications for this encounter.    Current Outpatient Medications:     aspirin 325 MG tablet, Take 325 mg by mouth daily, Disp: , Rfl:     clonazePAM (KlonoPIN) 1 MG tablet, Take 1 mg by mouth 2 (two) times daily  , Disp: , Rfl:     Ergocalciferol (VITAMIN D2 PO), Take by mouth daily, Disp: , Rfl:     Lacosamide (Vimpat) 200 MG Tab, Take by mouth 2 (two) times daily, Disp: , Rfl:     levETIRAcetam (Keppra) 1000 MG tablet, Take 1,500 mg by mouth 2 (two) times daily, Disp: , Rfl:     levothyroxine (SYNTHROID) 88 MCG tablet, Take 88 mcg by mouth Once Jennifer [name] day at 6:00am, Disp: , Rfl:     Omega-3 Fatty Acids (Omega-3 Fish Oil) 500 MG Cap, Take by mouth daily, Disp: , Rfl:     rosuvastatin (CRESTOR) 5 MG tablet, Take 5 mg by mouth every morning, Disp: , Rfl:     triamterene-hydrochlorothiazide (DYAZIDE) 37.5-25 MG per capsule, Take 1 capsule by  mouth every morning, Disp: , Rfl:     Review of Systems:     Review of Systems   Constitutional: Negative.    HENT: Negative.    Eyes: Negative.    Respiratory: Negative.    Cardiovascular: Negative.    Gastrointestinal: Negative.    Genitourinary: Negative.    Musculoskeletal: Positive for falls and joint pain.        Left knee pain; fell down stairs no sustained injury 05/2019   Skin: Negative.    Neurological: Positive for seizures.        Last seizure 2010   Endo/Heme/Allergies: Negative.    Psychiatric/Behavioral: Negative.         Physical Exam:   Ht 1.6 m (5\' 3" )    Wt 98 kg (216 lb)    BMI 38.26 kg/m   Vitals:    02/11/20 1012   BP: 119/75   Pulse: (!) 59   Resp: 14   Temp: 97.2 F (36.2 C)   SpO2: 98%         Physical Exam  Constitutional:  General: She is not in acute distress.     Appearance: Normal appearance. She is obese. She is not ill-appearing, toxic-appearing or diaphoretic.   HENT:      Head: Normocephalic and atraumatic.      Right Ear: External ear normal.      Left Ear: External ear normal.      Nose: Nose normal.      Mouth/Throat:      Mouth: Mucous membranes are moist.      Pharynx: Oropharynx is clear.   Eyes:      Conjunctiva/sclera: Conjunctivae normal.   Cardiovascular:      Rate and Rhythm: Normal rate and regular rhythm.      Pulses: Normal pulses.      Heart sounds: Normal heart sounds. No murmur heard.     Pulmonary:      Effort: Pulmonary effort is normal.      Breath sounds: Normal breath sounds.   Abdominal:      Palpations: Abdomen is soft.   Genitourinary:     Comments: deferred  Musculoskeletal:      Cervical back: Normal range of motion and neck supple.      Right lower leg: No edema.      Left lower leg: No edema.      Comments: Left knee limited ROM   Skin:     General: Skin is warm and dry.      Capillary Refill: Capillary refill takes less than 2 seconds.   Neurological:      General: No focal deficit present.      Mental Status: She is alert and oriented to person,  place, and time.      Motor: No weakness.      Gait: Gait abnormal.      Comments: Ambulating with cane   Psychiatric:         Mood and Affect: Mood normal.         Behavior: Behavior normal.         Thought Content: Thought content normal.         Judgment: Judgment normal.         Most Recent Labs     Procedure Component Value Units Date/Time    Presurgical Surveillance, MSSA+MRSA [[id number]] Collected: 02/04/20 1135    Order Status: Completed Specimen: Throat - ASC Admission Updated: 02/05/20 1018     Culture Staph and MRSA Surveillance --     No Staph aureus isolated and  No Methicillin Resistant Staph aureus isolated      Urine culture [[id number]] Collected: 02/04/20 1044    Order Status: Completed Specimen: Urine, Clean Catch Updated: 02/07/20 1822    Narrative:      ORDER#: [id number]                                    ORDERED BY: Rosine Beat  SOURCE: Urine, Clean Catch                           COLLECTED:  02/04/20 10:44  ANTIBIOTICS AT COLL.:                                RECEIVED :  02/04/20 14:47  Culture Urine  FINAL       02/07/20 18:22   +  02/07/20   30,000 - 50,000 CFU/ML of normal urogenital or skin microbiota, no             further work      CBC and differential [[id number]] Collected: 02/04/20 1042    Order Status: Completed Updated: 02/04/20 1521     WBC 6.69 x10 3/uL      Hgb 13.3 g/dL      Hematocrit 40.9 %      Platelets 272 x10 3/uL      RBC 4.25 x10 6/uL      MCV 92.7 fL      MCH 31.3 pg      MCHC 33.8 g/dL      RDW 13 %      MPV 11.7 fL      Neutrophils 59.9 %      Lymphocytes Automated 25.3 %      Monocytes 7.3 %      Eosinophils Automated 6.6 %      Basophils Automated 0.6 %      Immature Granulocytes 0.3 %      Nucleated RBC 0.0 /100 WBC      Neutrophils Absolute 4.01 x10 3/uL      Lymphocytes Absolute Automated 1.69 x10 3/uL      Monocytes Absolute Automated 0.49 x10 3/uL      Eosinophils Absolute Automated 0.44 x10 3/uL      Basophils Absolute Automated  0.04 x10 3/uL      Immature Granulocytes Absolute 0.02 x10 3/uL      Absolute NRBC 0.00 x10 3/uL     Basic Metabolic Panel [[id number]]  (Abnormal) Collected: 02/04/20 1042    Order Status: Completed Updated: 02/04/20 1555     Glucose 112 mg/dL      BUN 95.6 mg/dL      Creatinine 1.0 mg/dL      Calcium 9.3 mg/dL      Sodium 213 mEq/L      Potassium 3.8 mEq/L      Chloride 104 mEq/L      CO2 30 mEq/L      Anion Gap 10.0    Presurgical Surveillance, MSSA+MRSA [[id number]] Collected: 02/04/20 1042    Order Status: Completed Specimen: Nasal Swab Updated: 02/05/20 1018     Culture Staph and MRSA Surveillance --     No Staph aureus isolated and  No Methicillin Resistant Staph aureus isolated            Aeric Burnham P Bama Hanselman  02/11/2020

## 2020-02-12 ENCOUNTER — Ambulatory Visit: Payer: Medicare Other | Admitting: Anesthesiology

## 2020-02-12 ENCOUNTER — Ambulatory Visit: Payer: Medicare Other | Admitting: Nurse Practitioner

## 2020-02-12 ENCOUNTER — Ambulatory Visit
Admission: RE | Admit: 2020-02-12 | Discharge: 2020-02-13 | Disposition: A | Discharge status: Home / Self Care | Payer: Medicare Other | Source: Ambulatory Visit | Attending: Orthopaedic Surgery | Admitting: Orthopaedic Surgery

## 2020-02-12 ENCOUNTER — Encounter
Admission: RE | Disposition: A | Discharge status: Home / Self Care | Payer: Self-pay | Source: Ambulatory Visit | Attending: Orthopaedic Surgery

## 2020-02-12 ENCOUNTER — Encounter: Payer: Self-pay | Admitting: Orthopaedic Surgery

## 2020-02-12 DIAGNOSIS — M1712 Unilateral primary osteoarthritis, left knee: Secondary | ICD-10-CM | POA: Insufficient documentation

## 2020-02-12 DIAGNOSIS — Z86718 Personal history of other venous thrombosis and embolism: Secondary | ICD-10-CM | POA: Insufficient documentation

## 2020-02-12 DIAGNOSIS — E039 Hypothyroidism, unspecified: Secondary | ICD-10-CM | POA: Insufficient documentation

## 2020-02-12 DIAGNOSIS — G40909 Epilepsy, unspecified, not intractable, without status epilepticus: Secondary | ICD-10-CM | POA: Insufficient documentation

## 2020-02-12 DIAGNOSIS — I10 Essential (primary) hypertension: Secondary | ICD-10-CM | POA: Insufficient documentation

## 2020-02-12 DIAGNOSIS — Z7982 Long term (current) use of aspirin: Secondary | ICD-10-CM | POA: Insufficient documentation

## 2020-02-12 HISTORY — DX: Unspecified convulsions: R56.9

## 2020-02-12 HISTORY — DX: Unspecified osteoarthritis, unspecified site: M19.90

## 2020-02-12 HISTORY — DX: Other complications of anesthesia, initial encounter: T88.59XA

## 2020-02-12 HISTORY — DX: Hypothyroidism, unspecified: E03.9

## 2020-02-12 HISTORY — DX: Unspecified visual disturbance: H53.9

## 2020-02-12 HISTORY — PX: ARTHROPLASTY, KNEE, TOTAL: SHX3134

## 2020-02-12 SURGERY — ARTHROPLASTY, KNEE, TOTAL
Anesthesia: Regional | Site: Knee | Laterality: Left | Wound class: Clean

## 2020-02-12 MED ORDER — BUPIVACAINE HCL (PF) 0.75 % IJ SOLN
INTRAMUSCULAR | Status: DC | PRN
Start: 2020-02-12 — End: 2020-02-12
  Administered 2020-02-12: 1.5 mL via INTRASPINAL

## 2020-02-12 MED ORDER — DEXTROSE 5 % IV SOLN
2.0000 g | INTRAVENOUS | Status: AC
Start: 2020-02-12 — End: 2020-02-12
  Administered 2020-02-12: 08:00:00 2 g via INTRAVENOUS

## 2020-02-12 MED ORDER — FENTANYL CITRATE (PF) 50 MCG/ML IJ SOLN (WRAP)
25.0000 ug | INTRAMUSCULAR | Status: AC | PRN
Start: 2020-02-12 — End: 2020-02-12
  Administered 2020-02-12 (×3): 25 ug via INTRAVENOUS

## 2020-02-12 MED ORDER — MORPHINE SULFATE (PF) 0.5 MG/ML IJ SOLN
INTRAMUSCULAR | Status: AC
Start: 2020-02-12 — End: ?
  Filled 2020-02-12: qty 30

## 2020-02-12 MED ORDER — HYDROCODONE-ACETAMINOPHEN 5-325 MG PO TABS
ORAL_TABLET | ORAL | Status: AC
Start: 2020-02-12 — End: 2020-02-12
  Administered 2020-02-12: 12:00:00 1 via ORAL
  Filled 2020-02-12: qty 1

## 2020-02-12 MED ORDER — ACETAMINOPHEN 325 MG PO TABS
325.0000 mg | ORAL_TABLET | Freq: Four times a day (QID) | ORAL | Status: DC
Start: 2020-02-12 — End: 2020-02-13
  Administered 2020-02-12 – 2020-02-13 (×3): 325 mg via ORAL
  Filled 2020-02-12 (×4): qty 1

## 2020-02-12 MED ORDER — TRAMADOL HCL 50 MG PO TABS
50.0000 mg | ORAL_TABLET | Freq: Four times a day (QID) | ORAL | Status: DC
Start: 2020-02-12 — End: 2020-02-13
  Administered 2020-02-12 – 2020-02-13 (×3): 50 mg via ORAL
  Filled 2020-02-12 (×4): qty 1

## 2020-02-12 MED ORDER — ROSUVASTATIN CALCIUM 10 MG PO TABS
5.0000 mg | ORAL_TABLET | Freq: Every morning | ORAL | Status: DC
Start: 2020-02-13 — End: 2020-02-13
  Administered 2020-02-13: 09:00:00 5 mg via ORAL
  Filled 2020-02-12 (×2): qty 1

## 2020-02-12 MED ORDER — HYDROMORPHONE HCL 1 MG/ML IJ SOLN
0.5000 mg | INTRAMUSCULAR | Status: DC | PRN
Start: 2020-02-12 — End: 2020-02-12
  Administered 2020-02-12: 0.5 mg via INTRAVENOUS

## 2020-02-12 MED ORDER — OXYCODONE HCL 5 MG PO TABS
5.0000 mg | ORAL_TABLET | ORAL | Status: DC | PRN
Start: 2020-02-12 — End: 2020-02-13

## 2020-02-12 MED ORDER — NALOXONE HCL 0.4 MG/ML IJ SOLN (WRAP)
0.4000 mg | INTRAMUSCULAR | Status: DC | PRN
Start: 2020-02-12 — End: 2020-02-13

## 2020-02-12 MED ORDER — EPHEDRINE SULFATE 50 MG/ML IJ/IV SOLN (WRAP)
Status: DC | PRN
Start: 2020-02-12 — End: 2020-02-12
  Administered 2020-02-12: 5 mg via INTRAVENOUS

## 2020-02-12 MED ORDER — FERROUS SULFATE 324 (65 FE) MG PO TBEC
DELAYED_RELEASE_TABLET | Freq: Every day | ORAL | Status: DC
Start: 2020-02-12 — End: 2020-02-13
  Filled 2020-02-12 (×2): qty 1

## 2020-02-12 MED ORDER — PROPOFOL INFUSION 10 MG/ML
INTRAVENOUS | Status: DC | PRN
Start: 2020-02-12 — End: 2020-02-12
  Administered 2020-02-12: 08:00:00 100 ug/kg/min via INTRAVENOUS

## 2020-02-12 MED ORDER — POLYETHYLENE GLYCOL 3350 17 G PO PACK
17.0000 g | PACK | Freq: Every day | ORAL | Status: DC
Start: 2020-02-12 — End: 2020-02-13
  Administered 2020-02-12 – 2020-02-13 (×2): 17 g via ORAL
  Filled 2020-02-12 (×2): qty 1

## 2020-02-12 MED ORDER — GLYCOPYRROLATE 0.2 MG/ML IJ SOLN (WRAP)
INTRAMUSCULAR | Status: AC
Start: 2020-02-12 — End: ?
  Filled 2020-02-12: qty 1

## 2020-02-12 MED ORDER — LACTATED RINGERS IV SOLN
INTRAVENOUS | Status: DC | PRN
Start: 2020-02-12 — End: 2020-02-13

## 2020-02-12 MED ORDER — PROPOFOL 10 MG/ML IV EMUL (WRAP)
INTRAVENOUS | Status: AC
Start: 2020-02-12 — End: ?
  Filled 2020-02-12: qty 20

## 2020-02-12 MED ORDER — HYDROMORPHONE HCL 1 MG/ML IJ SOLN
0.5000 mg | INTRAMUSCULAR | Status: DC | PRN
Start: 2020-02-12 — End: 2020-02-13
  Administered 2020-02-13: 0.5 mg via INTRAVENOUS
  Filled 2020-02-12: qty 1

## 2020-02-12 MED ORDER — TRANEXAMIC ACID-NACL 1000-0.7 MG/100ML-% IV SOLN
INTRAVENOUS | Status: AC
Start: 2020-02-12 — End: ?
  Filled 2020-02-12: qty 100

## 2020-02-12 MED ORDER — MORPHINE SULFATE (PF) 0.5 MG/ML IJ SOLN
INTRAMUSCULAR | Status: AC
Start: 2020-02-12 — End: ?
  Filled 2020-02-12: qty 10

## 2020-02-12 MED ORDER — HYDROCODONE-ACETAMINOPHEN 5-325 MG PO TABS
1.0000 | ORAL_TABLET | Freq: Once | ORAL | Status: AC | PRN
Start: 2020-02-12 — End: 2020-02-12

## 2020-02-12 MED ORDER — FENTANYL CITRATE (PF) 50 MCG/ML IJ SOLN (WRAP)
INTRAMUSCULAR | Status: AC
Start: 2020-02-12 — End: 2020-02-12
  Administered 2020-02-12: 10:00:00 25 ug via INTRAVENOUS
  Filled 2020-02-12: qty 2

## 2020-02-12 MED ORDER — ONDANSETRON HCL 4 MG/2ML IJ SOLN
INTRAMUSCULAR | Status: DC | PRN
Start: 2020-02-12 — End: 2020-02-12
  Administered 2020-02-12: 4 mg via INTRAVENOUS

## 2020-02-12 MED ORDER — BUPIVACAINE HCL (PF) 0.5 % IJ SOLN
INTRAMUSCULAR | Status: AC
Start: 2020-02-12 — End: ?
  Filled 2020-02-12: qty 120

## 2020-02-12 MED ORDER — PHENYLEPHRINE 100 MCG/ML IV BOLUS (ANESTHESIA)
PREFILLED_SYRINGE | INTRAVENOUS | Status: DC | PRN
Start: 2020-02-12 — End: 2020-02-12
  Administered 2020-02-12: 100 ug via INTRAVENOUS
  Administered 2020-02-12: 200 ug via INTRAVENOUS
  Administered 2020-02-12: 50 ug via INTRAVENOUS
  Administered 2020-02-12 (×5): 100 ug via INTRAVENOUS
  Administered 2020-02-12: 50 ug via INTRAVENOUS
  Administered 2020-02-12 (×2): 100 ug via INTRAVENOUS
  Administered 2020-02-12: 200 ug via INTRAVENOUS

## 2020-02-12 MED ORDER — ONDANSETRON 4 MG PO TBDP
4.0000 mg | ORAL_TABLET | Freq: Three times a day (TID) | ORAL | Status: DC | PRN
Start: 2020-02-12 — End: 2020-02-13

## 2020-02-12 MED ORDER — MIDAZOLAM HCL 1 MG/ML IJ SOLN (WRAP)
INTRAMUSCULAR | Status: DC | PRN
Start: 2020-02-12 — End: 2020-02-12
  Administered 2020-02-12: 2 mg via INTRAVENOUS

## 2020-02-12 MED ORDER — MIDAZOLAM HCL 1 MG/ML IJ SOLN (WRAP)
INTRAMUSCULAR | Status: AC
Start: 2020-02-12 — End: ?
  Filled 2020-02-12: qty 2

## 2020-02-12 MED ORDER — CEFAZOLIN SODIUM-DEXTROSE 2-5 GM/50ML-% IV SOLN
2.0000 g | Freq: Three times a day (TID) | INTRAVENOUS | Status: AC
Start: 2020-02-12 — End: 2020-02-13
  Administered 2020-02-12 – 2020-02-13 (×2): 2 g via INTRAVENOUS
  Filled 2020-02-12 (×2): qty 50

## 2020-02-12 MED ORDER — APIXABAN 2.5 MG PO TABS
2.5000 mg | ORAL_TABLET | Freq: Two times a day (BID) | ORAL | Status: DC
Start: 2020-02-12 — End: 2020-02-13
  Administered 2020-02-12 – 2020-02-13 (×2): 2.5 mg via ORAL
  Filled 2020-02-12 (×2): qty 1

## 2020-02-12 MED ORDER — LIDOCAINE HCL (PF) 2 % IJ SOLN
INTRAMUSCULAR | Status: AC
Start: 2020-02-12 — End: ?
  Filled 2020-02-12: qty 5

## 2020-02-12 MED ORDER — TRANEXAMIC ACID-NACL 1000-0.7 MG/100ML-% IV SOLN
1000.0000 mg | Freq: Once | INTRAVENOUS | Status: AC
Start: 2020-02-12 — End: 2020-02-12
  Administered 2020-02-12: 09:00:00 1000 mg via INTRAVENOUS

## 2020-02-12 MED ORDER — LACOSAMIDE 50 MG PO TABS
200.0000 mg | ORAL_TABLET | Freq: Two times a day (BID) | ORAL | Status: DC
Start: 2020-02-12 — End: 2020-02-13
  Administered 2020-02-12 – 2020-02-13 (×2): 200 mg via ORAL
  Filled 2020-02-12: qty 3
  Filled 2020-02-12 (×2): qty 4

## 2020-02-12 MED ORDER — CEFAZOLIN SODIUM 1 G IJ SOLR
INTRAMUSCULAR | Status: AC
Start: 2020-02-12 — End: ?
  Filled 2020-02-12: qty 2000

## 2020-02-12 MED ORDER — MORPHINE SULFATE (PF) 0.5 MG/ML IJ SOLN
INTRAMUSCULAR | Status: AC
Start: 2020-02-12 — End: ?
  Filled 2020-02-12: qty 20

## 2020-02-12 MED ORDER — GLYCOPYRROLATE 0.2 MG/ML IJ SOLN (WRAP)
INTRAMUSCULAR | Status: DC | PRN
Start: 2020-02-12 — End: 2020-02-12
  Administered 2020-02-12: .2 mg via INTRAVENOUS

## 2020-02-12 MED ORDER — BUPIVACAINE HCL (PF) 0.5 % IJ SOLN
INTRAMUSCULAR | Status: DC | PRN
Start: 2020-02-12 — End: 2020-02-12
  Administered 2020-02-12: 30 mL

## 2020-02-12 MED ORDER — PROPOFOL 10 MG/ML IV EMUL (WRAP)
INTRAVENOUS | Status: AC
Start: 2020-02-12 — End: ?
  Filled 2020-02-12: qty 50

## 2020-02-12 MED ORDER — ONDANSETRON HCL 4 MG/2ML IJ SOLN
4.0000 mg | Freq: Three times a day (TID) | INTRAMUSCULAR | Status: DC | PRN
Start: 2020-02-12 — End: 2020-02-13

## 2020-02-12 MED ORDER — LIDOCAINE-EPINEPHRINE 1 %-1:100000 IJ SOLN
INTRAMUSCULAR | Status: DC | PRN
Start: 2020-02-12 — End: 2020-02-12
  Administered 2020-02-12: 20 mL

## 2020-02-12 MED ORDER — OXYCODONE HCL 5 MG PO TABS
15.0000 mg | ORAL_TABLET | ORAL | Status: DC | PRN
Start: 2020-02-12 — End: 2020-02-13
  Administered 2020-02-12: 15 mg via ORAL
  Filled 2020-02-12: qty 3

## 2020-02-12 MED ORDER — AMMONIA AROMATIC IN INHA
1.0000 | Freq: Once | RESPIRATORY_TRACT | Status: DC | PRN
Start: 2020-02-12 — End: 2020-02-12

## 2020-02-12 MED ORDER — PROMETHAZINE HCL 25 MG PO TABS
25.0000 mg | ORAL_TABLET | Freq: Four times a day (QID) | ORAL | Status: DC | PRN
Start: 2020-02-12 — End: 2020-02-13

## 2020-02-12 MED ORDER — LIDOCAINE HCL 2 % IJ SOLN
INTRAMUSCULAR | Status: DC | PRN
Start: 2020-02-12 — End: 2020-02-12
  Administered 2020-02-12: 100 mg

## 2020-02-12 MED ORDER — HYDROXYZINE PAMOATE 25 MG PO CAPS
50.0000 mg | ORAL_CAPSULE | Freq: Two times a day (BID) | ORAL | Status: DC | PRN
Start: 2020-02-12 — End: 2020-02-13

## 2020-02-12 MED ORDER — PROMETHAZINE HCL 25 MG RE SUPP
25.0000 mg | Freq: Four times a day (QID) | RECTAL | Status: DC | PRN
Start: 2020-02-12 — End: 2020-02-13

## 2020-02-12 MED ORDER — BISACODYL 10 MG RE SUPP
10.0000 mg | Freq: Every day | RECTAL | Status: DC | PRN
Start: 2020-02-12 — End: 2020-02-13

## 2020-02-12 MED ORDER — FENTANYL CITRATE (PF) 50 MCG/ML IJ SOLN (WRAP)
INTRAMUSCULAR | Status: DC | PRN
Start: 2020-02-12 — End: 2020-02-12
  Administered 2020-02-12: 50 ug via INTRAVENOUS
  Administered 2020-02-12: 25 ug via INTRAVENOUS

## 2020-02-12 MED ORDER — DOCUSATE SODIUM 100 MG PO CAPS
100.0000 mg | ORAL_CAPSULE | Freq: Two times a day (BID) | ORAL | Status: DC
Start: 2020-02-12 — End: 2020-02-13
  Administered 2020-02-12 – 2020-02-13 (×2): 100 mg via ORAL
  Filled 2020-02-12 (×2): qty 1

## 2020-02-12 MED ORDER — PHENYLEPHRINE 100 MCG/ML IV SYRINGE FOR INFUSION (ANESTHESIA)
PREFILLED_SYRINGE | INTRAVENOUS | Status: DC | PRN
Start: 2020-02-12 — End: 2020-02-12
  Administered 2020-02-12: 50 ug/min via INTRAVENOUS

## 2020-02-12 MED ORDER — FENTANYL CITRATE (PF) 50 MCG/ML IJ SOLN (WRAP)
INTRAMUSCULAR | Status: AC
Start: 2020-02-12 — End: ?
  Filled 2020-02-12: qty 2

## 2020-02-12 MED ORDER — PROMETHAZINE HCL 25 MG/ML IJ SOLN
6.2500 mg | Freq: Once | INTRAMUSCULAR | Status: AC | PRN
Start: 2020-02-12 — End: 2020-02-12

## 2020-02-12 MED ORDER — OXYCODONE HCL 5 MG PO TABS
10.0000 mg | ORAL_TABLET | ORAL | Status: DC | PRN
Start: 2020-02-12 — End: 2020-02-13
  Administered 2020-02-12 – 2020-02-13 (×3): 10 mg via ORAL
  Filled 2020-02-12 (×3): qty 2

## 2020-02-12 MED ORDER — CLONAZEPAM 0.5 MG PO TABS
1.0000 mg | ORAL_TABLET | Freq: Two times a day (BID) | ORAL | Status: DC
Start: 2020-02-12 — End: 2020-02-13
  Administered 2020-02-12 – 2020-02-13 (×2): 1 mg via ORAL
  Filled 2020-02-12 (×2): qty 2

## 2020-02-12 MED ORDER — PROMETHAZINE HCL 25 MG/ML IJ SOLN
INTRAMUSCULAR | Status: AC
Start: 2020-02-12 — End: 2020-02-12
  Administered 2020-02-12: 11:00:00 6.25 mg via INTRAVENOUS
  Filled 2020-02-12: qty 1

## 2020-02-12 MED ORDER — SODIUM CHLORIDE 0.9 % IV SOLN
INTRAVENOUS | Status: DC
Start: 2020-02-12 — End: 2020-02-13

## 2020-02-12 MED ORDER — LEVETIRACETAM 500 MG PO TABS
1500.0000 mg | ORAL_TABLET | Freq: Two times a day (BID) | ORAL | Status: DC
Start: 2020-02-12 — End: 2020-02-13
  Administered 2020-02-12 – 2020-02-13 (×2): 1500 mg via ORAL
  Filled 2020-02-12 (×3): qty 3

## 2020-02-12 MED ORDER — MORPHINE SULFATE (PF) 0.5 MG/ML IJ SOLN
INTRAMUSCULAR | Status: DC | PRN
Start: 2020-02-12 — End: 2020-02-12
  Administered 2020-02-12: 5 mg via INTRAVENOUS

## 2020-02-12 MED ORDER — TRIAMTERENE-HCTZ 37.5-25 MG PO TABS
1.0000 | ORAL_TABLET | Freq: Every day | ORAL | Status: DC
Start: 2020-02-12 — End: 2020-02-13
  Administered 2020-02-13: 09:00:00 1 via ORAL
  Filled 2020-02-12 (×2): qty 1

## 2020-02-12 MED ORDER — LIDOCAINE-EPINEPHRINE 1 %-1:100000 IJ SOLN
INTRAMUSCULAR | Status: AC
Start: 2020-02-12 — End: ?
  Filled 2020-02-12: qty 80

## 2020-02-12 MED ORDER — BUPIVACAINE HCL (PF) 0.75 % IJ SOLN
INTRAMUSCULAR | Status: DC | PRN
Start: 2020-02-12 — End: 2020-02-12

## 2020-02-12 MED ORDER — EPHEDRINE SULFATE 50 MG/ML IJ/IV SOLN (WRAP)
Status: AC
Start: 2020-02-12 — End: ?
  Filled 2020-02-12: qty 1

## 2020-02-12 MED ORDER — ROPIVACAINE HCL 5 MG/ML IJ SOLN
INTRAMUSCULAR | Status: DC | PRN
Start: 2020-02-12 — End: 2020-02-12
  Administered 2020-02-12: 20 mL via EPIDURAL

## 2020-02-12 MED ORDER — CELECOXIB 200 MG PO CAPS
200.0000 mg | ORAL_CAPSULE | Freq: Two times a day (BID) | ORAL | Status: DC
Start: 2020-02-12 — End: 2020-02-13
  Administered 2020-02-12 – 2020-02-13 (×3): 200 mg via ORAL
  Filled 2020-02-12 (×3): qty 1

## 2020-02-12 MED ORDER — HYDROMORPHONE HCL 1 MG/ML IJ SOLN
INTRAMUSCULAR | Status: AC
Start: 2020-02-12 — End: 2020-02-12
  Administered 2020-02-12: 11:00:00 0.5 mg via INTRAVENOUS
  Filled 2020-02-12: qty 1

## 2020-02-12 MED ORDER — ONDANSETRON HCL 4 MG/2ML IJ SOLN
INTRAMUSCULAR | Status: AC
Start: 2020-02-12 — End: ?
  Filled 2020-02-12: qty 2

## 2020-02-12 MED ORDER — PROPOFOL INFUSION 10 MG/ML
INTRAVENOUS | Status: DC | PRN
Start: 2020-02-12 — End: 2020-02-12
  Administered 2020-02-12: 40 mg via INTRAVENOUS

## 2020-02-12 MED ORDER — LEVOTHYROXINE SODIUM 88 MCG PO TABS
88.0000 ug | ORAL_TABLET | Freq: Every day | ORAL | Status: DC
Start: 2020-02-13 — End: 2020-02-13
  Administered 2020-02-13: 06:00:00 88 ug via ORAL
  Filled 2020-02-12: qty 1

## 2020-02-12 SURGICAL SUPPLY — 164 items
AQUACEL AG DRESSING 3.5X9.75 (Dressing) ×1
BANDAGE ACE STERILE 6IN LF (Bandage) ×1
BANDAGE CMPR ESMK 9FTX6IN LF STRL SMTH (Bandage) ×1
BANDAGE CMPR PLSTR CTTN MED MTRX 5YDX6IN (Bandage) ×1
BANDAGE COBAN STERILE 6IN LF (Bandage) ×2
BANDAGE COVERLET 2X3IN (Dressing) ×3 IMPLANT
BANDAGE ELASTIC L5 YD X W6 IN W/SELF-CLOSURE HOOK (Bandage) ×1 IMPLANT
BANDAGE ESMARK COMPRESSION L9 FT X W6 IN (Bandage) ×1
BANDAGE ESMARK COMPRESSION L9 FT X W6 IN SMOOTH FINISH ELASTIC (Bandage) ×1 IMPLANT
BANDAGE ESMARK STRL 6INX9YDS (Bandage) ×1
BANDAGE MEDLINE MEDIUM COMPRESSION L5 YD (Bandage) ×1
BEARING TIB ARCM 3D STD VNGRD 63/67MMX10 (Bearing) ×1 IMPLANT
BEARING TIBIAL 10X63/67MM (Bearing) ×1 IMPLANT
BEARING TIBIAL L63/67 MM X H10 MM KNEE (Bearing) ×1 IMPLANT
BEARING TIBIAL L63/67 MM X H10 MM KNEE VANGUARD ARCOM 3 D STANDARD (Bearing) ×1 IMPLANT
BLADE COATED 2.75 HEX (Blade) ×1
BLADE LINVATC POWERPRO (Blade) ×1
BLADE POWRPRO HIP 64X32X.035 (Blade) ×1
BLADE SAW THK.9 MM OSCILLATE L65 MM X (Blade) ×1
BLADE SAW THK.9 MM OSCILLATE L65 MM X W32 MM LARGE BONE (Blade) ×1 IMPLANT
BLADE SAW TOTAL KNEE ARTHROPLASTY (Blade) ×1
BLADE SAW TOTAL KNEE ARTHROPLASTY OSCILLATING HALL L95 MM X W25.4 MM X (Blade) ×1 IMPLANT
BLADE SW HALL 95X25.4X1.27MM STRL OSC LG (Blade) ×1
BLADE SW THK.9MM 65X32MM OSCILLATE LG BN (Blade) ×1
CEMENT BN BMT 40GM (Cement) ×1 IMPLANT
CEMENT BN REFOBACIN LF STRL DISP (Cement) ×1 IMPLANT
CEMENT BONE 40 GM (Cement) ×1 IMPLANT
CEMENT BONE BIOMET 40 GM (Cement) ×1 IMPLANT
CEMENT BONE R 1X40 (Cement) ×1 IMPLANT
CEMENT BONE REFOBACIN (Cement) ×1 IMPLANT
CEMENT REFOBACIN BONE R 1X40 (Cement) ×1 IMPLANT
COMP VANGRD FMRL CR 60MM LT (Component) ×1 IMPLANT
COMPONENT FEM INTLK VNGRD 60MM CRCTE RTN (Component) ×1 IMPLANT
COMPONENT FEMORAL L60 MM KNEE LEFT CRUCIATE RETAIN INTERLOK VANGUARD (Component) ×1 IMPLANT
COMPONENT FEMORAL L60 MM KNEE LT (Component) ×1 IMPLANT
COMPONENT PATELLAR OD31 MM PEG WIRE (Patella) ×1 IMPLANT
COMPONENT PATELLAR OD31 MM PEG WIRE ASCENT ARCOM KNEE ANTERIOR (Patella) ×1 IMPLANT
COMPONENT PTLR ARCM ASCNT 31MM PG WRE KN (Patella) ×1 IMPLANT
COVER A-V IMPULSE IMPAD RGD MD (Sleeve) ×3 IMPLANT
CUFF TOURNIQUET CYLINDRICAL L34 IN X W4 (Procedure Accessories) ×1
CUFF TOURNIQUET CYLINDRICAL L34 IN X W4 IN 2 PORT BLADDER ATS (Procedure Accessories) ×1 IMPLANT
CUFF TRNQT CYL ATS 34X4IN LF STRL 2 PORT (Procedure Accessories) ×1
CURETTE SMALL L8 MM X W6 MM NO-SCRATCH (Procedure Accessories) ×1
CURETTE SMALL L8 MM X W6 MM NO-SCRATCH WHITNEY CURETTE CEMENT SURGICAL (Procedure Accessories) ×1 IMPLANT
CURETTE SMALL PLASTIC BLUE 8X6 (Procedure Accessories) ×1
CURETTE SRG PLS SM NO-SCRCH WHITNEY CURT (Procedure Accessories) ×1
DRAPE 3/4 SHEET FANFLD 52X76IN (Drape) ×4
DRAPE INCISE IOBAN-2 90X45CM (Drape) ×1
DRAPE SRG IOBN 23X17IN STRL 2 INCS FLM (Drape) ×1
DRAPE SRG TBRN CNVRT 76X52IN LF STRL .75 (Drape) ×4
DRAPE SURGICAL 2 INCISE FILM (Drape) ×1 IMPLANT
DRAPE SURGICAL 3/4 SHEET FANFOLD L76 IN (Drape) ×4
DRAPE SURGICAL 3/4 SHEET FANFOLD L76 IN X W52 IN TIBURON (Drape) ×4 IMPLANT
DRESSING GRMCDL HDRFB IONIC SLVR PU AQCL (Dressing) ×2 IMPLANT
DRESSING KERLIX AMD ROLL 4.1YD (Dressing) ×1
DRESSING WND GZE KRLX 4.125YDX4.5IN LF (Dressing) ×1
DRESSING WOUND KERLIX L4.125 YD X W4.5 (Dressing) ×1
DRESSING WOUND KERLIX L4.125 YD X W4.5 IN 6 PLY ANTIMICROBIAL (Dressing) ×1 IMPLANT
ELECTRODE ADULT PATIENT RETURN L9 FT REM POLYHESIVE ACRYLIC FOAM (Procedure Accessories) ×1 IMPLANT
ELECTRODE ELECTROSURGICAL BLADE L2.5 IN (Blade) ×1
ELECTRODE ELECTROSURGICAL BLADE L2.5 IN OD3/32 IN EDGE L1.1 IN (Blade) ×1 IMPLANT
ELECTRODE ESURG BLDE EDG 3/32IN 2.5IN LF (Blade) ×1
ELECTRODE PATIENT RETURN L9 FT VALLEYLAB (Procedure Accessories) ×1
ELECTRODE PT RTN RM PHSV ACRL FM C30- LB (Procedure Accessories) ×1
GLOVE DERMA SURG LF PF SZ8 (Glove) ×1
GLOVE SRG NPRN PU 8 DRMPRN ULT 11.6IN LF (Glove) ×1
GLOVE SRG PLISPRN 8.5 BGL PI ULTRATOUCH (Glove) ×3
GLOVE SURGICAL 8 GAMMEX CHEMOTHERAPY (Glove) ×1
GLOVE SURGICAL 8 GAMMEX POWDER FREE BEAD CUFF MICRO-TEXTURED NEOPRENE/ (Glove) ×1 IMPLANT
GLOVE SURGICAL 8.5 BIOGEL PI ULTRATOUCH (Glove) ×3
GLOVE SURGICAL 8.5 BIOGEL PI ULTRATOUCH M POWDER FREE TEXTURE BEAD (Glove) ×3 IMPLANT
GLOVES M SIZE 8.5 (Glove) ×3
GOWN SMARTGOWN XXL XLONG (Gown) ×1
GOWN SRG 2XL XLNG SMARTGOWN LF STRL LVL (Gown) ×2
GOWN SURGICAL 2XL XLONG SMARTGOWN LEVEL 4 RAGLAN SLEEVE BREATHABLE (Gown) ×1 IMPLANT
HANDPIECE INTRPLSE W/COAX BONE (Ortho Supply) ×2
HOOD FLYTE PEELAWAY (Procedure Accessories) ×3
HOOD SRG FLYTE STRL PLWY LEN LVL 1 DISP (Procedure Accessories) ×3
HOOD SURGEON ZIPPER PEEL AWAY LENS FLYTE (Procedure Accessories) ×3 IMPLANT
KIT DURAPREP ANTIMICROBIAL (Prep) ×2
KIT RM TURNOVER LF NS DISP (Kits) ×2
KIT ROOM TURNOVER NONSTERILE LATEX FREE DISPOSABLE (Kits) ×1 IMPLANT
KIT TURNOVER ~~LOC~~ ~~LOC~~ (Kits) ×1
KNIFE WHITNEY (Instrument) ×1
MARKER SKIN FINE TIP W/RULER (Other) ×1
MARKER SKIN L6 IN RULER LARGE RESERVOIR (Other) ×1
MARKER SKIN L6 IN RULER LARGE RESERVOIR FINE TIP 6 LABEL GENTIAN (Other) ×1 IMPLANT
MARKER SKN OR NECESSITIES 6IN LF STRL (Other) ×1
MIXER BN CMNT MXVC3 LF STRL DISP (Other) ×2
MIXER BONE CEMENT MIXEVAC III (Other) ×2 IMPLANT
MIXER CEMENT BONE MIXEVAC3 (Other) ×2
NEEDLE SPINAL 20G X 3.5IN (Needles) ×1
NEEDLE SPINAL L3 1/2 IN REGULAR WALL QUINCKE TIP OD20 GA BD (Needles) ×1 IMPLANT
NEEDLE SPNL PP RW BD QNCK 20GA 3.5IN LF (Needles) ×2
PACK SURGICAL BASIN SET FOAK (Other) ×1
PACK SURGICAL BASIN SET FOAK MEDLINE INDUSTRIES, INC. (Other) ×1 IMPLANT
PAD ABD PVC CRTY 9X5IN LF STRL 3 LYR (Dressing) ×2 IMPLANT
PAD ELECTROSRG GRND REM W CRD (Procedure Accessories) ×1
PAD TENDRSRB ABD STRL 5X9IN (Dressing) ×1
PATELLAR 11 150826BUTT (Patella) ×1 IMPLANT
PLATE TIBFIX IBEAM INTRLK 67MM (Plate) ×1 IMPLANT
SCALPEL 20X14MM NO-SCRATCH WHTNY SCULPS KNF PLSTC KNF CRTT SRGCL GRN (Instrument) ×1 IMPLANT
SCALPEL L20 MM X W14 MM NO-SCRATCH (Instrument) ×1
SCALPEL SRG PLS NO-SCRCH WHITNEY SCLP (Instrument) ×1
SET BASIN SINGLE DISP (Other) ×2
SET BASIN ~~LOC~~ ORTHO (Procedure Accessories) ×1
SET HANDPIECE SUCTION TUBE COAXIAL BONE (Ortho Supply) ×1
SET HANDPIECE SUCTION TUBE COAXIAL BONE CLEAN TIP INTERPULSE (Ortho Supply) ×1 IMPLANT
SET SRGBSN LF STRL ORTH DISP (Procedure Accessories) ×1
SET SURGICAL BASIN ORTHO (Procedure Accessories) ×1
SET SURGICAL BASIN ORTHO MEDLINE INDUSTRIES, INC. (Procedure Accessories) ×1 IMPLANT
SOL NACL .9% IRRIG 500ML (Irrigation Solutions) ×1
SOL NACL 0.9% IRR 3000CC (Irrigation Solutions) ×1
SOL WATER STERILE 1000CC BTLE (Irrigation Solutions) ×1
SOLN IRRISEPT WOUND DEBRIDEMNT (Solution) ×1
SOLUTION IRR 0.9% NACL 3L URMTC LF PLS (Irrigation Solutions) ×2
SOLUTION IRR 0.9% NACL 500ML LF STRL PLS (Irrigation Solutions) ×1
SOLUTION IRRIGATION 0.9% SDM CHLORIDE 500ML PR BTTL ISOTONIC NONPRGNC (Irrigation Solutions) ×1 IMPLANT
SOLUTION IRRIGATION 0.9% SODIUM CHLORIDE (Irrigation Solutions) ×1
SOLUTION IRRIGATION 0.9% SODIUM CHLORIDE 3000 ML PLASTIC CONTAINER (Irrigation Solutions) ×1 IMPLANT
SOLUTION SRGPRP 74% ISPRP 0.7% IOD (Prep) ×2
SOLUTION SURGICAL PREP 26 ML DURAPREP (Prep) ×2
SOLUTION SURGICAL PREP 26 ML DURAPREP 74% ISOPROPYL ALCOHOL 0.7% (Prep) ×2 IMPLANT
SPONGE GAUZE L4 IN X W4 IN 12 PLY WOVEN (Dressing) ×1
SPONGE GAUZE L4 IN X W4 IN 12 PLY WOVEN FOLD EDGE XRAY DETECT COTTON (Dressing) ×1 IMPLANT
SPONGE GZE CTTN 4X4IN LF STRL 12 PLY WVN (Dressing) ×1
SPONGE WOVEN STRL GAUZE 4X4IN (Dressing) ×1
STAPLER SKIN L3.9 MM X W6.9 MM WIDE 35 (Skin Closure) ×1
STAPLER SKIN L3.9 MM X W6.9 MM WIDE 35 COUNT FIX HEAD RATCHET (Skin Closure) ×1 IMPLANT
STAPLER SKIN PROXIMATE WIDE (Skin Closure) ×1
STAPLER SKN SS W PRX PX .58MM 3.9X6.9MM (Skin Closure) ×1
SUCTION SYSTEM ORTHOPEDIC (Suction) ×1
SUTURE ABS 1 CT1 VCL 36IN BRD COAT VIOL (Suture) ×4
SUTURE ABS 2-0 CP-1 VCL 27IN BRD COAT UD (Suture) ×4
SUTURE COATED VICRYL 1 CT-1 L36 IN BRAID (Suture) ×4
SUTURE COATED VICRYL 1 CT-1 L36 IN BRAID COATED VIOLET ABSORBABLE (Suture) ×4 IMPLANT
SUTURE COATED VICRYL 2-0 CP-1 L27 IN (Suture) ×4 IMPLANT
SUTURE VICRYL 1 CT1 27IN (Suture) ×4
SUTURE VICRYL 2-0 CP1 27IN (Suture) ×4
SYRINGE 20 ML BD LUER-LOK MEDICAL (Syringes, Needles) ×1 IMPLANT
SYRINGE LUER-LOK STERILE 20CC (Syringes, Needles) ×1
SYRINGE MED 20ML LL LF STRL (Syringes, Needles) ×2
SYSTEM ORTHOPREP STACKHOUSE SUCTION PULSED LAVAGE LATEX FREE (Suction) ×1 IMPLANT
SYSTEM PLS LAV ORTHOPREP STKHS LF SCT (Suction) ×2
SYSTEM WND IRR 0.05% CHG IRRISEPT LF (Solution) ×1
SYSTEM WOUND IRRIGATION DEBRIDEMENT (Solution) ×1
SYSTEM WOUND IRRIGATION DEBRIDEMENT CLEANSING IRRISEPT 0.05% (Solution) ×1 IMPLANT
TOURNIQUET 34IN STRL (Procedure Accessories) ×1
TOWEL L26 IN X W17 IN COTTON PREWASH (Procedure Accessories) ×1
TOWEL L26 IN X W17 IN COTTON PREWASH DELINT BLUE ACTISORB SURGICAL (Procedure Accessories) ×1 IMPLANT
TOWEL OR STRL BLUE 17X26IN (Procedure Accessories) ×1
TOWEL SRG CTTN 26X17IN LF STRL PREWASH (Procedure Accessories) ×1
TRAY SRG TOTAL KNEE ~~LOC~~ (Pack) ×2
TRAY SURGICAL TOTAL KNEE ~~LOC~~ (Pack) ×1 IMPLANT
TRAY TIB INTLK COCR ASCNT MXM 67MM I (Plate) ×1 IMPLANT
TRAY TIBIAL KNEE ASCENT MAXIM I BEAM L67 (Plate) ×1 IMPLANT
TRAY TIBIAL KNEE ASCENT MAXIM I BEAM L67 MM INTERLOK COCR (Plate) ×1 IMPLANT
TRAY TOTAL KNEE ~~LOC~~ (Pack) ×1
WATER STERILE PLASTIC POUR BOTTLE 1000 (Irrigation Solutions) ×1
WATER STERILE PLASTIC POUR BOTTLE 1000 ML (Irrigation Solutions) ×1 IMPLANT
WATER STRL 1000ML LF PLS PR BTL (Irrigation Solutions) ×1
WRAP CMPR POR CBN 5YDX6IN LF STRL SLFADH (Bandage) ×2
WRAP COMPRESSION L5 YD X W6 IN SELF (Bandage) ×2
WRAP COMPRESSION L5 YD X W6 IN SELF ADHERENT ELASTIC LIGHTWEIGHT HAND (Bandage) ×2 IMPLANT

## 2020-02-12 NOTE — Discharge Instr - AVS First Page (Addendum)
Reason for your Hospital Admission:        Instructions for after your discharge:         Home Health Discharge Information     Your doctor has ordered Physical Therapy in-home service(s) for you while you recuperate at home, to assist you in the transition from hospital to home.     The agency that you or your representative chose to provide the service:  Name of Home Health Agency Placement:  [facility]) [phone]           The above services were set up by:  Adine Madura [facility] Liaison) Phone [phone]        IF YOU HAVE NOT HEARD FROM YOUR HOME YOUR HOME HEALTH AGENCY WITHIN 24-48 HOURS AFTER DISCHARGE PLEASE CALL YOUR AGENCY TO ARRANGE A TIME FOR YOUR FIRST VISIT. FOR ANY SCHEDULING CONCERNS OR QUESTIONS RELATED TO HOME HEALTH, SUCH AS TIME OR DATE PLEASE CONTACT YOUR HOME HEALTH AGENCY AT THE NUMBER LISTED ABOVE.      1. Eliquis twice a day for a month for blood clot prevention    2. Ok to shower with staples in but do not soak in water until the staples are removed    3. The aquacel dressing is to stay in place for 7 days and then be changed. Follow the directions for the dressing otherwise. It is ok to get the aquacel dressing wet when showering and you do not need to cover it for the shower.    4. Follow up with Dr Gillermina Phy at 2 weeks post op.    5. It is fine to sleep in any position you like as long as there is no pillow behind the knee.    6. Do not put the calf up on anything hard but do spend every meal with the foot up on a chair to encourage extension.    7. Notify Dr Gillermina Phy for redness, drainage, or temp above 100.5    8. Outpatient physical therapy should start no later than 2 weeks post op.  Please be sure to set it up well in advance so there is no break in your treatment.    9. Thigh high teds are to be work all day but can be removed at night for sleeping.    10. Staples will be removed in 2 weeks at your follow up with Dr Gillermina Phy unless  you are in a rehab facility where they should remove the staples in 2 weeks post op.

## 2020-02-12 NOTE — Anesthesia Preprocedure Evaluation (Addendum)
Anesthesia Evaluation    AIRWAY    Mallampati: II    TM distance: >3 FB  Neck ROM: full  Mouth Opening:full   CARDIOVASCULAR    cardiovascular exam normal, regular and normal       DENTAL    no notable dental hx     PULMONARY    pulmonary exam normal     OTHER FINDINGS                  Relevant Problems   No relevant active problems     Past Medical History:   Diagnosis Date    Abnormal vision     glasses    Arrhythmia 2018    SVT (had ablation)    Arthritis     Complication of anesthesia     urinary retention with every joint replacement, BP drop with spinal X1, another time spinal didn't work    Convulsions     epilepsy, last seizure 2010    Deep venous thrombosis of distal lower extremity 2018    after cholecystectomy; left leg    Encounter for blood transfusion 2018    Gastric ulceration 2018    GI bleed with requiring 4 units PRBCs per pt    Hypothyroidism                Anesthesia Plan    ASA 2     spinal               (Extensive discussion regarding the risks and benefits of spinal v general anesthesia. Patient willing to attempt spinal despite hypotension following spinal during c-sections previously. Will plan for GA as back-up and post-op adductor canal block. )      Detailed anesthesia plan: spinal      Post Op: plan for postoperative opioid use    Post op pain management: PO analgesics, PNB single shot and other    informed consent obtained    Plan discussed with CRNA and attending.      pertinent labs reviewed             Signed by: Loletha Grayer 02/12/20 6:25 AM

## 2020-02-12 NOTE — Progress Notes (Signed)
Transfer criteria met. On transfer patient easily arousable. Respirations even and unlabored. IV patent. Vital signs stable. Patient reports pain level as tolerable. Surgical site clean, dry and intact. Report called to receiving RN, Adrianne and given in ISHAPED format. Patient transported via Bed with all belongings. Patient's family updated on patient location.

## 2020-02-12 NOTE — Progress Notes (Signed)
Received Pt from PACU, VSS. Pt oriented to unit, call bell system, Pt verbalized understanding and signed safety contract. Mepilex, gauze, abd, kerlex, ted ace CDI no drainage noted at this time. IV fluids initiated, pain medicine. Pt denies nausea, vomiting, SOB and CP. Pt made comfortable in bed. Will continue to monitor.

## 2020-02-12 NOTE — Progress Notes (Signed)
Patient has a small red spot on her left knee, pt states it is not a scratch, it's just a "red spot, I get them here and there thru out my body". OR Charge:Jane notified, she sent Ike to see pt, awaiting for surgeon's arrival.

## 2020-02-12 NOTE — H&P (Signed)
Pt examined, skin acceptable, no updates to h and p

## 2020-02-12 NOTE — Discharge Instructions (Signed)

## 2020-02-12 NOTE — PT Eval Note (Signed)
[facility]  Physical Therapy Evaluation and Treatment    Patient: Jennifer [name]  MRN#: [id number]  Unit: [address] SURG  Bed: A2603/A2603-01    Time of Evaluation and Treatment:  Time Calculation  PT Received On: 02/12/20  Start Time: 1452  Stop Time: 1515  Time Calculation (min): 23 min    Evaluation Time: 10 minutes  Treatment Time: 13 minutes    Chart Review and Collaboration with Care Team: 5 minutes, not included in above time    PT Visit Number: 1    Consult received for Jennifer [name] for PT Evaluation and Treatment.  Patients medical condition is appropriate for Physical therapy intervention at this time.    ___________________________________________________    POST ACUTE CARE THERAPY RECOMMENDATIONS:   Discharge Recommendation: Home with supervision, Home with outpatient PT       DME Recommended for Discharge: Single point cane, Front wheel walker    Therapy recommendations are subject to change with patient status.  Please refer to the most recent PT note for up-to-date recommendations.  ___________________________________________________      Activity Orders:  OT eval and treat, PT eval and treat and progressive mobility protocol    Precautions and Contraindications:  Precautions  Weight Bearing Status: LLE total weight bearing  Other Precautions: High fall risk,    Personal Protective Equipment (PPE)  procedure mask, eye shield/covering and pt wore procedure mask    Medical Diagnosis:  Primary osteoarthritis of left knee [M17.12]    History of Present Illness:  Jennifer [name] is a 64 y.o. female admitted on 02/12/2020 for LTKA    Patient Active Problem List   Diagnosis    Primary osteoarthritis of left knee       Past Medical/Surgical History:  Past Medical History:   Diagnosis Date    Abnormal vision     glasses    Arrhythmia 2018    SVT (had ablation)    Arthritis     Complication of anesthesia     urinary retention with every joint replacement, BP drop with spinal X1,  another time spinal didn't work    Convulsions     epilepsy, last seizure 2010    Deep venous thrombosis of distal lower extremity 2018    after cholecystectomy; left leg    Encounter for blood transfusion 2018    Gastric ulceration 2018    GI bleed with requiring 4 units PRBCs per pt    Hypothyroidism      Past Surgical History:   Procedure Laterality Date    ADENOIDECTOMY      BACK SURGERY  1973    lumbar    CARDIAC ABLATION  2018    SVT    CHOLECYSTECTOMY  2018    COLON SURGERY  2008    resection, perforated colon with colonoscopy    COLONOSCOPY      JOINT REPLACEMENT      bilat hips, L 2005, R 2010, right knee 2015    OTHER SURGICAL HISTORY  2018    IVC filter    TONSILLECTOMY         X-Rays/Tests/Labs:  Lab Results   Component Value Date/Time    HGB 13.3 02/04/2020 10:42 AM    HCT 39.4 02/04/2020 10:42 AM    K 3.8 02/04/2020 10:42 AM    NA 144 02/04/2020 10:42 AM       All imaging reviewed, please see chart for details.    Social History:  Prior Level  of Function  Prior level of function: Independent with ADLs, Ambulates independently, Ambulates with assistive device  Assistive Device: Single point cane (Past 2 weeks 2/2 pain)  Baseline Activity Level: Community ambulation  Driving: independent  DME Currently at Home: [id number], [employer], [place], [employer] Living Arrangements  Living Arrangements: Spouse/significant other, Family members  Type of Home: House  Home Layout: Multi-level, Bed/bath upstairs, Stairs to enter with rails (add number in comment) (FOS)  DME Currently at Home: [employer], [employer], [place], [employer]      Subjective:  Patient is agreeable to participation in the therapy session. Nursing clears patient for therapy.  Patient Goal: To do well  Pain Assessment  Pain Assessment: Numeric Scale (0-10)  Pain Score: 4-moderate pain  POSS Score: Awake and Alert  Pain Location: Knee  Pain Orientation: Left  Pain Frequency: Continuous  Pain Intervention(s): Medication (See  eMAR);Cold applied;Repositioned;Ambulation/increased activity      Objective:  Observation of Patient/Vital Signs:  BP 114/76    Pulse 90    Temp 97.5 F (36.4 C) (Oral)    Resp 14    Ht 1.6 m (5\' 3" )    Wt 97.4 kg (214 lb 11.2 oz)    SpO2 98%    BMI 38.03 kg/m        Cognitive Status and Neuro Exam:  Cognition/Neuro Status  Arousal/Alertness: Appropriate responses to stimuli  Attention Span: Appears intact  Orientation Level: Oriented X4  Memory: Appears intact  Following Commands: Follows all commands and directions without difficulty  Safety Awareness: minimal verbal instruction  Insights: Fully aware of deficits;Educated in safety awareness  Behavior: calm;cooperative  Motor Planning: intact    Musculoskeletal Examination  Gross ROM  Right Upper Extremity ROM: within functional limits  Left Upper Extremity ROM: within functional limits  Right Lower Extremity ROM: within functional limits  Left Lower Extremity ROM: within functional limits (Knee 10 supine to 90 sitting)  Gross Strength  Right Upper Extremity Strength: within functional limits  Left Upper Extremity Strength: within functional limits  Right Lower Extremity Strength: within functional limits  Left Lower Extremity Strength:  (SLR 2+/5, knee NT, DF/PF 4/5)       Functional Mobility:  Functional Mobility  Supine to Sit: Stand by Assist;HOB raised  Scooting to EOB: Supervision  Sit to Supine: Contact Guard Assist (for LLE)  Sit to Stand: Contact Guard Assist;with instruction for hand placement to increase safety  Stand to Sit: Nurse, learning disability  Ambulation: Contact Guard Assist;with front-wheeled walker  Pattern: L foot decreased clearance;decreased cadence;decreased step length;Step through     Balance  Balance  Balance: within functional limits (w/ RW)    Participation and Activity Tolerance  Participation and Endurance  Participation Effort: good    Educated the patient to role of physical therapy, plan of care, goals of therapy  and safety with mobility and ADLs; patient verbalized understanding.    Patient left in bed with all medical equipment in place and call bell and all personal items/needs within reach (of note, pt received without alarm in place).  RN notified of session outcome.      Assessment:  Jennifer [name] is a 64 y.o. female admitted 02/12/2020. Pt would benefit from Physical Therapy to address deficits and increase functional independence and for L TKA rehab.Marland Kitchen     PT Assessment  Assessment: Decreased LE ROM;Decreased LE strength;Decreased functional mobility;Decreased balance;Gait impairment  Prognosis: Good;With continued PT status  post acute discharge  Progress: Progressing toward goals      Treatment:  -Patient Instructed in importance of gaining full ROM for both ext/flex in order to have a normal functioning knee.  Discussed importance of postioning knee in full extension; no pillow under knee or bend in knee of bed.  Also, importance of OOB throughout day and continued amb with nursing staff.   -additional sit<> stand from bed and toilet w/ SBA inst to reach back and push up from surface  -gt an additional 40' w/ RW and SBA inst for even strides R and L  -HEP HO provided and pt performed BLE AP, gluteal/quad sets x10, LLE heel slide x3, AAROM SLR x5  -All questions answered and all needs in place    Patient has not met clinical pathway goals for gait and transfers; and discharge instructions provided with coach absent.  Patients OP appointment is on is in Oct has 2 weeks HH PT set up.    Therapist discussed recommendation(s) of supervision and outpatient physical therapy with patient. Patient was not agreeable. 2/2 Dr. Henderson Newcomer office informed her that she would have HH first.    Patient is not cleared for D/C from PT standpoint. RN notified.        Plan:  Treatment/Interventions: Exercise, Gait training, Stair training, Neuromuscular re-education, Functional transfer training, LE strengthening/ROM,  Patient/family training, Equipment eval/education  PT Frequency: twice a day  Risks/Benefits/POC Discussed with Pt/Family: With patient    PMP Activity: Step 7 - Walks out of Room  Distance Walked (ft) (Step 6,7): 65 Feet      Goals:  Goals  Goal Formulation: With patient  Time for Goal Acheivement: By time of discharge  Goals: Select goal  Pt Will Go Supine To Sit: modified independent, to maximize functional mobility and independence  Pt Will Perform Sit To Supine: modified independent, to maximize functional mobility and independence  Pt Will Perform Sit to Stand: modified independent, to maximize functional mobility and independence  Pt Will Ambulate: 101-150 feet, with rolling walker, modified independent, to maximize functional mobility and independence  Pt Will Go Up / Down Stairs: 1 flight, modified independent, With rail, With [facility], to maximize functional mobility and independence  Pt Will Perform Home Exer Program: modified independent, to maximize functional mobility and independence    Norval Gable, PT [phone]  02/12/2020 3:22 PM    Monday - Wednesday 12:30 - 21:00  Thursday - Friday        8:00 - 16:30

## 2020-02-12 NOTE — Progress Notes (Addendum)
confined to the home and needs intermittent skilled nursing care, physical therapry and / or speech therapy or continues to need occupational therapy. The patient is under my care, and I have initiated the establishment of the plan of care. This patient will be followed by a physician who will periodically review the plan of care.    Collection Information    Consult Order Info    ID Description Priority Start Date Start Time   [id number] Home Health face-to-face (FTF) Encounter Routine 02/12/2020 2:32 PM   Provider Specialty Referred to   ______________________________________ _____________________________________   Verbal Order Info    Action Created on Order Mode Entered by Responsible Provider Signed by Signed on   Ordering 02/12/20 1432 Telephone with Larey Days, RN Harvie Bridge, MD     Patient Information    Patient Name   Jennifer [name] Legal Sex   Female DOB   27-Jun-1955   Additional Information    Associated Reports External References   Priority and Order Details [facility]        [facility]        Patient Name: Jennifer [name], Jennifer [name]     MRN: [id number]     CSN: [id number]       Account Information    Hosp Acct #   [id number] Patient Class   Inpatient Service  Orthopedics Accommodation Code  Semi-Private     Admission Information    Admitting Physician:  Attending Physician: Harvie Bridge, MD  Harvie Bridge, MD Unit  AX 26NO Erasmo Score* L&D Status     Admitting Diagnosis: Primary osteoarthritis of left knee Room / Bed  A2603/A2603-01 L&D   Last Menstrual Cycle     Chief Complaint:      Admit Type:  Admit Date/Time:  Discharge Date/Time: Elective  02/12/2020 / 8657   /  Length of Stay: 0 Days   L&D EDD   Estimated Date of Delivery: None noted.     Patient Information            Home Address: [address]  [place] [address] Employer:  Employer Address:     ,     Main Phone: [phone] Employer Phone:    SSN: [id number]     DOB: July 31, 1955 (64 yrs)     Sex: Female Primary Care Physician: Luz Lex, MD   Marital Status: Married Referring Physician:       Harvie Bridge, MD   Race: White or Caucasian     Ethnicity: Non Hispanic/Latino     Emergency Contacts  Name Home Phone Work Phone Mobile Phone Relationship SHERRI, MCARTHY [phone]  [phone] Spouse No        Guarantor Information    Guarantor Name: Jennifer [name], Jennifer [name] Guarantor ID: [id number]   Guarantor Relationship to Pt: Self Guarantor Type: Personal/Family   Guarantor DOB:   [birth year]     Guarantor Address: [address] [place], [place] [address]       Guarantor Home Phone: [phone] Guarantor Employer:        Guarantor Work Phone:  Pensions consultant Emp Phone:               Chief Technology Officer Name: [employer] A [employer] B Subscriber Name: [employer]   Insurance Address:   [address]  [place] [address] Subscriber DOB: 02-10-1956     Subscriber ID: [id number]   Insurance  Phone:  Pt Relationship to Sub:   Self   Insurance ID:      Group Name:  Preauthorization #: NPR   Group #:  Preauthorization Days:      Secondary Insurance    Insurance Name: Cornelious Bryant INDEMNITY Subscriber Name: [employer] Address:   [address] [address]  [place], [place] [address] Subscriber DOB: [birth year]     Subscriber ID: [id number]   Insurance Phone:  Pt Relationship to Sub:   Self   Insurance ID:      Group Name:  Preauthorization #:    Group #: [employer] Preauthorization Days:       [employer] Name: - Subscriber Name:    Community education officer Address:     ,   Statistician DOB:      Subscriber ID:    Press photographer:  Pt Relationship to Sub:      Insurance ID:      Group Name:  Preauthorization #:    Group #:  Preauthorization Days:        02/12/2020 2:33 PM  confined to the home and needs intermittent skilled nursing care, physical therapry and / or speech therapy or continues to need occupational therapy. The patient is under my care, and I have initiated the establishment of the plan of care. This patient will be followed by a physician who will periodically review the plan of care.    Collection Information    Consult Order Info    ID Description Priority Start Date Start Time   [id number] Home Health face-to-face (FTF) Encounter Routine 02/12/2020 2:32 PM   Provider Specialty Referred to   ______________________________________ _____________________________________   Verbal Order Info    Action Created on Order Mode Entered by Responsible Provider Signed by Signed on   Ordering 02/12/20 1432 Telephone with Larey Days, RN Harvie Bridge, MD     Patient Information    Patient Name   Jennifer [name] Legal Sex   Female DOB   27-Jun-1955   Additional Information    Associated Reports External References   Priority and Order Details [facility]        [facility]        Patient Name: Jennifer [name], Jennifer [name]     MRN: [id number]     CSN: [id number]       Account Information    Hosp Acct #   [id number] Patient Class   Inpatient Service  Orthopedics Accommodation Code  Semi-Private     Admission Information    Admitting Physician:  Attending Physician: Harvie Bridge, MD  Harvie Bridge, MD Unit  AX 26NO Erasmo Score* L&D Status     Admitting Diagnosis: Primary osteoarthritis of left knee Room / Bed  A2603/A2603-01 L&D   Last Menstrual Cycle     Chief Complaint:      Admit Type:  Admit Date/Time:  Discharge Date/Time: Elective  02/12/2020 / 8657   /  Length of Stay: 0 Days   L&D EDD   Estimated Date of Delivery: None noted.     Patient Information            Home Address: [address]  [place] [address] Employer:  Employer Address:     ,     Main Phone: [phone] Employer Phone:    SSN: [id number]     DOB: July 31, 1955 (64 yrs)     Sex: Female Primary Care Physician: Luz Lex, MD   Marital Status: Married Referring Physician:       Harvie Bridge, MD   Race: White or Caucasian     Ethnicity: Non Hispanic/Latino     Emergency Contacts  Name Home Phone Work Phone Mobile Phone Relationship SHERRI, MCARTHY [phone]  [phone] Spouse No        Guarantor Information    Guarantor Name: Jennifer [name], Jennifer [name] Guarantor ID: [id number]   Guarantor Relationship to Pt: Self Guarantor Type: Personal/Family   Guarantor DOB:   [birth year]     Guarantor Address: [address], [place] [address]       Guarantor Home Phone: [phone] Guarantor Employer:        Guarantor Work Phone:  Pensions consultant Emp Phone:               Chief Technology Officer Name: [employer] A [employer] B Subscriber Name: [employer]   Insurance Address:   [address]  [place] [address] Subscriber DOB: 02-10-1956     Subscriber ID: [id number]   Insurance  Phone:  Pt Relationship to Sub:   Self   Insurance ID:      Group Name:  Preauthorization #: NPR   Group #:  Preauthorization Days:      Secondary Insurance    Insurance Name: Cornelious Bryant INDEMNITY Subscriber Name: [employer] Address:   [address] [address]  [place], [place] [address] Subscriber DOB: [birth year]     Subscriber ID: [id number]   Insurance Phone:  Pt Relationship to Sub:   Self   Insurance ID:      Group Name:  Preauthorization #:    Group #: [employer] Preauthorization Days:       [employer] Name: - Subscriber Name:    Community education officer Address:     ,   Statistician DOB:      Subscriber ID:    Press photographer:  Pt Relationship to Sub:      Insurance ID:      Group Name:  Preauthorization #:    Group #:  Preauthorization Days:        02/12/2020 2:33 PM

## 2020-02-12 NOTE — Anesthesia Procedure Notes (Signed)
Spinal      Patient location during procedure: OR  Reason for block: post-op pain management    Block at Surgeon's request: Yes      Start time: 02/12/2020 7:42 AM    End time: 02/12/2020 7:48 AM    Staffing  Performed: anesthesiologist   Anesthesiologist: Loletha Grayer, MD      Pre-procedure Checklist   Completed: patient identified, surgical consent, pre-op evaluation, timeout performed, risks and benefits discussed, monitors and equipment checked, anesthesia consent given and correct site      Spinal  Patient monitoring: pulse oximetry, NIBP and nasal cannula O2    Premedication: Yes    Patient position: sitting    Sterile Technique: chlorhexidine gluconate and isopropyl alcohol, patient draped, mask used and wearing gloves  Skin Local: lidocaine 1%        Approach: midline  Number of attempts: 1      Needle Placement    Needle gauge: 25      Intrathecal space entered at L3-4        Paresthesia Pain: no    Catheter Placement   CSF Return: Yes  Blood Return: No              Assessment   Sensory level: Bilateral  Block Outcome: patient tolerated procedure well, no complications and successful block  Additional Notes  Patient awake and conversant throughout

## 2020-02-12 NOTE — Anesthesia Procedure Notes (Addendum)
Peripheral    Patient location during procedure: PACU  Reason for block: Post-op pain managment      Injection technique: Single Shot  Block Region: Adductor Canal/Saphenous  Laterality: Left    Block at surgeon's request Yes    Start time: 02/12/2020 10:19 AM  End time: 02/12/2020 10:23 AM      Staffing  Anesthesiologist: Loletha Grayer, MD  Performed: Anesthesiologist       Pre-procedure Checklist   Completed: patient identified, surgical consent, pre-op evaluation, timeout performed, risks and benefits discussed, anesthesia consent given and correct site  Timeout Completed:  02/12/2020 10:19 AM      Peripheral Block  Patient monitoring: Pulse oximetry, EKG, NIBP and Nasal cannula O2  Patient position: Supine  Premedication: No  Local infiltration: Lidocaine 1%      Needle  Needle type: Stim needle   Needle gauge: 21 G  Needle length: 4 in      Guidance: nerve stimulator and ultrasound guided  Ultrasound Guided: LA spread visualized, Needle visualized, Relevant anatomy identified (nerve, vessels, muscle) and Image stored or printed          Assessment   Incremental injection: yes  Injection made incrementally with aspirations every 5 mL.  Injection Resistance: no  Paresthesia Pain: No    Blood Aspirated: No  no suspected intravascular injection  Patient tolerated procedure well: Yes  Block Outcome: No complications and Successful block    Additional Notes  Patient awake and conversant throughout

## 2020-02-12 NOTE — Plan of Care (Signed)
Problem: Moderate/High Fall Risk Score >5  Goal: Patient will remain free of falls  Outcome: Progressing  Flowsheets (Taken 02/12/2020 2030)  Moderate Risk (6-13): MOD-Remain with patient during toileting     Problem: SCIP  Goal: SCIP measures are followed  Outcome: Progressing     Problem: Inadequate Airway Clearance  Goal: Patent Airway maintained  Outcome: Progressing  Flowsheets (Taken 02/12/2020 2312)  Patent airway maintained:   Provide adequate fluid intake to liquefy secretions   Reinforce use of ordered respiratory interventions (i.e. CPAP, BiPAP, Incentive Spirometer, Acapella, etc.)   Reposition patient every 2 hours and as needed unless able to self-reposition  Goal: Normal respiratory rate/effort achieved/maintained  Outcome: Progressing     Problem: Infection Prevention  Goal: Free from infection  Outcome: Progressing  Flowsheets (Taken 02/12/2020 2312)  Free from infection:   Monitor/assess vital signs   Encourage/assist patient to turn, cough and perform deep breathing every 2 hours   Assess incision for evidence of healing   Monitor/assess output from surgical drain if present     Problem: Impaired Mobility  Goal: Mobility/Activity is maintained at optimal level for patient  Outcome: Progressing  Flowsheets (Taken 02/12/2020 2312)  Mobility/activity is maintained at optimal level for patient:   Encourage independent activity per ability   Perform active/passive ROM   Maintain proper body alignment   Increase mobility as tolerated/progressive mobility   Plan activities to conserve energy, plan rest periods   Reposition patient every 2 hours and as needed unless able to reposition self   Assess for changes in respiratory status, level of consciousness and/or development of fatigue   Consult/collaborate with Physical Therapy and/or Occupational Therapy     Problem: Constipation  Goal: Fluid and electrolyte balance are achieved/maintained  Outcome: Progressing  Flowsheets (Taken 02/12/2020 2312)  Fluid and  electrolyte balance are achieved/maintained:   Monitor intake and output every shift   Monitor/assess lab values and report abnormal values   Assess for confusion/personality changes   Provide adequate hydration   Assess and reassess fluid and electrolyte status  Goal: Elimination patterns are normal or improving  Outcome: Progressing  Goal: Nutritional intake is adequate  Outcome: Progressing  Goal: Mobility/Activity is maintained at optimal level for patient  Outcome: Progressing  Flowsheets (Taken 02/12/2020 2312)  Mobility/activity is maintained at optimal level for patient:   Encourage independent activity per ability   Perform active/passive ROM   Maintain proper body alignment   Increase mobility as tolerated/progressive mobility   Plan activities to conserve energy, plan rest periods   Reposition patient every 2 hours and as needed unless able to reposition self   Assess for changes in respiratory status, level of consciousness and/or development of fatigue   Consult/collaborate with Physical Therapy and/or Occupational Therapy     Problem: Knee Surgery  Goal: Hemodynamic Stability  Outcome: Progressing  Flowsheets (Taken 02/12/2020 2312)  Hemodynamic stability:   Monitor/assess vital signs   Maintain temperature within desired parameters   Monitor SpO2 and treat as needed   Monitor intake and output.  Notify LIP if urine output is less than 30 ml/hr.   Monitor/assess surgical drainage output if drain is present   Monitor/assess lab values and report abnormal values  Goal: Pain at adequate level as identified by patient  Outcome: Progressing  Flowsheets (Taken 02/12/2020 2312)  Pain at adequate level as identified by patient:   Identify patient comfort function goal   Evaluate if patient comfort function goal is met   Administer analgesics  as prescribed to achieve pain goal   Reposition patient every 2 hours and PRN unless able to self-reposition   Initiate and reinforce use of PCA/PCE, if ordered  Goal: Stable  Neurovascular Status  Outcome: Progressing  Flowsheets (Taken 02/12/2020 2312)  Stable neurovascular status:   Monitor/assess neurovascular status (pulses, capillary refill, pain, paresthesia, presence of edema)   Monitor/assess for return of sensation after nerve block therapy if indicated   VTE prevention: administer anticoagulant(s) and/or apply anti-embolism stockings/devices as ordered   Assess and document plantar/dorsiflexion every 4 hours  Goal: Free from Infection  Outcome: Progressing  Goal: Mobility/activity is maintained at optimum level for patient  Outcome: Progressing  Flowsheets (Taken 02/12/2020 2312)  Mobility/activity is maintained at optimum level for patient:   Administer analgesics as prescribed to achieve pain goal   Utilize special equipment (trapeze, abduction pillow, regular pillow, walker) as needed and as ordered   PT and/or OT evaluation and treatment if ordered   Review weight bearing status with patient/patient care companion   Teach/review/reinforce exercises (ankle pumps, quad sets, gluteal sets)   Teach/review/reinforce knee precautions with patient/patient care companion (no pillow under knee, lock out knee flexion feature on bed)   Dangle/stand at bedside if indicated with assistance as needed   Out of bed to chair if indicated with assistance as needed   Ambulate equal to or greater than 50 feet

## 2020-02-12 NOTE — Plan of Care (Signed)
Please refer to the most recent PT eval or progress note for up-to-date d/c and DME recommendations as they are subject to change with patient status.    Is an Occupational Therapy Evaluation Indicated at this time? No, an acute care OT evaluation is not required at this time.    Treatment/Interventions: Exercise, Gait training, Stair training, Neuromuscular re-education, Functional transfer training, LE strengthening/ROM, Patient/family training, Equipment eval/education    PMP Activity: Step 7 - Walks out of Room  Distance Walked (ft) (Step 6,7): 65 Feet  (Please See Therapy Evaluation for device and assistance level needed)    Goals:   Goals  Goal Formulation: With patient  Time for Goal Acheivement: By time of discharge  Goals: Select goal  Pt Will Go Supine To Sit: modified independent, to maximize functional mobility and independence  Pt Will Perform Sit To Supine: modified independent, to maximize functional mobility and independence  Pt Will Perform Sit to Stand: modified independent, to maximize functional mobility and independence  Pt Will Ambulate: 101-150 feet, with rolling walker, modified independent, to maximize functional mobility and independence  Pt Will Go Up / Down Stairs: 1 flight, modified independent, With rail, With SPC, to maximize functional mobility and independence  Pt Will Perform Home Exer Program: modified independent, to maximize functional mobility and independence

## 2020-02-12 NOTE — Op Note (Signed)
Procedure Date: 02/12/2020     Patient Type: A     SURGEON: Harvie Bridge MD  ASSISTANT:       ASSISTANT:    Lynnda Shields.     PREOPERATIVE DIAGNOSIS:  Degenerative arthritis, left knee, with flexion contracture.     POSTOPERATIVE DIAGNOSIS:  Degenerative arthritis, left knee, with flexion contracture.     TITLE OF PROCEDURE:  Left total knee replacement.     ANESTHESIA:  Spinal.      IMPLANTS USED:  60 cruciate retaining femur, 67 fixed I-beam tibia, 10 standard poly, small  patellar button, lateral release was performed, all cemented.  All [employer]  Vangu[employer] components.     DESCRIPTION OF PROCEDURE:  The patient was taken to the operating room.  Spinal anesthesia was  successfully induced.  She was prepped and draped in a sterile fashion.   Our series of timeouts were taken along the way.  We now did the final site  specific timeout.  She was exsanguinated with an Esmarch bandage, and  tourniquet on the upper thigh inflated to 325 mmHg pressure.  Straight  midline anterior incision was made.  Full-thickness flaps were raised.  The  knee was entered in median parapatellar fashion.  The patella was everted,  synovium was removed.  Fat pad was removed.  The patella was very deformed  much more than it looked on the x-ray.  It really did not mobilize well and  a lateral release was performed.  We used that interval to do subperiosteal  dissection on the lateral side, separating the lateral meniscus from the  capsule.  Subperiosteal dissection medial.  There was no ACL.  There was a  lot of spurring in the intercondylar notch.  Drill holes were made in line  with the long axis of the femur and the tibia.  The cutting rod was placed  followed by the intramedullary guide rod.  The tibia was cut at 8 off the  lateral side resulted in a nice cut.  We cut the femur at 5 degrees 9 mm  and the extension gap was a little bit tight with the 10 in place, but it  straightened out all the way.  We sized it with a 3-degree  external  rotation foot, 60  looked to be the proper size.  Anterior, posterior, and  chamfer cuts were made.  We did notch  a little bit, but we smoothed the  transition over with a saw and a rongeur and created a nice transition  point.  Bone was removed posteriorly behind the femoral condyles with  osteotome.  We did a formal clean up all remaining bone spurs and the  remaining meniscus.  We sized the tibia between 63 and 67, 67 looked better  for coverage purposes.  We had good extension, good flexion, much better  than preoperative where she really only flexed about 95 degrees or so even  under anesthesia on the table.  We pinned the rotation of the tibia in  place.  We turned our attention to the patella.  It was about 21 to 22 mm  in its thinnest portion.  We cut it down to 15 and the small button was  offset to the medial side and carefully positioned superior and inferior to  avoid overhang, but it was really close to the edges.  It tracked well  because we already had a lateral release.  We prepared the femur and the  tibia for the  respective components.  We did several courses of IrriSept  irrigation in the process and then we did our standard 2 batches cement  technique with cleanup in between.  We placed local, placed the real 10  polyethylene, followed by the locking clip.  We dropped the tourniquet and  gave the tranexamic acid after the femoral and patellar components were in  place as the cement was hardening, we had minimal bleeding.  We  electrocoagulated any points that we needed and then after putting the real  polyethylene, in the locking clip closed in a standard fashion.  Final  closure was in progress as this was being dictated.           D:  02/12/2020 09:52 AM by Dr. Clovis Pu. Gillermina Phy, MD [phone])  T:  02/12/2020 14:19 PM by NTS      Everlean Cherry: [id number]) (Doc ID: [id number])

## 2020-02-12 NOTE — Brief Op Note (Signed)
BRIEF OP NOTE    Date Time: 02/12/20 10:07 AM    Patient Name:   Jennifer [name]    Date of Operation:   02/12/2020    Providers Performing:   Surgeon(s):  Harvie Bridge, MD    Assistant (s):   Circulator: Judene Companion, RN  Relief Circulator: Doneta Public, RN  Scrub Person: Colon Flattery  First Assistant: Lynnda Shields    Operative Procedure:   Procedure(s):  LEFT ARTHROPLASTY, KNEE, TOTAL    Preoperative Diagnosis:   Pre-Op Diagnosis Codes:     * Primary osteoarthritis of left knee [M17.12]    Postoperative Diagnosis:   Primary osteoarthritis knee left      Anesthesia:   Spinal    Estimated Blood Loss:        Implants:     Implant Name Type Inv. Item Serial No. Manufacturer Lot No. LRB No. Used Action   CEMENT BONE R 1X40 - [id number] Cement CEMENT BONE R 1X40  [employer]ET [id number] Left 1 Implanted   CEMENT REFOBACIN BONE R 1X40 - [id number] Cement CEMENT REFOBACIN BONE R 1X40  [employer]ET [id number] Left 1 Implanted   PLATE TIBFIX IBEAM INTRLK - [id number] Plate PLATE TIBFIX IBEAM INTRLK  [employer]IMMER BIOMET [id number] Left 1 Implanted   PATELLAR 11 [id number] BUTT - [id number] Patella PATELLAR 11 150826 BUTT  ZIMMER BIOMET [id number] Left 1 Implanted   COMP VANGRD FMRL CR LT - [id number] Component COMP VANGRD FMRL CR LT  [employer]IMMER [employer]OMET [id number] Left 1 Implanted   BEARING TIBIAL 10X63/67MM - [id number] Bearing BEARING TIBIAL 10X63/67MM  ZIMMER BIOMET [id number] Left 1 Implanted         Specimens:       Findings:   ebl 100    No specimen      Complications:         Signed by: Hazeline Junker MAIN OR

## 2020-02-12 NOTE — Transfer of Care (Signed)
Anesthesia Transfer of Care Note    Patient: Jennifer [name]    Procedures performed: Procedure(s):  LEFT ARTHROPLASTY, KNEE, TOTAL    Anesthesia type: General TIVA and Spinal    Patient location:Phase I PACU    Last vitals:   Vitals:    02/12/20 1008   BP: 112/55   Pulse: 97   Resp: 16   Temp: 36.8 C (98.2 F)   SpO2: 100%       Post pain: Patient not complaining of pain, continue current therapy      Mental Status:lethargic    Respiratory Function: tolerating face mask    Cardiovascular: stable    Nausea/Vomiting: patient not complaining of nausea or vomiting    Hydration Status: adequate    Post assessment: no apparent anesthetic complications, no reportable events and no evidence of recall    Signed by: Ulla Potash  02/12/20 10:08 AM

## 2020-02-12 NOTE — Anesthesia Postprocedure Evaluation (Signed)
Anesthesia Post Evaluation    Patient: Jennifer [name]    Procedure(s):  LEFT ARTHROPLASTY, KNEE, TOTAL    Anesthesia type: spinal    Last Vitals:   Vitals Value Taken Time   BP 119/59 02/12/20 1145   Temp 36.8 C (98.2 F) 02/12/20 1008   Pulse 85 02/12/20 1145   Resp 14 02/12/20 1145   SpO2 96 % 02/12/20 1145                 Anesthesia Post Evaluation    Signed by: Loletha Grayer, 02/12/2020 3:42 PM

## 2020-02-13 LAB — GFR: EGFR: >60

## 2020-02-13 LAB — BASIC METABOLIC PANEL
Anion Gap: 9 (ref 5.0–15.0)
BUN: 12 mg/dL (ref 7.0–19.0)
CO2: 21 mEq/L — ABNORMAL LOW (ref 22–29)
Calcium: 6.9 mg/dL — ABNORMAL LOW (ref 8.5–10.5)
Chloride: 112 mEq/L — ABNORMAL HIGH (ref 100–111)
Creatinine: 0.6 mg/dL (ref 0.6–1.0)
Glucose: 104 mg/dL — ABNORMAL HIGH (ref 70–100)
Potassium: 3.4 mEq/L — ABNORMAL LOW (ref 3.5–5.1)
Sodium: 142 mEq/L (ref 136–145)

## 2020-02-13 LAB — HEMOGLOBIN AND HEMATOCRIT, BLOOD
Hematocrit: 30.2 % — ABNORMAL LOW (ref 34.7–43.7)
Hgb: 9.7 g/dL — ABNORMAL LOW (ref 11.4–14.8)

## 2020-02-13 LAB — HEMOLYSIS INDEX: Hemolysis Index: 3 (ref 0–18)

## 2020-02-13 MED ORDER — CEFADROXIL 500 MG PO CAPS
500.0000 mg | ORAL_CAPSULE | Freq: Two times a day (BID) | ORAL | 0 refills | Status: AC
Start: 2020-02-13 — End: 2020-02-20

## 2020-02-13 MED ORDER — APIXABAN 2.5 MG PO TABS
2.5000 mg | ORAL_TABLET | Freq: Two times a day (BID) | ORAL | 0 refills | Status: AC
Start: 2020-02-13 — End: 2020-03-14

## 2020-02-13 MED ORDER — OXYCODONE HCL 5 MG PO TABS
5.0000 mg | ORAL_TABLET | ORAL | 0 refills | Status: DC | PRN
Start: 2020-02-13 — End: 2021-09-25

## 2020-02-13 MED ORDER — TRAMADOL HCL 50 MG PO TABS
50.0000 mg | ORAL_TABLET | Freq: Four times a day (QID) | ORAL | 0 refills | Status: AC
Start: 2020-02-13 — End: 2020-02-20

## 2020-02-13 MED ORDER — CEPHALEXIN 500 MG PO CAPS
500.0000 mg | ORAL_CAPSULE | Freq: Four times a day (QID) | ORAL | Status: DC
Start: 2020-02-13 — End: 2020-02-13
  Administered 2020-02-13 (×2): 500 mg via ORAL
  Filled 2020-02-13 (×8): qty 1

## 2020-02-13 NOTE — Progress Notes (Signed)
Called dr Henderson Newcomer office again after no call back, will try again in 30 min

## 2020-02-13 NOTE — Plan of Care (Signed)
Please refer to the most recent PT eval or progress note for up-to-date d/c and DME recommendations as they are subject to change with patient status.    Is an Occupational Therapy Evaluation Indicated at this time? No, an acute care OT evaluation is not required at this time.    Treatment/Interventions: Exercise, Gait training, Stair training, Functional transfer training, LE strengthening/ROM, Endurance training, Bed mobility    PMP Activity: Step 7 - Walks out of Room  Distance Walked (ft) (Step 6,7): 120 Feet (and 90 ft with fww)  (Please See Therapy Evaluation for device and assistance level needed)    Goals:   Goals  Goal Formulation: With patient  Time for Goal Acheivement: By time of discharge  Goals: Select goal  Pt Will Go Supine To Sit: modified independent, to maximize functional mobility and independence, Partly met  Pt Will Perform Sit To Supine: modified independent, to maximize functional mobility and independence, Partly met  Pt Will Perform Sit to Stand: modified independent, to maximize functional mobility and independence, Partly met  Pt Will Ambulate: 101-150 feet, with rolling walker, modified independent, to maximize functional mobility and independence, Partly met  Pt Will Go Up / Down Stairs: 1 flight, modified independent, With rail, With SPC, to maximize functional mobility and independence, Partly met  Pt Will Perform Home Exer Program: modified independent, to maximize functional mobility and independence, Partly met

## 2020-02-13 NOTE — Progress Notes (Signed)
Called dr Henderson Newcomer office and received on-call physician's number, left message for needing discharge orders since patient passed physical therapy, waiting on call back

## 2020-02-13 NOTE — Plan of Care (Signed)
Problem: Moderate/High Fall Risk Score >5  Goal: Patient will remain free of falls  Outcome: Progressing  Flowsheets (Taken 02/13/2020 0800)  Moderate Risk (6-13):   MOD-Apply bed exit alarm if patient is confused   MOD-Consider activation of bed alarm if appropriate

## 2020-02-13 NOTE — Progress Notes (Signed)
Patient POD1 from L TKA.   Doing well post op.  Cleared PT.  HCT 30.   NVI    Plan for discharge today

## 2020-02-13 NOTE — Progress Notes (Signed)
Provided pt with discharge instructions/prescriptions. Educated pt on wound care, mobility precautions, follow up, and MyChart. Extra dressing/ Ice pack given. Pt denied any further questions/concerns. IV D/C'd, catheter intact. Pt left unit in wheelchair with transport. Pt being discharged home.    Patient was educated on commitment to care document. Patient able to verbalize at least one reason to contact MD:   Yes

## 2020-02-13 NOTE — Progress Notes (Signed)
Acute Pain Service Progress Note:    Date:  02/13/2020 Time:  9:56 AM      Visit Type:  Inpatient Room #   A2603/A2603-01    Surgery:     Procedure(s):  LEFT ARTHROPLASTY, KNEE, TOTAL  02/12/2020  1 Day Post-Op    Type of block: Adductor Canal nerve block     Subjective:  No complaints, patient reports block has worn off and denies any residual numbness/weakness, likely discharge home today     Objective:  Pain Score  2                    Motor Block  No                    Sensory Block  No                    Catheter site  N/A    Assessment:  Surgical pain controlled    Plan:  No further management, continue oral pain medications as per primary team    Start or continue oral pain medications   Yes      We will sign off at this time. Please contact the Anesthesiologist on call ext [phone] with questions/concerns.    Merilyn Baba, MD  Department of Anesthesiology

## 2020-02-13 NOTE — Progress Notes (Signed)
Deliah Boston BSN RN  RN Case Manager  [facility]  [phone]    02/13/20 1406   Discharge Disposition   Patient preference/choice provided? Yes   Physical Discharge Disposition Home, Home Health   Mode of Transportation Car   Patient/Family/POA notified of transfer plan Yes   Patient agreeable to discharge plan/expected d/c date? Yes   Bedside nurse notified of transport plan? Yes   Medicare Checklist   Is this a Medicare patient? Yes

## 2020-02-13 NOTE — Plan of Care (Signed)
Problem: Moderate/High Fall Risk Score >5  Goal: Patient will remain free of falls  02/13/2020 1401 by Randon Goldsmith, RN  Outcome: Completed  02/13/2020 1115 by Randon Goldsmith, RN  Outcome: Progressing  Flowsheets (Taken 02/13/2020 0800)  Moderate Risk (6-13):   MOD-Apply bed exit alarm if patient is confused   MOD-Consider activation of bed alarm if appropriate     Problem: Physical Therapy  Goal: By discharge, patient will perform mobility at the patient's highest functional potential. See PT evaluation/note for goals.  Outcome: Completed     Problem: SCIP  Goal: SCIP measures are followed  02/13/2020 1401 by Randon Goldsmith, RN  Outcome: Completed  02/13/2020 1115 by Randon Goldsmith, RN  Outcome: Progressing  Flowsheets (Taken 02/13/2020 1115)  SCIP measures are followed:   Administer antibiotics as ordered   VTE Prevention: Administer anticoagulant(s) and/or apply anti-embolism stockings/devices as ordered   Provide VTE prophylaxis within 24 hours of anesthesia end time (Note: SCD to lower extremities)     Problem: Inadequate Airway Clearance  Goal: Patent Airway maintained  02/13/2020 1401 by Randon Goldsmith, RN  Outcome: Completed  02/13/2020 1115 by Randon Goldsmith, RN  Outcome: Progressing  Flowsheets (Taken 02/12/2020 2312 by Ricke Hey, RN)  Patent airway maintained:   Provide adequate fluid intake to liquefy secretions   Reinforce use of ordered respiratory interventions (i.e. CPAP, BiPAP, Incentive Spirometer, Acapella, etc.)   Reposition patient every 2 hours and as needed unless able to self-reposition  Goal: Normal respiratory rate/effort achieved/maintained  02/13/2020 1401 by Randon Goldsmith, RN  Outcome: Completed  02/13/2020 1115 by Randon Goldsmith, RN  Outcome: Progressing  Flowsheets (Taken 02/13/2020 1115)  Normal respiratory rate/effort achieved/maintained: Plan activities to conserve energy: plan rest periods     Problem: Infection Prevention  Goal: Free from infection  02/13/2020 1401 by Randon Goldsmith, RN  Outcome: Completed  02/13/2020 1115 by Randon Goldsmith, RN  Outcome: Progressing  Flowsheets (Taken 02/12/2020 2312 by Ricke Hey, RN)  Free from infection:   Monitor/assess vital signs   Encourage/assist patient to turn, cough and perform deep breathing every 2 hours   Assess incision for evidence of healing   Monitor/assess output from surgical drain if present     Problem: Impaired Mobility  Goal: Mobility/Activity is maintained at optimal level for patient  02/13/2020 1401 by Randon Goldsmith, RN  Outcome: Completed  02/13/2020 1115 by Randon Goldsmith, RN  Outcome: Progressing  Flowsheets (Taken 02/12/2020 2312 by Ricke Hey, RN)  Mobility/activity is maintained at optimal level for patient:   Encourage independent activity per ability   Perform active/passive ROM   Maintain proper body alignment   Increase mobility as tolerated/progressive mobility   Plan activities to conserve energy, plan rest periods   Reposition patient every 2 hours and as needed unless able to reposition self   Assess for changes in respiratory status, level of consciousness and/or development of fatigue   Consult/collaborate with Physical Therapy and/or Occupational Therapy     Problem: Constipation  Goal: Fluid and electrolyte balance are achieved/maintained  02/13/2020 1401 by Randon Goldsmith, RN  Outcome: Completed  02/13/2020 1115 by Randon Goldsmith, RN  Outcome: Progressing  Flowsheets (Taken 02/12/2020 2312 by Ricke Hey, RN)  Fluid and electrolyte balance are achieved/maintained:   Monitor intake and output every shift   Monitor/assess lab values and report abnormal values   Assess for confusion/personality changes   Provide adequate hydration   Assess and reassess fluid and electrolyte status  Goal: Elimination patterns are normal  or improving  02/13/2020 1401 by Randon Goldsmith, RN  Outcome: Completed  02/13/2020 1115 by Randon Goldsmith, RN  Outcome: Progressing  Flowsheets (Taken 02/13/2020 1115)  Elimination  patterns are normal or improving: Assess for flatus  Goal: Nutritional intake is adequate  02/13/2020 1401 by Randon Goldsmith, RN  Outcome: Completed  02/13/2020 1115 by Randon Goldsmith, RN  Outcome: Progressing  Flowsheets (Taken 02/13/2020 1115)  Nutritional intake is adequate:   Allow adequate time for meals   Include patient/patient care companion in decisions related to nutrition  Goal: Mobility/Activity is maintained at optimal level for patient  02/13/2020 1401 by Randon Goldsmith, RN  Outcome: Completed  02/13/2020 1115 by Randon Goldsmith, RN  Outcome: Progressing  Flowsheets (Taken 02/12/2020 2312 by Ricke Hey, RN)  Mobility/activity is maintained at optimal level for patient:   Encourage independent activity per ability   Perform active/passive ROM   Maintain proper body alignment   Increase mobility as tolerated/progressive mobility   Plan activities to conserve energy, plan rest periods   Reposition patient every 2 hours and as needed unless able to reposition self   Assess for changes in respiratory status, level of consciousness and/or development of fatigue   Consult/collaborate with Physical Therapy and/or Occupational Therapy

## 2020-02-13 NOTE — PT Progress Note (Signed)
[facility]  Physical Therapy Treatment    Patient:  Jennifer [name]  MRN#:  [id number]  Unit:  [address] SURG  Room/Bed:  A2603/A2603-01    Time of treatment:  Time Calculation  PT Received On: 02/13/20  Start Time: 0830  Stop Time: 0910  Time Calculation (min): 40 min            Chart Review and Collaboration with Care Team: 4 minutes, not included in above time.    PT Visit Number: 2    ___________________________________________________    POST ACUTE CARE THERAPY RECOMMENDATIONS:   Discharge Recommendation: Home with supervision, Home with home health PT      DME Recommended for Discharge: No additional equipment/DME recommended at this time    Therapy recommendations are subject to change with patient status.  Please refer to the most recent PT note for up-to-date recommendations.  ___________________________________________________      Precautions:   Precautions  Weight Bearing Status: LLE total weight bearing  Other Precautions: High fall risk,    Personal Protective Equipment (PPE)  gloves, procedure mask, eye shield/covering, surgical / bouffant cap and pt wore procedure mask    Updated X-Rays/Tests/Labs:  Lab Results   Component Value Date/Time    HGB 9.7 (L) 02/13/2020 04:44 AM    HCT 30.2 (L) 02/13/2020 04:44 AM    K 3.4 (L) 02/13/2020 04:44 AM    NA 142 02/13/2020 04:44 AM       All imaging reviewed, please see chart for details.      Subjective:  I am feeling pretty good         Pain Assessment  Pain Assessment: Numeric Scale (0-10)  Pain Score: 8-severe pain  POSS Score: Awake and Alert  Pain Location: Knee  Pain Orientation: Left  Pain Descriptors: Aching  Pain Frequency: Continuous  Effect of Pain on Daily Activities: severe  Patient's Stated Comfort Functional Goal: 0-No pain  Pain Intervention(s): Cold applied           Patient's medical condition is appropriate for Physical Therapy intervention at this time.  Patient is agreeable to participation in the therapy  session.      Objective:  Observation of Patient/Vital Signs:  BP 106/68    Cognition/Neuro Status  Arousal/Alertness: Appropriate responses to stimuli  Attention Span: Appears intact  Orientation Level: Oriented X4  Memory: Appears intact  Following Commands: Follows all commands and directions without difficulty  Safety Awareness: minimal verbal instruction  Insights: Fully aware of deficits;Educated in safety awareness  Behavior: attentive;calm;cooperative  Motor Planning: intact    Musculoskeletal Examination                  Functional Mobility  Supine to Sit: Supervision  Scooting to [facility]: Supervision  Scooting to EOB: Supervision  Sit to Supine: Supervision  Sit to Stand: Contact Guard Assist;with instruction for hand placement to increase safety  Stand to Sit: Contact Guard Assist     Locomotion  Ambulation: Contact Guard Assist;Stand by Assist;with front-wheeled walker  Pattern: decreased cadence;decreased step length  Stair Management: Contact Guard Assist;one rail R;with cane  Number of Stairs: 4  Distance Walked (ft) (Step 6,7): 120 Feet (and 90 ft with fww)    Therapeutic Exercise  Straight Leg Raise: LLE 10  Quad Sets: LLE 10  Heelslides: LLE 10  Glute Sets: 10  Knee AROM Short Arc Quad: LLE 10  Ankle Pumps: BLE 10  Educated the patient to role of physical therapy, plan of care, goals of therapy and HEP, safety with mobility and ADLs, energy conservation techniques, pursed lip breathing, home safety with verbalized understanding.    Patient left in bed with alarm and all other medical equipment in place and call bell and all personal items/needs within reach.  RN notified of session outcome.      Assessment:  Patient has met clinical pathway goals for gait and transfers; and discharge instructions provided with coach absent.  Patients OP appointment is in October.  Therapist discussed recommendation(s) of supervision and home health therapy with patient. Patient was agreeable.     Patient  is cleared for D/C from PT standpoint. RN notified at [phone].    PT frequency is being decreased to 2-3x/week due to patient progress and joint pathway protocol.    Pt continues to require verbal cues for correct hand placement for sit to stand to sit.  Able to verbalize correct sequencing for stair climbing after verbal cues given.  Pt is somewhat lethargic this am due to pain meds.  Pt should be able to perform sit to stand to sit safely once lethargy dissipates.       PMP Activity: Step 7 - Walks out of Room  Distance Walked (ft) (Step 6,7): 120 Feet (and 90 ft with fww)    Plan:  Treatment/Interventions: Exercise, Gait training, Stair training, Functional transfer training, LE strengthening/ROM, Endurance training, Bed mobility      PT Frequency: 2-3x/wk   Continue plan of care.    Goals:  Goals  Goal Formulation: With patient  Time for Goal Acheivement: By time of discharge  Goals: Select goal  Pt Will Go Supine To Sit: modified independent, to maximize functional mobility and independence, Partly met  Pt Will Perform Sit To Supine: modified independent, to maximize functional mobility and independence, Partly met  Pt Will Perform Sit to Stand: modified independent, to maximize functional mobility and independence, Partly met  Pt Will Ambulate: 101-150 feet, with rolling walker, modified independent, to maximize functional mobility and independence, Partly met  Pt Will Go Up / Down Stairs: 1 flight, modified independent, With rail, With [facility], to maximize functional mobility and independence, Partly met  Pt Will Perform Home Exer Program: modified independent, to maximize functional mobility and independence, Partly met  Nicky Pugh   02/13/2020 9:47 AM  Physical Therapist Assistant      @MYSIG @

## 2020-02-14 NOTE — Addendum Note (Signed)
Addendum  created 02/14/20 1426 by Loletha Grayer, MD    Child order released for a procedure order, Intraprocedure Blocks edited, Order Canceled from Note

## 2020-02-14 NOTE — Addendum Note (Signed)
Addendum  created 02/14/20 1415 by Loletha Grayer, MD    Child order released for a procedure order, Clinical Note Signed, Intraprocedure Blocks edited, Order Canceled from Note

## 2020-02-15 ENCOUNTER — Encounter: Payer: Self-pay | Admitting: Orthopaedic Surgery

## 2020-02-22 NOTE — Telephone Encounter (Signed)
 thanks

## 2020-10-17 ENCOUNTER — Emergency Department: Payer: Medicare Other

## 2020-10-17 ENCOUNTER — Emergency Department
Admission: EM | Admit: 2020-10-17 | Discharge: 2020-10-17 | Disposition: A | Discharge status: Home / Self Care | Payer: Medicare Other | Attending: Student in an Organized Health Care Education/Training Program | Admitting: Student in an Organized Health Care Education/Training Program

## 2020-10-17 DIAGNOSIS — M79609 Pain in unspecified limb: Secondary | ICD-10-CM

## 2020-10-17 DIAGNOSIS — M25562 Pain in left knee: Secondary | ICD-10-CM | POA: Insufficient documentation

## 2020-10-17 DIAGNOSIS — M79605 Pain in left leg: Secondary | ICD-10-CM | POA: Insufficient documentation

## 2020-10-17 DIAGNOSIS — G8929 Other chronic pain: Secondary | ICD-10-CM | POA: Insufficient documentation

## 2020-10-17 DIAGNOSIS — M7989 Other specified soft tissue disorders: Secondary | ICD-10-CM | POA: Insufficient documentation

## 2020-10-17 MED ORDER — OXYCODONE-ACETAMINOPHEN 5-325 MG PO TABS
1.0000 | ORAL_TABLET | Freq: Once | ORAL | Status: AC
Start: 2020-10-17 — End: 2020-10-17
  Administered 2020-10-17: 1 via ORAL
  Filled 2020-10-17: qty 1

## 2020-10-17 MED ORDER — OXYMETAZOLINE HCL 0.05 % NA SOLN
2.0000 | Freq: Once | NASAL | Status: DC
Start: 2020-10-17 — End: 2020-10-17

## 2020-10-17 MED ORDER — ACETAMINOPHEN 325 MG PO TABS
650.0000 mg | ORAL_TABLET | Freq: Four times a day (QID) | ORAL | 0 refills | Status: DC | PRN
Start: 2020-10-17 — End: 2022-04-06

## 2020-10-17 MED ORDER — LIDOCAINE 5 % EX PTCH
1.0000 | MEDICATED_PATCH | CUTANEOUS | 0 refills | Status: DC
Start: 2020-10-17 — End: 2021-09-25

## 2020-10-17 MED ORDER — DICLOFENAC SODIUM 1 % EX GEL
2.0000 g | Freq: Four times a day (QID) | CUTANEOUS | 0 refills | Status: DC
Start: 2020-10-17 — End: 2021-09-25

## 2020-10-17 NOTE — Discharge Instructions (Signed)
Your pain is likely due to edema and inflammation.  There were no blood clots that were seen.  There were no fractures on the x-ray.    You can try the knee immobilizer for comfort.    You can try elevation of your leg, ice packs, lidocaine patches and the Voltaren gel for additional comfort.    You can take Tylenol 650 mg 4 times a day for pain.    Please follow-up with your orthopedic doctor in the next week for reevaluation.    He can also follow-up with your PCP for reevaluation.  Please return to the emergency department if you are having any new or worsening symptoms.

## 2020-10-17 NOTE — ED Provider Notes (Signed)
EMERGENCY DEPARTMENT NOTE     ED Physician: Jerl Santos, MD    HISTORY OF PRESENT ILLNESS     Chief Complaint: Knee Pain       Mechanism of Injury:       65 y.o. female with a history of arthritis, DVT after cholecystectomy presents with left leg pain and swelling.  Patient states she has chronic knee pain on the left that has been worsening.  States that she has been having pain behind her knee rating down her leg and feels like her leg is more swollen than normal.  States the pain is severe, constant, worse with movement and improves when she keeps it still.  Denies any other symptoms such as chest pain, fevers, shortness of breath.  States that she had long distance travel 4 weeks ago.  Denies any numbness or weakness in the leg.  Patient states she has an IVC filter.      MEDICAL HISTORY     Past Medical History:  Past Medical History:   Diagnosis Date   . Abnormal vision     glasses   . Arrhythmia 2018    SVT (had ablation)   . Arthritis    . Complication of anesthesia     urinary retention with every joint replacement, BP drop with spinal X1, another time spinal didn't work   . Convulsions     epilepsy, last seizure 2010   . Deep venous thrombosis of distal lower extremity 2018    after cholecystectomy; left leg   . Encounter for blood transfusion 2018   . Gastric ulceration 2018    GI bleed with requiring 4 units PRBCs per pt   . Hypothyroidism     Past Surgical History:  Past Surgical History:   Procedure Laterality Date   . ADENOIDECTOMY     . ARTHROPLASTY, KNEE, TOTAL Left 02/12/2020    Procedure: LEFT ARTHROPLASTY, KNEE, TOTAL;  Surgeon: Harvie Bridge, MD;  Location: [facility];  Service: Orthopedics;  Laterality: Left;   . BACK SURGERY  1973    lumbar   . CARDIAC ABLATION  2018    SVT   . CHOLECYSTECTOMY  2018   . COLON SURGERY  2008    resection, perforated colon with colonoscopy   . COLONOSCOPY     . JOINT REPLACEMENT      bilat hips, L 2005, R 2010, right knee 2015   . OTHER SURGICAL  HISTORY  2018    IVC filter   . TONSILLECTOMY        Social History:  Social History     Socioeconomic History   . Marital status: Married   Tobacco Use   . Smoking status: Never Smoker   . Smokeless tobacco: Never Used   Vaping Use   . Vaping Use: Never used   Substance and Sexual Activity   . Alcohol use: Yes     Comment: rarely   . Drug use: Never    Family History:  History reviewed. No pertinent family history.   Outpatient Medication:  Discharge Medication List as of 10/17/2020  8:52 PM      CONTINUE these medications which have NOT CHANGED    Details   clonazePAM (KlonoPIN) 1 MG tablet Take 1 mg by mouth 2 (two) times daily   , Historical Med      Ergocalciferol (VITAMIN D2 PO) Take by mouth daily, Historical Med      Lacosamide (Vimpat) 200 MG Tab Take by  mouth 2 (two) times daily, Historical Med      levETIRAcetam (Keppra) 1000 MG tablet Take 1,500 mg by mouth 2 (two) times daily, Historical Med      levothyroxine (SYNTHROID) 88 MCG tablet Take 88 mcg by mouth Once a day at 6:00am, Historical Med      Omega-3 Fatty Acids (Omega-3 Fish Oil) 500 MG Cap Take by mouth daily, Historical Med      oxyCODONE (ROXICODONE) 5 MG immediate release tablet Take 1 tablet (5 mg total) by mouth every 3 (three) hours as needed for Pain, Starting Sat 02/13/2020, E-Rx      rosuvastatin (CRESTOR) 5 MG tablet Take 5 mg by mouth every morning, Historical Med      triamterene-hydrochlorothiazide (DYAZIDE) 37.5-25 MG per capsule Take 1 capsule by mouth every morning, Historical Med              REVIEW OF SYSTEMS   Review of Systems     10/14 Review of Systems completed and negative except as stated above in the HPI   Reviewed: Constitutional, HENT, Eyes, Resp, CV, GI, GU, MSK, Skin, Neuro, Psych    PHYSICAL EXAM       Constitutional: Appears stated age  Eyes: Pupils equal, non-icteric  HENT: NC/AT, no otorrhea, no rhinorrhea, MMM   CV:  Appears well perfused, normal rate, normal rhythm   Resp: Normal effort, clear bilaterally  GI:  Non-distended, soft, no tenderness, bowel sounds present  MSK: Left knee pain, tenderness at the popliteal fossa with pain on range of motion as well as tenderness at the left calf, left lower leg appears to be slightly larger than right.  Neuro: A+Ox3, moving extremities spontaneous   Psych: Intact judgement and insight  Skin: Warm, dry ED Triage Vitals [10/17/20 1804]   Enc Vitals Group      BP 173/81      Heart Rate 71      Resp Rate 19      Temp 98.9 F (37.2 C)      Temp Source Oral      SpO2 99 %      Weight 102.1 kg      Height 1.6 m      Head Circumference       Peak Flow       Pain Score 8      Pain Loc       Pain Edu?       Excl. in GC?         MEDICAL DECISION MAKING     DISCUSSION:  Six 73-year-old female presenting with left knee pain and lower leg pain.  Differential includes osseous injury such as fracture versus DVT versus dependent edema versus inflammatory process such as bursitis versus less likely other process such as infection.  X-ray and DVT ultrasound were performed and did not reveal any acute findings.  Discussed these findings with the patient.  Placed in knee immobilizer for comfort.  Recommended continuing symptom management at home and closely following up outpatient with her orthopedic doctor for reevaluation.  Provided return precautions and discharged home in stable condition.  Patient showed good understanding of plan.          Vital Signs and Nursing Notes: Reviewed the patient's vital signs. Reviewed and utilized available nursing notes.  Medical Records Reviewed: Reviewed available past medical records.  Counseling: The emergency provider has spoken and discussed today's findings with the patient and/or caregiver. They were provided specific details for the  plan of care. Questions were answered and there was agreement with the plan.      PULSE OXIMETRY  CARDIAC STUDIES  IMAGING     Oxygen Saturation by Pulse Oximetry: 98%  Interpretation:  Normal. Interventions: none       Left  knee xray interpreted independently by Davinder Haff, MD: Findings/Impression: No acute fracture or dislocation.  Hardware appears to be intact.      LABORATORY RESULTS  RADIOLOGY    Ordered and independently interpreted AVAILABLE laboratory tests. Please see results section in chart for full details.    US Venous Dopp Left Low Extrem   Final Result         No evidence of left lower extremity deep venous thrombosis.      Carleene Overlie, MD    10/17/2020 8:22 PM      Knee Left 4+ Views   Final Result         No acute osseous abnormality.      Status post total knee arthroplasty, with no evidence of hardware   complication.      Alric Seton MD, MD    10/17/2020 7:55 PM       Results for orders placed or performed during the hospital encounter of 02/12/20   Basic Metabolic Panel   Result Value Ref Range    Glucose 104 (H) 70 - 100 mg/dL    BUN 29.5 7.0 - 62.1 mg/dL    Creatinine 0.6 0.6 - 1.0 mg/dL    Calcium 6.9 (L) 8.5 - 10.5 mg/dL    Sodium 308 657 - 846 mEq/L    Potassium 3.4 (L) 3.5 - 5.1 mEq/L    Chloride 112 (H) 100 - 111 mEq/L    CO2 21 (L) 22 - 29 mEq/L    Anion Gap 9.0 5.0 - 15.0   Hemoglobin and hematocrit, blood   Result Value Ref Range    Hgb 9.7 (L) 11.4 - 14.8 g/dL    Hematocrit 96.2 (L) 34.7 - 43.7 %   Hemolysis index   Result Value Ref Range    Hemolysis Index 3 0 - 18   GFR   Result Value Ref Range    EGFR >60.0         CRITICAL CARE/PROCEDURES    Procedures    EMERGENCY DEPT. MEDICATIONS      ED Medications:  ED Medication Orders (From admission, onward)    Start Ordered     Status Ordering Provider    10/17/20 2002 10/17/20 2001    Once        Route: Each Nare  Ordered Dose: 2 spray     Discontinued Dominic Rhome    10/17/20 2002 10/17/20 2001  oxyCODONE-acetaminophen (PERCOCET) 5-325 MG per tablet 1 tablet  Once        Route: Oral  Ordered Dose: 1 tablet     Last MAR action: Given Annaleah Arata        Prescriptions:  Discharge Medication List as of 10/17/2020  8:52 PM       START taking these medications    Details   acetaminophen (TYLENOL) 325 MG tablet Take 2 tablets (650 mg total) by mouth every 6 (six) hours as needed for Pain, Starting Mon 10/17/2020, E-Rx      diclofenac Sodium (Voltaren) 1 % Gel topical gel Apply 2 g topically 4 (four) times daily, Starting Mon 10/17/2020, E-Rx      lidocaine (LIDODERM) 5 % Place 1 patch onto the skin  every 24 hours Remove & Discard patch within 12 hours or as directed by MD, Starting Mon 10/17/2020, E-Rx         CONTINUE these medications which have NOT CHANGED    Details   clonazePAM (KlonoPIN) 1 MG tablet Take 1 mg by mouth 2 (two) times daily   , Historical Med      Ergocalciferol (VITAMIN D2 PO) Take by mouth daily, Historical Med      Lacosamide (Vimpat) 200 MG Tab Take by mouth 2 (two) times daily, Historical Med      levETIRAcetam (Keppra) 1000 MG tablet Take 1,500 mg by mouth 2 (two) times daily, Historical Med      levothyroxine (SYNTHROID) 88 MCG tablet Take 88 mcg by mouth Once a day at 6:00am, Historical Med      Omega-3 Fatty Acids (Omega-3 Fish Oil) 500 MG Cap Take by mouth daily, Historical Med      oxyCODONE (ROXICODONE) 5 MG immediate release tablet Take 1 tablet (5 mg total) by mouth every 3 (three) hours as needed for Pain, Starting Sat 02/13/2020, E-Rx      rosuvastatin (CRESTOR) 5 MG tablet Take 5 mg by mouth every morning, Historical Med      triamterene-hydrochlorothiazide (DYAZIDE) 37.5-25 MG per capsule Take 1 capsule by mouth every morning, Historical Med              DIAGNOSIS      Diagnosis:  Final diagnoses:   Left leg swelling   Left leg pain   Popliteal pain    Disposition:  ED Disposition     ED Disposition   Discharge    Condition   --    Date/Time   Mon Oct 17, 2020  8:50 PM    Comment   Kathlyne Loud Magnussen discharge to home/self care.    Condition at disposition: Stable                This note was generated by the Epic EMR system and Dragon speech recognition. It may contain inherent errors not intended by the  user due to software limitations. Not all errors are caught or corrected. If there are questions or concerns about the content of this note or information contained within the body of this dictation they should be addressed directly with the author for clarification.     Shaylon Aden, MD  10/21/20 2002

## 2020-10-17 NOTE — ED Notes (Signed)
Assisting primary nurse.  Introduction made to pt.  ID band checked.  Pt able to state name and DOB.  Plan of care discussed.

## 2021-05-19 NOTE — ED Provider Notes (Signed)
 [name] EMERGENCY DEPARTMENT     Chief Complaint   Palpitations (Pt BIBEMS from home; onset of palpitations at around 7pm associated with chest tightness and numbness on both arms; history of SVT last 2018 with ablation; pt did vagal maneuver and had ice water  to relieve the symptoms; pt self counted her pulse at about 180 bpm; when EMS arrived self converted; HR was about 120 bpm; upon arrival HR-80 bpm; wiith history of DVT in the past and with IVC filter in place)       History of Present Illness   Jennifer [name] is a 65 y.o. female with a history of  has a past medical history of DVT (deep venous thrombosis) and SVT (supraventricular tachycardia). who presents to the ED with a chief complaint of palpitations described as her previous episodes of SVT that started at 7:00 p.m. today  and lasted for approximately 35 minutes.  Patient states that she would just gotten out of the shower when she started noticing these symptoms.  Reported some chest heaviness and tingling in her bilateral arms, states that this is typical of her SVT episodes.  States that the last episode was prior to her ablation.  Patient states that she is not on any rate control medications or anticoagulation.  States that she is from [facility] and has moved here in the last year.  States that this is the 1st episode since her ablation.  At present, patient states that she converted prior to EMS arrival and prior to any administration of medications.  Patient states that she is currently asymptomatic, just feels worn out.  Otherwise denies any nausea, vomiting, abdominal pain, dizziness, lightheadedness.     Medical History   Past Medical History:   Diagnosis Date   . DVT (deep venous thrombosis)    . SVT (supraventricular tachycardia)      Past Surgical History:   Procedure Laterality Date   . COLON SURGERY     . LAMINECTOMY     . LAPAROSCOPIC CHOLECYSTECTOMY W/ IOC     . TOTAL HIP ARTHROPLASTY Left    . TOTAL HIP  ARTHROPLASTY Right    . TOTAL KNEE ARTHROPLASTY Left      No family history on file.  Social History     Social History Narrative   . Not on file      Social History     Tobacco Use   . Smoking status: Not on file   . Smokeless tobacco: Not on file   Substance Use Topics   . Alcohol use: Not on file   . Drug use: Not on file         Review of Systems   Review of Systems   All other systems reviewed and are negative.     Physical Exam   Vitals:    05/19/21 2056 05/19/21 2338 05/20/21 0103 05/20/21 0133   BP: 96/77 (!) 110/56 (!) 116/58 116/60   Pulse: 86 73 65 67   Resp: 23  23 15    Temp: 36.2 C (97.2 F)      TempSrc: Oral      SpO2: 100% 100% 97% 98%   Weight: (!) 105.7 kg (233 lb)      Height: 1.6 m (5' 3)        Physical Exam  Constitutional:       General: She is not in acute distress.     Appearance: Normal appearance. She is not ill-appearing.   HENT:  Head: Normocephalic and atraumatic.      Right Ear: Tympanic membrane normal.      Left Ear: Tympanic membrane normal.      Nose: Nose normal.      Mouth/Throat:      Mouth: Mucous membranes are moist.      Pharynx: Oropharynx is clear.   Eyes:      General:         Right eye: No discharge.         Left eye: No discharge.      Extraocular Movements: Extraocular movements intact.      Conjunctiva/sclera: Conjunctivae normal.      Pupils: Pupils are equal, round, and reactive to light.   Cardiovascular:      Rate and Rhythm: Normal rate and regular rhythm.      Pulses: Normal pulses.   Pulmonary:      Effort: Pulmonary effort is normal. No respiratory distress.      Breath sounds: Normal breath sounds.   Abdominal:      General: Bowel sounds are normal. There is no distension.      Palpations: Abdomen is soft.   Musculoskeletal:         General: No swelling or tenderness. Normal range of motion.      Cervical back: Normal range of motion and neck supple.   Skin:     General: Skin is warm and dry.      Capillary Refill: Capillary refill takes less than 2  seconds.   Neurological:      General: No focal deficit present.      Mental Status: She is alert and oriented to person, place, and time.      Cranial Nerves: No cranial nerve deficit.   Psychiatric:         Mood and Affect: Mood normal.         Behavior: Behavior normal.         Thought Content: Thought content normal.         Judgment: Judgment normal.        Procedures   Procedures  No notes on file       Medical Decision Making   LABORATORY EVALUATION:  Results for orders placed or performed during the hospital encounter of 05/19/21   CBC & PLT   Result Value Ref Range    WBC 8.2 3.7 - 11.0 thou/mcL    RBC 4.11 4.01 - 4.90 million/mcL    Hemoglobin 12.8 12.4 - 14.9 G/DL    Hematocrit 60.8 64.1 - 47.9 %    MCV 95.1 87.0 - 98.0 FL    MCH 31.1 27.0 - 33.0 PG    MCHC 32.7 31.0 - 37.0 gm/dL    Platelets 744 849 - 400 K/UL    RDW-SD 44.5 36 - 47 fL    MPV 10.8 8.9 - 11.0 FL    NRBC ABSOLUTE 0.000   thou/mcL    NRBC% 0 0 /100WBC   Comprehensive metabolic panel   Result Value Ref Range    Sodium 138 136 - 146 mmol/L    Potassium 3.8 3.7 - 5.4 MMOL/L    Chloride 100 97 - 108 MMOL/L    CO2 29 20 - 32 MMOL/L    Bun 22 8 - 27 MG/DL    BUN/Creat Ratio 73.4 (H) 11.0 - 26.0    Glucose 138 (H) 65 - 99 mg/dL    Calcium  8.9 8.6 - 10.2 MG/DL    Total  Protein 6.7 6.0 - 8.5 G/DL    Albumin  3.9 3.6 - 4.8 G/DL    Alk Phos 51 Reference Range Female: 25-160 Female: 25-165 U/L    Ast 17 0 - 40 U/L    Alt 20 0 - 40 U/L    Anion Gap 9 7 - 16 mmol/L    Total Bilirubin <0.20 0.00 - 1.20 MG/DL    Creatinine 9.16 9.39 - 1.00 mg/dL    est GFRcr 78 >=39 fO/fpw/8.26f7   Troponin T   Result Value Ref Range    Troponin T <0.010 <0.010   Troponin T   Result Value Ref Range    Troponin T <0.010 <0.010   Rapid EKG 12-Lead   Result Value Ref Range    Heart Rate 83 BPM    Atrial Rate 83 BPM    P-R Interval 172 ms    QRSD Interval 98 ms    QT Interval 376 ms    QTC Interval 441 ms    P Axis 51 degrees    QRS Axis -40 degrees    T Wave Axis 40 degrees         IMAGING:  No results found.    MEDICATIONS RECEIVED IN ED:  Medications - No data to display      MDM:  Patient is otherwise nontoxic appearing in no acute distress.  Spoke with on-call Cardiology Dr. Maree who recommended initiation of metoprolol  and outpatient follow-up.  Patient is otherwise nontoxic appearing in no acute distress, states that she feels comfortable with plan and will follow-up with Cardiology as well as her PCP.  Patient has remained in normal sinus rhythm.  Lab work is largely physiologic.  Feels comfortable going home with outpatient follow-up.     ED Course and Updates   ED Course as of 06/08/21 2207   Fri May 19, 2021   2134 EKG is reviewed interpreted by myself, shows sinus rhythm at a rate of 83 beats per minute, PR interval of 172 milliseconds, QRS duration of 90 milliseconds, QTC of 441 milliseconds, no signs of ST segment elevation depression noted [SU]   2207 Patient continues to be in sinus rhythm. [SU]      ED Course User Index  [SU] Sanaa Uddin, DO          Disposition and Diagnosis   DISPOSITION:  Discharged [1]    DIAGNOSIS:  1. SVT (supraventricular tachycardia)        MEDICATIONS:  Discharge Medication List as of 05/20/2021  1:28 AM        START taking these medications    Details   metoprolol  succinate ER (TOPROL -XL) 25 MG 24 hr tablet Take 1 tablet by mouth daily for 30 days., Disp-30 tablet, R-0, Normal             FOLLOW-UP:  Lemond Henry  [address]  [address]  [place] [address]  [phone]    In 3 days      [facility] Emergency Department  [address]  [place]  [address]  [phone]    As needed    Margretta Maree  [address]  [address]  [place] [address]  [phone]    In 1 week       Attestation   Electronically signed by Adelfa Curb, DO.            Sanaa Uddin, DO  06/08/21 2207

## 2021-07-18 ENCOUNTER — Other Ambulatory Visit (INDEPENDENT_AMBULATORY_CARE_PROVIDER_SITE_OTHER): Payer: Self-pay | Admitting: Gynecology

## 2021-08-02 ENCOUNTER — Telehealth: Payer: Self-pay

## 2021-08-02 NOTE — Telephone Encounter (Signed)
[facility] New Patient Coordinator Note    Physician/Location Preference:    Location Preference: [facility]    Physician Preference: Dr. Smitty Cords     Referral:    Referring Provider: Pearletha Forge, MD    Is Referral required per insurance? No      History:    Personal Hx of Cancer: No    Prior Chemotherapy - No  Prior Radiation - No   Prior Surgery related to Cancer - No    Family Hx of Cancer : Yes - Mother - lung cancer    Biopsy History:    No    Imaging History:    Prior Imaging: Yes    Type of Imaging: Mammogram  Location Performed: [facility]    Other:     Are there patient owned records that will be brought to the first appointment?No    Has the Appointment been scheduled? Yes - 5/1 with Dr. Scotty Court    Patient stated that she will be on a cruise and then out of town most of April and asked for an appointment the first week of May

## 2021-09-25 ENCOUNTER — Ambulatory Visit: Payer: Medicare Other | Attending: Surgery | Admitting: Surgery

## 2021-09-25 ENCOUNTER — Encounter: Payer: Self-pay | Admitting: Surgery

## 2021-09-25 VITALS — BP 138/85 | HR 78 | Temp 98.3°F | Resp 16 | Ht 63.0 in | Wt 259.8 lb

## 2021-09-25 DIAGNOSIS — N6310 Unspecified lump in the right breast, unspecified quadrant: Secondary | ICD-10-CM | POA: Insufficient documentation

## 2021-09-25 NOTE — Progress Notes (Signed)
[facility]  [address] [place]  [phone]    Breast Surgical Oncology Consultation Report      Chief complaint : Right breast lump    History of Present Illness    Ms Jennifer [name] is a 66 y.o. female patient who was referred to me for evaluation and management of right breast lump.    She reports her gynecologist was doing a breast exam and noted a palpable lump at 6:00, inferior to the nipple.  She had not noticed this before.    Clinically, the patient is asymptomatic.  She denies feeling any areas of thickening, skin changes or nipple discharge.  She denies prior history of breast biopsies, breast surgery, cyst aspirations or mastitis.  Other positive or negative symptoms are listed below.    Family History:    -No family history of breast or ovarian cancer    The patient underwent the following workup.  The results and reports were reviewed and scanned into Epic.      07/18/2021- Diagnostic Mammogram and Ultrasound:  - Indication: 66 year old female with right 6:00 breast lump noted by clinician.  Patient denies symptoms.  - Breast Density: almost entirely fatty  - Mammogram:    -No dominant masses, suspicious calcifications or area of architectural distortion.  Specifically no focal abnormality seen in the area of clinical concern on the right.  - Ultrasound - Right Breast:   -Targeted ultrasound of the right breast performed to evaluate the area of clinical concern.  No focal abnormality identified.   - Impression: Negative mammogram and no focal abnormality seen in the area of clinical concern on the right.  Recommend clinical management            All films and reports were independently reviewed      Review of Systems  A comprehensive review of systems was performed and all were negative with the exception of the positives listed below:    Review of Systems   Constitutional: Negative.    HEENT:  Negative.     Respiratory: Negative.     Cardiovascular: Negative.     Gastrointestinal: Negative.    Endocrine: Negative.    GU/GYN: Negative.     Musculoskeletal: Negative.    Neurological: Negative.    Hematological: Negative.    Psychiatric/Behavioral: Negative.              Medications:  Current medications    Current Outpatient Medications:     acetaminophen (TYLENOL) 325 MG tablet, Take 2 tablets (650 mg total) by mouth every 6 (six) hours as needed for Pain, Disp: 30 tablet, Rfl: 0    aspirin 325 MG tablet, Take 325 mg by mouth daily, Disp: , Rfl:     clonazePAM (KlonoPIN) 1 MG tablet, Take 1 mg by mouth 2 (two) times daily  , Disp: , Rfl:     Ergocalciferol (VITAMIN D2 PO), Take by mouth daily, Disp: , Rfl:     Lacosamide (Vimpat) 200 MG Tab, Take by mouth 2 (two) times daily, Disp: , Rfl:     levETIRAcetam (Keppra) 1000 MG tablet, Take 1,500 mg by mouth 2 (two) times daily, Disp: , Rfl:     levothyroxine (SYNTHROID) 88 MCG tablet, Take 88 mcg by mouth Once a day at 6:00am, Disp: , Rfl:     Omega-3 Fatty Acids (Omega-3 Fish Oil) 500 MG Cap, Take by mouth daily, Disp: , Rfl:     rosuvastatin (CRESTOR) 5 MG tablet, Take 5  mg by mouth every morning, Disp: , Rfl:     triamterene-hydrochlorothiazide (DYAZIDE) 37.5-25 MG per capsule, Take 1 capsule by mouth every morning, Disp: , Rfl:     diclofenac Sodium (Voltaren) 1 % Gel topical gel, Apply 2 g topically 4 (four) times daily (Patient not taking: Reported on 09/25/2021), Disp: 100 g, Rfl: 0    lidocaine (LIDODERM) 5 %, Place 1 patch onto the skin every 24 hours Remove & Discard patch within 12 hours or as directed by MD, Disp: 15 patch, Rfl: 0    oxyCODONE (ROXICODONE) 5 MG immediate release tablet, Take 1 tablet (5 mg total) by mouth every 3 (three) hours as needed for Pain (Patient not taking: Reported on 09/25/2021), Disp: 40 tablet, Rfl: 0     Allergies:  No Known Allergies     Past Medical History:  Past Medical History:   Diagnosis Date    Abnormal vision     glasses    Arrhythmia 2018    SVT (had ablation)    Arthritis      Complication of anesthesia     urinary retention with every joint replacement, BP drop with spinal X1, another time spinal didn't work    Convulsions     epilepsy, last seizure 2010    Deep venous thrombosis of distal lower extremity 2018    after cholecystectomy; left leg    Encounter for blood transfusion 2018    Gastric ulceration 2018    GI bleed with requiring 4 units PRBCs per pt    Hypothyroidism         Past Surgical History:  Past Surgical History:   Procedure Laterality Date    ADENOIDECTOMY      ARTHROPLASTY, KNEE, TOTAL Left 02/12/2020    Procedure: LEFT ARTHROPLASTY, KNEE, TOTAL;  Surgeon: Harvie Bridge, MD;  Location: [facility];  Service: Orthopedics;  Laterality: Left;    BACK SURGERY  1973    lumbar    CARDIAC ABLATION  2018    SVT    CHOLECYSTECTOMY  2018    COLON SURGERY  2008    resection, perforated colon with colonoscopy    COLONOSCOPY      JOINT REPLACEMENT      bilat hips, L 2005, R 2010, right knee 2015    OTHER SURGICAL HISTORY  2018    IVC filter    TONSILLECTOMY          GYN History:    Gynecological History   Age of menarche:: 7   Breast pain:: No     Age of menopause:: 70   Birth Control pills:: No     Birth Control pills:: No   G:: 3     P:: 3   Hormone Replacement therapy:: No     Age at first delivery:: 23      Bra size:: 46C      Last mammogram:: 07/18/21   Marital status:: Married     Nipple discharge:: No   Ethnicity:: Caucasian           Past Family History:  Osiris's family history includes Cancer in her mother. .    Past Social History:   Elease Hashimoto   Social History     Socioeconomic History    Marital status: Married     Spouse name: None    Number of children: None    Years of education: None    Highest education level: None   Occupational History    None  Tobacco Use    Smoking status: Never     Passive exposure: Past    Smokeless tobacco: Never   Vaping Use    Vaping status: Never Used   Substance and Sexual Activity    Alcohol use: Yes     Comment: rarely    Drug  use: Never    Sexual activity: None   Other Topics Concern    None   Social History Narrative    None     Social Determinants of Health     Financial Resource Strain: Low Risk  (09/25/2021)    Overall Financial Resource Strain (CARDIA)     Difficulty of Paying Living Expenses: Not very hard   Food Insecurity: No Food Insecurity (09/25/2021)    Hunger Vital Sign     Worried About Running Out of Food in the Last Year: Never true     Ran Out of Food in the Last Year: Never true   Transportation Needs: No Transportation Needs (09/25/2021)    PRAPARE - Therapist, art (Medical): No     Lack of Transportation (Non-Medical): No   Physical Activity: Sufficiently Active (09/25/2021)    Exercise Vital Sign     Days of Exercise per Week: 3 days     Minutes of Exercise per Session: 60 min   Stress: Stress Concern Present (09/25/2021)    [employer] of Occupational Health - Occupational Stress Questionnaire     Feeling of Stress : To some extent   Social Connections: Moderately Integrated (09/25/2021)    Social Connection and Isolation Panel [NHANES]     Frequency of Communication with Friends and Family: More than three times a week     Frequency of Social Gatherings with Friends and Family: Not on file     Attends Religious Services: More than 4 times per year     Active Member of [employer] or Organizations: No     Attends Banker Meetings: Patient refused     Marital Status: Married   Catering manager Violence: Not At Risk (09/25/2021)    Humiliation, Afraid, Rape, and Kick questionnaire     Fear of Current or Ex-Partner: No     Emotionally Abused: No     Physically Abused: No     Sexually Abused: No   Housing Stability: Low Risk  (09/25/2021)    Housing Stability Vital Sign     Unable to Pay for Housing in the Last Year: No     Number of Places Lived in the Last Year: 1     Unstable Housing in the Last Year: No   .  History was reviewed with patient and/or family.     Physical Exam:  BP 138/85 (BP  Site: Left arm, Patient Position: Sitting, Cuff Size: Large)   Pulse 78   Temp 98.3 F (36.8 C) (Oral)   Resp 16   Ht 1.6 m (5\' 3" )   Wt 117.8 kg (259 lb 12.8 oz)   SpO2 96%   BMI 46.02 kg/m     Constitutional: Well-appearing patient in no apparent distress  Head: Normocephalic and atraumatic.   Eyes: No scleral icterus.   Neck: Normal range of motion. Neck supple. No tracheal deviation or thyromegaly present.   Cardiovascular: Regular rate and rhythm, no murmurs.   Pulmonary/Chest: Clear to auscultation bilaterally. Effort normal.   Abdomen: Non-distended  Musculoskeletal: Good shoulder range of motion;  Spine curvature is normal and is non tender  Neurologic: Neurologically grossly intact  Skin: No obvious skin abnormalities  Lymphatic: No evidence of arm swelling  Psychiatric: pleasant/cooperative and appropriate    Breast Exam: She is examined in both the upright and the supine positions.   Both nipples are everted.  Bra Size 46C.  Ptosis Grade 3.   There are no secondary signs of malignancy on visual inspection of either breast.  There is no cervical, supraclavicular, or infraclavicular lymphadenopathy    RIGHT Breast: No palpable masses, she does have a prominent ridge of tissue immediately inferior to the nipple at 6:00, which is symmetric on both breasts.  Bedside ultrasound of this area did not show any abnormalities.  No skin changes, no nipple inversion/retraction/discharge  RIGHT Axilla: No axillary lymphadenopathy     LEFT Breast: No palpable masses, prominent ridge of tissue at 6:00, symmetric with right breast.  No skin changes, no nipple inversion/retraction/discharge  LEFT Axilla: No axillary lymphadenopathy adenopathy.                Assessment/Plan   1. Mass of right breast, unspecified quadrant    Kyna Blahnik is a 66 y.o. patient who presents with concern for right breast mass.  Diagnostic imaging was negative.  On exam, the palpable mass is consistent with a ridge of normal breast  tissue and is present bilaterally.      Treatment Plan  Additional Imaging Needed: At this time she does not need any additional imaging.  The palpable area is consistent with a ridge of normal tissue and is present on both sides.  She will need a screening mammogram in 1 year.  If she feels any new palpable lumps or changes, she should reach out to [place] for additional work-up.  Follow-up: She can resume yearly screening and only needs to follow-up with me as needed.      Lifestyle Risk Reduction: The patient should also consider lifestyle risk reduction for breast cancer including regular exercise (at least 180 of moderate-intensity aerobic activity with strength training weekly), minimizing alcohol intake (1 or less drinks daily), and healthy eating habits, including meals high in fruits, vegetables, nuts and complex carbohydrates, low in animal fats. They should abstain from smoking. They should also avoid Hormone replacement Therapy if able.       Total time spent on this encounter was 30 minutes, which included review of the past medical history, review of imaging, including mammogram and ultrasound,  face to face time with the patient, performing bedside ultrasound examination, and documentation in the patient record.        Molli Hazard, MD  Breast Surgical Oncology  [facility]  T [phone]  F [phone]  [facility] Location:   [address]  [place], [place] [address]

## 2021-12-07 NOTE — ED Provider Notes (Signed)
 [facility] EM[facility]GENCY [facility]PT     Time of Arrival:   12/07/21 1327         Final diagnoses:   [K57.32] Diverticulitis of colon (Primary)         Medical Decision Making:    66 year old female with medical history that includes seizures, hypothyroidism, prior perforated diverticulitis requiring partial colectomy who is presenting with abdominal pain    Differential Diagnosis:   Diverticulitis, perforation, bowel obstruction, colitis, urinary tract infection    Plan: Labs, imaging, medicine                               Glasgow Coma Scale Score: 15            ED Course/Consults:   White blood cell count borderline with predominance of neutrophils  Labs otherwise unremarkable    CT reveals acute uncomplicated diverticulitis of the descending colon.            Discussed the results with the patient.  She is feeling somewhat improved after medication here and she is comfortable returning home.  She is tolerating p.o.  We discussed return to emergency department precaution should there be worsening.  She verbalized understanding and agreement with plan      Documentation/Prior Results Review:  Nursing notes    Imaging Interpreted by me: Not Applicable    CT ABD/PELVIS-IV ONLY   Final Result   IMPRESSION:   1. Mild acute diverticulitis of the descending colon. No   complicating features.   2. Partial RIGHT hemicolectomy.   3. Mild biliary duct dilatation following cholecystectomy.   4. Infrarenal IVC filter noted.         Electronically Signed     By: Jackquline Boxer M.D.     On: 12/07/2021 15:42                Disposition:  Home    .    Discharge Medication List as of 12/07/2021  4:02 PM        START taking these medications    Details   ciprofloxacin HCl (CIPRO) 500 mg PO TABS Take 1 Tab by Mouth Every 12 Hours for 7 days., Disp-14 Tab, R-0, Normal      metroNIDAZOLE (FLAGYL) 500 mg PO TABS Take 1 Tab by Mouth Every 12 Hours for 7 days. NO ALCOHOL, Disp-14 Tab, R-0, Normal       oxyCODONE -acetaminophen  (PERCOCET) 5-325 mg PO TABS Take 1 Tab by Mouth Every 6 Hours As Needed., Disp-14 Tab, R-0, Normal           Chief Complaint   Patient presents with   . ABDOMINAL PAIN     HPI  Jennifer [name] presents to the emergency department complaining of abdominal pain.  66 y/o female with h/o diverticulitis, iatrogenic perforated bowel during colonoscopy s/p partial bowel resection in 2008 presents with left-sided abdominal pain beginning 2 days ago. She states this pain feels similar to prior episode of diverticulitis in 2011. She has had some diarrhea and nausea but no vomiting or fever. Rates pain 10/10 at worst.  She denies fever/chills, cough or shortness of breath, chest pain.    Review of Systems   As per HPI    Physical Exam  Vitals and nursing note reviewed.   Constitutional:       General: She is not in acute distress.     Appearance: Normal appearance. She  is not toxic-appearing.   HENT:      Head: Normocephalic and atraumatic.   Cardiovascular:      Rate and Rhythm: Normal rate and regular rhythm.      Pulses: Normal pulses.   Pulmonary:      Effort: Pulmonary effort is normal. No respiratory distress.      Breath sounds: Normal breath sounds.   Abdominal:      General: There is no distension.      Palpations: Abdomen is soft.      Comments: Left-sided tenderness to palpation without guarding or rebound   Musculoskeletal:         General: No swelling or deformity. Normal range of motion.   Skin:     General: Skin is warm and dry.   Neurological:      General: No focal deficit present.      Mental Status: She is alert and oriented to person, place, and time.      Motor: No weakness.      Gait: Gait normal.       Past Medical History:   Diagnosis Date   . Arthropathy    . COVID-19 vaccine series completed    . DVT (deep venous thrombosis) (HCC)     left leg 2018   . Elevated cholesterol    . Epilepsy (HCC)    . Hypothyroidism    . Presence of IVC filter      Past Surgical History:   Procedure  Laterality Date   . CHOLECYSTECTOMY     . COLON RESECTION     . HIP REPLACEMENT      bil   . KNEE REPLACEMENT      right knee   . LUMBAR LAMINECTOMY       No family history on file.  Social History     Occupational History   . Not on file   Tobacco Use   . Smoking status: Never   . Smokeless tobacco: Never   Vaping Use   . Vaping Use: Never used   Substance and Sexual Activity   . Alcohol use: Not Currently   . Drug use: Never   . Sexual activity: Not on file     Outpatient Medications Marked as Taking for the 12/07/21 encounter [facility] Encounter)   Medication Sig Dispense Refill   . ciprofloxacin HCl (CIPRO) 500 mg PO TABS Take 1 Tab by Mouth Every 12 Hours for 7 days. 14 Tab 0   . metroNIDAZOLE (FLAGYL) 500 mg PO TABS Take 1 Tab by Mouth Every 12 Hours for 7 days. NO ALCOHOL 14 Tab 0   . oxyCODONE -acetaminophen  (PERCOCET) 5-325 mg PO TABS Take 1 Tab by Mouth Every 6 Hours As Needed. 14 Tab 0     No Known Allergies    Vital Signs:  Patient Vitals for the past 72 hrs:   Temp Heart Rate Resp BP BP Mean SpO2 Weight   12/07/21 1632 -- 64 17 128/58 81 MM HG 98 % --   12/07/21 1416 98.1 F (36.7 C) -- -- -- -- -- 106.6 kg (235 lb)   12/07/21 1412 -- -- 22 143/74 97 MM HG -- --   12/07/21 1331 -- 71 -- -- -- 95 % --           Diagnostics:  Labs:    Results for orders placed or performed during the hospital encounter of 12/07/21   CG4+ i-STAT (Lab)   Result Value Ref Range  LACT V POC 1.08 0.90 - 2.00 mmol/L    Cartridge type CG4+    COMPREHENSIVE METABOLIC PANEL   Result Value Ref Range    Potassium 3.7 3.5 - 5.5 mmol/L    Sodium 142 133 - 145 mmol/L    Chloride 98 98 - 110 mmol/L    Glucose 161 (H) 70 - 99 mg/dL    Calcium  10.1 8.4 - 10.5 mg/dL    Albumin  4.3 3.5 - 5.0 g/dL    SGPT (ALT) 22 5 - 40 U/L    SGOT (AST) 19 10 - 37 U/L    Bilirubin Total 0.4 0.2 - 1.2 mg/dL    Alkaline Phosphatase 61 40 - 120 U/L    BUN 14 6 - 22 mg/dL    CO2 29 20 - 32 mmol/L    Creatinine 0.9 0.8 - 1.4 mg/dL    eGFR >39.9 >39.9  fO/fpw/8.26 sq.m.    Globulin 3.2 2.0 - 4.0 g/dL    A/G Ratio 1.3 1.1 - 2.6 ratio    Total Protein 7.5 6.2 - 8.1 g/dL    Anion Gap 84.9 3.0 - 15.0 mmol/L   CBC WITH DIFFERENTIAL AUTO   Result Value Ref Range    WBC 10.1 4.0 - 11.0 K/uL    RBC 4.43 3.80 - 5.20 M/uL    HGB 13.6 11.7 - 16.1 g/dL    HCT 57.6 64.8 - 51.6 %    MCV 96 80 - 99 fL    MCH 31 26 - 34 pg    MCHC 32 31 - 36 g/dL    RDW 87.2 89.9 - 84.4 %    Platelet 294 140 - 440 K/uL    MPV 12.0 9.0 - 13.0 fL    Segmented Neutrophils (Auto) 77 (H) 40 - 75 %    Lymphocytes (Auto) 14 (L) 20 - 45 %    Monocytes (Auto) 6 3 - 12 %    Eosinophils (Auto) 3 0 - 6 %    Basophils (Auto) 0 0 - 2 %    Absolute Neutrophils (Auto) 7.7 1.8 - 7.7 K/uL    Absolute Lymphocytes (Auto) 1.4 1.0 - 4.8 K/uL    Absolute Monocytes (Auto) 0.6 0.1 - 1.0 K/uL    Absolute Eosinophils (Auto) 0.3 0.0 - 0.5 K/uL    Absolute Basophils (Auto) 0.0 0.0 - 0.2 K/uL   LIPASE   Result Value Ref Range    Lipase 24 7 - 60 U/L   Urinalysis w Micro Reflex Culture    Specimen: Clean Catch Urine   Result Value Ref Range    Source Urine      Urine Color Yellow Colorless, Pale Yellow, Light Yellow, Yellow, Dark Yellow, Straw    Urine Clarity Cloudy (A) Clear, Slightly Cloudy    Urine pH 7.5 5.0 - 8.0 pH    Urine Protein Screen Negative Negative, Trace mg/dL    Urine Glucose Negative Negative mg/dL    Urine Ketones Negative Negative mg/dL    Urine Occult Blood Trace (A) Negative    Urine Specific Gravity 1.012 1.005 - 1.030    Urine Nitrite Negative Negative    Urine Leukocyte Esterase Moderate (A) Negative    Urine Bilirubin Negative Negative    Urine Urobilinogen 0.2 <2.0 mg/dL mg/dL    Urine RBC 3-5 (A) Negative, 0-2 /hpf    Urine WBC 11-20 (A) 0 - 5 /hpf    Urine Bacteria Negative Negative    Squamous Epithelial Cells 11-20 (A) None,  0-2 /hpf    Hyaline Cast 3-5 (A) 0 - 2 /lpf       Medications given in the ED  Medications   ketorolac (ToradoL) injection 15 mg (15 mg Intravenous Given 12/07/21 1503)    iohexoL  (OmniPaque ) 300 mg iodine/mL 100 mL (100 mL Intravenous Given 12/07/21 1511)   morphine  injection 4 mg (4 mg Intravenous Given 12/07/21 1533)   ciprofloxacin HCl (Cipro) tablet 500 mg (500 mg Oral Given 12/07/21 1628)   metroNIDAZOLE (FlagyL) tablet 500 mg (500 mg Oral Given 12/07/21 1627)

## 2021-12-07 NOTE — ED Notes (Signed)
 20g IV placed in LAC. Tolerated well.   Labs drawn in blue, light green, lavender, SST, and I-STAT tubes.     All tubes except I-STAT sent to lab labeled [id number].   I-STAT tube kept in ED triage.

## 2022-02-02 NOTE — ED Provider Notes (Signed)
 -------------------------------------------------------------------------------  Attestation signed by Drury Lonni HERO, MD at 02/05/22 [phone]                                                          [facility] [place] [facility]  [facility]  [facility] EMERGENCY DEPT  [address]  [place] [address]  Dept: [phone]  Loc Appt: [phone]  Loc: [phone]        Attending Attestation   I have not seen this patient but we have discussed the management of the case.   I have reviewed the notes, assessments, and/or procedures performed by NP/PA.    I concur with her/his documentation of Jennifer [name] with any additions/changes documented below:        [name], MD   _________________________________________________________________    Vital Signs:  Vitals:    02/02/22 1300 02/02/22 1441 02/02/22 1443 02/02/22 1713   BP:  137/62  130/62   Resp:  17     Temp:  98.1 F (36.7 C)     SpO2: 95% 99%     Weight:   104.3 kg (230 lb)    Height:   5' 3 (1.6 m)        Lab Results:  Labs Reviewed   CHEM8ISTAT (LAB) - Abnormal; Notable for the following components:       Result Value    POTASSIUM 3.3 (*)     All other components within normal limits    Narrative:     The National Kidney Disease Education Program recommends reporting values in mg/dL to two decimal places to reduce rounding errors in the eGFR calculation.  However, the I-STAT device can only report out to one decimal place.         URINALYSIS POC (LAB) - Abnormal; Notable for the following components:    Urine Occult Blood Moderate (*)     Urine Leukocyte Esterase Small (*)     All other components within normal limits   URINALYSIS W MICRO REFLEX CULTURE - Abnormal; Notable for the following components:    Urine Occult Blood Small (*)     Urine Leukocyte Esterase Moderate (*)     Urine RBC 11-20 (*)     Urine WBC 6-10 (*)     Squamous Epithelial Cells 11-20 (*)     All other components within normal  limits    Narrative:     Non-preserved specimen was submitted for urinalysis testing which may be subject to deterioration of its chemical, cellular, and non-cellular constituents unless testing occurs within 2 hours of collection.  Please correlate results clinically with other laboratory results.   PREGNANCY URINE POC (LAB) - Normal   LIPASE - Normal   HEPATIC FUNCTION PANEL - Normal   CBC WITH DIFFERENTIAL AUTO - Normal   URINE CULTURE & SENSITIVITY   CG4+ ISTAT (LAB)   CHEM 8+ ISTAT   LACTIC ACID   PREGNANCY URINE   URINALYSIS   CBC WITH DIFFERENTIAL    Narrative:     The following orders were created for panel order CBC WITH DIFFERENTIAL.  Procedure                               Abnormality  Status                     ---------                               -----------         ------                     CBC WITH DIFFERENTIAL [phone]]  Normal              Final result                 Please view results for these tests on the individual orders.        Radiology Results:  CT ABD/PELVIS-IV ONLY   Final Result   IMPRESSION:   Diverticulosis of colon. There is mild pericolic stranding in the   caudal course of sigmoid close to the rectum suggesting possible   sigmoid diverticulitis. There is no loculated pericolic fluid   collection.      There is no evidence of intestinal obstruction or pneumoperitoneum.   There is no hydronephrosis.      Fatty liver.  Left renal cysts.  Small uterine fibroids.      Other findings as described in the body of the report.         Electronically Signed     By: Gearldine Mary M.D.     On: 02/02/2022 16:51                -------------------------------------------------------------------------------                          [facility] [place]  [facility]  [facility] EM[facility]  [address]  [place] [address]  Dept: [phone]  Loc Appt: [phone]  Loc: [phone]           CC: Abdominal Pain    HPI: 66  y.o. female c/o of constant , moderate  pain in the diffuse portion of the abdomen;   Symptom started:with gradual  onset  Symptoms worsened by: nothing  Symptoms improved by: nothing  Associated Symptoms: Nausea  Liquid diet used as treatment prior to arrival  (+) prior hx of similar problem- diverticulitis      PMH:   Past Medical History:   Diagnosis Date   . Arthropathy    . COVID-19 vaccine series completed    . DVT (deep venous thrombosis) (HCC)     left leg 2018   . Elevated cholesterol    . Epilepsy (HCC)    . Hypothyroidism    . Presence of IVC filter      Surgical Hx:   Past Surgical History:   Procedure Laterality Date   . CHOLECYSTECTOMY     . COLON RESECTION     . HIP REPLACEMENT      bil   . KNEE REPLACEMENT      right knee   . LUMBAR LAMINECTOMY       No family history on file.    SH:  (-) tobacco, (-) alcohol, no drugs, lives with family    Review of Systems    All organ systems are reviewed. Pertinent positive and negative findings are mentioned in the HPI.      PHYSICAL EXAM:  GENERAL APPEARANCE: well nourished, alert, cooperative, + obvious discomfort.  VITALS (  BP_P_R_T): See Nurses Note.  EYES: PERRL, EOMI, (-) scleral icterus.  NOSE: no nasal discharge.  MOUTH: (-) decreased moisture.  THROAT: no tonsillar inflammation.  NECK: supple, no neck tenderness,   BACK: no back tenderness.  CHEST WALL: no chest tenderness.  LUNGS: no wheezing, no rales, no rhonchi, (-) retractions, (-) accessory muscle use, good air exchange bilaterally.  HEART: 81 rate, normal rhythm, normal S1, normal S2, (-) S3, (-) S4, no murmur, no rub.  EXTREMITIES:  good pulses in all extremities, no edema.  SKIN: warm, dry, good color, no rash.  NEURO: motor intact, sensory intact.  MENTAL STATUS: speech clear, oriented X 3, normal affect, responds appropriately to questions.    ABDOMEN: normal BS, soft, moderate tenderness over the diffuse portion of the abdomen, (-) guarding, (-) rebound, no organomegaly, no abdominal  masses.    DIFFERENTIAL DX AND MEDICAL DECISION MAKING: uti, gastritis, gastroenteritis, appy, cholelithiasis, cholecystitis,     DATA REVIEWED:  Reviewed Nursing Triage Note  Patient Vitals for the past 24 hrs:   Temp Heart Rate Resp BP BP Mean SpO2 Weight   02/02/22 1713 -- 62 -- 130/62 85 MM HG -- --   02/02/22 1443 -- -- -- -- -- -- 104.3 kg (230 lb)   02/02/22 1441 98.1 F (36.7 C) 74 17 137/62 87 MM HG 99 % --   02/02/22 1300 -- 81 -- -- -- 95 % --           Pulse Ox: 95 % interpreted as normal  Medication Orders this Encounter   Medications   . morphine  injection 4 mg   . ondansetron  (PF) (Zofran ) injection 4 mg   . sodium chloride  (normal saline) 0.9% infusion   . iohexoL  (OmniPaque ) 300 mg iodine/mL 100 mL   . ondansetron  (ZOFRAN ) 4 mg PO ODT.     Sig: Take 1 Tab by Mouth Every 8 Hours.     Dispense:  12 Tab     Refill:  0   . dicyclomine (BENTYL) 20 mg PO TABS     Sig: Take 1 Tab by Mouth 4 Times Daily As Needed (abdominal pain).     Dispense:  30 Tab     Refill:  0   . oxyCODONE -acetaminophen  (PERCOCET) 5-325 mg PO TABS     Sig: Take 1 Tab by Mouth Every 4 Hours As Needed.     Dispense:  12 Tab     Refill:  0   . ciprofloxacin HCl (CIPRO) 500 mg PO TABS     Sig: Take 1 Tab by Mouth Every 12 Hours for 10 days.     Dispense:  20 Tab     Refill:  0   . metroNIDAZOLE (FLAGYL) 500 mg PO TABS     Sig: Take 1 Tab by Mouth Every 8 Hours for 10 days.     Dispense:  30 Tab     Refill:  0     Results for orders placed or performed during the hospital encounter of 02/02/22   CG4+ i-STAT (Lab)   Result Value Ref Range    LACT V POC 1.71 0.90 - 2.00 mmol/L    Cartridge type CG4+    Chem8, i-STAT (Lab)   Result Value Ref Range    POTASSIUM 3.3 (L) 3.5 - 5.5 mmol/L    CHLORIDE 99 98 - 110 mmol/L    CALCIUM  IONIZED 4.7 4.4 - 5.4 mg/dL    CO2 72.9 79.9 - 67.9 mmol/L  Glucose 99 70 - 99 mg/dL    BUN 18 6 - 22 mg/dL    CREATININE 0.9 0.8 - 1.4 mg/dL    SODIUM 859 866 - 854 mmol/L    HGB 13.9 11.7 - 16.1 g/dL    HCT 58.9  64.8 - 51.6 %    POC-eGFR >60.0 >60.0    Cartridge type CHEM8+    PREGNANCY URINE POC (LAB)   Result Value Ref Range    PREGNANCY URINE Negative Negative   URINALYSIS POC (LAB)   Result Value Ref Range    Urine pH 7.0 5.0 - 8.0 pH    Urine Protein Screen Negative Negative mg/dL    Urine Glucose Negative Negative mg/dL    Urine Ketones Negative Negative mg/dL    Urine Occult Blood Moderate (A) Negative    Urine Specific Gravity 1.015 1.003 - 1.030    Urine Nitrite Negative Negative    Urine Leukocyte Esterase Small (A) Negative    Urine Bilirubin Negative Negative    Urine Urobilinogen 0.2 0.2 - 1.0 mg/dL   Lipase   Result Value Ref Range    Lipase 27 7 - 60 U/L   HEPATIC FUNCTION PANEL   Result Value Ref Range    Albumin  4.3 3.5 - 5.0 g/dL    Total Protein 7.5 6.2 - 8.1 g/dL    Globulin 3.2 2.0 - 4.0 g/dL    A/G Ratio 1.3 1.1 - 2.6 ratio    Bilirubin Total 0.3 0.2 - 1.2 mg/dL    Bilirubin Direct <9.7 0.0 - 0.3 mg/dL    SGOT (AST) 11 10 - 37 U/L    Alkaline Phosphatase 62 40 - 120 U/L    SGPT (ALT) 12 5 - 40 U/L   CBC WITH DIFFERENTIAL AUTO   Result Value Ref Range    WBC 9.8 4.0 - 11.0 K/uL    RBC 4.06 3.80 - 5.20 M/uL    HGB 12.8 11.7 - 16.1 g/dL    HCT 62.7 64.8 - 51.6 %    MCV 92 80 - 99 fL    MCH 32 26 - 34 pg    MCHC 34 31 - 36 g/dL    RDW 86.7 89.9 - 84.4 %    Platelet 283 140 - 440 K/uL    MPV 10.9 9.0 - 13.0 fL    Segmented Neutrophils (Auto) 67 40 - 75 %    Lymphocytes (Auto) 21 20 - 45 %    Monocytes (Auto) 8 3 - 12 %    Eosinophils (Auto) 3 0 - 6 %    Basophils (Auto) 0 0 - 2 %    Absolute Neutrophils (Auto) 6.6 1.8 - 7.7 K/uL    Absolute Lymphocytes (Auto) 2.1 1.0 - 4.8 K/uL    Absolute Monocytes (Auto) 0.8 0.1 - 1.0 K/uL    Absolute Eosinophils (Auto) 0.3 0.0 - 0.5 K/uL    Absolute Basophils (Auto) 0.0 0.0 - 0.2 K/uL   Urinalysis w Micro Reflex Culture    Specimen: Clean Catch Urine   Result Value Ref Range    Source Urine      Urine Color Yellow Colorless, Pale Yellow, Light Yellow, Yellow, Dark  Yellow, Straw    Urine Clarity Clear Clear, Slightly Cloudy    Urine pH 7.0 5.0 - 8.0 pH    Urine Protein Screen Negative Negative, Trace mg/dL    Urine Glucose Negative Negative mg/dL    Urine Ketones Negative Negative mg/dL    Urine Occult  Blood Small (A) Negative    Urine Specific Gravity 1.015 1.005 - 1.030    Urine Nitrite Negative Negative    Urine Leukocyte Esterase Moderate (A) Negative    Urine Bilirubin Negative Negative    Urine Urobilinogen 0.2 <2.0 mg/dL mg/dL    Urine RBC 88-79 (A) Negative, 0-2 /hpf    Urine WBC 6-10 (A) 0 - 5 /hpf    Urine Bacteria Negative Negative    Squamous Epithelial Cells 11-20 (A) None, 0-2 /hpf    Hyaline Cast 0-2 0 - 2 /lpf      CT ABD/PELVIS-IV ONLY   Final Result   IMPRESSION:   Diverticulosis of colon. There is mild pericolic stranding in the   caudal course of sigmoid close to the rectum suggesting possible   sigmoid diverticulitis. There is no loculated pericolic fluid   collection.      There is no evidence of intestinal obstruction or pneumoperitoneum.   There is no hydronephrosis.      Fatty liver.  Left renal cysts.  Small uterine fibroids.      Other findings as described in the body of the report.         Electronically Signed     By: Gearldine Mary M.D.     On: 02/02/2022 16:51                       MEDICAL DECISION MAKING  Number and Complexity of Problems  Differential Diagnosis: 6     MDM Data  External documents reviewed: Epic and Care Everywhere         Tests considered but not ordered: n/a     Decision rules/scores evaluated: n/a       + Diverticulitis will treat for pain have patient follow-up with GI.  Patient feeling better after treatment.   No significant concern on labs or imaging to warrant further testing.   At discharge, pt looked well, nontoxic, no distress, and is good candidate for outpatient follow up. No signs of toxicity to suggests need for further labs, imaging or for admission.   Results discussed w/ patient/family.  All questions  were answered.  Pt is ambulatory on d/c without difficulty. Good PO.       Discussed with: See Ed Course.        Social Determinants of Health that impact treatment or disposition: n/a  Shared decision making: n/a  Code status and discussions: n/a        (K57.32) Diverticulitis of colon - Plan: oxyCODONE -acetaminophen  (PERCOCET) 5-325 mg PO TABS    (N39.0) Urinary tract infection without hematuria, site unspecified    Disposition: Home  Discharge Medication List as of 02/02/2022  5:03 PM        START taking these medications    Details   ciprofloxacin HCl (CIPRO) 500 mg PO TABS Take 1 Tab by Mouth Every 12 Hours for 10 days., Disp-20 Tab, R-0, Normal      dicyclomine (BENTYL) 20 mg PO TABS Take 1 Tab by Mouth 4 Times Daily As Needed (abdominal pain)., Disp-30 Tab, R-0, Normal      metroNIDAZOLE (FLAGYL) 500 mg PO TABS Take 1 Tab by Mouth Every 8 Hours for 10 days., Disp-30 Tab, R-0, Normal      ondansetron  (ZOFRAN ) 4 mg PO ODT. Take 1 Tab by Mouth Every 8 Hours., Disp-12 Tab, R-0, Normal             Odella GORMAN Hasty, NP

## 2022-04-06 ENCOUNTER — Encounter (INDEPENDENT_AMBULATORY_CARE_PROVIDER_SITE_OTHER): Payer: Self-pay | Admitting: Family Medicine

## 2022-04-06 ENCOUNTER — Ambulatory Visit (INDEPENDENT_AMBULATORY_CARE_PROVIDER_SITE_OTHER): Payer: Medicare Other | Admitting: Family Medicine

## 2022-04-06 VITALS — Ht 63.0 in

## 2022-04-06 DIAGNOSIS — I472 Ventricular tachycardia, unspecified: Secondary | ICD-10-CM | POA: Insufficient documentation

## 2022-04-06 DIAGNOSIS — E039 Hypothyroidism, unspecified: Secondary | ICD-10-CM | POA: Insufficient documentation

## 2022-04-06 DIAGNOSIS — Z1231 Encounter for screening mammogram for malignant neoplasm of breast: Secondary | ICD-10-CM

## 2022-04-06 DIAGNOSIS — G40909 Epilepsy, unspecified, not intractable, without status epilepticus: Secondary | ICD-10-CM

## 2022-04-06 DIAGNOSIS — E781 Pure hyperglyceridemia: Secondary | ICD-10-CM | POA: Insufficient documentation

## 2022-04-06 DIAGNOSIS — E785 Hyperlipidemia, unspecified: Secondary | ICD-10-CM

## 2022-04-06 LAB — T4, FREE: T4 Free: 0.83 ng/dL (ref 0.69–1.48)

## 2022-04-06 LAB — COMPREHENSIVE METABOLIC PANEL
ALT: 16 U/L (ref 0–55)
AST (SGOT): 13 U/L (ref 5–41)
Albumin/Globulin Ratio: 1.4 (ref 0.9–2.2)
Albumin: 4 g/dL (ref 3.5–5.0)
Alkaline Phosphatase: 50 U/L (ref 37–117)
Anion Gap: 11 (ref 5.0–15.0)
BUN: 19 mg/dL (ref 7.0–21.0)
Bilirubin, Total: 0.5 mg/dL (ref 0.2–1.2)
CO2: 29 mEq/L (ref 17–29)
Calcium: 10.1 mg/dL (ref 8.5–10.5)
Chloride: 102 mEq/L (ref 99–111)
Creatinine: 0.8 mg/dL (ref 0.4–1.0)
Globulin: 2.8 g/dL (ref 2.0–3.6)
Glucose: 106 mg/dL — ABNORMAL HIGH (ref 70–100)
Potassium: 4.3 mEq/L (ref 3.5–5.3)
Protein, Total: 6.8 g/dL (ref 6.0–8.3)
Sodium: 142 mEq/L (ref 135–145)
eGFR: >60 mL/min/{1.73_m2} (ref >=60)

## 2022-04-06 LAB — CBC
Absolute NRBC: 0 10*3/uL (ref 0.00–0.00)
Hematocrit: 40.6 % (ref 34.7–43.7)
Hgb: 12.6 g/dL (ref 11.4–14.8)
MCH: 30.4 pg (ref 25.1–33.5)
MCHC: 31 g/dL — ABNORMAL LOW (ref 31.5–35.8)
MCV: 97.8 fL — ABNORMAL HIGH (ref 78.0–96.0)
MPV: 11.1 fL (ref 8.9–12.5)
Nucleated RBC: 0 /100 WBC (ref 0.0–0.0)
Platelets: 319 10*3/uL (ref 142–346)
RBC: 4.15 10*6/uL (ref 3.90–5.10)
RDW: 15 % (ref 11–15)
WBC: 5.63 10*3/uL (ref 3.10–9.50)

## 2022-04-06 LAB — LIPID PANEL
Cholesterol / HDL Ratio: 4.2 Index
Cholesterol: 185 mg/dL (ref 0–199)
HDL: 44 mg/dL (ref 40–9999)
LDL Calculated: 92 mg/dL (ref 0–99)
Triglycerides: 245 mg/dL — ABNORMAL HIGH (ref 34–149)
VLDL Calculated: 49 mg/dL — ABNORMAL HIGH (ref 10–40)

## 2022-04-06 LAB — TSH: TSH: 7.43 u[IU]/mL — ABNORMAL HIGH (ref 0.35–4.94)

## 2022-04-06 LAB — HEMOLYSIS INDEX: Hemolysis Index: 2 Index (ref 0–24)

## 2022-04-06 NOTE — Progress Notes (Signed)
Subjective:      Patient ID: Jennifer [name] is a 66 y.o. female.    Chief Complaint:  Chief Complaint   Patient presents with    Establish Care    Hypothyroidism    Hypertension    Hyperlipidemia       HPI:  This is a 66 Yo female patient who coming today to establish medical care on management of her multiple medical conditions. Pt denies any side effects to current medications, report currently taken all the prescribed medications.  *Supraventricular Tachycardia  Patient currently on medications to control chronic SVT, Denies any specific side effects to current medications. Pt denies any SOB, Chest pain, palpitations, syncope, blurred vision. Pt report taken medications as prescribed, no missing many doses, report doing physical activities regularly.  *Hyperlipidemia.  Pt currently on anti-Lipidemic treatment, denies any specific side effects to medications and more exactly denies myalgias, back pain, leg pain.  *Hypothyroidism.  Pt with hx of Hypothyroidism, currently on Hormonal supplementation.  Denies any specific side effects to medications.  Pt denies any significant weight changes, hair loss, palpitations, skin changes, tremors.  *Seizure disorder  Patient currently on management of Neuro disorder by outside Neurology specialist, currently on medical treatment, pt report good response to therapy, denies any side effects.  Denies any suicidal ideation/planning/attempts     The following portions of the patient's history were reviewed and updated as appropriate: allergies, current medications, past family history, past medical history, past social history, past surgical history, and problem list.            Problem List:  Patient Active Problem List   Diagnosis    Primary osteoarthritis of left knee    Mass of right breast, unspecified quadrant    Ventricular tachyarrhythmia    Hyperlipidemia LDL goal <100    Hypothyroidism (acquired)       Current Medications:  Current Outpatient Medications    Medication Sig Dispense Refill    Ascorbic Acid (vitamin C, acerola,) 500 MG tablet Take 1 tablet (500 mg) by mouth daily      aspirin 325 MG tablet Take 1 tablet (325 mg) by mouth daily      clonazePAM (KlonoPIN) 1 MG tablet Take 1 tablet (1 mg) by mouth 2 (two) times daily      Ergocalciferol (VITAMIN D2 PO) Take by mouth daily      Lacosamide (Vimpat) 200 MG Tab Take by mouth 2 (two) times daily      levETIRAcetam (Keppra) 1000 MG tablet Take 1,500 mg by mouth 2 (two) times daily      levothyroxine (SYNTHROID) 88 MCG tablet Take 1 tablet (88 mcg) by mouth Once a day at 6:00am      metoprolol succinate XL (TOPROL-XL) 25 MG 24 hr tablet Take 1 tablet (25 mg) by mouth daily      Omega-3 Fatty Acids (Omega-3 Fish Oil) 500 MG Cap Take by mouth daily      rosuvastatin (CRESTOR) 5 MG tablet Take 1 tablet (5 mg) by mouth every morning      triamterene-hydrochlorothiazide (DYAZIDE) 37.5-25 MG per capsule Take 1 capsule by mouth every morning       No current facility-administered medications for this visit.       Allergies:  No Known Allergies    Past Medical History:  Past Medical History:   Diagnosis Date    Abnormal vision     glasses    Arrhythmia 2018    SVT (had ablation)  Arthritis     Complication of anesthesia     urinary retention with every joint replacement, BP drop with spinal X1, another time spinal didn't work    Convulsions     epilepsy, last seizure 2010    Deep venous thrombosis of distal lower extremity 2018    after cholecystectomy; left leg    Encounter for blood transfusion 2018    Gastric ulceration 2018    GI bleed with requiring 4 units PRBCs per pt    Hypothyroidism        Past Surgical History:  Past Surgical History:   Procedure Laterality Date    ADENOIDECTOMY      ARTHROPLASTY, KNEE, TOTAL Left 02/12/2020    Procedure: LEFT ARTHROPLASTY, KNEE, TOTAL;  Surgeon: Harvie Bridge, MD;  Location: [facility];  Service: Orthopedics;  Laterality: Left;    BACK SURGERY  1973    lumbar     CARDIAC ABLATION  2018    SVT    CHOLECYSTECTOMY  2018    COLON SURGERY  2008    resection, perforated colon with colonoscopy    COLONOSCOPY      JOINT REPLACEMENT      bilat hips, L 2005, R 2010, right knee 2015    OTHER SURGICAL HISTORY  2018    IVC filter    TONSILLECTOMY         Family History:  Family History   Problem Relation Age of Onset    Cancer Mother        Social History:  Social History     Socioeconomic History    Marital status: Married   Tobacco Use    Smoking status: Never     Passive exposure: Past    Smokeless tobacco: Never   Vaping Use    Vaping Use: Never used   Substance and Sexual Activity    Alcohol use: Yes     Comment: rarely- holidays    Drug use: Never     Social Determinants of Health     Financial Resource Strain: Low Risk  (09/25/2021)    Overall Financial Resource Strain (CARDIA)     Difficulty of Paying Living Expenses: Not very hard   Food Insecurity: No Food Insecurity (09/25/2021)    Hunger Vital Sign     Worried About Running Out of Food in the Last Year: Never true     Ran Out of Food in the Last Year: Never true   Transportation Needs: No Transportation Needs (09/25/2021)    PRAPARE - Therapist, art (Medical): No     Lack of Transportation (Non-Medical): No   Physical Activity: Sufficiently Active (09/25/2021)    Exercise Vital Sign     Days of Exercise per Week: 3 days     Minutes of Exercise per Session: 60 min   Stress: Stress Concern Present (09/25/2021)    [employer] of Occupational Health - Occupational Stress Questionnaire     Feeling of Stress : To some extent   Social Connections: Moderately Integrated (09/25/2021)    Social Connection and Isolation Panel [NHANES]     Frequency of Communication with Friends and Family: More than three times a week     Attends Religious Services: More than 4 times per year     Active Member of [employer] or Organizations: No     Attends Banker Meetings: Patient refused     Marital Status: Married    Catering manager  Violence: Not At Risk (09/25/2021)    Humiliation, Afraid, Rape, and Kick questionnaire     Fear of Current or Ex-Partner: No     Emotionally Abused: No     Physically Abused: No     Sexually Abused: No   Housing Stability: Low Risk  (09/25/2021)    Housing Stability Vital Sign     Unable to Pay for Housing in the Last Year: No     Number of Places Lived in the Last Year: 1     Unstable Housing in the Last Year: No        The following sections were reviewed this encounter by the provider:   Tobacco  Allergies  Meds  Problems  Med Hx  Surg Hx  Fam Hx         ROS:  Review of Systems  Constitution:  Patient denies appetite changes, chills, fatigue, fever.  Skin:  Patient denies skin changes, skin lesions or rashes.  Eyes:  Patient denies discharge, itching, redness, blurred vision.  HENT:  Patient denies congestion, ear discharge/pain, facial swelling, mouth sores, nosebleeds.  Respiratory:  Patient denies SOB, tightness, cough, wheezing.  Cardiovascular:  Patient denies chest pain, leg swelling, palpitations.   Gastrointestinal:  Patient denies Abd distention/pain, Abd masses, blood in stool, rectal pain/swelling/bleeding, constipation, nausea, vomiting, diarrhea.  Genitourinary:  Patient denies dysuria, flank pain, hematuria, urgency  Musculoskeletal:  Patient denies arthralgias, back pain, gait problem, myalgias, neck pain/stiffness  Endocrine:  Patient denies any cold/heat intolerance, polydipsia, polyphagia, polyuria.  Neurological:  Patient denies weakness, numbness, tingling, facial drop.  Psychiatric:  Denies any depression, anxiety, insomnia, si/hi/pi     Vitals:  Ht 1.6 m (5\' 3" )   BMI 46.02 kg/m      Objective:     Physical Exam:  Physical Exam   Patient alert, oriented x 3, no distress, comfortable  Skin: skin color, texture, turgor normal, no rashes or lesions.  Oropharynx: Lips, mucosa normal  Eyes: Conjunctivae and corneas clear.  Neck: No adenopathy, no JVD, supple, symmetrical,  trachea midline.  Lungs: Breaths sounds present bilaterally, clear to auscultation, no wheezing, no rales, no rhonchi   Heart: Regular rate and rhythm, S1-S2 present, no murmurs, no rubs, no gallops   Extremities: Extremities normal, atraumatic, no cyanosis or edema  Neuro: Normal without focal finding, mental status, speech normal.  Psychiatric: Normal mood and affect. behavior is normal. Judgment and thought content normal.        Assessment:     1. Hypothyroidism (acquired)  - CBC without differential  - Comprehensive metabolic panel  - Lipid panel  - TSH  - T4, free    2. Hyperlipidemia LDL goal <100  - CBC without differential  - Comprehensive metabolic panel  - Lipid panel  - TSH  - T4, free    3. Ventricular tachyarrhythmia  - CBC without differential  - Comprehensive metabolic panel  - Lipid panel  - TSH  - T4, free    4. Seizure disorder  - CBC without differential  - Comprehensive metabolic panel  - Lipid panel  - TSH  - T4, free    5. Breast cancer screening by mammogram  - Mammo Screening 3D/Tomo Bilateral; Future      Plan:     1-  Currently on thyroid supplementation  Current dose of 88  Mcg.  Report good control of her symptomatology.  Denies any hair loss, palpitations, weight changes  Denies any side effects to medication.  Cont current medications  Check thyroid profile every 3-6 months as required.  RTCX 3-6 months.   2-  Unknown current status  Check lipid profile  Check liver function  Cont current meds  3-  Hx of ventricular tachycardia  On B-Blockers  Report good control of her symptomatology  4-  Stable  Follow by outside Neurology       Tonia Brooms, MD

## 2022-04-06 NOTE — Progress Notes (Signed)
Have you seen any specialists/other providers since your last visit with us?    No      The patient was informed that the following HM items are still outstanding:   There are no preventive care reminders to display for this patient.

## 2022-04-10 ENCOUNTER — Other Ambulatory Visit (INDEPENDENT_AMBULATORY_CARE_PROVIDER_SITE_OTHER): Payer: Self-pay | Admitting: Family Medicine

## 2022-04-10 DIAGNOSIS — E039 Hypothyroidism, unspecified: Secondary | ICD-10-CM

## 2022-05-04 ENCOUNTER — Other Ambulatory Visit (FREE_STANDING_LABORATORY_FACILITY): Payer: Medicare Other

## 2022-05-04 DIAGNOSIS — E039 Hypothyroidism, unspecified: Secondary | ICD-10-CM

## 2022-05-04 LAB — T4, FREE: T4 Free: 0.86 ng/dL (ref 0.69–1.48)

## 2022-05-04 LAB — TSH: TSH: 5.5 u[IU]/mL — ABNORMAL HIGH (ref 0.35–4.94)

## 2022-05-08 ENCOUNTER — Other Ambulatory Visit (INDEPENDENT_AMBULATORY_CARE_PROVIDER_SITE_OTHER): Payer: Self-pay | Admitting: Family Medicine

## 2022-05-08 DIAGNOSIS — E039 Hypothyroidism, unspecified: Secondary | ICD-10-CM

## 2022-05-08 MED ORDER — LEVOTHYROXINE SODIUM 100 MCG PO TABS
100.0000 ug | ORAL_TABLET | ORAL | 0 refills | Status: DC
Start: 2022-05-13 — End: 2022-07-26

## 2022-05-14 ENCOUNTER — Telehealth: Payer: Self-pay | Admitting: Family Medicine

## 2022-05-14 NOTE — Telephone Encounter (Signed)
Patient would like a call back regarding getting a refill on medication GABAPENTIN.   [id number] [place], [place] - [address]

## 2022-05-15 NOTE — Telephone Encounter (Signed)
Left message to call back, Patient will need an appointment medication gabapentin never prescribed by pcp will require face to face in person appointment. Gabapentin controlled substance that require appointment.

## 2022-06-12 ENCOUNTER — Other Ambulatory Visit: Payer: Medicare Other

## 2022-07-09 ENCOUNTER — Other Ambulatory Visit: Payer: Medicare Other

## 2022-07-12 ENCOUNTER — Encounter (INDEPENDENT_AMBULATORY_CARE_PROVIDER_SITE_OTHER): Payer: Self-pay | Admitting: Student in an Organized Health Care Education/Training Program

## 2022-07-12 ENCOUNTER — Ambulatory Visit (INDEPENDENT_AMBULATORY_CARE_PROVIDER_SITE_OTHER): Payer: Medicare Other | Admitting: Student in an Organized Health Care Education/Training Program

## 2022-07-12 VITALS — BP 112/74 | HR 71 | Temp 97.6°F | Ht 63.0 in | Wt 238.0 lb

## 2022-07-12 DIAGNOSIS — E039 Hypothyroidism, unspecified: Secondary | ICD-10-CM

## 2022-07-12 DIAGNOSIS — B372 Candidiasis of skin and nail: Secondary | ICD-10-CM

## 2022-07-12 DIAGNOSIS — M792 Neuralgia and neuritis, unspecified: Secondary | ICD-10-CM

## 2022-07-12 LAB — T4, FREE: T4 Free: 0.96 ng/dL (ref 0.69–1.48)

## 2022-07-12 LAB — TSH: TSH: 4.55 u[IU]/mL (ref 0.35–4.94)

## 2022-07-12 MED ORDER — GABAPENTIN 300 MG PO CAPS
300.0000 mg | ORAL_CAPSULE | Freq: Every evening | ORAL | 0 refills | Status: DC
Start: 2022-07-12 — End: 2022-07-24

## 2022-07-12 MED ORDER — KETOCONAZOLE 2 % EX CREA
TOPICAL_CREAM | Freq: Two times a day (BID) | CUTANEOUS | 1 refills | Status: DC
Start: 2022-07-12 — End: 2022-10-15

## 2022-07-12 NOTE — Progress Notes (Signed)
Have you seen any specialists/other providers since your last visit with [place]? Yes neurologist       The patient was informed that the following HM items are still outstanding:   Health Maintenance Due   Topic Date Due    Burt  Never done    Advance Directive on File  Never done    Medicare Annual Wellness Visit  Never done    Shingrix Vaccine 50+ (1) Never done    HEPATITIS C SCREENING  Never done

## 2022-07-12 NOTE — Progress Notes (Signed)
[facility] PRIMARY CARE  [facility]  PROGRESS NOTE      Patient: Jennifer [name]   Date: 07/12/2022   MRN: [id number]   DOB: July 27, 1955     ASSESSMENT/PLAN     KEITH HANLINE is a 67 y.o. female with:    1. Candidal intertrigo  - start ketoconazole (NIZORAL) 2 % cream; Apply topically 2 (two) times daily To affected areas for 1-3 weeks.  Dispense: 60 g; Refill: 1  - rec keeping area dry, using hair dryer on cool setting to dry, powder, etc     2. Hypothyroidism (acquired)  - TSH  - T4, free    3. Neuropathic pain  - Start gabapentin (NEURONTIN) 300 MG capsule; Take 1 capsule (300 mg) by mouth nightly  Dispense: 30 capsule; Refill: 0    If pt develops shingles like rash on back instructed to RTC for follow up at that time      Return if symptoms worsen or fail to improve.    MEDICATIONS     Current Outpatient Medications   Medication Sig Dispense Refill    Ergocalciferol (VITAMIN D2 PO) Take by mouth daily      Lacosamide (Vimpat) 200 MG Tab Take by mouth 2 (two) times daily      levETIRAcetam (Keppra) 1000 MG tablet Take 1,500 mg by mouth 2 (two) times daily      levothyroxine (SYNTHROID) 100 MCG tablet Take 1 tablet (100 mcg) by mouth Once each week on Sunday morning 15 tablet 0    levothyroxine (SYNTHROID) 88 MCG tablet Take 1 tablet (88 mcg) by mouth Once a day at 6:00am      metoprolol succinate XL (TOPROL-XL) 25 MG 24 hr tablet Take 1 tablet (25 mg) by mouth daily      Omega-3 Fatty Acids (Omega-3 Fish Oil) 500 MG Cap Take by mouth daily      rosuvastatin (CRESTOR) 5 MG tablet Take 1 tablet (5 mg) by mouth every morning      triamterene-hydrochlorothiazide (DYAZIDE) 37.5-25 MG per capsule Take 1 capsule by mouth every morning      Ascorbic Acid (vitamin C, acerola,) 500 MG tablet Take 1 tablet (500 mg) by mouth daily      aspirin 325 MG tablet Take 1 tablet (325 mg) by mouth daily      clonazePAM (KlonoPIN) 1 MG tablet Take 1 tablet (1 mg) by mouth 2 (two) times daily      gabapentin (NEURONTIN) 300 MG capsule  Take 1 capsule (300 mg) by mouth nightly 30 capsule 0    ketoconazole (NIZORAL) 2 % cream Apply topically 2 (two) times daily To affected areas for 1-3 weeks. 60 g 1     No current facility-administered medications for this visit.       Allergies   Allergen Reactions    Ciprofloxacin Other (See Comments)     Confusion with the medication       SUBJECTIVE     Chief Complaint   Patient presents with    Rash     The has a c/o under left breast and arm           This is a 67 y.o. female with:    Rash under L breast and L armpit  -tried hydrocortisone and lotrimol cream. No help yet. At bra line. It is painful. Never got shingles vaccine. Onset has been for one week.     Notes some issues with numbness and tingling on back, no  rash noted. Tried husbands neurotin and this helped. Wondering if maybe she is going to get shingles.     Past Medical History:   Diagnosis Date    Abnormal vision     glasses    Arrhythmia 2018    SVT (had ablation)    Arthritis     Complication of anesthesia     urinary retention with every joint replacement, BP drop with spinal X1, another time spinal didn't work    Convulsions     epilepsy, last seizure 2010    Deep venous thrombosis of distal lower extremity 2018    after cholecystectomy; left leg    Encounter for blood transfusion 2018    Gastric ulceration 2018    GI bleed with requiring 4 units PRBCs per pt    Hypothyroidism        Social History     Socioeconomic History    Marital status: Married   Tobacco Use    Smoking status: Never     Passive exposure: Past    Smokeless tobacco: Never   Vaping Use    Vaping Use: Never used   Substance and Sexual Activity    Alcohol use: Yes     Comment: rarely- holidays    Drug use: Never    Sexual activity: Not Currently     Social Determinants of Health     Financial Resource Strain: Medium Risk (07/11/2022)    Overall Financial Resource Strain (CARDIA)     Difficulty of Paying Living Expenses: Somewhat hard   Food Insecurity: No Food Insecurity  (07/11/2022)    Hunger Vital Sign     Worried About Running Out of Food in the Last Year: Never true     Ran Out of Food in the Last Year: Never true   Transportation Needs: No Transportation Needs (07/11/2022)    PRAPARE - Therapist, art (Medical): No     Lack of Transportation (Non-Medical): No   Physical Activity: Insufficiently Active (07/11/2022)    Exercise Vital Sign     Days of Exercise per Week: 2 days     Minutes of Exercise per Session: 60 min   Stress: Stress Concern Present (07/11/2022)    [employer] of Occupational Health - Occupational Stress Questionnaire     Feeling of Stress : To some extent   Social Connections: Unknown (07/11/2022)    Social Connection and Isolation Panel [NHANES]     Frequency of Communication with Friends and Family: More than three times a week     Frequency of Social Gatherings with Friends and Family: More than three times a week     Attends Religious Services: Patient declined     Database administrator or Organizations: Patient declined     Attends Banker Meetings: Patient declined     Marital Status: Married   Catering manager Violence: Not At Risk (07/11/2022)    Humiliation, Afraid, Rape, and Kick questionnaire     Fear of Current or Ex-Partner: No     Emotionally Abused: No     Physically Abused: No     Sexually Abused: No   Housing Stability: Low Risk  (07/11/2022)    Housing Stability Vital Sign     Unable to Pay for Housing in the Last Year: No     Number of Places Lived in the Last Year: 1     Unstable Housing in the Last Year: No  Past Surgical History:   Procedure Laterality Date    ADENOIDECTOMY      ARTHROPLASTY, KNEE, TOTAL Left 02/12/2020    Procedure: LEFT ARTHROPLASTY, KNEE, TOTAL;  Surgeon: Harvie Bridge, MD;  Location: [facility];  Service: Orthopedics;  Laterality: Left;    BACK SURGERY  1973    lumbar    CARDIAC ABLATION  2018    SVT    CHOLECYSTECTOMY  2018    COLON SURGERY  2008    resection,  perforated colon with colonoscopy    COLONOSCOPY      JOINT REPLACEMENT      bilat hips, L 2005, R 2010, right knee 2015    OTHER SURGICAL HISTORY  2018    IVC filter    TONSILLECTOMY         Family History   Problem Relation Age of Onset    Cancer Mother          ROS     ROS as noted in HPI     The following sections were reviewed this encounter by the provider:   Tobacco  Allergies  Meds  Problems  Med Hx  Surg Hx  Fam Hx         OBJECTIVE/PHYSICAL EXAM     Vitals:    07/12/22 1054   BP: 112/74   BP Site: Right arm   Patient Position: Sitting   Cuff Size: Medium   Pulse: 71   Temp: 97.6 F (36.4 C)   TempSrc: Temporal   Weight: 108 kg (238 lb)   Height: 1.6 m (5\' 3" )     Body mass index is 42.16 kg/m.  No LMP recorded. Patient is postmenopausal.      Physical Exam  Constitutional:       Appearance: Normal appearance.   HENT:      Head: Normocephalic and atraumatic.      Right Ear: External ear normal.      Left Ear: External ear normal.      Nose: Nose normal.      Mouth/Throat:      Pharynx: No posterior oropharyngeal erythema.   Pulmonary:      Effort: Pulmonary effort is normal. No respiratory distress.   Musculoskeletal:         General: Normal range of motion.   Skin:     Comments: Beefy red erythema noted under L breast in skin fold and L armpit skin fold. No rash on back    Neurological:      General: No focal deficit present.      Mental Status: She is alert.   Psychiatric:         Mood and Affect: Mood normal.           Signed,  Edison Simon, DO    This note was generated by the Epic EMR system/ Dragon speech recognition and may contain inherent errors or omissions not intended by the user. Grammatical errors, random word insertions, deletions and pronoun errors  are occasional consequences of this technology due to software limitations. Not all errors are caught or corrected. If there are questions or concerns about the content of this note or information contained within the body of this dictation  they should be addressed directly with the author for clarification.

## 2022-07-16 ENCOUNTER — Other Ambulatory Visit: Payer: Medicare Other

## 2022-07-24 ENCOUNTER — Other Ambulatory Visit (INDEPENDENT_AMBULATORY_CARE_PROVIDER_SITE_OTHER): Payer: Self-pay | Admitting: Student in an Organized Health Care Education/Training Program

## 2022-07-24 ENCOUNTER — Other Ambulatory Visit (INDEPENDENT_AMBULATORY_CARE_PROVIDER_SITE_OTHER): Payer: Self-pay | Admitting: Family Medicine

## 2022-07-24 DIAGNOSIS — M792 Neuralgia and neuritis, unspecified: Secondary | ICD-10-CM

## 2022-07-24 DIAGNOSIS — B372 Candidiasis of skin and nail: Secondary | ICD-10-CM

## 2022-07-24 DIAGNOSIS — E039 Hypothyroidism, unspecified: Secondary | ICD-10-CM

## 2022-07-24 NOTE — Telephone Encounter (Signed)
Patient called in and wants to know if she can take gabapentin twice a day instead of once. Please advise

## 2022-07-25 ENCOUNTER — Telehealth (INDEPENDENT_AMBULATORY_CARE_PROVIDER_SITE_OTHER): Payer: Self-pay | Admitting: Family Medicine

## 2022-07-25 NOTE — Telephone Encounter (Signed)
Pt is calling to request a PCP change to Dr. Jomarie Longs

## 2022-07-26 ENCOUNTER — Other Ambulatory Visit (INDEPENDENT_AMBULATORY_CARE_PROVIDER_SITE_OTHER): Payer: Self-pay | Admitting: Family Medicine

## 2022-07-26 DIAGNOSIS — E039 Hypothyroidism, unspecified: Secondary | ICD-10-CM

## 2022-07-26 MED ORDER — LEVOTHYROXINE SODIUM 100 MCG PO TABS
100.0000 ug | ORAL_TABLET | ORAL | 0 refills | Status: DC
Start: 2022-07-29 — End: 2022-08-13

## 2022-07-26 MED ORDER — LEVOTHYROXINE SODIUM 88 MCG PO TABS
88.0000 ug | ORAL_TABLET | Freq: Every day | ORAL | 0 refills | Status: DC
Start: 2022-07-26 — End: 2022-11-07

## 2022-07-29 MED ORDER — GABAPENTIN 300 MG PO CAPS
300.0000 mg | ORAL_CAPSULE | Freq: Two times a day (BID) | ORAL | 0 refills | Status: DC
Start: 2022-07-29 — End: 2022-10-29

## 2022-08-09 ENCOUNTER — Other Ambulatory Visit (INDEPENDENT_AMBULATORY_CARE_PROVIDER_SITE_OTHER): Payer: Self-pay | Admitting: Family Medicine

## 2022-08-09 ENCOUNTER — Telehealth (INDEPENDENT_AMBULATORY_CARE_PROVIDER_SITE_OTHER): Payer: Self-pay | Admitting: Family Medicine

## 2022-08-09 DIAGNOSIS — E039 Hypothyroidism, unspecified: Secondary | ICD-10-CM

## 2022-08-09 NOTE — Telephone Encounter (Signed)
Patient is calling to schedule an appointment for the lab but there are no lab orders in the chart.     Patient states that she received a message from her PCP advising that she needed to get blood work redrawn 3 weeks after last blood draw.     Thanks.

## 2022-08-09 NOTE — Telephone Encounter (Signed)
Name, strength, directions of requested refill(s):    triamterene-hydrochlorothiazide (DYAZIDE) 37.5-25 MG per capsule     How much medication is remaining: Patient has 1 week of medication remaining.     Pharmacy to send refill to or patient to pick up rx from office (mark requested pharmacy in BOLD):      [facility] [place] - [place]  [place]  [address] [place] [address]  Phone: [phone] Fax: [phone]        Please mark "X" next to the preferred call back number:    Mobile: [phone] (mobile) X   Home: [phone] (home)    Work: '@WORKPHONE'$ @        Medication refill request, see above. Thank you     Additional Notes: N/A  Next Visit: 5.23.24

## 2022-08-10 ENCOUNTER — Other Ambulatory Visit (INDEPENDENT_AMBULATORY_CARE_PROVIDER_SITE_OTHER): Payer: Self-pay | Admitting: Family Medicine

## 2022-08-10 DIAGNOSIS — E039 Hypothyroidism, unspecified: Secondary | ICD-10-CM

## 2022-08-10 NOTE — Telephone Encounter (Signed)
Pt is requesting a refill for the following medication:    triamterene-hydrochlorothiazide (DYAZIDE) 37.5-25 MG per capsule     [facility][phone] - [place], [place]

## 2022-08-12 ENCOUNTER — Other Ambulatory Visit (INDEPENDENT_AMBULATORY_CARE_PROVIDER_SITE_OTHER): Payer: Self-pay | Admitting: Family Medicine

## 2022-08-12 DIAGNOSIS — E039 Hypothyroidism, unspecified: Secondary | ICD-10-CM

## 2022-08-13 MED ORDER — TRIAMTERENE-HCTZ 37.5-25 MG PO CAPS
1.0000 | ORAL_CAPSULE | Freq: Every morning | ORAL | 0 refills | Status: DC
Start: 2022-08-13 — End: 2023-01-21

## 2022-08-14 ENCOUNTER — Other Ambulatory Visit: Payer: Medicare Other

## 2022-08-24 ENCOUNTER — Other Ambulatory Visit (INDEPENDENT_AMBULATORY_CARE_PROVIDER_SITE_OTHER): Payer: Self-pay | Admitting: Family Medicine

## 2022-08-24 DIAGNOSIS — E039 Hypothyroidism, unspecified: Secondary | ICD-10-CM

## 2022-10-05 ENCOUNTER — Ambulatory Visit (INDEPENDENT_AMBULATORY_CARE_PROVIDER_SITE_OTHER): Payer: Medicare Other | Admitting: Family Medicine

## 2022-10-15 ENCOUNTER — Ambulatory Visit (INDEPENDENT_AMBULATORY_CARE_PROVIDER_SITE_OTHER): Payer: Medicare Other | Admitting: Internal Medicine

## 2022-10-15 ENCOUNTER — Encounter (INDEPENDENT_AMBULATORY_CARE_PROVIDER_SITE_OTHER): Payer: Self-pay | Admitting: Internal Medicine

## 2022-10-15 VITALS — BP 119/75 | HR 65 | Temp 98.2°F | Wt 241.0 lb

## 2022-10-15 DIAGNOSIS — E785 Hyperlipidemia, unspecified: Secondary | ICD-10-CM

## 2022-10-15 DIAGNOSIS — Z1159 Encounter for screening for other viral diseases: Secondary | ICD-10-CM

## 2022-10-15 DIAGNOSIS — G40909 Epilepsy, unspecified, not intractable, without status epilepticus: Secondary | ICD-10-CM

## 2022-10-15 DIAGNOSIS — R7309 Other abnormal glucose: Secondary | ICD-10-CM

## 2022-10-15 DIAGNOSIS — Z7689 Persons encountering health services in other specified circumstances: Secondary | ICD-10-CM

## 2022-10-15 DIAGNOSIS — M19071 Primary osteoarthritis, right ankle and foot: Secondary | ICD-10-CM

## 2022-10-15 DIAGNOSIS — E039 Hypothyroidism, unspecified: Secondary | ICD-10-CM

## 2022-10-15 DIAGNOSIS — I471 Supraventricular tachycardia, unspecified: Secondary | ICD-10-CM

## 2022-10-15 DIAGNOSIS — Z1231 Encounter for screening mammogram for malignant neoplasm of breast: Secondary | ICD-10-CM

## 2022-10-15 NOTE — Progress Notes (Signed)
Subjective:      Patient ID: Jennifer [name] is a 67 y.o. female     Chief Complaint   Patient presents with    Establish Care        HPI   pt is here to establish care    Seizure d/o  2010 sees  neuro, Dr. Marcha Dutton, on keppra and vimpat, and clonazepam  Last episode of seizure petit mal 4 wks ago,   Last tonic-clonic sz 2010.     Hypothyroidism, x 30 yrs, compliant with med, weight stable, BM ok . Plans to go back on diet program for weight control     Lab Results   Component Value Date    TSH 4.55 07/12/2022      SVT, s/p ablation, on metoprolol   Sees card , Dr. Aneta Mins, no palp,   No cp, no sob.     Hx colon perforation 2009, s/p 14in partial colectomy.   Last Cscope 2019, reported normal   No fam hx colon CA.     Last mammogram 2023  Last PAP smear 1/23. Normal  Last DEXA 2023 normal    Dyazide, started yrs ago for headache, no hx HTN,     Abnormal glucose, diet controlled  Lab Results   Component Value Date    HGBA1C 5.2 02/04/2020      Hyperlipidemia, x 4 yrs, tolerating med, no myalgia,     Lab Results   Component Value Date    CHOL 185 04/06/2022     Lab Results   Component Value Date    HDL 44 04/06/2022     Lab Results   Component Value Date    LDL 92 04/06/2022     Lab Results   Component Value Date    TRIG 245 (H) 04/06/2022       The following sections were reviewed this encounter by the provider:   Tobacco  Allergies  Meds  Problems  Med Hx  Surg Hx  Fam Hx  Soc Hx          Review of Systems   Constitutional:  Negative for appetite change, chills, fever and unexpected weight change.   Eyes:  Negative for visual disturbance.   Respiratory:  Negative for shortness of breath.    Cardiovascular:  Negative for chest pain and palpitations.   Gastrointestinal:  Negative for abdominal pain, nausea and vomiting.   Genitourinary:  Negative for dysuria.   Musculoskeletal:  Negative for myalgias.        Right ankle pain, chronic, previous xray signif arthritis ,   Pt requests referral to ortho    Neurological:  Negative for syncope, speech difficulty, weakness, light-headedness, numbness and headaches.          BP 119/75 (BP Site: Left arm, Patient Position: Sitting)   Pulse 65   Temp 98.2 F (36.8 C) (Temporal)   Wt 109.3 kg (241 lb)   BMI 42.69 kg/m     Objective:     Physical Exam  Vitals reviewed.   Constitutional:       General: She is not in acute distress.     Appearance: She is well-developed. She is obese.   HENT:      Right Ear: Tympanic membrane normal.      Left Ear: Tympanic membrane normal.   Eyes:      General: No scleral icterus.     Conjunctiva/sclera: Conjunctivae normal.   Neck:      Thyroid: No thyromegaly.  Vascular: No carotid bruit or JVD.   Cardiovascular:      Rate and Rhythm: Normal rate and regular rhythm.      Heart sounds: Normal heart sounds. No murmur heard.     No friction rub. No gallop.   Pulmonary:      Effort: Pulmonary effort is normal. No respiratory distress.      Breath sounds: Normal breath sounds. No rales.   Abdominal:      General: Bowel sounds are normal. There is no distension.      Palpations: Abdomen is soft.      Tenderness: There is no abdominal tenderness. There is no guarding or rebound.   Musculoskeletal:         General: No swelling.      Cervical back: Neck supple.      Right lower leg: No edema.      Left lower leg: No edema.      Comments: Reduced right ankle ROM, no signif tenderness.    Neurological:      General: No focal deficit present.      Mental Status: She is alert.      Motor: No weakness.          Assessment:     1. Seizure disorder    2. Arthritis of right ankle  - Referral to [facility] ([place]); Future    3. Hyperlipidemia LDL goal <100  - Comprehensive metabolic panel; Future  - Lipid panel; Future    4. Abnormal glucose  - Hemoglobin A1C; Future    5. Hypothyroidism (acquired)    6. SVT (supraventricular tachycardia)    7. Encounter for screening mammogram for malignant neoplasm of breast  - Mammo Digital 2D  Screening Bilateral W CAD; Future    8. Need for hepatitis C screening test  - Hepatitis C (HCV) antibody, Total; Future    9. Encounter to establish care        Plan:     Continue meds  Mammogram ordered  Referred pt to ortho for evaluation   Low carb, low chol, low calorie diet, weight reduction   Obtain shingrix, covid vaccine at local pharmacy  Further recommendations pending test results.  F/u 6 months,     Excell Seltzer, MD

## 2022-10-18 ENCOUNTER — Ambulatory Visit (INDEPENDENT_AMBULATORY_CARE_PROVIDER_SITE_OTHER): Payer: Medicare Other | Admitting: Internal Medicine

## 2022-10-19 ENCOUNTER — Other Ambulatory Visit (FREE_STANDING_LABORATORY_FACILITY): Payer: Medicare Other

## 2022-10-19 DIAGNOSIS — R7309 Other abnormal glucose: Secondary | ICD-10-CM

## 2022-10-19 DIAGNOSIS — Z1159 Encounter for screening for other viral diseases: Secondary | ICD-10-CM

## 2022-10-19 DIAGNOSIS — E785 Hyperlipidemia, unspecified: Secondary | ICD-10-CM

## 2022-10-19 LAB — HEMOLYSIS INDEX(SOFT): Hemolysis Index: 0 Index (ref 0–24)

## 2022-10-19 LAB — HEPATITIS C ANTIBODY, TOTAL: Hepatitis C, AB: NONREACTIVE

## 2022-10-19 LAB — COMPREHENSIVE METABOLIC PANEL
ALT: 16 U/L (ref 0–55)
AST (SGOT): 13 U/L (ref 5–41)
Albumin/Globulin Ratio: 1.3 (ref 0.9–2.2)
Albumin: 3.5 g/dL (ref 3.5–5.0)
Alkaline Phosphatase: 47 U/L (ref 37–117)
Anion Gap: 11 (ref 5.0–15.0)
BUN: 18 mg/dL (ref 7.0–21.0)
Bilirubin, Total: 0.3 mg/dL (ref 0.2–1.2)
CO2: 26 mEq/L (ref 17–29)
Calcium: 9.6 mg/dL (ref 8.5–10.5)
Chloride: 105 mEq/L (ref 99–111)
Creatinine: 0.8 mg/dL (ref 0.4–1.0)
Globulin: 2.7 g/dL (ref 2.0–3.6)
Glucose: 116 mg/dL — ABNORMAL HIGH (ref 70–100)
Potassium: 4.2 mEq/L (ref 3.5–5.3)
Protein, Total: 6.2 g/dL (ref 6.0–8.3)
Sodium: 142 mEq/L (ref 135–145)
eGFR: >60 mL/min/{1.73_m2} (ref >=60)

## 2022-10-19 LAB — LIPID PANEL
Cholesterol / HDL Ratio: 4.3 Index
Cholesterol: 173 mg/dL (ref 0–199)
HDL: 40 mg/dL (ref 40–9999)
LDL Calculated: 89 mg/dL (ref 0–99)
Triglycerides: 219 mg/dL — ABNORMAL HIGH (ref 34–149)
VLDL Calculated: 44 mg/dL — ABNORMAL HIGH (ref 10–40)

## 2022-10-19 LAB — HEMOGLOBIN A1C
Average Estimated Glucose: 122.6 mg/dL
Hemoglobin A1C: 5.9 % — ABNORMAL HIGH (ref 4.6–5.6)

## 2022-10-20 NOTE — Progress Notes (Signed)
Glucose/Hba1c are borderline high consistent with pre-diabetes  Cholesterol profile triglycerides level is mildly high, HDL is good, LDL is good  Hepatitis C screening is normal  Rest of labs are normal    Rec:  1. Low carb, low chol, low calorie diet, exercise, weight reduction 2. Continue meds  3. Keep follow up visit

## 2022-10-26 ENCOUNTER — Encounter (INDEPENDENT_AMBULATORY_CARE_PROVIDER_SITE_OTHER): Payer: Self-pay | Admitting: Internal Medicine

## 2022-10-28 ENCOUNTER — Other Ambulatory Visit (INDEPENDENT_AMBULATORY_CARE_PROVIDER_SITE_OTHER): Payer: Self-pay | Admitting: Student in an Organized Health Care Education/Training Program

## 2022-10-28 DIAGNOSIS — M792 Neuralgia and neuritis, unspecified: Secondary | ICD-10-CM

## 2022-10-29 ENCOUNTER — Encounter (INDEPENDENT_AMBULATORY_CARE_PROVIDER_SITE_OTHER): Payer: Self-pay

## 2022-10-29 ENCOUNTER — Other Ambulatory Visit (INDEPENDENT_AMBULATORY_CARE_PROVIDER_SITE_OTHER): Payer: Self-pay | Admitting: Internal Medicine

## 2022-10-29 DIAGNOSIS — M792 Neuralgia and neuritis, unspecified: Secondary | ICD-10-CM

## 2022-10-29 MED ORDER — GABAPENTIN 300 MG PO CAPS
300.0000 mg | ORAL_CAPSULE | Freq: Two times a day (BID) | ORAL | 3 refills | Status: DC | PRN
Start: 2022-10-29 — End: 2022-12-26

## 2022-11-06 ENCOUNTER — Encounter (INDEPENDENT_AMBULATORY_CARE_PROVIDER_SITE_OTHER): Payer: Self-pay | Admitting: Internal Medicine

## 2022-11-07 ENCOUNTER — Other Ambulatory Visit (INDEPENDENT_AMBULATORY_CARE_PROVIDER_SITE_OTHER): Payer: Self-pay | Admitting: Family Medicine

## 2022-11-07 MED ORDER — METOPROLOL SUCCINATE ER 25 MG PO TB24
25.0000 mg | ORAL_TABLET | Freq: Every day | ORAL | 0 refills | Status: DC
Start: 2022-11-07 — End: 2023-01-16

## 2022-11-28 ENCOUNTER — Ambulatory Visit
Admission: RE | Admit: 2022-11-28 | Discharge: 2022-11-28 | Disposition: A | Discharge status: Home / Self Care | Payer: Medicare Other | Source: Ambulatory Visit | Attending: Internal Medicine | Admitting: Internal Medicine

## 2022-11-28 ENCOUNTER — Other Ambulatory Visit (INDEPENDENT_AMBULATORY_CARE_PROVIDER_SITE_OTHER): Payer: Self-pay | Admitting: Internal Medicine

## 2022-11-28 DIAGNOSIS — Z1231 Encounter for screening mammogram for malignant neoplasm of breast: Secondary | ICD-10-CM | POA: Insufficient documentation

## 2022-11-28 DIAGNOSIS — R7309 Other abnormal glucose: Secondary | ICD-10-CM

## 2022-11-28 DIAGNOSIS — I471 Supraventricular tachycardia, unspecified: Secondary | ICD-10-CM

## 2022-11-28 DIAGNOSIS — Z7689 Persons encountering health services in other specified circumstances: Secondary | ICD-10-CM

## 2022-11-28 DIAGNOSIS — Z1159 Encounter for screening for other viral diseases: Secondary | ICD-10-CM

## 2022-11-28 DIAGNOSIS — E785 Hyperlipidemia, unspecified: Secondary | ICD-10-CM

## 2022-11-28 DIAGNOSIS — M19071 Primary osteoarthritis, right ankle and foot: Secondary | ICD-10-CM

## 2022-11-28 DIAGNOSIS — G40909 Epilepsy, unspecified, not intractable, without status epilepticus: Secondary | ICD-10-CM

## 2022-11-28 DIAGNOSIS — E039 Hypothyroidism, unspecified: Secondary | ICD-10-CM

## 2022-12-01 NOTE — Progress Notes (Signed)
Mammogram is normal    Rec:  1. Continue routine annual screening

## 2022-12-05 NOTE — Progress Notes (Signed)
ASSESSMENT:    ICD-10-CM   1. Meralgia paraesthetica, right  G57.11   2. DDD (degenerative disc disease), lumbar  M51.36   3. Presence of right artificial hip joint  Z96.641     Patient Active Problem List   Diagnosis   . Anemia   . Degenerative arthritis of hip   . Health care maintenance   . Hypercholesterolemia   . Hypothyroidism   . Migraine   . Nonintractable epilepsy with complex partial seizures   . Status post total right knee replacement   . Arthritis of knee, right   . Primary osteoarthritis of left knee     PLAN:  I reviewed the patient's X-Rays with her today and answered all questions and concerns regarding the results.  I informed [name] that according to the radiographic imaging taken today the prosthetic is in good position but is starting to show early polyethylene creep. I don't believe this is causing her pain.  Based on my exam, I feel that her numbness is due to meralgia paraesthetica on her right side aggravated by BMI.  I advised [name] to see a neurologist for further evaluation.  In the interim I advised the patient to avoid tight fitting abdominal clothing and avoiding postures that place pressure in the affected area.  The benefits of weight loss in reducing symptoms was also discussed.  F/U PRN    Treatment Plan:   Orders Placed This Encounter   . X-ray hip right 2-3 views w/pelvis when performed (16109)   . BMI Patient Education   . BP Patient Education     Follow-up: Return if symptoms worsen or fail to improve.    HISTORY OF PRESENT ILLNESS:  Jennifer [name]; [name]  Age: 67 y.o. Sex: female    Chief Complaint: Pain of the Right Hip (3 months ago pain started in the groin and goes into the back of the hip. Feels a burning sensation. Hip was replaced in 2013. )    History of present illness: [name] is a pleasant 67 y.o. female who presents today for an evaluation of her Right hip. Hx of THA done 2013 in [place]. She reports that she has been having  groin pain and a burning sensation on her lateral thigh that radiates down her leg.  Some superficial numbness on her lateral thigh. The pain is worse when she bears weight on the leg. Getting up at night is particularly hard.  This has been ongoing for the last 3 months.      Assistive device: none    Pain score:7  out of 10    Patient Care Team:  Myrtie Neither, MD as PCP - General (Internal Medicine)    Review of Systems   12/07/2022    Constitutional: Unexplained: Positive  Genitourinary: Frequent Urination: Negative  HEENT: Vision Loss: Negative  Neurological: Memory Loss: Negative  Integumentary: Rash: Negative  Cardiovascular: Palpatations: Negative  Hematologic: Bruises/Bleeds Easily: Negative  Gastrointestinal: Constipation: Negative  Immunological: Seasonal Allergies: Negative  Musculoskeletal: Joint Pain: Positive    OBJECTIVE:  Constitutional:  No acute distress. Her body mass index is 41.98 kg/m.   Eyes:  Sclera are nonicteric.  Respiratory:  No labored breathing.  Cardiovascular:  No marked edema.  Skin:  No marked skin ulcers.  Neurological:  No marked sensory loss noted.  Psychiatric: Alert and oriented x3.  Musculoskeletal:    RIGHT Hip Exam    Leg length discrepancy: none  ROM:   Flexion:  full  Extension: full  IR: full   ER: full   Abduction: full degrees  Pedal Pulses N/A  Gait: antalgic  Tenderness:   Greater Trochanter: -  Posterior Hip: -  Inguinal: -  Stinchfield: -  Log Roll: -  Neurovascular Exam: Superficial numbness lateral thigh    IMAGING / STUDIES:   Order: XR HIP RIGHT 2-3 VIEWS W/ PELVIS WHEN PERFORMED - Indication:   Presence of right artificial hip joint       3 views of the patient's right hip were obtained in the office today.  Patient has an uncemented Corail stem in place.  Some heterotopic bone iliopsoas tendon noted.  Implant is well-fixed.  Acetabulum shows no evidence of complication.  Minimal polyethylene creep is noted.  Lumbar degenerative disc disease noted incidentally at  the lower levels.    PROCEDURES:  Procedures    I, Franciso Bend, MD, personally, performed the services described in this documentation, as scribed in my presence, and it is both accurate and complete.  Scribed by: Okey Regal

## 2022-12-11 ENCOUNTER — Telehealth (INDEPENDENT_AMBULATORY_CARE_PROVIDER_SITE_OTHER): Payer: Self-pay | Admitting: Internal Medicine

## 2022-12-11 NOTE — Telephone Encounter (Signed)
Pt is calling inquiring about a gastric by pass and/or a lap band. Please advise. Thank you!

## 2022-12-17 ENCOUNTER — Telehealth (INDEPENDENT_AMBULATORY_CARE_PROVIDER_SITE_OTHER): Payer: Self-pay | Admitting: Surgery

## 2022-12-17 ENCOUNTER — Encounter (INDEPENDENT_AMBULATORY_CARE_PROVIDER_SITE_OTHER): Payer: Self-pay | Admitting: Internal Medicine

## 2022-12-17 ENCOUNTER — Ambulatory Visit (INDEPENDENT_AMBULATORY_CARE_PROVIDER_SITE_OTHER): Payer: Medicare Other | Admitting: Internal Medicine

## 2022-12-17 VITALS — BP 115/61 | HR 61 | Ht 63.0 in | Wt 245.4 lb

## 2022-12-17 DIAGNOSIS — Z6841 Body Mass Index (BMI) 40.0 and over, adult: Secondary | ICD-10-CM

## 2022-12-17 DIAGNOSIS — G5711 Meralgia paresthetica, right lower limb: Secondary | ICD-10-CM

## 2022-12-17 NOTE — Progress Notes (Signed)
Have you seen any specialists/other providers since your last visit with [place]?        The patient is due for   Health Maintenance Due   Topic Date Due    Advance Directive on File  Never done    Medicare Annual Wellness Visit  Never done    Shingrix Vaccine 50+ (1) Never done    COVID-19 Vaccine (6 - 2023-24 season) 06/18/2022

## 2022-12-17 NOTE — Telephone Encounter (Signed)
BENEFIT DETERMINATION ENCOUNTER WITH INSURANCE    Insurance Disclaimer: "A quote of benefits and/or authorization does not guarantee payment or verify eligibility. Payment of benefits are subject to all terms, conditions, limitations, and exclusions of the member's contract at time of service."    REFERENCE NUMBER:  REPRESENTATIVE:     PRIMARY:  MEDICARE PART A AND B    SURGEON: CUL OFFICE: LOR DATE OF CONSULT:    12/26/2022     MEMBER INFORMATION:  Jennifer [name]    CARRIER  MEDICARE PART A AND B   INSURANCE ID [id number]   SUBSCRIBER NAME [name], [name]   DOB 12-28-1955   STATUS ACTIVE   RENEWS      EMPLOYER         IN NETWORK STATUS OFFICE? Y       IN NETWORK STATUS PROVIDER? Y     LIMIT  ACCUMILATION  MET/NOT MET    INDIVIDUAL DEDUCTIBLE       INDIVIDUAL OUT OF POCKET       FAMILY DEDUCTIBLE       FAMILY OUT OF POCKET         ______________________________________________________________________    BENEFITS YES/NO    MORBID OBESITY YES           Specialist Copay: Deductibles:  Co-Insurance:           ____________________________________________________________________  BARIATRIC SURGERY COVERAGE DETAILS FOR INPATIENT  [phone]  DX: E66.01   CONTRACT  GUIDELINES/EXCLUSIONS:     OTHER INDICATED CRITERIA/GUIDELINES:     PRIOR AUTHORIZATION REQUIRED:     PRIOR AUTH CONTACT INFORMATION:     NUTRITION PROGRAM REQUIREMENT:  3 MONTHS    6 MONTHS NICOTINE FREE REQUIREMENT FOR BCBS CAPITAL BLUE, GEHA, FED BCBS    BRS ENROLLEMENT REQUIRED: Tel:[phone] (43-Month Program Required): n/a    AHP ENROLLEMENT REQUIRED:Tel: [phone] (64-Month Program Required): n/a    INPATIENT COPAY:    CO-INSURANCE:     DEDUCTIBLE:     _______________________________________________________________________  NUTRITION COUNSELING BENEFITS    Nutrition Counseling Benefits? YES Nutrition Copayment: Co-Insurance:       Visits: FOR DIABETES ONLY Used:       Deductible:        [id number]  (MEDICAL NUTRITION INITIAL INDIVIDUAL)   E66.01(MORBID OBESITY)    [phone]  (MEDICAL NUTRITION, SUBSEQUENT, INDIVIDUAL)  E66.9(BMI CLASS II W/CO-MORBIDITY)    [id number]  (MEDICAL NUTRITION GROUP)  Z71.3(DIETARY COUNSELING)    [phone]  (MEDICAL NUTRITION INITIAL, INDIVIDUAL, RE-ASSESSMENT)  Z98.84(S/P) BARIATRIC SURGERY    [id number]  (MEDICAL NUTRITION GROUP, RE-ASSESSMENT)  Prior auth required    [id number]  (GASTRID BAND ADJUSTMENT: DX: Z46.51)  TEL:                         FAX:    ________________________________________________________________________  TELEHEALTH BENEFITS INCLUDING VIRTUAL/PHONE ENCOUNTERS       Benefits? YES BASED ON MEDICAL NECESSITY  Co-Insurance:      Deductible:

## 2022-12-17 NOTE — Progress Notes (Signed)
Subjective:      Patient ID: Jennifer [name] is a 67 y.o. female     Chief Complaint   Patient presents with    Obesity     Pt would like to discuss referral to bariatrics         HPI Pt c/o     Meralgia paresthetica, burning pain right lateral thigh area,  saw neuro , gabapentin dose increased 600mg  2-3x/day with improvement    Obesity, tried meds, exercise, diet, not effective, interested in bariatrics surgery.        The following sections were reviewed this encounter by the provider:   Allergies  Meds         Review of Systems   Constitutional:  Negative for appetite change and unexpected weight change.   Respiratory:  Negative for shortness of breath.    Cardiovascular:  Negative for chest pain and palpitations.   Gastrointestinal:  Negative for abdominal pain, nausea and vomiting.   Genitourinary:  Negative for dysuria.   Musculoskeletal:  Negative for myalgias.   Neurological:  Negative for syncope, light-headedness and headaches.          BP 115/61 (BP Site: Left arm, Patient Position: Sitting, Cuff Size: Large)   Pulse 61   Ht 1.6 m (5\' 3" )   Wt 111.3 kg (245 lb 6.4 oz)   BMI 43.47 kg/m     Objective:     Physical Exam  Vitals reviewed.   Constitutional:       General: She is not in acute distress.     Appearance: She is well-developed. She is obese.   Neck:      Thyroid: No thyromegaly.      Vascular: No carotid bruit or JVD.   Cardiovascular:      Rate and Rhythm: Normal rate and regular rhythm.      Heart sounds: Normal heart sounds. No murmur heard.     No friction rub. No gallop.   Pulmonary:      Effort: Pulmonary effort is normal. No respiratory distress.      Breath sounds: Normal breath sounds. No rales.   Musculoskeletal:      Cervical back: Neck supple.   Neurological:      Mental Status: She is alert.          Assessment:     1. Class 3 severe obesity with serious comorbidity and body mass index (BMI) of 40.0 to 44.9 in adult, unspecified obesity type  - Referral to Bariatric Surgery  and Obesity Medicine ([place]); Future    2. Meralgia paresthetica of right side        Plan:     Continue meds  Referred pt to bariatrics service for weight management/surgical option.   Keep routine f/u ov.     Excell Seltzer, MD

## 2022-12-17 NOTE — Telephone Encounter (Signed)
BENEFIT DETERMINATION ENCOUNTER WITH INSURANCE    Insurance Disclaimer: "A quote of benefits and/or authorization does not guarantee payment or verify eligibility. Payment of benefits are subject to all terms, conditions, limitations, and exclusions of the member's contract at time of service."    REFERENCE NUMBER:  REPRESENTATIVE:      SECONDARY:  [employer] BCBS PPO SUPPLEMENTAL TO MEDICARE  COVERS BALANCE AFTER MEDICAID PAYS  SURGEON: CUL OFFICE:  LOR DATE OF CONSULT:    12/26/2022     MEMBER INFORMATION: Jennifer [name]    CARRIER  [employer] BCBS PPO SUPPLEMENT    INSURANCE ID [id number]   SUBSCRIBER NAME [name], [name]   DOB 1956/03/27   STATUS ACTIVE   RENEWS      EMPLOYER         IN NETWORK STATUS OFFICE?        IN NETWORK STATUS PROVIDER?     LIMIT  ACCUMILATION  MET/NOT MET    INDIVIDUAL DEDUCTIBLE       INDIVIDUAL OUT OF POCKET       FAMILY DEDUCTIBLE       FAMILY OUT OF POCKET         ______________________________________________________________________    BENEFITS YES/NO    MORBID OBESITY            Specialist Copay: Deductibles:  Co-Insurance:           ____________________________________________________________________  BARIATRIC SURGERY COVERAGE DETAILS FOR INPATIENT  [phone]  DX: E66.01   CONTRACT  GUIDELINES/EXCLUSIONS:     OTHER INDICATED CRITERIA/GUIDELINES:     PRIOR AUTHORIZATION REQUIRED:     PRIOR AUTH CONTACT INFORMATION:     NUTRITION PROGRAM REQUIREMENT:     6 MONTHS NICOTINE FREE REQUIREMENT FOR BCBS CAPITAL BLUE, GEHA, FED BCBS    BRS ENROLLEMENT REQUIRED: [phone] (37-Month Program Required): n/a    AHP ENROLLEMENT REQUIRED:Tel: [phone] (73-Month Program Required): n/a    INPATIENT COPAY:    CO-INSURANCE:     DEDUCTIBLE:     _______________________________________________________________________  NUTRITION COUNSELING BENEFITS    Nutrition Counseling Benefits?  Nutrition Copayment: Co-Insurance:       Visits: Used:       Deductible:        [id number]  (MEDICAL NUTRITION  INITIAL INDIVIDUAL)  E66.01(MORBID OBESITY)    [phone]  (MEDICAL NUTRITION, SUBSEQUENT, INDIVIDUAL)  E66.9(BMI CLASS II W/CO-MORBIDITY)    [id number]  (MEDICAL NUTRITION GROUP)  Z71.3(DIETARY COUNSELING)    [phone]  (MEDICAL NUTRITION INITIAL, INDIVIDUAL, RE-ASSESSMENT)  Z98.84(S/P) BARIATRIC SURGERY    [id number]  (MEDICAL NUTRITION GROUP, RE-ASSESSMENT)  Prior auth required    [id number]  (GASTRID BAND ADJUSTMENT: DX: Z46.51)  TEL:                         FAX:    ________________________________________________________________________  TELEHEALTH BENEFITS INCLUDING VIRTUAL/PHONE ENCOUNTERS       Benefits?   Co-Insurance:      Deductible:

## 2022-12-18 ENCOUNTER — Encounter (INDEPENDENT_AMBULATORY_CARE_PROVIDER_SITE_OTHER): Payer: Self-pay

## 2022-12-26 ENCOUNTER — Encounter (INDEPENDENT_AMBULATORY_CARE_PROVIDER_SITE_OTHER): Payer: Self-pay | Admitting: Surgery

## 2022-12-26 ENCOUNTER — Ambulatory Visit (INDEPENDENT_AMBULATORY_CARE_PROVIDER_SITE_OTHER): Payer: Medicare Other | Admitting: Surgery

## 2022-12-26 ENCOUNTER — Encounter (INDEPENDENT_AMBULATORY_CARE_PROVIDER_SITE_OTHER): Payer: Self-pay

## 2022-12-26 VITALS — BP 141/81 | HR 92 | Ht 62.0 in | Wt 248.0 lb

## 2022-12-26 DIAGNOSIS — Z6841 Body Mass Index (BMI) 40.0 and over, adult: Secondary | ICD-10-CM | POA: Insufficient documentation

## 2022-12-26 DIAGNOSIS — E039 Hypothyroidism, unspecified: Secondary | ICD-10-CM

## 2022-12-26 DIAGNOSIS — R7303 Prediabetes: Secondary | ICD-10-CM | POA: Insufficient documentation

## 2022-12-26 DIAGNOSIS — Z8759 Personal history of other complications of pregnancy, childbirth and the puerperium: Secondary | ICD-10-CM

## 2022-12-26 DIAGNOSIS — M5137 Other intervertebral disc degeneration, lumbosacral region: Secondary | ICD-10-CM | POA: Insufficient documentation

## 2022-12-26 DIAGNOSIS — G40909 Epilepsy, unspecified, not intractable, without status epilepticus: Secondary | ICD-10-CM | POA: Insufficient documentation

## 2022-12-26 DIAGNOSIS — Z86718 Personal history of other venous thrombosis and embolism: Secondary | ICD-10-CM

## 2022-12-26 DIAGNOSIS — I471 Supraventricular tachycardia, unspecified: Secondary | ICD-10-CM | POA: Insufficient documentation

## 2022-12-26 DIAGNOSIS — E781 Pure hyperglyceridemia: Secondary | ICD-10-CM

## 2022-12-26 DIAGNOSIS — Z9049 Acquired absence of other specified parts of digestive tract: Secondary | ICD-10-CM | POA: Insufficient documentation

## 2022-12-26 NOTE — Progress Notes (Signed)
Drs. Fermin Schwab, [place], [place], and Amarah Brossman  [address]   [place], [place] [address]   [phone]   [phone]    [address]   [place], [place] [address]   [phone]   [phone]       New Patient Consult  Assessment:  Morbid obesity, BMI 45  Seizure disorder  SVT s/p ablation  Hypertriglyceridemia  Prediabetes  Hypothyroidism  DDD  S/p hip and knee replacements  Hx diverticulitis  Hx colon resection due to perforation after cscope  Hx cholecystectomy 2018  Hx LLE DVT    Plan:  In brief,  Jennifer [name]  is a 67 y.o. year old morbidly obese patient with comorbid conditions who has expressed an interest in robotic/laparoscopic SG-Sleeve. Patient was given a preoperative testing check list which includes dietary and psychiatric counseling for behavioral modification and medical/cardiac clearance (if indicated). Patient will be seen in the office on multiple occasions preoperatively.   In the best interest of this patients overall success, we would appreciate your assistance with changing medications to chewable, crushable and/or liquid form for postoperative period and necessary preoperative clearances.    An omentopexy may need to be performed to stabilize the stomach wall.  This can prevent gastric twist which is a functional cause of gastric stenosis.  The remnant stomach is fixed to the gastrosplenic and gastrocolic ligaments.  The procedure can also aid to decrease the rate of gastroesophageal reflux, postoperative food intolerance and gastric leaks. This procedure is intended to achieve better treatment outcomes in terms decreased risk of complications from the gastric sleeve.      History of Present Illness:  I had a pleasure of seeing Jennifer [name] in the office in consultation for possible bariatric surgery to resolve and treat morbid obesity and comorbid conditions. Patient has tried and failed multiple  previous attempts at conservative weight loss programs.     Bariatric comorbidities present:   Hypertriglyceridemia- crestor  Prediabetes- no medication  Perforation after colonoscopy 2007- laparoscopic transverse colon resection  Hx DVT after cholecystectomy- LLE. 2 months AC and now off. Has IVC filter from that time    We had a long discussion and thoroughly reviewed past medical and surgical history. she has attended an informational seminar/webinar and was given a booklet summarizing weight loss surgery options, risks and results. The surgical options discussed include robotic/laparoscopic Roux-en Y gastric bypass, laparoscopic adjustable gastric banding and robotic/laparoscopic sleeve gastrectomy. All questions and concerns were addressed in detail.     AOM: ozempic- didn't work. Sates makes more hungry      The following portions of the patient's history were reviewed and updated as appropriate: allergies, current medications, past family history, past medical history, past social history, past surgical history, and problem list.  CURRENT PROBLEM LIST: Problem List[1]  PAST MEDICAL HISTORY: Medical History[2]  PAST SURGICAL HISTORY:   Past Surgical History:   Procedure Laterality Date    ADENOIDECTOMY      APPENDECTOMY (OPEN)  2009    ARTHROPLASTY, KNEE, TOTAL Left 02/12/2020    Procedure: LEFT ARTHROPLASTY, KNEE, TOTAL;  Surgeon: Harvie Bridge, MD;  Location: [facility];  Service: Orthopedics;  Laterality: Left;    BACK SURGERY  1973    lumbar    CARDIAC ABLATION  2018    SVT    CHOLECYSTECTOMY  2018    COLON SURGERY  2008    resection, perforated colon  with colonoscopy    COLONOSCOPY, DIAGNOSTIC (SCREENING)      JOINT REPLACEMENT      bilat hips, L 2005, R 2010, right knee 2015    OTHER SURGICAL HISTORY  2018    IVC filter    SMALL INTESTINE SURGERY  2007    Colon resection post colonoscopy    SPINE SURGERY  11/26/1971    TONSILLECTOMY       FAMILY HISTORY:    Family History   Problem Relation Age of  Onset    Cancer Mother         lung    Arthritis Mother     Heart disease Mother         A fib/ chf    Stroke Mother     Heart attack Father 32    Cancer Son         Thyroid    Cancer Maternal Uncle      SOCIAL HISTORY:   Social History     Socioeconomic History    Marital status: Married     Spouse name: None    Number of children: 3    Years of education: None    Highest education level: None   Occupational History    Occupation: retired Charity fundraiser   Tobacco Use    Smoking status: Never     Passive exposure: Past    Smokeless tobacco: Never   Vaping Use    Vaping status: Never Used   Substance and Sexual Activity    Alcohol use: Yes     Comment: rarely- holidays    Drug use: Not Currently    Sexual activity: Yes     Partners: Male     Birth control/protection: Post-menopausal   Other Topics Concern    Dietary supplements / vitamins Not Asked    Anesthesia problems Not Asked    Blood thinners Not Asked    Pregnant Not Asked    Future Children Not Asked    Number of Pregnancies? Not Asked    Number of children Not Asked    Miscarriages / Abortions? Not Asked    Eats large amounts No    Excessive Sweets Yes    Skips meals Yes    Eats excessive starches Yes    Snacks or grazes Yes    Emotional eater Yes    Eats fried food Yes    Eats fast food Yes    Diet Center No    Doylene Bode No    [facility] Weight Loss No    Nutri-System Yes    Opti-Fast / Medi-Fast No    Overeaters Anonymous No    [facility] Yes    TOPS No    Weight Watchers Yes    Atkins No    Binging / Purging No    Calorie Counting No    Fasting No    High Protein No    Low Carb Yes    Low Fat Yes    [facility] Diet No    Slim Fast No    [place] No    Stationary cycle or treadmill No    Gym/fitness Classes No    Home exercise/video No    Swimming No    Weight training No    Walking or running No    Hospitalization No    Hypnosis No    Physical therapy No    Psychological therapy No    Residential program No  Acutrim No    [place] No    Contrave No     Dexatrim No    Diethylpropion No    Fastin No    Fen - Phen No    Ionamin / Adipex No    Phentermine No    Qsymia No    Prozac No    Saxenda No    Topamax No    Wellbutrin No    Xenical (Orlistat, Alli) No    Other Med Yes     Comment: oxempic    No impairment No    Walks with cane/crutch No    Requires a wheelchair No    Bedridden No    Are you currently being treated for depression? No    Do you snore? Yes    Are you receiving any medical or psychological services? No    Do you ever wake up at night gasping for breath? Yes    Do you have or have you been treated for an eating disorder? No    Anyone ever told you that you stop breathing while asleep? Yes    Do you exercise regularly? No    Have you or family member ever have trouble with anesthesia? No   Social History Narrative    None     Social Determinants of Health     Financial Resource Strain: Low Risk  (12/15/2022)    Overall Financial Resource Strain (CARDIA)     Difficulty of Paying Living Expenses: Not very hard   Food Insecurity: No Food Insecurity (12/15/2022)    Hunger Vital Sign     Worried About Running Out of Food in the Last Year: Never true     Ran Out of Food in the Last Year: Never true   Transportation Needs: No Transportation Needs (12/15/2022)    PRAPARE - Therapist, art (Medical): No     Lack of Transportation (Non-Medical): No   Physical Activity: Insufficiently Active (12/15/2022)    Exercise Vital Sign     Days of Exercise per Week: 1 day     Minutes of Exercise per Session: 60 min   Stress: No Stress Concern Present (12/15/2022)    [employer] of Occupational Health - Occupational Stress Questionnaire     Feeling of Stress : Not at all   Social Connections: Moderately Integrated (12/15/2022)    Social Connection and Isolation Panel [NHANES]     Frequency of Communication with Friends and Family: More than three times a week     Frequency of Social Gatherings with Friends and Family: More than three times a  week     Attends Religious Services: More than 4 times per year     Active Member of [employer] or Organizations: No     Attends Banker Meetings: Never     Marital Status: Married   Catering manager Violence: Unknown (12/15/2022)    Humiliation, Afraid, Rape, and Kick questionnaire     Fear of Current or Ex-Partner: No     Emotionally Abused: Patient declined     Physically Abused: Patient declined     Sexually Abused: No   Housing Stability: Low Risk  (12/15/2022)    Housing Stability Vital Sign     Unable to Pay for Housing in the Last Year: No     Number of Times Moved in the Last Year: 0     Homeless in the Last Year: No    (Does  not refresh)    TOBACCO HISTORY: Tobacco Use History[3]  Tobacco Dependence:  Tobacco Use History[4]  ALCOHOL HISTORY:   Social History     Substance and Sexual Activity   Alcohol Use Yes    Comment: rarely- holidays     DRUG HISTORY:   Social History     Substance and Sexual Activity   Drug Use Not Currently     CURRENT HOSPITAL MEDICATIONS: Current Medications[5]  CURRENT OUTPATIENT MEDICATIONS: Medications Taking[6]  ALLERGIES: Allergies[7]       Review of Systems  Constitutional: negative for fevers, night sweats  Respiratory: negative for SOB, cough  Cardiovascular: negative for chest pain, palpitations  Gastrointestinal: negative for abd pain  Genitourinary:negative for hematuria, dysuria  Musculoskeletal:negative for bone pain, myalgias and stiff joints  Neurological: negative for dizziness, gait problems, headaches and memory problems  Behavioral/Psych: negative for fatigue, loss of interest in favorite activities, separation anxiety and sleep disturbance  Endocrine: negative for temperature intolerance  Integumentary: No rashes, no skin infections    Objective:    BP 141/81   Pulse 92   Ht 5\' 2"    Wt 248 lb   BMI 45.36 kg/m   Body mass index is 45.36 kg/m.  Weight: 248 lb       General Appearance:    Alert, cooperative, no distress, obese   Head:    Normocephalic,  without obvious abnormality, atraumatic   Eyes:    No scleral icterus          Data Review:     Appointment on 10/19/2022   Component Date Value Ref Range Status    Hepatitis C, AB 10/19/2022 Non-Reactive  Non Reactive Final    Non-Reactive: Antibodies to HCV not detected.    Glucose 10/19/2022 116 (H)  70 - 100 mg/dL Final    Comment: ADA guidelines for diabetes mellitus:  Fasting:  Equal to or greater than 126 mg/dL  Random:   Equal to or greater than 200 mg/dL      BUN 16/02/9603 54.0  7.0 - 21.0 mg/dL Final    Creatinine 98/03/9146 0.8  0.4 - 1.0 mg/dL Final    Sodium 82/95/6213 142  135 - 145 mEq/L Final    Potassium 10/19/2022 4.2  3.5 - 5.3 mEq/L Final    Chloride 10/19/2022 105  99 - 111 mEq/L Final    CO2 10/19/2022 26  17 - 29 mEq/L Final    Calcium 10/19/2022 9.6  8.5 - 10.5 mg/dL Final    Protein, Total 10/19/2022 6.2  6.0 - 8.3 g/dL Final    Albumin 08/65/7846 3.5  3.5 - 5.0 g/dL Final    AST (SGOT) 96/29/5284 13  5 - 41 U/L Final    ALT 10/19/2022 16  0 - 55 U/L Final    Alkaline Phosphatase 10/19/2022 47  37 - 117 U/L Final    Bilirubin, Total 10/19/2022 0.3  0.2 - 1.2 mg/dL Final    Globulin 13/24/4010 2.7  2.0 - 3.6 g/dL Final    Albumin/Globulin Ratio 10/19/2022 1.3  0.9 - 2.2 Final    Anion Gap 10/19/2022 11.0  5.0 - 15.0 Final    Comment: Calculated AGAP = Na - (CL + CO2)  Interpret with caution; calculated AGAP may not  reflect patient's true clinical status.  This is a calculated value and platform-dependent.  A value >12.0 has been recommended for the management of  Hyperglycemic Crises: Diabetic Ketoacidosis and Hyperglycemic  Hyperosmolar State. Med Clin [place]. 2017;101(3):587-606.  doi:10.1016/j.mcna.2016.12.011      eGFR 10/19/2022 >60.0  >=60 mL/min/1.73 m2 Final    Comment: Reported eGFR is based on the CKD-EPI 2021 equation that  does not use a race coefficient. This equation is used for  all patients (both Black and non-Black), and old and new GFR  estimates may differ by more than  10%, particularly at higher  eGFRcr values and at younger ages. For eGFR of 45-59 mL/min/1.73 m2  with uACR <30 mg/g, please check NKF KDOQI and KDIGO guidelines:  https://www.kidney.org/professionals/kdoqi    GFR estimates are unreliable in patients with:  Rapidly changing kidney function or recent dialysis,  extreme age, body size or body composition(obesity,  severe malnutrition). Abnormal muscle mass (limb  amputation, muscle wasting). In these patients,  alternative determinations of GFR should be obtained.      Cholesterol 10/19/2022 173  0 - 199 mg/dL Final    Triglycerides 10/19/2022 219 (H)  34 - 149 mg/dL Final    HDL 25/36/6440 40  40 - 9,999 mg/dL Final    Comment: An HDL cholesterol <40 mg/dL is low and constitutes a  coronary heart disease risk factor, and HDL-C>59 mg/dL is  a negative risk factor for CHD.  Ref: American Heart Association; Circulation 2004      LDL Calculated 10/19/2022 89  0 - 99 mg/dL Final    VLDL Calculated 10/19/2022 44 (H)  10 - 40 mg/dL Final    Cholesterol / HDL Ratio 10/19/2022 4.3  See Below Index Final    Comment: Chol/HDL Ratio:  Classification                   Female     Female  Very Low (1/2 Average Risk)      <3.4     <3.3  Low Risk                         4.0      3.8  Average Risk                     5.0      4.5  Moderate Risk (2X Average risk)  9.5      7.0  High Risk (3X Average Risk)      >23.0    >11.0      Hemoglobin A1C 10/19/2022 5.9 (H)  4.6 - 5.6 % Final    Comment: Test performed using Abbott Alinity enzymatic method.  Per ADA guidelines Hemoglobin A1c values of 5.7-6.4%  indicate an increased risk for developing diabetes mellitus.  Hemoglobin A1c values greater than or equal to 6.5% are  diagnostic of diabetes mellitus. A Triglyceride result  greater than 3000 mg/dL can falsely decrease HKV4Q results.  Congenital hemoglobinopathy can falsely lower the Hemoglobin  A1c values - Boronate-affinity HbA1c is recommended.      Average Estimated Glucose 10/19/2022  122.6  mg/dL Final    Hemolysis Index 10/19/2022 0  0 - 24 Index Final    Comment: Hemolysis Index is an internal quality control value and not a diagnostic  indicator.       .  Radiology Results   XR HIP RIGHT 1 VW WITH PELVIS    Result Date: 12/07/2022  AP Pelvis, Frog Lateral, AP.     3 views of the patient's right hip were obtained in the office today.   Patient has an uncemented Corail stem in place.  Some heterotopic bone iliopsoas  tendon noted.  Implant is well-fixed.  Acetabulum shows no evidence of complication.  Minimal polyethylene creep is noted.  Lumbar degenerative disc disease noted incidentally at the lower levels.    Mammo Screening w/Tomo Bilateral    Result Date: 12/01/2022  HISTORY: Screening mammogram. COMPARISON: 07/18/2021. PARENCHYMAL PATTERN: There are scattered areas of fibroglandular density. TECHNIQUE: Bilateral digital screening mammogram with CAD and 3D tomosynthesis performed. FINDINGS: There are benign appearing calcifications in both breasts. No suspicious masses, suspicious calcifications or unexplained areas of architectural distortion.     No mammographic evidence of malignancy.   A LETTER WAS GENERATED TO THE PATIENT INDICATING THESE RESULTS.                                                             RECOMMENDATION:  SOCIETY OF BREAST IMAGING AND AMERICAN COLLEGE OF RADIOLOGY RECOMMENDATIONS FOR IMAGING SCREENING FOR BREAST CANCER  " Yearly screening mammogram from age 58* * for women at average risk of breast cancer. To see the full SBI/ACR recommendations go to http://www.mammographysaveslives.org/Facts/Guidelines BIRADS CATEGORY 02-BENIGN FINDING (2A) Lorelei Pont 12/01/2022 12:54 PM        Jennifer Silvius, MD  Bariatric Surgery  Cell [phone]  Office [phone]         [1]   Patient Active Problem List  Diagnosis    Primary osteoarthritis of left knee    Mass of right breast, unspecified quadrant    Ventricular tachyarrhythmia    Hypertriglyceridemia     Hypothyroidism, unspecified type    DDD (degenerative disc disease), lumbosacral    Prediabetes    Class 3 severe obesity with serious comorbidity and body mass index (BMI) of 40.0 to 44.9 in adult, unspecified obesity type    Seizure disorder    SVT (supraventricular tachycardia)    History of colon resection    History of cholecystectomy    History of maternal deep vein thrombosis (DVT)   [2]   Past Medical History:  Diagnosis Date    Abnormal vision     glasses    Arrhythmia 2018    SVT (had ablation)    Arthritis     Complication of anesthesia     urinary retention with every joint replacement, BP drop with spinal X1, another time spinal didn't work    Convulsions     epilepsy, last seizure 2010    Deep venous thrombosis of distal lower extremity 2018    after cholecystectomy; left leg    Encounter for blood transfusion 2018    Gastric ulceration 2018    GI bleed with requiring 4 units PRBCs per pt    Hyperlipidemia     Hypothyroidism     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    Epilepsy    Pre-diabetes    [3]   Social History  Tobacco Use   Smoking Status Never    Passive exposure: Past   Smokeless Tobacco Never   [4]   Social History  Tobacco Use   Smoking Status Never    Passive exposure: Past   Smokeless Tobacco Never   [5]   Current Outpatient Medications   Medication Sig Dispense Refill    aspirin 325 MG tablet Take 1 tablet (325 mg) by mouth daily      clonazePAM (KlonoPIN) 1 MG tablet Take 1 tablet (  1 mg) by mouth 3 (three) times daily as needed      Ergocalciferol (VITAMIN D2 PO) Take by mouth daily      gabapentin (NEURONTIN) 600 MG tablet TAKE ONE TABLET BY MOUTH TWO OR THREE TIMES DAILY      Keppra XR 750 MG extended release 24 hr tablet Take 2 tablets (1,500 mg) by mouth 2 (two) times daily      Lacosamide (Vimpat) 200 MG Tab Take by mouth 2 (two) times daily      levothyroxine (SYNTHROID) 100 MCG tablet TAKE ONE TABLET BY MOUTH WEEKLY ON SUNDAY 15 tablet 0    levothyroxine (SYNTHROID) 88 MCG tablet TAKE ONE  TABLET BY MOUTH EVERY DAY  EXCEPT SUNDAY 90 tablet 0    metoprolol succinate XL (TOPROL-XL) 25 MG 24 hr tablet Take 1 tablet (25 mg) by mouth daily 90 tablet 0    Omega-3 Fatty Acids (Fish Oil) 500 MG Cap Take 1 capsule (500 mg) by mouth      rosuvastatin (CRESTOR) 5 MG tablet Take 1 tablet (5 mg) by mouth every morning      triamterene-hydrochlorothiazide (DYAZIDE) 37.5-25 MG per capsule Take 1 capsule by mouth every morning 180 capsule 0    gabapentin (NEURONTIN) 300 MG capsule Take 1 capsule (300 mg) by mouth 2 (two) times daily as needed (pain) 60 capsule 3    levETIRAcetam (KEPPRA) 750 MG tablet Take 2 tablets (1,500 mg) by mouth 2 (two) times daily       No current facility-administered medications for this visit.   [6]   Outpatient Medications Marked as Taking for the 12/26/22 encounter (Office Visit) with Gaylene Brooks, MD   Medication Sig Dispense Refill    aspirin 325 MG tablet Take 1 tablet (325 mg) by mouth daily      clonazePAM (KlonoPIN) 1 MG tablet Take 1 tablet (1 mg) by mouth 3 (three) times daily as needed      Ergocalciferol (VITAMIN D2 PO) Take by mouth daily      gabapentin (NEURONTIN) 600 MG tablet TAKE ONE TABLET BY MOUTH TWO OR THREE TIMES DAILY      Keppra XR 750 MG extended release 24 hr tablet Take 2 tablets (1,500 mg) by mouth 2 (two) times daily      Lacosamide (Vimpat) 200 MG Tab Take by mouth 2 (two) times daily      levothyroxine (SYNTHROID) 100 MCG tablet TAKE ONE TABLET BY MOUTH WEEKLY ON SUNDAY 15 tablet 0    levothyroxine (SYNTHROID) 88 MCG tablet TAKE ONE TABLET BY MOUTH EVERY DAY  EXCEPT SUNDAY 90 tablet 0    metoprolol succinate XL (TOPROL-XL) 25 MG 24 hr tablet Take 1 tablet (25 mg) by mouth daily 90 tablet 0    Omega-3 Fatty Acids (Fish Oil) 500 MG Cap Take 1 capsule (500 mg) by mouth      rosuvastatin (CRESTOR) 5 MG tablet Take 1 tablet (5 mg) by mouth every morning      triamterene-hydrochlorothiazide (DYAZIDE) 37.5-25 MG per capsule Take 1 capsule by mouth every  morning 180 capsule 0   [7] No Known Allergies

## 2022-12-31 ENCOUNTER — Telehealth (HOSPITAL_BASED_OUTPATIENT_CLINIC_OR_DEPARTMENT_OTHER): Payer: Medicare Other | Admitting: Registered"

## 2022-12-31 ENCOUNTER — Encounter (INDEPENDENT_AMBULATORY_CARE_PROVIDER_SITE_OTHER): Payer: Self-pay | Admitting: Internal Medicine

## 2022-12-31 ENCOUNTER — Encounter (HOSPITAL_BASED_OUTPATIENT_CLINIC_OR_DEPARTMENT_OTHER): Payer: Self-pay | Admitting: Registered"

## 2022-12-31 ENCOUNTER — Encounter (INDEPENDENT_AMBULATORY_CARE_PROVIDER_SITE_OTHER): Payer: Self-pay

## 2022-12-31 DIAGNOSIS — Z713 Dietary counseling and surveillance: Secondary | ICD-10-CM

## 2022-12-31 DIAGNOSIS — Z719 Counseling, unspecified: Secondary | ICD-10-CM

## 2022-12-31 NOTE — Progress Notes (Signed)
Verbal consent has been obtained from the patient to conduct a telephone/video visit encounter to minimize exposure to COVID-19: YES    S:  Weight hx: Pt is a 67 y.o. female with morbid obesity presenting for initial nutrition evaluation for possible bariatric surgery. Pt interested in sleeve surgery.  Pt states desires weight loss surgery to help symptoms of co-morbid conditions and live a longer, more active lifestyle. Pt has tried the following diet and/or exercise programs:  weight watchers, optivia, atkins, nutrisystem, ozempic.  Pt reports losing the most weight using optivia.  Pt reports losing about 30 pounds.  Pt reports lowest weight maintained was 124 lbs.  Pt reports their highest weight recorded was: current weight.    Psychosocial History:  Pt states some emotional eating with stress or boredom.  Pt reports chips, pretzels as trigger foods.  Pt is able to identify the following emotions/situations that trigger them to eat:  stress.  Pt feels that they overeat/emotionally eat about 2-3 times weekly.    Allergies:  Allergies[1]  Physical Activity: Insufficiently Active (12/15/2022)    Exercise Vital Sign     Days of Exercise per Week: 1 day     Minutes of Exercise per Session: 60 min     Food Insecurity: No Food Insecurity (12/15/2022)    Hunger Vital Sign     Worried About Running Out of Food in the Last Year: Never true     Ran Out of Food in the Last Year: Never true         O:  Ht:    Ht Readings from Last 1 Encounters:   12/26/22 5\' 2"      Wt:  248 lbs  BMI:  There is no height or weight on file to calculate BMI.   IBW:  121 lbs Excess Wt:  130 lbs ABW:  135 lbs    Vitamin/Mineral Deficiency Hx:  Vitamin D  Pt is currently taking the following OTC supplements:  Vitamin D, fish oil.    Smoker:  none    Comorbidities:  Problem List[2]    Diet Overview:  Food Recall:     Breakfast:  eggs, bacon, pancakes OR waffles OR bagel     Lunch:  fast food OR fried chicken, mashed potatoes and mac and  cheese     Dinner:  baked chicken, carrots and salad     Snacks:  no sugar added canned fruit, candy, cookies, chips, pretzels     Beverages:  water, regular soda, unsweetened tea     Alcohol:  a few drinks a year    Nutrition Diagnosis:  Pt presents with obesity related to a history of excessive energy intake, limited physical activity, and food and nutrition related knowledge deficit as evidenced by report, recall, and BMI.      A:  Per report and diet recall, consumes a diet high in calorie.  Discussed necessary diet changes related to bariatric surgery.  Per pt insurance, pt is required to complete 3-6 months of pre-surgery weight loss classes and classes will help to strengthen knowledge deficit and post-operative weight loss management.  Pt comprehension level moderate and anticipated compliance moderate.  Pt receives my recommendation as a good candidate for bariatric surgery.  Would recommend this pt for WLS.    P:  We discussed nutritional expectations prior to bariatric surgery including insurance-required goals and reduced surgical risks associated with wt loss prior to surgery.  Pt was provided with an overview of pre-op and post-op dietary  stages as well as lifelong vitamin/mineral supplementation requirements.  We discussed the value of tracking foods and measuring portions to increase awareness and accountability.  We also discussed the importance of consistent intake, appropriate protein intake and long-term commitment to physical activity.  Initial goals:    1.  Pt to continue with current weight loss program as required by insurance company.    2.  Pt to review nutrition plan post - operatively and contact RD with questions.  Contact information provided.      Pt verbalized understanding and did not voice any additional questions.  Spent a total of 30 minutes educating pt in a individual one-on-one setting.  Plan reviewed with surgeon.         [1] No Known Allergies  [2]   Patient Active Problem  List  Diagnosis    Primary osteoarthritis of left knee    Mass of right breast, unspecified quadrant    Ventricular tachyarrhythmia    Hypertriglyceridemia    Hypothyroidism, unspecified type    DDD (degenerative disc disease), lumbosacral    Prediabetes    Class 3 severe obesity with serious comorbidity and body mass index (BMI) of 40.0 to 44.9 in adult, unspecified obesity type    Seizure disorder    SVT (supraventricular tachycardia)    History of colon resection    History of cholecystectomy    History of maternal deep vein thrombosis (DVT)

## 2023-01-01 ENCOUNTER — Other Ambulatory Visit (INDEPENDENT_AMBULATORY_CARE_PROVIDER_SITE_OTHER): Payer: Self-pay | Admitting: Family

## 2023-01-01 ENCOUNTER — Telehealth (INDEPENDENT_AMBULATORY_CARE_PROVIDER_SITE_OTHER): Payer: Self-pay

## 2023-01-01 ENCOUNTER — Encounter (INDEPENDENT_AMBULATORY_CARE_PROVIDER_SITE_OTHER): Payer: Self-pay

## 2023-01-01 DIAGNOSIS — Z01818 Encounter for other preprocedural examination: Secondary | ICD-10-CM

## 2023-01-01 NOTE — Telephone Encounter (Signed)
Pt is required to have an EKG prior to her endoscopy w/ Dr. Rosaland Lao on 02/06/23. Cardio is requiring an order. Can you order an EKG?

## 2023-01-02 ENCOUNTER — Encounter (HOSPITAL_BASED_OUTPATIENT_CLINIC_OR_DEPARTMENT_OTHER): Payer: Self-pay | Admitting: Licensed Clinical Social Worker

## 2023-01-02 ENCOUNTER — Ambulatory Visit (HOSPITAL_BASED_OUTPATIENT_CLINIC_OR_DEPARTMENT_OTHER): Payer: Medicare Other | Admitting: Licensed Clinical Social Worker

## 2023-01-02 VITALS — Wt 246.0 lb

## 2023-01-02 DIAGNOSIS — Z6841 Body Mass Index (BMI) 40.0 and over, adult: Secondary | ICD-10-CM

## 2023-01-02 DIAGNOSIS — Z01818 Encounter for other preprocedural examination: Secondary | ICD-10-CM

## 2023-01-02 DIAGNOSIS — F439 Reaction to severe stress, unspecified: Secondary | ICD-10-CM

## 2023-01-02 DIAGNOSIS — Z719 Counseling, unspecified: Secondary | ICD-10-CM

## 2023-01-02 NOTE — Progress Notes (Signed)
01/02/2023    [facility]  [address]  [place], [place] [address]  [phone] Phone  [phone] Fax     DOB: [birth year]    MRN#: [id number]  Name: Jennifer [name]    On 01/02/2023, I met in the clinic with Jennifer [name] for a psychological evaluation for clearance for bariatric surgery.  A semi-structured diagnostic interview with Jennifer [name] reviewed her background information including her weight/dietary history and current behavioral patterns of eating. Gilford Rile Brar's candidacy for bariatric surgery was assessed based on the criteria and guidelines suggested by American Society for Metabolic and Bariatric Surgery.    In our meeting, Jennifer [name]'s mental status presentations and her social/emotional functioning were assessed, as well as current and potential future life stressors. Surgery candidacy determination included a review of  Jennifer [name]'s understanding of bariatric surgery and the psychological and behavioral requisites that support a successful outcome.  The meeting with Jennifer [name] included an emphasis on her lifelong personal responsibilities to comply with health monitoring requirements, nutritional guidelines and a program of regular physical activity.  She was advised of areas of focus and potential challenges specific to her psychosocial history and eating behavioral profile.              Although an initial evaluation cannot comprehensively determine any patient's long term prognosis for bariatric success, Jennifer [name] did not present with any palpable psychological counter-indications for bariatric surgery and she is clinically cleared for surgery at this time.  For details of assessment, please see report below.    Jennifer [name] reported she is participating in [facility]'s pre-surgical nutritional program.  She reported examples of implementing behavioral change to support her goal of a healthier life style.  The ramifications of personal change from  both short term and long term perspectives were discussed.     Jennifer [name] has been advised of the immediate and long term psychological challenges of bariatric surgery in general as well as those specific to her profile. Jennifer [name] has been advised of cognitive and behavioral symptoms that could compromise an optimal outcome to her course of treatment and was given bariatric resources for future reference.     Yehuda Budd, PhD, LCSW  Behavioral Health Therapist IV  Integrated Behavioral Health  [facility]       Outpatient Services Bariatric Psychological Evaluation    01/02/2023    Start: 10:00 AM End:  10:57 AM    Interpreter present? no    Verbal Informed Consent:  During COVID-19 pandemic, requirement for written consent has been waived. Provider will review the following documents verbally with patient prior to starting this evaluation.     Telemedicine:   Verbal consent has been obtained from the patient to conduct a video visit.   Yes    Informed Consent for Behavioral Health Therapy:  Review of informed consent for behavioral health therapy was completed verbally:   Yes    Reason for appointment: Jennifer [name] is a 67 y.o. Caucasian married female presenting for a psychological evaluation for a clinical recommendation for bariatric related surgery.    Behavioral Observations:     Jennifer [name] was on time for the visit.  Patient presented as alert, oriented in all spheres and engaged in the encounter.  Patient presents as psychologically stable and cognitively able to describe, understand and consent to bariatric surgery.     Assessments were done to review patient's surgical readiness regarding patient's mental status and  levels of functioning, psychiatric history, psychosocial components of eating behavior, past and present physical activity status, family of origin dynamics, body image, trauma history, weight management history and life style adjustment plans.     Purpose of  Assessment:  To determine within the limits of psychological certainly:  Whether the patient is prepared for the changes in diet and lifestyle which bariatric surgery will impose; whether the patient has a reliable support system and access to supportive resources;  Whether patient is committed to post operative compliance following bariatric surgery; and whether the patient has sufficient information on which to base informed consent for bariatric surgery.     Psychosocial History:     Patient is originally from:  [place]  Children: 3. 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 Private Vehicle - Patient to drive self.    Financial resource strain: none  Food insecurity: none    H/O Psychiatric Symptoms:  Anxiety:  no anxiety symptoms  Eating Disorders: Denies associated symptoms.  History of disordered eating requiring treatment: none  Concerns about disordered eating behavior now: none  Depression: no depression symptoms  Mania: Denies associated symptoms.  OCD: NA  Psychosis: NA  Other: no SU reported    Family History   Problem Relation Age of Onset    Cancer Mother         lung    Arthritis Mother     Heart disease Mother         A fib/ chf    Stroke Mother     Heart attack Father 67    Cancer Son         Thyroid    Cancer Maternal Uncle      Family  History of Death by Suicide/ Homicide: none  Family History of Mental Illness: Patient denied   Family History of Substance Use: Patient denied   Family History of Obesity/Overweight/Weight Management Issues: Yes- mother, was able to lose the weight    She denies a history of  trauma or abuse  experienced.       Mental Health Treatment History and Outcomes:  Previous Treatment/diagnosis: no None   Current/Previous Psychiatrist: none  Current/Previous Therapist: none  Psychotropic medications: none  Previous hospitalizations: none    Use of caffeine: tea 1 cup /day  Tobacco use: no  Substance Use: Alcohol  uses currently and has used in the past- special occasions at holiday times    C.A.G.E  Patient feels she ought to cut down on drinking and/or drug use: no  Patient has been annoyed by others criticizing drinking or drug use: no  Patient has felt bad or guilty about her drinking or drug use:no  Patient has had a drink or used drugs as an eye opener first thing in the morning to steady nerves, get rid of a hangover or get the day started: no    Patient acknowledges  information about potential transfer of addictive behavior patterns following weight loss surgery.     Legal History: (past or pending charges, convictions, probation or parole status)    Legal consequences of chemical use: no   Legal status: The patient has no significant history of legal issues.      Weight Hx:   Patient is preparing for the following bariatric surgery: the sleeve  Patient accurately described the procedure and notes no current or past abnormal anxiety, fear, phobias in regards to surgical and medical procedures.      Obesity Related Psychosocial distress:  ~has other health comorbid conditions  ~has tried other weight loss methods with no sustained loss  ~wants to be healthier  ~has been struggling with weight for a while after pregnancies  ~wants to have better self image and feel better about herself  ~wants to be able to travel more  comfortably  ~needs to have ankle surgery and wants to be at a better weight for that    Obesity Narrative:  Period of greatest weight gain: 50+ lb during early adult years  Lowest adult weight: 124 lb  Highest adult weight: 248 lb  Present Weight: 246 lb  Goal Weight:  170-180 lb  Patient has made the following behavioral changes to improve readiness for surgery:  ~Water intake daily: 2 bottles (33 oz each)  ~Exercise: walking as needed but not really doing anything  ~making healthy food choices in meals mostly  Percentage of time maintaining changes: since starting the program    Who shops for food? patient and husband  Who prepares food? patient and husband  Who eats with patient? patient and husband    Diet Recall:              Breakfast:  eggs, bacon, pancakes OR waffles OR bagel                 Lunch:  fast food OR fried chicken, mashed potatoes and mac and cheese                 Dinner:  baked chicken, carrots and salad                 Snacks:  no sugar added canned fruit, candy, cookies, chips, pretzels                 Beverages:  water, regular soda, unsweetened tea                 Alcohol:  a few drinks a year    Patient has tried multiple diet and exercise programs without achieving long term success. She reports feeling that she has exhausted her options for independent weight loss and needs the assistance of surgery to improve her health, quality and quantity of life.   Per patient's insurance, patient is required to complete pre-surgery education classes to help to strengthen knowledge deficit and support behaviors of post-operative weight loss management.        Current Exercise Habits walking    Bariatric "Bucket list:"  ~wants to feel better about herself    Assessments and inventories:     PHQ-9      Last Filed Value    Over the last 2 weeks, how often have you been bothered by any of the following problems?      Little interest or pleasure in doing things Not at allLittle interest or pleasure in  doing things. Not at all. Data is from  another encounter. Last Filed Value   Feeling down, depressed, or hopeless Not at allFeeling down, depressed, or hopeless. Not at all. Data is from another encounter. Last Filed Value   PHQ2 Score --   Trouble falling or staying asleep, or sleeping too much --   Feeling tired or having little energy --   Poor appetite or overeating --   Feeling bad about yourself - or that you are a failure or have let yourself or your family down --   Trouble concentrating on things, such as reading the newspaper or watching television --   Moving or speaking so slowly that other people could have noticed. Or the opposite - being so fidgety or restless that you have been moving around a lot more than usual --   Thoughts that you would be better off dead, or of hurting yourself in some way --   PHQ Total Score --   If you checked off any problems, how difficult have these problems made it for you to do your work, take care of things at home, or get along with other people?        Mental Status Exam:   General Appearance: neatly groomed, adequately nourished, and casually dressed  Behavior/Psychomotor: normal  and good eye contact  Mood: euthymic  Affect: congruent with mood  Speech: normal pitch and normal volume  Language: verbal expression is clear , has comprehensible ideas through verbal articulation, and ideas are conveyed and properly produced  Thought Form/Process: well organized, associations intact, and goal directed  Thought Content: absent of psychotic symptoms and absent of suicidal or homicidal ideation  Perception /Sensorium: clear, intact and makes sense of stimuli received  Judgment: good  Insight: good  Cognitive Functioning: cognition grossly intact, alert, oriented to person, oriented to place, oriented to time, oriented to situation, memory intact, and attentive     Risk Assessment:   Risk factors:Other: other health comorbid conditions and stressors at home  Protective  Factors: Identifies reasons for living Responsibility to family or others; living with family, Supportive social network of family or friends, and Engaged in work or school  Suicide Attempts/Gestures/Thoughts/Plan:-No suicidal thoughts, no intent, no plan  Homicidal Thoughts/Plan/Gestures:-No homicidal thoughts, no intent, no plan.  Recent Physical Aggression/Violence/Anger:Denies associated symptoms.  Access to Weapons: none    Diagnosis & Problem:      No diagnosis found.  Patient Active Problem List    Diagnosis Date Noted    DDD (degenerative disc disease), lumbosacral 12/26/2022    Prediabetes 12/26/2022    Class 3 severe obesity with serious comorbidity and body mass index (BMI) of 40.0 to 44.9 in adult, unspecified obesity type 12/26/2022    Seizure disorder 12/26/2022    SVT (supraventricular tachycardia) 12/26/2022    History of colon resection 12/26/2022    History of cholecystectomy 12/26/2022    History of maternal deep vein thrombosis (DVT) 12/26/2022    Ventricular tachyarrhythmia 04/06/2022    Hypertriglyceridemia 04/06/2022    Hypothyroidism, unspecified type 04/06/2022    Mass of right breast, unspecified quadrant 09/25/2021    Primary osteoarthritis of left knee 02/12/2020       Clinical Summary:     Jennifer [name] presents with obesity related psychosocial distress and weight related impairments in activities of daily living.    Patient's pre-operative challenges to focus on are in the following areas:  ~cut out the soda  ~cut out sugary items and have a protein snack when craving them  ~work on getting  in the habit of doing exercise, start with 1-2 times per week for 5-10 minutes per day to start    Jennifer [name] presents with strengths and positive supports in the following areas:  ~she has a very supportive family  ~she is very self-aware     Clinical assessment data suggests that the patient is a suitable bariatric surgical candidate at this time.    The patient indicates that she has made an  informed decision to have bariatric surgery and that she is knowledgeable of the risks, benefits, alternatives and the potential complications of the procedure.    The patient states reasonable post-procedural expectations. Patient acknowledges information about potential transfer of addictive behavior patterns following weight loss surgery. She asserts that she has reliable resources that will support her in her weight loss journey. She reports that she has told her husband and her children and son-in-law of her decision to have WLS and all are supportive of her choice. Arrangements for post-operative care and assistance have been made. Her husband will transport her to and from the surgery and support with the family in recovery.     Ms. Primeaux is psychologically cleared for surgery.    Referrals provided: Yes: the bariatric support resources

## 2023-01-03 ENCOUNTER — Encounter (INDEPENDENT_AMBULATORY_CARE_PROVIDER_SITE_OTHER): Payer: Self-pay

## 2023-01-05 NOTE — Patient Instructions (Addendum)
Seek immediate emergency medical attention if you experience severe or worsening abdominal pain, difficulty swallowing, stiff neck, shortness of breath, coughing or vomiting up blood, chest pain, increased fever, unexplained weight loss, or blood in stool.    If you are taking over-the-counter medication(s) follow the dosing instructions included in the packaging, unless otherwise instructed by your provider.  It is important to notify your primary care provider of all medications you are taking, including over-the-counter medications.  If your symptoms do not improve within 72 hours or if increasing frequency or severity of symptoms occurs, follow up with your primary care provider.  /[email or web address]  Bronchitis    Instructions    If your symptoms do not improve within 72 hours, follow up with your primary care provider.      Bronchitis Facts    What is bronchitis?  Bronchitis is inflammation in the tubes (bronchial tubes) that carry air to your lungs. When you get bronchitis, the tubes become irritated and mucus (a thick fluid) is made. Bronchitis usually lasts 10 days to 2 weeks, although a slight cough can last longer because of the irritation of the breathing tubes.    What causes bronchitis?  Most bronchitis (90%) is caused by viral infections that go away on their own. Only 10% is caused by bacteria (antibiotics only work against bacterial infections, not viral ones). Bronchitis may occur just when you think you are getting over a cold, since the same viruses that cause colds can also cause bronchitis.    How is bronchitis spread?  Bronchitis is spread when the germs are sprayed into the air or onto people's hands when they cough or sneeze. You can get bronchitis by breathing in the germs or by touching an object or person that is coated with the germs and then touching your nose or mouth. If you smoke or are exposed to smoke or toxic fumes, you are more likely to get bronchitis and have it longer.    What are  the signs and symptoms of bronchitis?  Coughing with or without phlegm     Pain with breathing  Wheezing (whistling when you breathe)    Fever <100.55F (usually lasts 48 hrs)  Cold-like symptoms: stuffy or runny nose, sore or scratchy throat, or muscle aches    How is bronchitis treated?  Antibiotics are not needed for most people with bronchitis. Treating the symptoms is the best way to feel better. Some self-care remedies include:  Try over-the-counter pain and fever relievers. Acetaminophen (Tylenol) or ibuprofen (Motrin or Advil) may help reduce pain and low-grade fevers. Follow the directions and precautions printed on the box. Do not take acetaminophen if you also take any other medicine that contains acetaminophen. Never give children aspirin because of its link to Reye's syndrome.  Use a cool mist humidifier. This helps to keep the air moist. Doing so helps thin out your mucus, making it easier to cough it up. Clean the humidifier daily to prevent bacteria and/or fungus from growing.  Get extra rest. Sleep helps your body fight the infection.  Unless your doctor told you to limit fluid intake, try to drink 8-10 (soda-can sized) glasses of liquids each day while awake. This helps thin the mucus so it can be coughed up more easily. Avoid caffeinated liquids.  Wash your hands frequently and cover your mouth while coughing or sneezing. This will help prevent the spread of infection to other people.  Avoid exposure to irritants, such as tobacco smoke. Wear a mask  when the air is polluted, or if you're exposed to irritants at work.            How can I prevent bronchitis?  Avoid smoking and exposure to secondhand smoke. Smoke prevents your lungs from fighting infection, it raises your risk of getting lung disease such as emphysema and harms the heart and lungs. If you are having trouble quitting, ask your primary care provider or [facility] practitioner for help.  Get a flu shot every year. Many cases of  bronchitis result from influenza viruses. Getting a flu shot every year can help protect you from getting the flu, and in turn, may reduce your risk of bronchitis.  Consider getting a pneumonia shot. If you are 47 or older, smoke, or have risk factors such as heart disease, diabetes, asthma or emphysema, the pneumonia shot may help prevent complications from bronchitis.    What if I don't feel better?  Bronchitis can turn into a more serious condition such as pneumonia. Follow up with your primary care provider within 72 hours if symptoms do not improve or sooner if any new symptoms develop. Seek immediate medical care if the following symptoms develop:  Worsening cough       Shortness of breath  Difficulty breathing       Chest pain  Temperature is greater than 100.23F    Breathing fast (24 breaths/min or faster)  Heart rate is greater than 100  Seek immediate emergency medical attention: Seek immediate emergency medical attention for severe or worsening abdominal pain, difficulty swallowing, stiff neck, shortness of breath, coughing or vomiting up blood, chest pain, increased fever, unexplained weight loss, or blood in stool.    If you are taking over-the counter medication(s): If you are taking over-the counter medication(s) follow the dosing instructions included in the packaging, unless otherwise instructed by your provider  It is important to notify your primary care provider of all medications you are taking, including over-the-counter medications.  /[email or web address]  Otitis Media       Otitis Media   What is Otitis Media (OM)?   Otitis media is a viral or bacterial inflammation of the middle ear located behind your ear drum. It is common in children between the ages of 6 months and 3 years, but can occur at any age. It is most often seen during the winter months.   What causes OM?   Otitis media is often diagnosed after you or your child has had a cold or allergies. Other risk factors include:   ? Exposure to  cigarette smoke ? Daycare attendance   ? Recent antibiotic use ? Facial abnormalities, such as a cleft palate   ? Problems with the Eustachian tube (a small passageway that connects the middle ear to the top of the throat)   What are the signs/symptoms of otitis media?   ? Rapid onset of ear pain ? pulling on ears for younger children   ? Fever ? Nose congestion   ? Headache ? Lethargy (listlessness)   ? Irritability (increased fussiness) ? Difficulty sleeping   ? Cough ? Decreased appetite (poor feeding)   ? Vomiting and/or diarrhea ? Full sensation in the ear or ear "popping"   ? Dizziness ? Hearing loss   How is otitis media treated?   Treatment depends on several factors - age, symptoms, and physical exam. If an antibiotic is prescribed, make sure to take all the pills even if feeling better. In some cases, you may be given  a prescription to fill at a later date if symptoms do not improve. This is called a Wait and See prescription. Fill the prescription if your symptoms don't improve in a day or two.   What are the warning signs I should watch for?   If any of the following symptoms develop, seek immediate medical attention at an emergency room:   ? Temperature over 103.44F ? Severe ear pain without relief   ? Inability to fully open the jaw ? Stiff neck   ? Unable to drink or hold fluids down   What if I don't feel better?   If you have filled your prescription, and you or your child still does not feel better, you should follow-up with your primary care provider in 48-72 hours. If you were given a Wait and See Prescription, fill the prescription and take as indicated.   Other remedies that may help reduce the symptoms include:   1. Apply warm compresses to your ear. Heat will help decrease the pain.   2. Elevate the head of the bed. This helps reduce ear pressure from building up. Raise the head of the crib for infants or use pillows for older children and adults.   3. Consider using nasal saline spray. For  both adults and children, squirt two to four sprays into each nostril 3-4 times a day. This helps thin nasal secretions and reduces ear pressure.   4. Distraction often provides relief. Try playing a game or watching a move to redirect your attention.   5. Hold or rock children that are having ear pain. This helps to soothe and comfort the child.   6. Try over-the-counter pain relievers. Acetaminophen (Tylenol) or ibuprofen (Motrin or Advil) are safe medicines to use. Follow the directions printed on the box.   How can I decrease the risk of developing recurrent otitis media?   There are several ways to diminish the risk factors for otitis media including the following:   ? Feed a child upright if bottle fed ? Avoid pacifier use after 16 months of age   ? Limit exposure to large groups of children when possible ? Practice careful hand washing technique   ? Avoid exposure to cigarette smoke. If you smoke, quit.   ? Keep immunizations up-to-date, including influenza (Flu) and pneumococcal (Prevnar/Pneumovax)   Sources: [employer], [email or web address], [employer], [employer]   http://www.young.net/ 316-522-0680.ASAP (2727)   Rev. 4/  Seek immediate emergency medical attention if you experience severe or worsening abdominal pain, difficulty swallowing, stiff neck, shortness of breath, coughing or vomiting up blood, chest pain, increased fever, unexplained weight loss, or blood in stool.    If your symptoms do not improve within 72 hours, follow up with [facility] or your primary care provider.  If your symptoms are not resolved within 2 weeks, follow up with your primary care provider.    If you are taking over-the-counter medication(s), follow the dosing instructions included in the packaging, unless otherwise instructed by your provider.  It is important to notify your primary care provider of all medications you are taking, including over-the-counter  medications.  /[email or web address]  Sinusitis    What is a Sinusitis?    Sinusitis is redness, soreness and inflammation (or swelling) of the sinuses. The sinuses are air pockets within the bones of your face (above and below your eyes and in the middle of your forehead).  Mucus normally drains out of the sinus space and air circulates through  by way of your nose. The inflammation with sinusitis traps the air and mucus allowing bacterial and other germs to grow and possibly cause an infection.     Causes of sinusitis    Most sinus infections are caused by viruses (colds, upper respiratory infections) or allergies. Less common are infections caused by bacteria.     Symptoms of sinusitis     The symptoms of sinusitis (facial pain or pressure, headache, cough, feeling tired, etc) are similar if your condition is caused by a viral or bacterial infection.    Treatment of sinusitis    Most cases of sinusitis are related to a viral infection and resolve on their own within 10 days. Viral infections do not require antibiotic treatment however over the counter medications may be recommended to help relieve symptoms such as acetaminophen or ibuprofen for pain, saline sprays to moisten and clean the sinuses and/or decongestants. Take these medicines only as directed by your health care provider.    REMEMBER:  Do not give aspirin to children because of the association with Reye's syndrome and decongestants such as pseudoephedrine (i.e. Sudafed) should not be taken if you have high blood pressure, heart disease, or a history of stroke.      Sinusitis caused by a bacterial infection is treated with an antibiotic. These are medicines that will help kill the bacteria causing the infection.     Home Care Recommendations:    Rest and drink plenty of water/fluids (unless your doctor has told you otherwise)  Use a humidifier or run a hot shower to create steam 3-4 times a day for  approximately 10 minutes at a time (This helps lessen  congestion)   Use salt water sprays (saline sprays) as directed  Apply a warm, moist washcloth to your face 3-4 times a day  If you were prescribed an antibiotic finish it all even if you start to feel better  Avoid tobacco smoke and other environmental irritants    Seek Medical Care Immediately from your primary care doctor, urgent care center or emergency room, if:    You have increasing pain or severe headaches  You have nausea, vomiting, or drowsiness  You have swelling around your face  You have vision problems  You have a stiff neck  You have trouble breathing  You develop a fever of 101.2 or higher.       Follow up with your primary care provider if your symptoms get worse or do not improve within 3 to 5 days.  Seek immediate emergency medical attention if you experience sudden vision loss or eye pain, severe or worsening abdominal pain, difficulty swallowing, stiff neck, shortness of breath, coughing or vomiting up blood, chest pain, increased fever, unexplained weight loss, or blood in stool.  Report to the nearest emergency department for any of the following: Fever >99.87F, flank pain, nausea/vomiting, or abdominal pain. If symptoms do not improve within 2-3 days of treatment or do not fully resolve return to [facility] or primary care provider for re-evaluation.  It is important to promptly share test results with your healthcare provider.  [facility] is committed to supporting you with your chronic healthcare needs.  It is important that you develop a relationship with a primary care provider who can manage your preventative and chronic care needs. [facility] cannot perform your annual physical or order your preventative screening tests, all of which are an important part of your overall healthcare.  If you do not have a primary care provider,  [facility] can help you by giving you a list of primary care providers in your area that are accepting new patients.  If you have health insurance, your  health insurance company can also assist you with finding a primary care provider.            Cholesterol and Your Health       Seek Immediate Medical Attention If You Have:   Sudden numbness or weakness of the face, arm or leg, especially on one side of the body.  Sudden confusion, trouble speaking or understanding.  Sudden trouble seeing or blurred vision in one or both eyes.  Sudden trouble walking, dizziness, loss of balance or coordination.  Pressure, tightness, pain, or a squeezing or aching sensation in your chest or arms that may spread to your neck, jaw or back.                        What is Cholesterol?  Cholesterol is a waxy, fat like substance needed by your body in small amounts. Your body makes all the cholesterol it needs.  You also get cholesterol from some types of foods. High cholesterol does not cause symptoms but is a risk factor for heart disease.     What do the Cholesterol Test Results Mean?   Total cholesterol is how much cholesterol is in your blood.  LDL is the type that can build up in your blood vessels. You want LDL to be low.  HDL carries your LDL away to the liver; which is why some say this is the 'good type'  Triglycerides are the most common type of fat in the body. It is needed by the body for energy but excess levels can lead to heart disease, liver disease and pancreatitis. You want this number to be low.     What are the Complications of High Cholesterol?   Deposits of cholesterol (plaque) may build up on blood vessel walls. The buildup makes the arteries narrower and stiffer and increases your risk for heart attack and stroke.      Prevention and Treatment:  Often cholesterol levels can be improved and controlled with lifestyle changes and when necessary, medications.  Adopting heart healthy habits can help:     Get down to and maintain a healthy weight  Eat a heart healthy diet full of vegetables, fruits and whole grains that are high in fiber  Limit your intake of oils,  animal products and other sources of high fat.   Limit your intake of refined carbohydrates as they can increase your triglyceride levels  Limit your alcohol consumption as that can increase your triglyceride levels   Be Physically Active every day.   Find heart healthy ways to reduce stress and increase relaxation   Take your medications as prescribed, even when you're feeling well  If you use any tobacco products- stop    06/2015          [email or web address] (2727)                                               [facility] is committed to supporting you with your chronic healthcare needs.  It is important that you develop a relationship with a primary care provider who can manage your preventative and chronic care needs. [facility] cannot perform your annual physical  or order your preventative screening tests, all of which are an important part of your overall healthcare.  If you do not have a primary care provider, [facility] can help you by giving you a list of primary care providers in your area that are accepting new patients.  If you have health insurance, your health insurance company can also assist you with finding a primary care provider.       Seek immediate emergency medical attention if you experience severe or worsening abdominal pain, difficulty swallowing, stiff neck, shortness of breath, coughing or vomiting blood, chest pain, increased fever, unexplained weight loss, or blood in stool.    It is important to promptly share test results with your PCP.  It is important to notify your primary care provider of all medications you are taking, including over-the-counter medications.    Maintain a healthy lifestyle and reduce your risk through lifestyle changes such as smoking cessation, achieving and/or maintaining a healthy weight, participating in regular physical activity (aim for 30-60 minutes on most days of the week) and avoiding a diet high in saturated and trans fats.     If  you are taking over-the-counter medication(s), follow the dosing instructions included in the packaging, unless otherwise instructed by your provider.                                                                                                                                                                                           Hypothyroidism               What is hypothyroidism? There is a gland in your neck called the thyroid gland. It makes thyroid hormone. This hormone controls how the body uses energy. When the gland doesn't make enough of the hormone, it is called hypothyroidism.   What are the symptoms of hypothyroidism? Some people with hypothyroidism have no symptoms. But most people feel tired. That can make the condition hard to diagnose, because a lot of conditions can make you tired.   Other symptoms associated with hypothyroidism include:  Lack of energy  Getting cold easily  Dry skin   Coarse or thin hair  Getting constipated (having too few bowel movements)  If it is not treated, hypothyroidism can also weaken and slow your heart. This can make you feel out of breath or tired when you exercise and it could cause swelling (fluid buildup) in your ankles. Untreated hypothyroidism can also increase your blood pressure and raise your cholesterol - both of which increase your risk for heart trouble.  Treatment and Home Care:     Take medicines only as instructed by  your health care provider. It may take several weeks before your symptoms go away.   If you start taking any new medicines, tell your health care provider.  Keep all follow-up visits with your healthcare provider. This is important. As your condition improves, your medication may need to be changed. You will also need to have blood tests regularly so that your health care provider can watch your condition.  Contact your healthcare provider if your symptoms do not get better with treatment (it may take several weeks) or if you are taking  medication and you:   Sweat excessively  Have tremors  Feel anxious  Lose weight rapidly  Cannot tolerate heat  Have emotional swings  Have diarrhea  You feel weak    06/2015          [email or web address] (2727)         Page 1 of 1  Seek immediate emergency medical attention: Seek immediate emergency medical attention for severe or worsening abdominal pain, difficulty swallowing, stiff neck, shortness of breath, coughing or vomiting up blood, chest pain, increased fever, unexplained weight loss, or blood in stool.    If you are taking over-the counter medication(s): If you are taking over-the counter medication(s) follow the dosing instructions included in the packaging, unless otherwise instructed by your provider  It is important to notify your primary care provider of all medications you are taking, including over-the-counter medications.  /[email or web address]  Otitis Media       Otitis Media   What is Otitis Media (OM)?   Otitis media is a viral or bacterial inflammation of the middle ear located behind your ear drum. It is common in children between the ages of 6 months and 3 years, but can occur at any age. It is most often seen during the winter months.   What causes OM?   Otitis media is often diagnosed after you or your child has had a cold or allergies. Other risk factors include:   ? Exposure to cigarette smoke ? Daycare attendance   ? Recent antibiotic use ? Facial abnormalities, such as a cleft palate   ? Problems with the Eustachian tube (a small passageway that connects the middle ear to the top of the throat)   What are the signs/symptoms of otitis media?   ? Rapid onset of ear pain ? pulling on ears for younger children   ? Fever ? Nose congestion   ? Headache ? Lethargy (listlessness)   ? Irritability (increased fussiness) ? Difficulty sleeping   ? Cough ? Decreased appetite (poor feeding)   ? Vomiting and/or diarrhea ? Full sensation in the ear or ear "popping"   ? Dizziness ? Hearing  loss   How is otitis media treated?   Treatment depends on several factors - age, symptoms, and physical exam. If an antibiotic is prescribed, make sure to take all the pills even if feeling better. In some cases, you may be given a prescription to fill at a later date if symptoms do not improve. This is called a Wait and See prescription. Fill the prescription if your symptoms don't improve in a day or two.   What are the warning signs I should watch for?   If any of the following symptoms develop, seek immediate medical attention at an emergency room:   ? Temperature over 103.43F ? Severe ear pain without relief   ? Inability to fully open the jaw ? Stiff neck   ? Unable to drink  or hold fluids down   What if I don't feel better?   If you have filled your prescription, and you or your child still does not feel better, you should follow-up with your primary care provider in 48-72 hours. If you were given a Wait and See Prescription, fill the prescription and take as indicated.   Other remedies that may help reduce the symptoms include:   1. Apply warm compresses to your ear. Heat will help decrease the pain.   2. Elevate the head of the bed. This helps reduce ear pressure from building up. Raise the head of the crib for infants or use pillows for older children and adults.   3. Consider using nasal saline spray. For both adults and children, squirt two to four sprays into each nostril 3-4 times a day. This helps thin nasal secretions and reduces ear pressure.   4. Distraction often provides relief. Try playing a game or watching a move to redirect your attention.   5. Hold or rock children that are having ear pain. This helps to soothe and comfort the child.   6. Try over-the-counter pain relievers. Acetaminophen (Tylenol) or ibuprofen (Motrin or Advil) are safe medicines to use. Follow the directions printed on the box.   How can I decrease the risk of developing recurrent otitis media?   There are several ways  to diminish the risk factors for otitis media including the following:   ? Feed a child upright if bottle fed ? Avoid pacifier use after 21 months of age   ? Limit exposure to large groups of children when possible ? Practice careful hand washing technique   ? Avoid exposure to cigarette smoke. If you smoke, quit.   ? Keep immunizations up-to-date, including influenza (Flu) and pneumococcal (Prevnar/Pneumovax)   Sources: [employer], [employer], [employer], [employer]   http://www.young.net/ 985 805 5434.ASAP (2727)   Rev. 4/

## 2023-01-05 NOTE — Progress Notes (Addendum)
Subjective  Patient ID: Jennifer [name] is a 67 y.o. female c/o cough, chills, wheezing, fatigue, sore throat, sinus pressure, b/l ear pressure, and congestion that started 4 weeks ago.     Chief Complaint   Patient presents with   . Respiratory   . Nose Complaint   . Ear Complaint         HPI   Respiratory  Primary Symptom: Cough/Respiratory Complaint    Duration of current symptoms:  4 weeks  Onset quality:   slowly over time    Associated symptoms: myalgias, chills, nasal congestion, cough, fatigue, headaches, rhinorrhea, postnasal drip, sinus pain, sore throat and nasal pain    Associated symptoms: no abdominal pain, no appetite change, no drooling, no ear pain, no facial swelling, no fever, no mouth sores, no neck stiffness, no rash, no diaphoresis, no swollen glands, no chest pain, no chest tightness, no nocturnal dyspnea, no shortness of breath, no sneezing, no syncope, no unexpected weight change, no wheezing, no decreased sense of smell/taste, no foreign body, no nosebleeds, no other, no nausea, no diarrhea, no ear redness, no ear edema, no hearing loss, no tinnitus and no vomiting    Treatments tried: over-the-counter medication, rest and inhaler    Response to Treatment: No change    Special considerations: none apply    Cough Characteristics: nonproductive    Nonproductive Cough: dry    Nose Complaint  Primary Symptom: Nasal/Sinus Complaint    Duration of current symptoms:  4 weeks  Onset quality:   slowly over time    Associated symptoms: myalgias, chills, nasal congestion, cough, fatigue, headaches, rhinorrhea, postnasal drip, sinus pain, sore throat and nasal pain    Associated symptoms: no abdominal pain, no appetite change, no drooling, no ear pain, no facial swelling, no fever, no mouth sores, no neck stiffness, no rash, no diaphoresis, no swollen glands, no chest pain, no chest tightness, no nocturnal dyspnea, no shortness of breath, no sneezing, no syncope, no unexpected weight change, no  wheezing, no decreased sense of smell/taste, no foreign body, no nosebleeds, no other, no nausea, no diarrhea, no ear redness, no ear edema, no hearing loss, no tinnitus and no vomiting    Treatments tried: over-the-counter medication    Response to Treatment: No change    Special considerations: none apply    Nasal Discharge: purulent    Ear Complaint  Primary Symptom: Ear complaint    Laterality:  Bilateral  Quality: pain    Pain type: aching    Duration of current symptoms:  4 weeks  Onset quality:   slowly over time    Associated symptoms: myalgias, chills, nasal congestion, cough, fatigue, headaches, rhinorrhea, postnasal drip, sinus pain, sore throat and nasal pain    Associated symptoms: no abdominal pain, no appetite change, no drooling, no ear pain, no facial swelling, no fever, no mouth sores, no neck stiffness, no rash, no diaphoresis, no swollen glands, no chest pain, no chest tightness, no nocturnal dyspnea, no shortness of breath, no sneezing, no syncope, no unexpected weight change, no wheezing, no decreased sense of smell/taste, no foreign body, no nosebleeds, no other, no nausea, no diarrhea, no ear redness, no ear edema, no hearing loss, no tinnitus and no vomiting    Treatments tried: over-the-counter medication and rest    Response to Treatment: No change    Special considerations: none apply    Discharge from ear: none    Mouth or throat complaint  Primary Symptom: Sore Throat/Mouth Problem  Duration of current symptoms:  14 days  Onset quality:   slowly over time    Associated symptoms: myalgias, chills, nasal congestion, cough, fatigue, headaches, rhinorrhea, postnasal drip, sinus pain, sore throat and nasal pain    Associated symptoms: no abdominal pain, no appetite change, no drooling, no ear pain, no facial swelling, no fever, no mouth sores, no neck stiffness, no rash, no diaphoresis, no swollen glands, no chest pain, no chest tightness, no nocturnal dyspnea, no shortness of breath, no  sneezing, no syncope, no unexpected weight change, no wheezing, no decreased sense of smell/taste, no foreign body, no nosebleeds, no other, no nausea, no diarrhea, no ear redness, no ear edema, no hearing loss, no tinnitus and no vomiting    Treatments tried: over-the-counter medication    Response to Treatment: No change    Special considerations: none apply             Review of Systems   Constitutional:  Positive for chills and fatigue. Negative for appetite change, diaphoresis, fever and unexpected weight change.   HENT:  Positive for congestion, postnasal drip, rhinorrhea, sinus pain and sore throat. Negative for drooling, ear pain, facial swelling, hearing loss, mouth sores, nosebleeds, sneezing and tinnitus.    Respiratory:  Positive for cough. Negative for chest tightness, shortness of breath and wheezing.    Cardiovascular:  Negative for chest pain.   Gastrointestinal:  Negative for abdominal pain, diarrhea, nausea and vomiting.   Musculoskeletal:  Positive for myalgias. Negative for neck stiffness.   Skin:  Negative for rash.   Neurological:  Positive for headaches. Negative for syncope.       Social History     Tobacco Use   Smoking Status Never   . Passive exposure: Never   Smokeless Tobacco Never     Past Medical History:   Diagnosis Date   . Disease of thyroid gland    . Epilepsy    . GI bleed    . Hyperlipidemia    . Hypothyroid    . Migraine headache    . Migraines    . Muscle strain 2016   . Paroxysmal supraventricular tachycardia    . Perforated sigmoid colon    . Seizures      Past Surgical History:   Procedure Laterality Date   . COLON SURGERY     . COLONOSCOPY      subsequent perforation   . HIP ARTHROPLASTY Bilateral    . KNEE ARTHROPLASTY     . LAMINECTOMY     . RECONSTRUCTION CLEFT FOOT  2023     Family History   Problem Relation Age of Onset   . Lung cancer Mother         no history tobacco use   . Heart disease Father    . Hypertension Neg Hx    . Hyperlipidemia Neg Hx    . Diabetes Neg Hx         Objective        Physical Exam  Vitals reviewed.   Constitutional:       Appearance: Normal appearance.   HENT:      Head: Normocephalic.      Right Ear: Tympanic membrane, ear canal and external ear normal.      Left Ear: Tympanic membrane, ear canal and external ear normal.      Nose: Nose normal.      Mouth/Throat:      Mouth: Mucous membranes are moist.  Pharynx: Oropharynx is clear.   Eyes:      Extraocular Movements: Extraocular movements intact.      Conjunctiva/sclera: Conjunctivae normal.      Pupils: Pupils are equal, round, and reactive to light.   Cardiovascular:      Rate and Rhythm: Normal rate and regular rhythm.      Pulses: Normal pulses.      Heart sounds: Normal heart sounds.   Pulmonary:      Effort: Pulmonary effort is normal. No respiratory distress.      Breath sounds: No stridor. Wheezing present. No rhonchi or rales.   Chest:      Chest wall: No tenderness.   Musculoskeletal:         General: Normal range of motion.      Cervical back: Normal range of motion and neck supple.   Skin:     General: Skin is warm.      Capillary Refill: Capillary refill takes less than 2 seconds.   Neurological:      General: No focal deficit present.      Mental Status: She is alert and oriented to person, place, and time. Mental status is at baseline.   Psychiatric:         Mood and Affect: Mood normal.         Behavior: Behavior normal.         Thought Content: Thought content normal.         Judgment: Judgment normal.               Assessment  HPI provided by Self    Based on today's visit:history, physical exam and all relevant testing completed in clinic today  patient's visit diagnosis is/includes   1. Non-recurrent acute suppurative otitis media of right ear without spontaneous rupture of tympanic membrane    2. Acute non-recurrent pansinusitis    3. Shortness of breath    4. Hypothyroidism, unspecified type    5. Cough, unspecified type    6. Acute pharyngitis, unspecified etiology    7.  Hyperlipemia, mixed      Patient does not have a history of chronic conditions.    Treatment plan includes:   Plan  Orders Placed:  Orders Placed This Encounter   Procedures   . Strep Molecular POCT   . LumiraDX SARS-COV-2 Rapid Result Antigen Test     Medications ordered this visit     Signed Prescriptions Disp Refills   . albuterol (VENTOLIN HFA) 90 mcg/actuation inhaler 1 each 0     Sig: Inhale 1-2 puffs Every 4-6 hours as needed for wheezing for up to 30 days   . amoxicillin-clavulanate (AUGMENTIN) 875-125 mg tablet 20 tablet 0     Sig: Take 1 tablet by mouth 2 (two) times a day for 10 days   . predniSONE (DELTASONE) 20 MG tablet 1 tablet 0     Sig: Take 1 tablet (20 mg total) by mouth daily for 1 day       Current medication list and any new medications prescribed or recommended today were reviewed with the patient and specific instructions were provided Yes    Provider Recommendations:    1. Non-recurrent acute suppurative otitis media of right ear without spontaneous rupture of tympanic membrane    - amoxicillin-clavulanate (AUGMENTIN) 875-125 mg tablet; Take 1 tablet by mouth 2 (two) times a day for 10 days  Dispense: 20 tablet; Refill: 0    Clinical presentation significant for bacterial  infection.  Antibiotics prescribed in accordance with [facility] guidelines. Instructed not to stop medication early even if feeling better, and to complete the course of antibiotic. Educated to stop antibiotic only for allergic reactions such as rash, swelling, and hives. Instruction provided about medication side effects such as diarrhea, bloating, and indigestion and to take probiotic during antibiotic treatment to reduce the negative effects such as diarrhea and replenish the gut flora. Educated to follow up with PCP or [facility] Provider if symptoms do not improve or worsen. Instruction to go to the nearest ED if develop any signs of distress.      2. Acute non-recurrent pansinusitis    - amoxicillin-clavulanate  (AUGMENTIN) 875-125 mg tablet; Take 1 tablet by mouth 2 (two) times a day for 10 days  Dispense: 20 tablet; Refill: 0    Diagnosis of bacterial rhinosinusitis made based on patient history and clinical exam findings.  Antibiotics prescribed in accordance with [facility] guidelines.  Recommend OTC guaifenesin (Mucinex) and Nasal Saline Spray for congestion.  Education provided to patient: Avoid taking multi-symptom cold medications due to risk of over-medicating and examine active ingredients carefully.  Warm compresses applied to the face for 5-10 minutes at least 3 times a day may help with pain.  It may take up to two weeks for symptoms of bacterial rhinosinusitis to fully resolve.  In a child, if symptoms worsen after 3 days of beginning treatment please follow up with pediatrician.  For adults, if symptoms do not improve or worsen within 72 hours please follow up with your primary care provider or Urgent Care.  If symptoms of difficulty breathing, inability to swallow fluids or drooling saliva from the mouth, temperature >=103F, change in vision, stiff neck or sensitivity to light develop please call 911 or go immediately to the Emergency Room.       3. Shortness of breath    - albuterol (VENTOLIN HFA) 90 mcg/actuation inhaler; Inhale 1-2 puffs Every 4-6 hours as needed for wheezing for up to 30 days  Dispense: 1 each; Refill: 0    - predniSONE (DELTASONE) 20 MG tablet; Take 1 tablet (20 mg total) by mouth daily for 1 day  Dispense: 1 tablet; Refill: 0    4. Hypothyroidism, unspecified type      Patient presents with well controlledhypothyroidismat time of visit.The Thyroid has not been surgical removed. Patient's medication regimen has been appropriately documented on the medication list. Routine labs related to chronic condition have been performed within the last year. Reinforced management strategies and reviewed considerations for current treatment, within the context of the underlying condition as well  as chronic conditions.        5. Cough, unspecified type    - predniSONE (DELTASONE) 20 MG tablet; Take 1 tablet (20 mg total) by mouth daily for 1 day  Dispense: 1 tablet; Refill: 0    6. Acute pharyngitis, unspecified etiology    - Strep Molecular POCT  - LumiraDX SARS-COV-2 Rapid Result Antigen Test    7. Hyperlipemia, mixed    Patient presents with well controlled HyperlipidemiaMixed  at time of visit.Patient's medication regimen has been appropriately documented on the medication list. Routine labs related to chronic condition have been performed within the last year. Reinforced management strategies and reviewed considerations for current treatment, within the context of the underlying condition as well as chronic conditions.        Follow up care instructions were provided and reviewed?with the  Patient. All questions were answered. Patient verbalized  understanding of plan of care today.    I have reviewed with the patient the importance of following up with a Primary Care Provider (PCP) for overall management of preventative and chronic care needs. : Yes, PHQ-2 Total Score: 0, This patient Mini-Cog tests:: 5

## 2023-01-09 ENCOUNTER — Telehealth (HOSPITAL_BASED_OUTPATIENT_CLINIC_OR_DEPARTMENT_OTHER): Payer: Self-pay

## 2023-01-09 NOTE — Progress Notes (Signed)
Jennifer [name] is a 67 y.o. year old female  S (Subjective): Pt actively participated in group activity about sleep and weight lossand was pleased with weight this visit.  Pt continues to be interested in [facility] to improve co-morbid conditions.     O (Objective):    Wt Readings from Last 10 Encounters:   01/02/23 111.6 kg (246 lb)   12/26/22 112.5 kg (248 lb)   12/17/22 111.3 kg (245 lb 6.4 oz)   10/15/22 109.3 kg (241 lb)   07/12/22 108 kg (238 lb)   09/25/21 117.8 kg (259 lb 12.8 oz)   10/17/20 102.1 kg (225 lb)   02/12/20 97.1 kg (214 lb)           A (Assessment): Patient with the condition of obesity (can add in BMI smart phrase). Patient continues to demonstrate motivation to lose weight and change behaviors.    -Class focused on exercise and physical activity, Sleep, Basil Metabolic Rate, weight loss quality, and post op exercise.      P (Plan):   Return in 1 month for additional class if required.   Homework: Increase exercise time.   Improve duration of sleep  Pt will continue goal setting, class participation, and making small but measurable improvements in 1-2 months      Educated pt for 30 minutes in a group setting. Plan reviewed with surgeon.       Tinnie Gens Physiologist,MPH

## 2023-01-15 ENCOUNTER — Ambulatory Visit (FREE_STANDING_LABORATORY_FACILITY): Payer: Medicare Other | Admitting: Internal Medicine

## 2023-01-15 ENCOUNTER — Encounter (INDEPENDENT_AMBULATORY_CARE_PROVIDER_SITE_OTHER): Payer: Self-pay

## 2023-01-15 ENCOUNTER — Encounter (INDEPENDENT_AMBULATORY_CARE_PROVIDER_SITE_OTHER): Payer: Self-pay | Admitting: Internal Medicine

## 2023-01-15 VITALS — BP 132/85 | HR 79 | Temp 97.8°F | Wt 245.0 lb

## 2023-01-15 DIAGNOSIS — R7309 Other abnormal glucose: Secondary | ICD-10-CM

## 2023-01-15 DIAGNOSIS — I471 Supraventricular tachycardia, unspecified: Secondary | ICD-10-CM

## 2023-01-15 DIAGNOSIS — Z01818 Encounter for other preprocedural examination: Secondary | ICD-10-CM

## 2023-01-15 DIAGNOSIS — G40909 Epilepsy, unspecified, not intractable, without status epilepticus: Secondary | ICD-10-CM

## 2023-01-15 DIAGNOSIS — Z6841 Body Mass Index (BMI) 40.0 and over, adult: Secondary | ICD-10-CM

## 2023-01-15 DIAGNOSIS — E039 Hypothyroidism, unspecified: Secondary | ICD-10-CM

## 2023-01-15 DIAGNOSIS — E785 Hyperlipidemia, unspecified: Secondary | ICD-10-CM

## 2023-01-15 LAB — HEMOGLOBIN A1C
Average Estimated Glucose: 122.6 mg/dL
Hemoglobin A1C: 5.9 % — ABNORMAL HIGH (ref 4.6–5.6)

## 2023-01-15 NOTE — Progress Notes (Signed)
Subjective:      Patient ID: Jennifer [name] is a 67 y.o. female.    Chief Complaint:  Chief Complaint   Patient presents with    Pre-op Exam     Patient is planning bariatric surgery         HPI:  HPI  Pt is here for a pre-op medical clearance.     To have sleeve gastrectomy for weight management, with Dr. Rosaland Lao.  DOS has yet to be scheduled.     To have card clearance 01/25/23,   EGD 02/06/23    Hx SVT, no palp, sees Dr. Aneta Mins,   No syncope, no lightheadedness.   no cp, no sob.     Hyperlipidemia, tolerating med, no myalgia,     Abnormal glucose, diet controlled.   Lab Results   Component Value Date    HGBA1C 5.9 (H) 10/19/2022      Hypothyroidism, weight same, BM ok   Lab Results   Component Value Date    TSH 4.55 07/12/2022      Seizure d/o, stable with meds, sees neuro, cleared by neuro for surgery.     Problem List:  Problem List[1]    Current Medications:  Current Medications[2]    Allergies:  Allergies[3]    Past Medical History:  Medical History[4]    Past Surgical History:  Past Surgical History:   Procedure Laterality Date    ADENOIDECTOMY      APPENDECTOMY (OPEN)  2009    ARTHROPLASTY, KNEE, TOTAL Left 02/12/2020    Procedure: LEFT ARTHROPLASTY, KNEE, TOTAL;  Surgeon: Harvie Bridge, MD;  Location: [facility];  Service: Orthopedics;  Laterality: Left;    BACK SURGERY  1973    lumbar    CARDIAC ABLATION  2018    SVT    CHOLECYSTECTOMY  2018    COLON SURGERY  2008    resection, perforated colon with colonoscopy    COLONOSCOPY, DIAGNOSTIC (SCREENING)      JOINT REPLACEMENT      bilat hips, L 2005, R 2010, right knee 2015    OTHER SURGICAL HISTORY  2018    IVC filter    SMALL INTESTINE SURGERY  2007    Colon resection post colonoscopy    SPINE SURGERY  11/26/1971    TONSILLECTOMY         Family History:  Family History   Problem Relation Age of Onset    Cancer Mother         lung    Arthritis Mother     Heart disease Mother         A fib/ chf    Stroke Mother     Heart attack Father 58    Cancer  Son         Thyroid    Cancer Maternal Uncle        Social History:  Social History[5]     The following sections were reviewed this encounter by the provider:   Allergies  Meds  Problems  Med Hx  Surg Hx         ROS:  Review of Systems   Constitutional:  Negative for appetite change, chills, fever and unexpected weight change.   Respiratory:  Negative for cough, shortness of breath and wheezing.    Cardiovascular:  Negative for chest pain and palpitations.   Gastrointestinal:  Negative for abdominal pain, constipation, diarrhea, nausea and vomiting.   Genitourinary:  Negative for dysuria, frequency and hematuria.  Musculoskeletal:  Negative for arthralgias, joint swelling and myalgias.   Neurological:  Negative for syncope, light-headedness and headaches.   Hematological:  Does not bruise/bleed easily.     No prior problem with anesthesia    Vitals:  BP 132/85 (BP Site: Left arm, Patient Position: Sitting)   Pulse 79   Temp 97.8 F (36.6 C) (Oral)   Wt 111.1 kg (245 lb)   BMI 44.81 kg/m      Objective:     Physical Exam:  Physical Exam  Vitals reviewed.   Constitutional:       General: She is not in acute distress.     Appearance: She is well-developed. She is obese.   Eyes:      General: No scleral icterus.     Conjunctiva/sclera: Conjunctivae normal.   Neck:      Thyroid: No thyromegaly.      Vascular: No carotid bruit or JVD.   Cardiovascular:      Rate and Rhythm: Normal rate and regular rhythm.      Heart sounds: Normal heart sounds. No murmur heard.     No friction rub. No gallop.   Pulmonary:      Effort: Pulmonary effort is normal. No respiratory distress.      Breath sounds: Normal breath sounds. No rales.   Abdominal:      General: Bowel sounds are normal. There is no distension.      Palpations: Abdomen is soft.      Tenderness: There is no abdominal tenderness. There is no guarding or rebound.   Musculoskeletal:      Cervical back: Neck supple.      Right lower leg: No edema.      Left lower  leg: No edema.   Neurological:      Mental Status: She is alert.          Assessment:     1. Pre-op examination    2. Class 3 severe obesity with serious comorbidity and body mass index (BMI) of 40.0 to 44.9 in adult, unspecified obesity type    3. Hyperlipidemia LDL goal <100    4. Hypothyroidism (acquired)  - T4, Free; Future  - TSH; Future  - T3, Free; Future  - T4, Free  - TSH  - T3, Free    5. Abnormal glucose  - Hemoglobin A1C; Future  - Hemoglobin A1C    6. Seizure disorder    7. SVT (supraventricular tachycardia)      Plan:     Labs ordered  Pre op labs to be done later.   Cardiac clearance as per cardiologist  Cleared for surgery pending cardiac evaluation .  Keep routine f/u ov.       Excell Seltzer, MD        [1]   Patient Active Problem List  Diagnosis    Primary osteoarthritis of left knee    Mass of right breast, unspecified quadrant    Ventricular tachyarrhythmia    Hypertriglyceridemia    Hypothyroidism, unspecified type    DDD (degenerative disc disease), lumbosacral    Prediabetes    Class 3 severe obesity with serious comorbidity and body mass index (BMI) of 40.0 to 44.9 in adult, unspecified obesity type    Seizure disorder    SVT (supraventricular tachycardia)    History of colon resection    History of cholecystectomy    History of maternal deep vein thrombosis (DVT)   [2]   Current Outpatient Medications  Medication Sig Dispense Refill    aspirin 325 MG tablet Take 1 tablet (325 mg) by mouth daily      clonazePAM (KlonoPIN) 1 MG tablet Take 1 tablet (1 mg) by mouth 3 (three) times daily as needed      Ergocalciferol (VITAMIN D2 PO) Take by mouth daily      Keppra XR 750 MG extended release 24 hr tablet Take 2 tablets (1,500 mg) by mouth 2 (two) times daily      Lacosamide (Vimpat) 200 MG Tab Take by mouth 2 (two) times daily      levothyroxine (SYNTHROID) 100 MCG tablet TAKE ONE TABLET BY MOUTH WEEKLY ON SUNDAY 15 tablet 0    levothyroxine (SYNTHROID) 88 MCG tablet TAKE ONE TABLET BY MOUTH EVERY  DAY  EXCEPT SUNDAY 90 tablet 0    metoprolol succinate XL (TOPROL-XL) 25 MG 24 hr tablet Take 1 tablet (25 mg) by mouth daily 90 tablet 0    Omega-3 Fatty Acids (Fish Oil) 500 MG Cap Take 1 capsule (500 mg) by mouth      rosuvastatin (CRESTOR) 5 MG tablet Take 1 tablet (5 mg) by mouth every morning      triamterene-hydrochlorothiazide (DYAZIDE) 37.5-25 MG per capsule Take 1 capsule by mouth every morning 180 capsule 0     No current facility-administered medications for this visit.   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    Epilepsy    Pre-diabetes    [5]   Social History  Socioeconomic History    Marital status: Married    Number of children: 3   Occupational History    Occupation: retired Charity fundraiser   Tobacco Use    Smoking status: Never     Passive exposure: Past    Smokeless tobacco: Never   Vaping Use    Vaping status: Never Used   Substance and Sexual Activity    Alcohol use: Yes     Comment: rarely- holidays    Drug use: Not Currently    Sexual activity: Yes     Partners: Male     Birth control/protection: Post-menopausal   Other Topics Concern    Eats large amounts No    Excessive Sweets Yes    Skips meals Yes    Eats excessive starches Yes    Snacks or grazes Yes    Emotional eater Yes    Eats fried food Yes    Eats fast food Yes    Diet Center No    Doylene Bode No    [facility] Weight [facility] No    Nutri-System Yes    Opti-Fast / Medi-Fast No    Overeaters  Anonymous No    [facility] Yes    TOPS  No    Weight Watchers Yes    Atkins No    Binging / Purging No    Calorie Counting No    Fasting No    High Protein No    Low Carb Yes    Low Fat Yes    [facility] Diet No    Slim Fast No    [place] No    Stationary cycle or treadmill No    Gym/fitness Classes No    Home exercise/video No    Swimming No    Weight training No    Walking or running No    Hospitalization No    Hypnosis No    Physical therapy No    Psychological therapy No    Residential program No    Acutrim No    [place] No    Contrave No    Dexatrim No    Diethylpropion No    Fastin No    Fen - Phen No    Ionamin / Adipex No    Phentermine No    Qsymia No    Prozac No    Saxenda No    Topamax No    Wellbutrin No    Xenical (Orlistat, Alli) No    Other Med Yes     Comment: oxempic    No impairment No    Walks with cane/crutch No    Requires a wheelchair No    Bedridden No    Are you currently being treated for depression? No    Do you snore? Yes    Are you receiving any medical or psychological services? No    Do you ever wake up at night gasping for breath? Yes    Do you have or have you been treated for an eating disorder? No    Anyone ever told you that you stop breathing while asleep? Yes    Do you exercise regularly? No    Have you or family member ever have trouble with anesthesia? No     Social Determinants of Health     Financial Resource Strain: Low Risk  (12/15/2022)    Overall Financial Resource Strain (CARDIA)     Difficulty of Paying Living Expenses: Not very hard   Food Insecurity: No Food Insecurity (12/15/2022)    Hunger Vital Sign     Worried About Running Out of Food in the Last Year: Never true     Ran Out of Food in the Last Year: Never true   Transportation Needs: No Transportation Needs (12/15/2022)    PRAPARE - Therapist, art (Medical): No     Lack of Transportation (Non-Medical): No   Physical Activity: Insufficiently Active (12/15/2022)     Exercise Vital Sign     Days of Exercise per Week: 1 day     Minutes of Exercise per Session: 60 min   Stress: No Stress Concern Present (12/15/2022)    [employer] of Occupational Health - Occupational Stress Questionnaire     Feeling of Stress : Not at all   Social Connections: Moderately Integrated (12/15/2022)    Social Connection and Isolation Panel [NHANES]     Frequency of Communication with Friends and Family: More than three times a week     Frequency of Social Gatherings with Friends and Family: More than three times a week     Attends Religious Services: More than 4 times per year     Active Member of  Clubs or Organizations: No     Attends Banker Meetings: Never     Marital Status: Married   Catering manager Violence: Unknown (12/15/2022)    Humiliation, Afraid, Rape, and Kick questionnaire     Fear of Current or Ex-Partner: No     Emotionally Abused: Patient declined     Physically Abused: Patient declined     Sexually Abused: No   Housing Stability: Low Risk  (12/15/2022)    Housing Stability Vital Sign     Unable to Pay for Housing in the Last Year: No     Number of Times Moved in the Last Year: 0     Homeless in the Last Year: No

## 2023-01-15 NOTE — Progress Notes (Signed)
Have you seen any specialists/other providers since your last visit with [place]?    Yes, [facility] 01/05/23    Health Maintenance Due   Topic Date Due    Advance Directive on File  Never done    Medicare Annual Wellness Visit  Never done    Shingrix Vaccine 50+ (1) Never done    COVID-19 Vaccine (6 - 2023-24 season) 06/18/2022    INFLUENZA VACCINE  12/27/2022

## 2023-01-16 ENCOUNTER — Other Ambulatory Visit (INDEPENDENT_AMBULATORY_CARE_PROVIDER_SITE_OTHER): Payer: Self-pay | Admitting: Family Medicine

## 2023-01-16 ENCOUNTER — Other Ambulatory Visit (INDEPENDENT_AMBULATORY_CARE_PROVIDER_SITE_OTHER): Payer: Self-pay | Admitting: Internal Medicine

## 2023-01-16 LAB — T3, FREE: T3 Free: 2.62 pg/mL (ref 1.71–3.71)

## 2023-01-16 LAB — T4, FREE: T4 Free: 0.96 ng/dL (ref 0.69–1.48)

## 2023-01-16 LAB — TSH: TSH: 3.01 u[IU]/mL (ref 0.35–4.94)

## 2023-01-16 NOTE — Progress Notes (Signed)
Thyroid level is normal  Hba1c is borderline high,     Rec:  1. Continue meds  2. Low carb, low cholesterol, low calorie diet, exercise, weight reduction  3. Keep follow up visit

## 2023-01-17 ENCOUNTER — Encounter (INDEPENDENT_AMBULATORY_CARE_PROVIDER_SITE_OTHER): Payer: Self-pay

## 2023-01-17 MED ORDER — METOPROLOL SUCCINATE ER 25 MG PO TB24
25.0000 mg | ORAL_TABLET | Freq: Every day | ORAL | 0 refills | Status: DC
Start: 2023-01-17 — End: 2023-02-03

## 2023-01-17 MED ORDER — LEVOTHYROXINE SODIUM 88 MCG PO TABS
ORAL_TABLET | ORAL | 0 refills | Status: DC
Start: 2023-01-17 — End: 2023-02-07

## 2023-01-19 ENCOUNTER — Other Ambulatory Visit (INDEPENDENT_AMBULATORY_CARE_PROVIDER_SITE_OTHER): Payer: Self-pay | Admitting: Family Medicine

## 2023-01-23 ENCOUNTER — Encounter (INDEPENDENT_AMBULATORY_CARE_PROVIDER_SITE_OTHER): Payer: Self-pay

## 2023-01-25 ENCOUNTER — Encounter (INDEPENDENT_AMBULATORY_CARE_PROVIDER_SITE_OTHER): Payer: Self-pay | Admitting: Surgery

## 2023-01-30 ENCOUNTER — Encounter (INDEPENDENT_AMBULATORY_CARE_PROVIDER_SITE_OTHER): Payer: Self-pay

## 2023-01-30 ENCOUNTER — Telehealth (HOSPITAL_BASED_OUTPATIENT_CLINIC_OR_DEPARTMENT_OTHER): Payer: Self-pay

## 2023-01-30 NOTE — Progress Notes (Signed)
Jennifer [name] is a 67 y.o. year old female  S (Subjective): Pt actively participated in group activity and was pleased with weight this visit.  Pt continues to be interested in [facility] to improve co-morbid conditions.     O (Objective):    Wt Readings from Last 10 Encounters:   01/15/23 111.1 kg (245 lb)   01/02/23 111.6 kg (246 lb)   12/26/22 112.5 kg (248 lb)   12/17/22 111.3 kg (245 lb 6.4 oz)   10/15/22 109.3 kg (241 lb)   07/12/22 108 kg (238 lb)   09/25/21 117.8 kg (259 lb 12.8 oz)   10/17/20 102.1 kg (225 lb)   02/12/20 97.1 kg (214 lb)           A (Assessment): Patient with the condition of obesity (can add in BMI smart phrase). Patient continues to demonstrate motivation to lose weight and change behaviors.    -Class focused on exercise and physical activity, Target Heart Rate Zone, Perceived Exertion, Strength training, aerobic activity, and post op exercise.      P (Plan):   Return in 1 month for additional class if required.   Homework: Increase exercise time.   Pt will continue goal setting, class participation, and making small but measurable improvements in 1-2 months      Educated pt for 30 minutes in a group setting. Plan reviewed with surgeon.       Tinnie Gens Physiologist,MPH

## 2023-01-31 ENCOUNTER — Encounter (INDEPENDENT_AMBULATORY_CARE_PROVIDER_SITE_OTHER): Payer: Self-pay | Admitting: Internal Medicine

## 2023-01-31 MED ORDER — ROSUVASTATIN CALCIUM 5 MG PO TABS
5.0000 mg | ORAL_TABLET | Freq: Every morning | ORAL | 0 refills | Status: AC
Start: 2023-01-31 — End: ?

## 2023-01-31 NOTE — Addendum Note (Signed)
Addended by: Glenice Laine on: 01/31/2023 03:13 PM     Modules accepted: Orders

## 2023-02-01 ENCOUNTER — Telehealth (INDEPENDENT_AMBULATORY_CARE_PROVIDER_SITE_OTHER): Payer: Self-pay

## 2023-02-01 ENCOUNTER — Encounter (INDEPENDENT_AMBULATORY_CARE_PROVIDER_SITE_OTHER): Payer: Self-pay

## 2023-02-01 ENCOUNTER — Ambulatory Visit (INDEPENDENT_AMBULATORY_CARE_PROVIDER_SITE_OTHER): Payer: Medicare Other

## 2023-02-01 DIAGNOSIS — Z6841 Body Mass Index (BMI) 40.0 and over, adult: Secondary | ICD-10-CM

## 2023-02-01 DIAGNOSIS — Z713 Dietary counseling and surveillance: Secondary | ICD-10-CM

## 2023-02-01 DIAGNOSIS — Z719 Counseling, unspecified: Secondary | ICD-10-CM

## 2023-02-01 NOTE — PSS Phone Screening (Signed)
Pre-Anesthesia Evaluation    Pre-op phone visit requested by: Tommye Standard, MD  Reason for pre-op phone visit: Patient anticipating ESOPHAGOGASTRODUODENOSCOPY (EGD), BIOPSY procedure. 02/06/2023    Language Assistant  Interpreter: N/A - English is preferred language    No orders of the defined types were placed in this encounter.      History of Present Illness/Summary:        Problem List:  Medical Problems       Hospital Problem List  Date Reviewed: 01/15/2023   None        Non-Hospital Problem List  Date Reviewed: 01/15/2023            ICD-10-CM Priority Class Noted Diagnosed    Primary osteoarthritis of left knee M17.12   02/12/2020     Mass of right breast, unspecified quadrant N63.10   09/25/2021     Ventricular tachyarrhythmia I47.20   04/06/2022     Hypertriglyceridemia E78.1   04/06/2022     Hypothyroidism, unspecified type E03.9   04/06/2022     DDD (degenerative disc disease), lumbosacral M51.37   12/26/2022     Prediabetes R73.03   12/26/2022     Class 3 severe obesity with serious comorbidity and body mass index (BMI) of 40.0 to 44.9 in adult, unspecified obesity type E66.01, Z68.41   12/26/2022     Seizure disorder G40.909   12/26/2022     SVT (supraventricular tachycardia) I47.10   12/26/2022     History of colon resection Z90.49   12/26/2022     History of cholecystectomy Z90.49   12/26/2022     History of maternal deep vein thrombosis (DVT) Z86.718, Z87.59   12/26/2022         Medical History   Diagnosis Date    Abnormal vision     glasses    Arrhythmia 2018    SVT (had ablation)    Arthritis     Complication of anesthesia     urinary retention with every joint replacement, BP drop with spinal X1, another time spinal didn't work    Convulsions     epilepsy, last seizure 2010    Deep venous thrombosis of distal lower extremity 2018    after cholecystectomy; left leg    Diverticulitis     takes miralax    Encounter for blood transfusion 2018    Gastric ulceration 2018    GI bleed with requiring 4 units PRBCs per  pt    Hyperlipidemia     Hypothyroidism     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    Epilepsy    Pre-diabetes      Past Surgical History:   Procedure Laterality Date    ADENOIDECTOMY      APPENDECTOMY (OPEN)  2009    ARTHROPLASTY, KNEE, TOTAL Left 02/12/2020    Procedure: LEFT ARTHROPLASTY, KNEE, TOTAL;  Surgeon: Harvie Bridge, MD;  Location: [facility];  Service: Orthopedics;  Laterality: Left;    BACK SURGERY  1973    lumbar    CARDIAC ABLATION  2018    SVT    CHOLECYSTECTOMY  2018    COLON SURGERY  2008    resection, perforated colon with colonoscopy    COLONOSCOPY, DIAGNOSTIC (SCREENING)      JOINT REPLACEMENT      bilat hips, L 2005, R 2010, right knee 2015    OTHER SURGICAL HISTORY  2018    IVC filter    SMALL INTESTINE SURGERY  2007    Colon resection  post colonoscopy    SPINE SURGERY  11/26/1971    TONSILLECTOMY          Medication List            Accurate as of February 01, 2023  1:23 PM. Always use your most recent med list.                aspirin 325 MG tablet  Take 1 tablet (325 mg) by mouth daily  Medication Adjustments for Surgery: Stop now     clonazePAM 1 MG tablet  Take 1 tablet (1 mg) by mouth 3 (three) times daily as needed  Commonly known as: KlonoPIN  Medication Adjustments for Surgery: Take morning of surgery     Fish Oil 500 MG Caps  Take 1 capsule (500 mg) by mouth  Medication Adjustments for Surgery: Stop now     Keppra XR 750 MG extended release 24 hr tablet  Take 2 tablets (1,500 mg) by mouth 2 (two) times daily  Generic drug: LevETIRAcetam  Medication Adjustments for Surgery: Take morning of surgery     * levothyroxine 100 MCG tablet  TAKE ONE TABLET BY MOUTH WEEKLY ON SUNDAY  Commonly known as: SYNTHROID  Medication Adjustments for Surgery: Take as prescribed     * levothyroxine 88 MCG tablet  TAKE ONE TABLET BY MOUTH EVERY DAY  EXCEPT SUNDAY  Commonly known as: SYNTHROID  Medication Adjustments for Surgery: Take morning of surgery     metoprolol succinate XL 25 MG 24 hr tablet  Take 1 tablet  (25 mg) by mouth daily  Commonly known as: TOPROL-XL  Medication Adjustments for Surgery: Take morning of surgery     rosuvastatin 5 MG tablet  Take 1 tablet (5 mg) by mouth every morning  Commonly known as: CRESTOR  Medication Adjustments for Surgery: Take as prescribed     triamterene-hydrochlorothiazide 37.5-25 MG per capsule  TAKE ONE CAPSULE BY MOUTH EVERY MORNING  Commonly known as: DYAZIDE  Medication Adjustments for Surgery: Hold day of surgery     Vimpat 200 MG Tabs  Take by mouth 2 (two) times daily  Generic drug: Lacosamide  Medication Adjustments for Surgery: Take morning of surgery     Vitamin D3 2000 UNIT capsule  Take 1 capsule (2,000 Units) by mouth daily  Medication Adjustments for Surgery: Hold day of surgery           * This list has 2 medication(s) that are the same as other medications prescribed for you. Read the directions carefully, and ask your doctor or other care provider to review them with you.                Allergies[1]  Family History   Problem Relation Age of Onset    Cancer Mother         lung    Arthritis Mother     Heart disease Mother         A fib/ chf    Stroke Mother     Heart attack Father 59    Cancer Son         Thyroid    Cancer Maternal Uncle      Social History     Occupational History    Occupation: retired Charity fundraiser   Tobacco Use    Smoking status: Never     Passive exposure: Past    Smokeless tobacco: Never   Vaping Use    Vaping status: Never Used   Substance and Sexual  Activity    Alcohol use: Yes     Comment: rarely- holidays    Drug use: Not Currently    Sexual activity: Yes     Partners: Male     Birth control/protection: Post-menopausal       Menstrual History:   LMP / Status  Postmenopausal     No LMP recorded. Patient is postmenopausal.    Tubal Ligation?  No valid surgical or medical questions entered.           Exam Scores:   SDB score  OSA Risk Category: At Risk        STBUR score       PONV score  Nausea Risk: MODERATE RISK    MST score  MST Score: 0    PEN-FAST  score       Frailty score  CFS Score: 3    CHADsVasc            Visit Vitals  Ht 1.575 m (5\' 2" )   Wt 111.1 kg (245 lb)   BMI 44.81 kg/m   Obesity class 3 based on BMI.        Recent Labs   BMP (last 180 days) 10/19/22  0821   Glucose 116*   BUN 18.0   Creatinine 0.8   Sodium 142   Potassium 4.2   Chloride 105   CO2 26   Calcium 9.6   Anion Gap 11.0   eGFR >60.0         Recent Labs   Other (last 180 days) 10/19/22  0821 01/15/23  1506   TSH  --  3.01   Bilirubin, Total 0.3  --    ALT 16  --    AST (SGOT) 13  --    Protein, Total 6.2  --    Hemoglobin A1C 5.9* 5.9*                      [1] No Known Allergies

## 2023-02-01 NOTE — Progress Notes (Signed)
Class conducted via [employer] app.     Eating Out After [facility]    S & O: Patient reports she has lost 3 pounds since the previous class. Patient reports she has met with previous class goals.  Pt actively participated in group activity and was pleased with weight this visit.  Pt continues to be interested in [facility] to improve co-morbid conditions.    Previous Wt:   Wt Readings from Last 10 Encounters:   02/01/23 245 lb   01/15/23 245 lb   01/02/23 246 lb   12/26/22 248 lb   12/17/22 245 lb 6.4 oz   10/15/22 241 lb   07/12/22 238 lb   09/25/21 259 lb 12.8 oz   10/17/20 225 lb   02/12/20 214 lb       Nutrition Assessment and Diagnosis: Pt with the condition of morbid obesity (There is no height or weight on file to calculate BMI.) and co-morbidities with ongoing nutrition knowledge deficit as evidenced by initial report;  active participation; weight loss from previous visit.  Patient continues to demonstrate motivation to lose weight and change behaviors. Patient has mild nutrition knowledge deficit as evidenced per initial report and continued elevated BMI. Patient was educated during the group session on how to read [employer], making smart choices and how to manage portions at restaurants.  Discussion focused on:     1. Behavior Modification for weight loss  2. Identification of high fat/high calorie foods that are found at restaurants.  3. Personal planning of how to choose appropriate foods from a variety of restaurants post-op.  4. How to effectively read a restaurant menu to choose bariatric-friendly foods.    P.    Return in 1 month for additional class (Nutrition 101) with completed homework.  Homework included:  Patient to review restaurant menus more thoroughly and begin choosing lower fat and lower calorie meals.  Pt will con't goal setting per pt report/active previous class participation  and will con't to make small but measureable improvement in meal pattern/ choices/ and exercise habits.       Educated pt for 30 minutes in a group setting.  Plan reviewed with surgeon.

## 2023-02-02 ENCOUNTER — Other Ambulatory Visit (INDEPENDENT_AMBULATORY_CARE_PROVIDER_SITE_OTHER): Payer: Self-pay | Admitting: Internal Medicine

## 2023-02-05 ENCOUNTER — Encounter (INDEPENDENT_AMBULATORY_CARE_PROVIDER_SITE_OTHER): Payer: Self-pay

## 2023-02-05 NOTE — Progress Notes (Signed)
EKG 01/25/23 media, cardiology note 01/25/23 under encounters

## 2023-02-05 NOTE — Anesthesia Preprocedure Evaluation (Signed)
Relevant Problems   NEURO/PSYCH   (+) Seizure disorder      CARDIO   (+) SVT (supraventricular tachycardia)      ENDO   (+) Hypothyroidism, unspecified type       PSS Anesthesia Comments: BMI 44.81, h/o SVT, Hypothyroidism         Anesthesia Plan    ASA 3                                              Signed by: Bettye Boeck, MD 02/05/23 3:32 PM

## 2023-02-06 ENCOUNTER — Encounter: Payer: Self-pay | Admitting: Surgery

## 2023-02-06 ENCOUNTER — Ambulatory Visit
Admission: RE | Admit: 2023-02-06 | Discharge: 2023-02-06 | Disposition: A | Discharge status: Home / Self Care | Payer: Medicare Other | Source: Ambulatory Visit | Attending: Surgery | Admitting: Surgery

## 2023-02-06 ENCOUNTER — Ambulatory Visit: Payer: Medicare Other | Admitting: Pain Medicine

## 2023-02-06 ENCOUNTER — Encounter (INDEPENDENT_AMBULATORY_CARE_PROVIDER_SITE_OTHER): Payer: Medicare Other | Admitting: Surgery

## 2023-02-06 ENCOUNTER — Encounter
Admission: RE | Disposition: A | Discharge status: Home / Self Care | Payer: Self-pay | Source: Ambulatory Visit | Attending: Surgery

## 2023-02-06 DIAGNOSIS — K2980 Duodenitis without bleeding: Secondary | ICD-10-CM | POA: Insufficient documentation

## 2023-02-06 DIAGNOSIS — Z6841 Body Mass Index (BMI) 40.0 and over, adult: Secondary | ICD-10-CM | POA: Insufficient documentation

## 2023-02-06 DIAGNOSIS — G40909 Epilepsy, unspecified, not intractable, without status epilepticus: Secondary | ICD-10-CM | POA: Insufficient documentation

## 2023-02-06 DIAGNOSIS — E039 Hypothyroidism, unspecified: Secondary | ICD-10-CM | POA: Insufficient documentation

## 2023-02-06 DIAGNOSIS — I471 Supraventricular tachycardia, unspecified: Secondary | ICD-10-CM | POA: Insufficient documentation

## 2023-02-06 DIAGNOSIS — K3189 Other diseases of stomach and duodenum: Secondary | ICD-10-CM | POA: Insufficient documentation

## 2023-02-06 DIAGNOSIS — R12 Heartburn: Secondary | ICD-10-CM | POA: Insufficient documentation

## 2023-02-06 DIAGNOSIS — K219 Gastro-esophageal reflux disease without esophagitis: Secondary | ICD-10-CM

## 2023-02-06 HISTORY — PX: EGD, BIOPSY: SHX3796

## 2023-02-06 SURGERY — ESOPHAGOGASTRODUODENOSCOPY (EGD), BIOPSY
Anesthesia: Anesthesia General

## 2023-02-06 MED ORDER — PROPOFOL 10 MG/ML IV EMUL (WRAP)
INTRAVENOUS | Status: AC
Start: 2023-02-06 — End: ?
  Filled 2023-02-06: qty 40

## 2023-02-06 MED ORDER — ASPIRIN 325 MG PO TABS
325.0000 mg | ORAL_TABLET | Freq: Every day | ORAL | Status: DC
Start: 2023-02-06 — End: 2023-04-27

## 2023-02-06 MED ORDER — LIDOCAINE HCL 2 % IJ SOLN
INTRAMUSCULAR | Status: DC | PRN
Start: 2023-02-06 — End: 2023-02-06
  Administered 2023-02-06: 5 mL via INTRAVENOUS

## 2023-02-06 MED ORDER — GLYCOPYRROLATE 0.2 MG/ML IJ SOLN (WRAP)
INTRAMUSCULAR | Status: DC | PRN
Start: 2023-02-06 — End: 2023-02-06
  Administered 2023-02-06: .2 mg via INTRAVENOUS

## 2023-02-06 MED ORDER — FENTANYL CITRATE (PF) 50 MCG/ML IJ SOLN (WRAP)
INTRAMUSCULAR | Status: AC
Start: 2023-02-06 — End: ?
  Filled 2023-02-06: qty 2

## 2023-02-06 MED ORDER — GLYCOPYRROLATE 1 MG/5ML IJ SOLN
INTRAMUSCULAR | Status: AC
Start: 2023-02-06 — End: ?
  Filled 2023-02-06: qty 5

## 2023-02-06 MED ORDER — LACTATED RINGERS IV SOLN
INTRAVENOUS | Status: DC
Start: 2023-02-06 — End: 2023-02-06

## 2023-02-06 MED ORDER — PROPOFOL 10 MG/ML IV EMUL (WRAP)
INTRAVENOUS | Status: DC | PRN
Start: 2023-02-06 — End: 2023-02-06
  Administered 2023-02-06 (×2): 60 mg via INTRAVENOUS

## 2023-02-06 MED ORDER — FENTANYL CITRATE (PF) 50 MCG/ML IJ SOLN (WRAP)
INTRAMUSCULAR | Status: DC | PRN
Start: 2023-02-06 — End: 2023-02-06
  Administered 2023-02-06: 50 ug via INTRAVENOUS

## 2023-02-06 SURGICAL SUPPLY — 13 items
BLOCK BITE OD60 FR ADULT STRAP FLEXIBLE (Endoscopic Supplies) ×1
BLOCK BITE OD60 FR ADULT STRAP FLEXIBLE SIDEHOLE (Endoscopic Supplies) ×1 IMPLANT
CAP ENDOSCOPE SEALING HYDRA OLYMPUS L16 (GE Lab Supplies) ×1
CAP ENDOSCOPE SEALING HYDRA OLYMPUS L16 IN L18IN WATER BTTL CO2 CLIP (GE Lab Supplies) ×1 IMPLANT
FORCEPS BIOPSY L240 CM LARGE CAPACITY (Instrument) ×1
FORCEPS BIOPSY L240 CM MICROMESH TEETH STREAMLINE CATHETER NEEDLE (Instrument) ×1 IMPLANT
KIT ENDOSCOPIC L6 FT CLEAN ADAPTER (Procedure Accessories) ×1
KIT ENDOSCOPIC L6 FT CLEAN ADAPTER COMPLIANCE ENDOKIT ORCAPOD 4 1.1 OZ (Procedure Accessories) ×1 IMPLANT
MASK OXYGEN MEDIUM CONCENTRATION ADULT POM 7FT 3010318LTEZ (Other) ×1 IMPLANT
MASK POM EZ-LITE BRONCHO (Other) ×1
SPONGE GAUZE L4 IN X W4 IN 4 PLY HIGH (Sponge) ×1
SPONGE GAUZE L4 IN X W4 IN 4 PLY NONWOVEN LINT FREE CURITY RAYON (Sponge) ×1 IMPLANT
TUBING IRRIGATION HYDRA (Tubing) ×1 IMPLANT

## 2023-02-06 NOTE — H&P (Addendum)
Drs. Fermin Schwab, Blair Promise, and Pourshojae   [address], [address]   [place], [place] [address]   [phone]   [phone]     [address], [address]   [place], [place] [address]   [phone]   [phone]  ADMISSION HISTORY AND PHYSICAL EXAM    Date Time: 02/06/23 7:47 AM  Patient Name: Jennifer [name]  Attending Physician: Tommye Standard, MD    History of Present Illness:   Jennifer [name] is [name] 67 y.o. female who presents to the hospital for pre-operative endoscopy:  Sleeve with Dr. Rosaland Lao.    Past Medical History:     Past Medical History:   Diagnosis Date    Abnormal vision     glasses    Arrhythmia 2018    SVT (had ablation)    Arthritis     Complication of anesthesia     urinary retention with every joint replacement, BP drop with spinal X1, another time spinal didn't work    Convulsions     epilepsy, last seizure 2010    Deep venous thrombosis of distal lower extremity 2018    after cholecystectomy; left leg    Diverticulitis     takes miralax    Encounter for blood transfusion 2018    Gastric ulceration 2018    GI bleed with requiring 4 units PRBCs per pt    Hyperlipidemia     Hypothyroidism     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    Epilepsy    Pre-diabetes        Past Surgical History:     Past Surgical History:   Procedure Laterality Date    ADENOIDECTOMY      APPENDECTOMY (OPEN)  2009    ARTHROPLASTY, KNEE, TOTAL Left 02/12/2020    Procedure: LEFT ARTHROPLASTY, KNEE, TOTAL;  Surgeon: Harvie Bridge, MD;  Location: [facility];  Service: Orthopedics;  Laterality: Left;    BACK SURGERY  1973    lumbar    CARDIAC ABLATION  2018    SVT    CHOLECYSTECTOMY  2018    COLON SURGERY  2008    resection, perforated colon with colonoscopy    COLONOSCOPY, DIAGNOSTIC (SCREENING)      JOINT REPLACEMENT      bilat hips, L 2005, R 2010, right knee 2015    OTHER SURGICAL HISTORY  2018    IVC filter    SMALL INTESTINE SURGERY  2007    Colon resection post colonoscopy    SPINE  SURGERY  11/26/1971    TONSILLECTOMY         Family History:     Family History   Problem Relation Age of Onset    Cancer Mother         lung    Arthritis Mother     Heart disease Mother         [name] fib/ chf    Stroke Mother     Heart attack Father 20    Cancer Son         Thyroid    Cancer Maternal Uncle        Social History:     Social History     Socioeconomic History    Marital status: Married     Spouse name: Not on file    Number of children: 3    Years of education: Not on file    Highest education level: Not on file   Occupational History  Occupation: retired Charity fundraiser   Tobacco Use    Smoking status: Never     Passive exposure: Past    Smokeless tobacco: Never   Vaping Use    Vaping status: Never Used   Substance and Sexual Activity    Alcohol use: Yes     Comment: rarely- holidays    Drug use: Not Currently    Sexual activity: Yes     Partners: Male     Birth control/protection: Post-menopausal   Other Topics Concern    Dietary supplements / vitamins Not Asked    Anesthesia problems Not Asked    Blood thinners Not Asked    Pregnant Not Asked    Future Children Not Asked    Number of Pregnancies? Not Asked    Number of children Not Asked    Miscarriages / Abortions? Not Asked    Eats large amounts No    Excessive Sweets Yes    Skips meals Yes    Eats excessive starches Yes    Snacks or grazes Yes    Emotional eater Yes    Eats fried food Yes    Eats fast food Yes    Diet Center No    Doylene Bode No    LA Weight Loss No    Nutri-System Yes    Opti-Fast / Medi-Fast No    Overeaters Anonymous No    [facility] Yes    TOPS No    Weight Watchers Yes    Atkins No    Binging / Purging No    Calorie Counting No    Fasting No    High Protein No    Low Carb Yes    Low Fat Yes    [facility] Diet No    Slim Fast No    [place] No    Stationary cycle or treadmill No    Gym/fitness Classes No    Home exercise/video No    Swimming No    Weight training No    Walking or running No    Hospitalization No     Hypnosis No    Physical therapy No    Psychological therapy No    Residential program No    Acutrim No    [place] No    Contrave No    Dexatrim No    Diethylpropion No    Fastin No    Fen - Phen No    Ionamin / Adipex No    Phentermine No    Qsymia No    Prozac No    Saxenda No    Topamax No    Wellbutrin No    Xenical (Orlistat, Alli) No    Other Med Yes     Comment: oxempic    No impairment No    Walks with cane/crutch No    Requires [name] wheelchair No    Bedridden No    Are you currently being treated for depression? No    Do you snore? Yes    Are you receiving any medical or psychological services? No    Do you ever wake up at night gasping for breath? Yes    Do you have or have you been treated for an eating disorder? No    Anyone ever told you that you stop breathing while asleep? Yes    Do you exercise regularly? No    Have you or family member ever have trouble with anesthesia? No   Social  History Narrative    Not on file     Social Determinants of Health     Financial Resource Strain: Low Risk  (02/05/2023)    Overall Financial Resource Strain (CARDIA)     Difficulty of Paying Living Expenses: Not hard at all   Food Insecurity: No Food Insecurity (02/05/2023)    Hunger Vital Sign     Worried About Running Out of Food in the Last Year: Never true     Ran Out of Food in the Last Year: Never true   Transportation Needs: No Transportation Needs (02/05/2023)    PRAPARE - Therapist, art (Medical): No     Lack of Transportation (Non-Medical): No   Physical Activity: Insufficiently Active (02/05/2023)    Exercise Vital Sign     Days of Exercise per Week: 2 days     Minutes of Exercise per Session: 20 min   Stress: No Stress Concern Present (02/05/2023)    [employer] of Occupational Health - Occupational Stress Questionnaire     Feeling of Stress : Only [name] little   Social Connections: Socially Integrated (02/05/2023)    Social Connection and Isolation Panel [NHANES]     Frequency of  Communication with Friends and Family: More than three times [name] week     Frequency of Social Gatherings with Friends and Family: More than three times [name] week     Attends Religious Services: More than 4 times per year     Active Member of [employer] or Organizations: Yes     Attends Banker Meetings: 1 to 4 times per year     Marital Status: Married   Catering manager Violence: Not At Risk (02/05/2023)    Humiliation, Afraid, Rape, and Kick questionnaire     Fear of Current or Ex-Partner: No     Emotionally Abused: No     Physically Abused: No     Sexually Abused: No   Housing Stability: Low Risk  (02/05/2023)    Housing Stability Vital Sign     Unable to Pay for Housing in the Last Year: No     Number of Times Moved in the Last Year: 0     Homeless in the Last Year: No       Allergies:   Allergies[1]    Medications:     Medications Prior to Admission   Medication Sig    aspirin 325 MG tablet Take 1 tablet (325 mg) by mouth daily    Cholecalciferol (Vitamin D3) 2000 UNIT capsule Take 1 capsule (2,000 Units) by mouth daily    clonazePAM (KlonoPIN) 1 MG tablet Take 1 tablet (1 mg) by mouth 3 (three) times daily as needed    Keppra XR 750 MG extended release 24 hr tablet Take 2 tablets (1,500 mg) by mouth 2 (two) times daily    Lacosamide (Vimpat) 200 MG Tab Take by mouth 2 (two) times daily    levothyroxine (SYNTHROID) 100 MCG tablet TAKE ONE TABLET BY MOUTH WEEKLY ON SUNDAY    levothyroxine (SYNTHROID) 88 MCG tablet TAKE ONE TABLET BY MOUTH EVERY DAY  EXCEPT SUNDAY    metoprolol succinate XL (TOPROL-XL) 25 MG 24 hr tablet Take 1 tablet (25 mg) by mouth daily    Omega-3 Fatty Acids (Fish Oil) 500 MG Cap Take 1 capsule (500 mg) by mouth    rosuvastatin (CRESTOR) 5 MG tablet Take 1 tablet (5 mg) by mouth every morning (Patient taking differently: Take 1  tablet (5 mg) by mouth nightly)    triamterene-hydrochlorothiazide (DYAZIDE) 37.5-25 MG per capsule TAKE ONE CAPSULE BY MOUTH EVERY MORNING       Review of Systems:    History obtained from chart review and the patient  Constitutional: negative for fevers, night sweats  Respiratory: negative for SOB, cough  Cardiovascular: negative for chest pain, palpitations  Gastrointestinal: negative for abdominal pain, nausea, vomiting      Physical Exam:   There were no vitals filed for this visit.  General Appearance:    Alert, cooperative, no distress, obese   Head:    Normocephalic, without obvious abnormality, atraumatic   Eyes:     EOM's intact, no scleral icterus,                   Lungs:     Clear to auscultation bilaterally, respirations unlabored       Heart:    Regular rate and rhythm   Abdomen:     Soft, non-tender, bowel sounds active all four quadrants,     no masses, no organomegaly                   Neurologic:   Normal strength       throughout         Labs:     Results       ** No results found for the last 24 hours. **            Radiology Results (24 Hour)       ** No results found for the last 24 hours. **                       Assessment:   Morbid obesity    Plan:     EGD with possible biopsy/dilation. The risks including:  bleeding, infection, perforation, flatulence, incomplete procedure requiring further evaluation were all discussed.  The patient agrees to an EGD with possible biopsy/dilation.      Preop EGD for sleeve gastrectomy with Dr. Rosaland Lao.       Signed by: Lorinda Creed, DO                   [1] No Known Allergies

## 2023-02-06 NOTE — Anesthesia Postprocedure Evaluation (Signed)
Anesthesia Post Evaluation    Patient: Jennifer [name]    Procedure(s):  ESOPHAGOGASTRODUODENOSCOPY (EGD), BIOPSY    Anesthesia type: general    Last Vitals:   Vitals Value Taken Time   BP 138/70 02/06/23 1010   Temp 37 C (98.6 F) 02/06/23 0944   Pulse 60 02/06/23 1010   Resp 18 02/06/23 1010   SpO2 94 % 02/06/23 1010                 Anesthesia Post Evaluation:     Patient Evaluated: PACU  Patient Participation: complete - patient participated  Level of Consciousness: awake and alert  Pain Score: 1  Pain Management: satisfactory to patient  Non-opioid multimodal analgesia not used between 6 hours prior to anesthesia start and PACU discharge    Airway Patency: patent  Two or more mitigation strategies not used for obstructive sleep apnea.      Anesthetic complications: No      PONV Status: none    Cardiovascular status: acceptable  Respiratory status: acceptable  Hydration status: acceptable    Quality/Regulatory Reporting Exceptions  Medical reason(s) for not using multimodal pain management: no pain reported        Signed by: Bettye Boeck, MD, 02/06/2023 12:08 PM

## 2023-02-06 NOTE — Transfer of Care (Signed)
Anesthesia Transfer of Care Note    Patient: Jennifer [name]    Procedures performed: Procedure(s):  ESOPHAGOGASTRODUODENOSCOPY (EGD), BIOPSY    Anesthesia type: General TIVA    Patient location:Phase II PACU    Last vitals:   Vitals:    02/06/23 0826   BP: 126/61   Pulse: (!) 56   Resp: 20   Temp: 36.9 C (98.4 F)   SpO2: 96%       Post pain: Patient not complaining of pain, continue current therapy      Mental Status:awake    Respiratory Function: tolerating room air    Cardiovascular: stable    Nausea/Vomiting: patient not complaining of nausea or vomiting    Hydration Status: adequate    Post assessment: no apparent anesthetic complications    Signed by: Colin Mulders, CRNA  02/06/23 9:38 AM

## 2023-02-06 NOTE — Discharge Instr - AVS First Page (Addendum)
EGD Discharge Instructions  General Instructions:  1. Following sedation, your judgement, perception, and coordination are considered impaired. Even though you may feel awake and alert, you are considered legally intoxicated. Therefore, until the next morning;  Do not Drive  Do not operate appliances or equipment that requires reaction time (e.g. Stove, electrical tools, machinery)  Do not sign legal documents or be involved in important decisions.  Do not smoke if alone  Do not drink alcoholic beverages  Go directly home and rest for several hours before resuming your routine activities.  It is highly recommended to have a responsible adult stay with you for the next 24 hours    2. Tenderness, swelling or pain may occur at the IV site where you received sedation. If you experience this, apply warm soaks to the area. Notify your physician if this persists.      Instructions Specific To Procedures - Report To Physician Any Of The Following:    Upper Endoscopy, ERCP, Dilations   1. Pain in Chest   2. Nausea/vomitting   3. Fevers/Chills within 24 hours after procedure. Temp>101deg F   4. Severe and persistent abdominal pain and bloating     In Addition:   Mild throat soreness may follow this procedure. Warm salt water gargling or    lozenges of your choice will most likely relieve your discomfort or cold drinks and   popsicles.        Additional Discharge Instructions  Your diet after the procedure: Nothing red to eat or drink for 24 hours. The first meal should be light. Nothing greasy, spicy, or fried. If that meal is tolerated you may resume your normal diet.          If you have questions or problems contact your MD immediately. If you need immediate attention, call your MD, 911 and/or go to nearest emergency room.

## 2023-02-07 ENCOUNTER — Encounter: Payer: Self-pay | Admitting: Surgery

## 2023-02-07 ENCOUNTER — Other Ambulatory Visit (INDEPENDENT_AMBULATORY_CARE_PROVIDER_SITE_OTHER): Payer: Self-pay | Admitting: Family Medicine

## 2023-02-07 DIAGNOSIS — E039 Hypothyroidism, unspecified: Secondary | ICD-10-CM

## 2023-02-07 MED ORDER — LEVOTHYROXINE SODIUM 100 MCG PO TABS
100.0000 ug | ORAL_TABLET | ORAL | 0 refills | Status: DC
Start: 2023-02-10 — End: 2023-02-23

## 2023-02-07 NOTE — Telephone Encounter (Signed)
Last filled March 2024.   Last o/v August 2024.  Patient has an upcoming appointment on April 17 2023.  Queued up  15  with 0 refills.    Labs drawn 01/15/23 needs to be reviewed

## 2023-02-12 LAB — SURGICAL PATHOLOGY

## 2023-02-13 ENCOUNTER — Encounter (HOSPITAL_BASED_OUTPATIENT_CLINIC_OR_DEPARTMENT_OTHER): Payer: Self-pay | Admitting: Surgery

## 2023-02-19 ENCOUNTER — Encounter (INDEPENDENT_AMBULATORY_CARE_PROVIDER_SITE_OTHER): Payer: Self-pay

## 2023-02-20 ENCOUNTER — Encounter (INDEPENDENT_AMBULATORY_CARE_PROVIDER_SITE_OTHER): Payer: Self-pay

## 2023-02-23 ENCOUNTER — Other Ambulatory Visit (INDEPENDENT_AMBULATORY_CARE_PROVIDER_SITE_OTHER): Payer: Self-pay | Admitting: Family

## 2023-02-23 DIAGNOSIS — E039 Hypothyroidism, unspecified: Secondary | ICD-10-CM

## 2023-02-25 MED ORDER — LEVOTHYROXINE SODIUM 100 MCG PO TABS
100.0000 ug | ORAL_TABLET | ORAL | 0 refills | Status: DC
Start: 2023-03-03 — End: 2024-03-12

## 2023-02-25 NOTE — Telephone Encounter (Signed)
Last filled September 2024.   Last o/v September 2024.  Patient has an upcoming appointment on April 17 2023.  Queued up 90 with 0 refills.

## 2023-03-01 ENCOUNTER — Telehealth (INDEPENDENT_AMBULATORY_CARE_PROVIDER_SITE_OTHER): Payer: Self-pay

## 2023-03-01 DIAGNOSIS — Z6841 Body Mass Index (BMI) 40.0 and over, adult: Secondary | ICD-10-CM

## 2023-03-01 DIAGNOSIS — Z713 Dietary counseling and surveillance: Secondary | ICD-10-CM

## 2023-03-01 DIAGNOSIS — Z719 Counseling, unspecified: Secondary | ICD-10-CM

## 2023-03-01 NOTE — Progress Notes (Signed)
Class conducted via [employer] app.     Fear of Failure    S & O: Patient reports she has lost 0 pounds since the previous class. Pt actively participated in group activity. Pt continues to be interested in [facility] to improve co-morbid conditions.    Previous Wt:   Wt Readings from Last 10 Encounters:   02/06/23 245 lb   02/01/23 245 lb   01/15/23 245 lb   01/02/23 246 lb   12/26/22 248 lb   12/17/22 245 lb 6.4 oz   10/15/22 241 lb   07/12/22 238 lb   09/25/21 259 lb 12.8 oz   10/17/20 225 lb       Nutrition Assessment and Diagnosis: Pt with the condition of morbid obesity and co-morbidities with ongoing  nutrition knowledge deficit as evidenced by initial report,  active participation in class, There is no height or weight on file to calculate BMI.    Patient continues to demonstrate motivation to modify behaviors, implement lifestyle changes to support continued weight loss. Patient has mild nutrition knowledge deficit as evidenced per initial report and continued elevated BMI.     Discussion focused on:     1. Strategies to support self-efficacy  2. Behavior changes that support best outcomes after WLS  3. Attending regular follow ups with Bariatrics team  4. Where to seek support before and after [facility]    Materials Provided:  Copy of powerpoint slides and accompanying handouts    P.   Return in 1 month for additional class with completed homework.  2.  Pt will con't goal setting per pt report/active previous class participation  and will con't to make small but measureable improvement in meal pattern/ choices/ and exercise habits.      Educated pt for 30 minutes in a group setting.  Plan reviewed with surgeon.

## 2023-03-04 ENCOUNTER — Ambulatory Visit (INDEPENDENT_AMBULATORY_CARE_PROVIDER_SITE_OTHER): Payer: Medicare Other | Admitting: Family

## 2023-03-04 ENCOUNTER — Ambulatory Visit: Payer: Medicare Other

## 2023-03-04 ENCOUNTER — Encounter (INDEPENDENT_AMBULATORY_CARE_PROVIDER_SITE_OTHER): Payer: Self-pay

## 2023-03-04 ENCOUNTER — Ambulatory Visit
Admission: RE | Admit: 2023-03-04 | Discharge: 2023-03-04 | Disposition: A | Discharge status: Home / Self Care | Payer: Medicare Other | Source: Ambulatory Visit | Attending: Family | Admitting: Family

## 2023-03-04 ENCOUNTER — Encounter (INDEPENDENT_AMBULATORY_CARE_PROVIDER_SITE_OTHER): Payer: Self-pay | Admitting: Family

## 2023-03-04 VITALS — BP 116/76 | HR 67 | Ht 62.0 in | Wt 245.0 lb

## 2023-03-04 DIAGNOSIS — E46 Unspecified protein-calorie malnutrition: Secondary | ICD-10-CM

## 2023-03-04 DIAGNOSIS — Z7189 Other specified counseling: Secondary | ICD-10-CM | POA: Insufficient documentation

## 2023-03-04 DIAGNOSIS — Z1321 Encounter for screening for nutritional disorder: Secondary | ICD-10-CM

## 2023-03-04 DIAGNOSIS — Z6841 Body Mass Index (BMI) 40.0 and over, adult: Secondary | ICD-10-CM

## 2023-03-04 DIAGNOSIS — E43 Unspecified severe protein-calorie malnutrition: Secondary | ICD-10-CM | POA: Insufficient documentation

## 2023-03-04 DIAGNOSIS — C3492 Malignant neoplasm of unspecified part of left bronchus or lung: Secondary | ICD-10-CM | POA: Insufficient documentation

## 2023-03-04 DIAGNOSIS — E7489 Other specified disorders of carbohydrate metabolism: Secondary | ICD-10-CM

## 2023-03-04 LAB — THYROID STIMULATING HORMONE (TSH) WITH REFLEX TO FREE T4: TSH: 4.07 u[IU]/mL (ref 0.35–4.94)

## 2023-03-04 LAB — VITAMIN B12: Vitamin B-12: 429 pg/mL (ref 211–911)

## 2023-03-04 LAB — COMPREHENSIVE METABOLIC PANEL
ALT: 13 U/L (ref 0–55)
AST (SGOT): 17 U/L (ref 5–41)
Albumin/Globulin Ratio: 1.3 (ref 0.9–2.2)
Albumin: 3.9 g/dL (ref 3.5–5.0)
Alkaline Phosphatase: 46 U/L (ref 37–117)
Anion Gap: 12 (ref 5.0–15.0)
BUN: 21 mg/dL (ref 7–21)
Bilirubin, Total: 0.4 mg/dL (ref 0.2–1.2)
CO2: 26 meq/L (ref 17–29)
Calcium: 10.1 mg/dL (ref 8.5–10.5)
Chloride: 104 meq/L (ref 99–111)
Creatinine: 1.1 mg/dL — ABNORMAL HIGH (ref 0.4–1.0)
GFR: 54.1 mL/min/{1.73_m2} — ABNORMAL LOW (ref >=60.0)
Globulin: 3 g/dL (ref 2.0–3.6)
Glucose: 152 mg/dL — ABNORMAL HIGH (ref 70–100)
Hemolysis Index: 6 {index}
Potassium: 4.4 meq/L (ref 3.5–5.3)
Protein, Total: 6.9 g/dL (ref 6.0–8.3)
Sodium: 142 meq/L (ref 135–145)

## 2023-03-04 LAB — PT/INR
INR: 1 (ref 0.9–1.1)
PT: 11.4 s (ref 10.1–12.9)

## 2023-03-04 LAB — IRON PROFILE
Iron Saturation: 33 % (ref 15–50)
Iron: 104 ug/dL (ref 32–157)
TIBC: 317 ug/dL (ref 265–497)
UIBC: 213 ug/dL (ref 126–382)

## 2023-03-04 LAB — CBC
Absolute nRBC: 0 10*3/uL (ref <=0.00)
Absolute nRBC: 0 10*3/uL (ref <=0.00)
Hematocrit: 43.8 % — ABNORMAL HIGH (ref 34.7–43.7)
Hematocrit: 42.4 % (ref 34.7–43.7)
Hemoglobin: 14 g/dL (ref 11.4–14.8)
Hemoglobin: 13.8 g/dL (ref 11.4–14.8)
MCH: 30.7 pg (ref 25.1–33.5)
MCH: 31 pg (ref 25.1–33.5)
MCHC: 32 g/dL (ref 31.5–35.8)
MCHC: 32.5 g/dL (ref 31.5–35.8)
MCV: 96.1 fL — ABNORMAL HIGH (ref 78.0–96.0)
MCV: 95.3 fL (ref 78.0–96.0)
MPV: 12 fL (ref 8.9–12.5)
MPV: 12 fL (ref 8.9–12.5)
Platelet Count: 342 10*3/uL (ref 142–346)
Platelet Count: 353 10*3/uL — ABNORMAL HIGH (ref 142–346)
RBC: 4.56 10*6/uL (ref 3.90–5.10)
RBC: 4.45 10*6/uL (ref 3.90–5.10)
RDW: 13 % (ref 11–15)
RDW: 13 % (ref 11–15)
WBC: 8.38 10*3/uL (ref 3.10–9.50)
WBC: 8.42 10*3/uL (ref 3.10–9.50)
nRBC %: 0 /100{WBCs} (ref <=0.0)
nRBC %: 0 /100{WBCs} (ref <=0.0)

## 2023-03-04 LAB — HEMOGLOBIN A1C
Average Estimated Glucose: 122.6 mg/dL
Hemoglobin A1C: 5.9 % — ABNORMAL HIGH (ref 4.6–5.6)

## 2023-03-04 LAB — FOLATE
Folate: 9.5 ng/mL
Hemolysis Index: 2 {index}

## 2023-03-04 LAB — LIPID PANEL
Cholesterol / HDL Ratio: 4.4 {index}
Cholesterol: 160 mg/dL (ref <=199)
HDL: 36 mg/dL — ABNORMAL LOW (ref >=40)
LDL Calculated: 68 mg/dL (ref 0–99)
Triglycerides: 281 mg/dL — ABNORMAL HIGH (ref 34–149)
VLDL Calculated: 56 mg/dL — ABNORMAL HIGH (ref 10–40)

## 2023-03-04 LAB — VITAMIN D, 25 OH, TOTAL: Vitamin D 25-OH, Total: 74 ng/mL (ref 30–100)

## 2023-03-04 NOTE — Progress Notes (Signed)
Second Visit    Patient Name: Jennifer [name], Jennifer [name]  Age: 67 y.o.  Sex: female   DOB: [birth year]  MRN: [id number]    Progress Note:    NATASHIA DEININGER presents today for a second visit.    She is planning to undergo Robotic Assisted Laparoscopic Sleeve Gastrectomy with Dr. Rosaland Lao    Pre-op testing/requirements:  - All pre-operative testing was explained as well as the education requirement at [facility]).     - The patient was informed of the need to obtain ALL of the following PRIOR to meeting with surgeon pre-operatively:    - EGD: done 02/06/23  - CXR: needs  - EKG: done 01/25/23   - Labs: needs     - Smoking status: never    Clearances:  - Medical clearance: done 01/15/23   - Psychological clearance: done 01/02/23  - Evaluation for cardiac clearance: done 01/25/23  - Nutrition clearance: done 12/31/22   -Additional clearance: none    Pre-Procedure Medication:   - Patient was offered a prescription for aprepitant (Emend) antiemetic and advised to use [facility] to find the cheapest price on the medication since it is an out-of-pocket expense. Patient instructed to take first dose 3 hours prior the surgery with a small sip of water. She was given two additional refills which can be used after surgery, if needed.    Special Instructions:   All pre-operative requirements and clearances were explained:  Medications, medical allergies, and health history were reviewed. All instructions were provided verbally or in writing.  She was instructed to avoid all anti-inflammatory medications for two weeks prior to surgery and indefinitiely thereafter.    DOS TBD   She was instructed not to take any vitamins for one week prior to surgery.  She was instructed to avoid all immunosuppressive medications and estrogen-related hormones (excluding IUD) for 1 month pre/post operatively.  She was instructed to bring the CPAP machine to the hospital the day of surgery, if applicable.   No smoking at LEAST 8 weeks prior to  surgery and remain smoke-free indefinitely thereafter. This applies to cigarettes, vape, marijuana, e-cigarette etc. Discussed risks a/w smoking both pre- and post- operatively. Will plan for nicotine testing prior to surgery if applicable. Pt verbalized understanding and agrees with the plan.  Na For women of childbearing age: She was advised to use a reliable birth control method and AVOID PREGNANCY for 18 months after surgery.     Program completion attestation: Patient agrees and attests that he/she has attempted weight loss in the past without successful long-term weight reduction and has participated in the intensive multicomponent behavioral interventions designed to help achieve or maintain weight loss through a combination of dietary changes and increased physical activity. The intensive multicomponent behavioral intervention program had components focusing on nutrition, physical activity and behavioral modification and was supervised by behavioral therapists, psychologists, registered dietitians, exercise physiologists and other staff.    Pre-op nutrition: Pt aware of pre-op diet. Received sample pack of liquid diet. Pt will be on liquid diet for 2 weeks prior to surgery. Once pt is approved for surgery and has been scheduled, pt will purchase the pre-op diet from [facility] unless otherwise discussed with the Registered Dietitian. Pt verbalized understanding and desired compliance. All questions addressed to satisfaction.     Medications, medical allergies, and health history were reviewed.   All questions were answered to satisfaction    Past Medical History:     Past Medical  History:   Diagnosis Date    Abnormal vision     glasses    Arrhythmia 2018    SVT (had ablation)    Arthritis     Complication of anesthesia     urinary retention with every joint replacement, BP drop with spinal X1, another time spinal didn't work    Convulsions     epilepsy, last seizure 2010    Deep venous thrombosis of  distal lower extremity 2018    after cholecystectomy; left leg    Diverticulitis     takes miralax    Encounter for blood transfusion 2018    Gastric ulceration 2018    GI bleed with requiring 4 units PRBCs per pt    Hyperlipidemia     Hypothyroidism     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 Authorizing Provider   aspirin 325 MG tablet Take 1 tablet (325 mg) by mouth daily Restart on 02/07/23 02/06/23   Lorinda Creed, DO   Cholecalciferol (Vitamin D3) 2000 UNIT capsule Take 1 capsule (2,000 Units) by mouth daily    [provider]   clonazePAM (KlonoPIN) 1 MG tablet Take 1 tablet (1 mg) by mouth 3 (three) times daily as needed    [provider]   Keppra XR 750 MG extended release 24 hr tablet Take 2 tablets (1,500 mg) by mouth 2 (two) times daily 10/29/22   [provider]   Lacosamide (Vimpat) 200 MG Tab Take by mouth 2 (two) times daily    [provider]   levothyroxine (SYNTHROID) 100 MCG tablet Take 1 tablet (100 mcg) by mouth Once each week on Sunday morning 03/03/23   Lavonia Drafts, FNP   metoprolol succinate XL (TOPROL-XL) 25 MG 24 hr tablet Take 1 tablet (25 mg) by mouth daily 02/03/23   Excell Seltzer, MD   Omega-3 Fatty Acids (Fish Oil) 500 MG Cap Take 1 capsule (500 mg) by mouth    [provider]   rosuvastatin (CRESTOR) 5 MG tablet Take 1 tablet (5 mg) by mouth every morning  Patient taking differently: Take 1 tablet (5 mg) by mouth nightly 01/31/23   Bui, Erasmo Leventhal, MD   triamterene-hydrochlorothiazide (DYAZIDE) 37.5-25 MG per capsule TAKE ONE CAPSULE BY MOUTH EVERY MORNING 01/21/23   Excell Seltzer, MD       Review of Systems:     Constitutional: negative for fevers, night sweats  Head and neck: denies blurry vision, dry mouth or  hearing impairment  Respiratory: negative for SOB, cough  Cardiovascular: negative for chest pain, palpitations  Gastrointestinal: negative for abdominal pain or bloating  Genitourinary: negative for hematuria, dysuria  Musculoskeletal: negative for bone pain, myalgias and stiff joints  Neurological: negative for dizziness, gait problems, headaches and memory problems  Behavioral/Psych: negative for fatigue, loss of interest in favorite activities, sleep disturbance  Endocrine: negative for temperature intolerance    Physical Exam:     Vitals:    03/04/23 1100   BP: 116/76   Pulse: 67     Wt Readings from Last 10 Encounters:   03/04/23 245 lb   02/06/23 245 lb   02/01/23  245 lb   01/15/23 245 lb   01/02/23 246 lb   12/26/22 248 lb   12/17/22 245 lb 6.4 oz   10/15/22 241 lb   07/12/22 238 lb   09/25/21 259 lb 12.8 oz       Appearance: Comfortable, no acute distress, well nourished  HEENT: Normocephalic, clear conjunctiva   Chest: Breathing unlabored  Neuro: A&Ox3  Psych: Calm, cooperative      Assessment:   BELISA KASSAM is a 67 y.o. female with morbid obesity who presents for pre-operative visit for Robotic Assisted Laparoscopic Sleeve Gastrectomy.  All of her questions were answered.     Plan:   - Complete all pre-operative requirements prior to upcoming visit with surgeon.    - Liquid diet prior to surgery.   - Return to the office for nutritional counseling and education as well as for a final pre-operative visit with Dr. Rosaland Lao .       Signed by: Lestine Mount, FNP,                  [1]   Past Surgical History:  Procedure Laterality Date    ADENOIDECTOMY      APPENDECTOMY (OPEN)  2009    ARTHROPLASTY, KNEE, TOTAL Left 02/12/2020    Procedure: LEFT ARTHROPLASTY, KNEE, TOTAL;  Surgeon: Harvie Bridge, MD;  Location: [facility];  Service: Orthopedics;  Laterality: Left;    BACK SURGERY  1973    lumbar    CARDIAC ABLATION  2018    SVT    CHOLECYSTECTOMY  2018    COLON SURGERY  2008    resection,  perforated colon with colonoscopy    COLONOSCOPY, DIAGNOSTIC (SCREENING)      EGD, BIOPSY N/A 02/06/2023    Procedure: ESOPHAGOGASTRODUODENOSCOPY (EGD), BIOPSY;  Surgeon: Tommye Standard, MD;  [facility];  Service: General;  Laterality: N/A;    JOINT REPLACEMENT      bilat hips, L 2005, R 2010, right knee 2015    OTHER SURGICAL HISTORY  2018    IVC filter    SMALL INTESTINE SURGERY  2007    Colon resection post colonoscopy    SPINE SURGERY  11/26/1971    TONSILLECTOMY     [2]   Family History  Problem Relation Name Age of Onset    Cancer Mother Magdalen Spatz         lung    Arthritis Mother Magdalen Spatz     Heart disease Mother Magdalen Spatz         A fib/ chf    Stroke Mother Magdalen Spatz     Heart attack Father Vilinda Boehringer 12    Cancer Son Phoebe Sharps         Thyroid    Cancer Maternal Uncle Eusebio Friendly    [3]   Allergies  Allergen Reactions    Gabapentin      weezing

## 2023-03-05 ENCOUNTER — Telehealth (INDEPENDENT_AMBULATORY_CARE_PROVIDER_SITE_OTHER): Payer: Self-pay

## 2023-03-05 ENCOUNTER — Encounter (INDEPENDENT_AMBULATORY_CARE_PROVIDER_SITE_OTHER): Payer: Self-pay

## 2023-03-05 ENCOUNTER — Telehealth (HOSPITAL_BASED_OUTPATIENT_CLINIC_OR_DEPARTMENT_OTHER): Payer: Self-pay

## 2023-03-05 ENCOUNTER — Ambulatory Visit (INDEPENDENT_AMBULATORY_CARE_PROVIDER_SITE_OTHER): Payer: Medicare Other | Admitting: Family

## 2023-03-05 NOTE — Telephone Encounter (Signed)
Spoke to patient scheduled pre-op, post op and surgery for 11/29 Dr. Rosaland Lao  Discussed remaining test  Medical clearance  2 wk diet

## 2023-03-05 NOTE — Telephone Encounter (Signed)
Patient has medicare Part A & B and does not require authorization.  Sent to Sarah/Diana to schedule with [place].  Cpt [id number] sleeve

## 2023-03-07 LAB — VITAMIN A: Vitamin A: 54 ug/dL (ref 38–98)

## 2023-03-09 LAB — WHOLE BLOOD VITAMIN B1 (THIAMINE): Vitamin B1, Whole Blood: 144 nmol/L (ref 78–185)

## 2023-03-12 ENCOUNTER — Encounter (INDEPENDENT_AMBULATORY_CARE_PROVIDER_SITE_OTHER): Payer: Self-pay

## 2023-03-20 ENCOUNTER — Telehealth (INDEPENDENT_AMBULATORY_CARE_PROVIDER_SITE_OTHER): Payer: Medicare Other | Admitting: Registered"

## 2023-03-20 DIAGNOSIS — Z719 Counseling, unspecified: Secondary | ICD-10-CM

## 2023-03-20 DIAGNOSIS — Z6841 Body Mass Index (BMI) 40.0 and over, adult: Secondary | ICD-10-CM

## 2023-03-24 NOTE — Group Note (Signed)
This class was conducted via [employer]    S:  Pt presents with questions about post op diet phases, supplements and lifestyle after weight loss surgery.  Pt presents for 2 hour pre operative education class.    A:  Pt has nutrition knowledge deficit regarding pre op and post op nutrition guidelines for weight loss surgery.  Pt has been educated on the following topics:  Anatomy review.  Necessary behavior/eating modifications to avoid complications.  Post op liquid diet phase  Post op mushy/soft foods diet phase  Vitamin/mineral supplementation and consequences of non compliance.  Reviewed symptoms of deficiencies.  Protein supplementation - requirements of protein supplements, amounts needed per surgery/pt, and consequences of non compliance.  Review of the nutrition fact panel, what to look for.  Review of dumping syndrome and it's effects in addition to trigger foods.  Review of possible post operative complications, tips/techniques to manage them.  Review of appropriate foods and meal plans for each diet phase.  Quick review of physical activity and the guidelines pt needs to follow post operatively.    P:  1.  Pt to follow up with MD and RD for final pre op visit.    2.  Contact information provided.  Pt to contact PRN.    Spent a total of 90 minutes educating pt in a group setting.  Plan reviewed with surgeon.

## 2023-04-01 ENCOUNTER — Telehealth (HOSPITAL_BASED_OUTPATIENT_CLINIC_OR_DEPARTMENT_OTHER): Payer: Medicare Other | Admitting: Licensed Clinical Social Worker

## 2023-04-01 DIAGNOSIS — Z6841 Body Mass Index (BMI) 40.0 and over, adult: Secondary | ICD-10-CM

## 2023-04-01 DIAGNOSIS — E66813 Obesity, class 3: Secondary | ICD-10-CM

## 2023-04-01 DIAGNOSIS — Z719 Counseling, unspecified: Secondary | ICD-10-CM

## 2023-04-01 DIAGNOSIS — F439 Reaction to severe stress, unspecified: Secondary | ICD-10-CM

## 2023-04-01 NOTE — Progress Notes (Signed)
 Outpatient Services Bariatric Behavioral Health Follow Up Note    04/01/2023    Start: 11:30 AM End:  12:16 PM    Modality: video    Interpreter present? no    Telemedicine:   Verbal consent has been obtained from the patient to conduct a video visit. *I

## 2023-04-01 NOTE — Patient Instructions (Signed)
 Hello!     Thank you for meeting with me! I look forward to your continued success in the program overall! Please feel free to reach out for support if you need it and you can continue to see me for a visit even before your follow up appointments pre-op or

## 2023-04-05 ENCOUNTER — Encounter (INDEPENDENT_AMBULATORY_CARE_PROVIDER_SITE_OTHER): Payer: Self-pay

## 2023-04-05 ENCOUNTER — Ambulatory Visit (INDEPENDENT_AMBULATORY_CARE_PROVIDER_SITE_OTHER): Payer: Medicare Other

## 2023-04-05 NOTE — PSS Phone Screening (Signed)
 Surgeon Jarrett Ables Eliezer [facility] Guideline Requirements:   Routine Bariatric testing  A1C (age >34 with BMI> 32) per Anesthesia     Documentation already in chart:  []  Medical preop consult    Epic Encounter 8/20-will confirm if updating needed per pt

## 2023-04-10 ENCOUNTER — Encounter (INDEPENDENT_AMBULATORY_CARE_PROVIDER_SITE_OTHER): Payer: Self-pay

## 2023-04-10 ENCOUNTER — Ambulatory Visit (INDEPENDENT_AMBULATORY_CARE_PROVIDER_SITE_OTHER): Payer: Medicare Other | Admitting: Registered"

## 2023-04-10 ENCOUNTER — Telehealth (INDEPENDENT_AMBULATORY_CARE_PROVIDER_SITE_OTHER): Payer: Self-pay | Admitting: Internal Medicine

## 2023-04-10 ENCOUNTER — Encounter (INDEPENDENT_AMBULATORY_CARE_PROVIDER_SITE_OTHER): Payer: Self-pay | Admitting: Surgery

## 2023-04-10 ENCOUNTER — Ambulatory Visit (INDEPENDENT_AMBULATORY_CARE_PROVIDER_SITE_OTHER): Payer: Medicare Other | Admitting: Surgery

## 2023-04-10 VITALS — BP 110/75 | HR 73 | Ht 62.0 in | Wt 250.0 lb

## 2023-04-10 DIAGNOSIS — Z6841 Body Mass Index (BMI) 40.0 and over, adult: Secondary | ICD-10-CM

## 2023-04-10 DIAGNOSIS — R7303 Prediabetes: Secondary | ICD-10-CM

## 2023-04-10 DIAGNOSIS — E781 Pure hyperglyceridemia: Secondary | ICD-10-CM

## 2023-04-10 DIAGNOSIS — I471 Supraventricular tachycardia, unspecified: Secondary | ICD-10-CM

## 2023-04-10 DIAGNOSIS — M1712 Unilateral primary osteoarthritis, left knee: Secondary | ICD-10-CM

## 2023-04-10 DIAGNOSIS — Z9049 Acquired absence of other specified parts of digestive tract: Secondary | ICD-10-CM

## 2023-04-10 DIAGNOSIS — G40909 Epilepsy, unspecified, not intractable, without status epilepticus: Secondary | ICD-10-CM

## 2023-04-10 DIAGNOSIS — M51372 Other intervertebral disc degeneration, lumbosacral region with discogenic back pain and lower extremity pain: Secondary | ICD-10-CM

## 2023-04-10 DIAGNOSIS — Z713 Dietary counseling and surveillance: Secondary | ICD-10-CM

## 2023-04-10 NOTE — Progress Notes (Signed)
 Drs. Fermin Schwab, Luana Shu, and [facility]  [address]  [address]  [place], [place] [address]  [phone]   [phone]    [address], [address]   [place], [place] [address]   [phone]   [phone]    The patient is a pleasa

## 2023-04-10 NOTE — Patient Instructions (Signed)
 Below are the instructions for the required vitamin/mineral supplementation for those patients having the gastric bypass or gastric sleeve.  In addition, there is a review of the 3 week post-operative liquid diet you will need to follow after your surgical

## 2023-04-10 NOTE — Telephone Encounter (Signed)
Why was she prescribed lovenox in the first place?  If her orthopedist started her on it, why are they not prescribing refill for her?

## 2023-04-10 NOTE — Progress Notes (Signed)
 S:  Pt is preparing for bariatric SG surgery with Dr Rosaland Lao on 04/26/23.  Husband also present for visit. Pt presents for pre-operative review appointment to discuss post-operative diet stages and vitamin/mineral requirements.      Pt states hx of the f

## 2023-04-10 NOTE — Telephone Encounter (Signed)
 Per patient, her orthopedic doctor put her on a 2 week supply of blood thinner.  She was then advised to call her cardiologist for samples and was given a 2 week supply.  Patient is inquiring if pcp can prescribe Lovenox.  Please call and advise on next st

## 2023-04-11 NOTE — Telephone Encounter (Signed)
Pt seen for OV and aware of when to start pre op diet.

## 2023-04-11 NOTE — Telephone Encounter (Signed)
 Spoke with patient and she stated that he is having bariatric surgery on 04/26/23 and was advised that she will need to get lovenox rx. Patient was calling pcp office to see if office had samples or coupons for rx due to copay, but was not requesting pcp t

## 2023-04-12 ENCOUNTER — Encounter (INDEPENDENT_AMBULATORY_CARE_PROVIDER_SITE_OTHER): Payer: Self-pay

## 2023-04-14 ENCOUNTER — Other Ambulatory Visit (INDEPENDENT_AMBULATORY_CARE_PROVIDER_SITE_OTHER): Payer: Self-pay | Admitting: Internal Medicine

## 2023-04-16 ENCOUNTER — Telehealth (INDEPENDENT_AMBULATORY_CARE_PROVIDER_SITE_OTHER): Payer: Self-pay

## 2023-04-16 NOTE — Telephone Encounter (Signed)
 Rcv'd VM from pt regarding nausea after starting preop liquid diet. Diet started Fri 11/15 and "severe" nausea started on Sun 11/17. Nausea lasts 2 hrs at a time. She pushed fluids thinking this could have been related to dehydration and ate a banana think

## 2023-04-17 ENCOUNTER — Ambulatory Visit (INDEPENDENT_AMBULATORY_CARE_PROVIDER_SITE_OTHER): Payer: Medicare Other | Admitting: Internal Medicine

## 2023-04-17 NOTE — Telephone Encounter (Signed)
 Returned pt call.  Pt reports she will get nauseated for two hours at a time, but no vomiting. Will go away on it's own, but happens daily.    Taking her BA and unjury products 5-6 items per day, making sure to get her banana or 8oz milk, and staying hydra

## 2023-04-19 ENCOUNTER — Encounter (INDEPENDENT_AMBULATORY_CARE_PROVIDER_SITE_OTHER): Payer: Self-pay | Admitting: Registered"

## 2023-04-20 ENCOUNTER — Encounter (INDEPENDENT_AMBULATORY_CARE_PROVIDER_SITE_OTHER): Payer: Self-pay

## 2023-04-22 NOTE — Progress Notes (Signed)
Chart review:  LON Cardio Dr. Aneta Mins 01/25/23 in media in EPIC.  LON Neuro Dr. Melvenia Beam 12/14/22 in media in EPIC, includes statement of optimization [place] [facility].   Echo 02/15/23 in media.  Chart sent for anesthesia review.

## 2023-04-23 ENCOUNTER — Encounter (INDEPENDENT_AMBULATORY_CARE_PROVIDER_SITE_OTHER): Payer: Self-pay

## 2023-04-24 ENCOUNTER — Encounter: Payer: Self-pay | Admitting: Surgery

## 2023-04-24 ENCOUNTER — Telehealth (HOSPITAL_BASED_OUTPATIENT_CLINIC_OR_DEPARTMENT_OTHER): Payer: Self-pay

## 2023-04-24 ENCOUNTER — Encounter (INDEPENDENT_AMBULATORY_CARE_PROVIDER_SITE_OTHER): Payer: Self-pay

## 2023-04-24 NOTE — Telephone Encounter (Signed)
LM for patient with new surgery time

## 2023-04-24 NOTE — Progress Notes (Signed)
Sent Post Op Exercise Guidelines via MyChart & Email.

## 2023-04-24 NOTE — Anesthesia Preprocedure Evaluation (Signed)
Relevant Problems   Diagnosis    Primary osteoarthritis of left knee    Mass of right breast, unspecified quadrant    Ventricular tachyarrhythmia    Hypertriglyceridemia    Hypothyroidism, unspecified type    DDD (degenerative disc disease), lumbosacral    Prediabetes    Class 3 severe obesity with serious comorbidity and body mass index (BMI) of 40.0 to 44.9 in adult, unspecified obesity type    Seizure disorder    SVT (supraventricular tachycardia)    History of colon resection    History of cholecystectomy    History of maternal deep vein thrombosis (DVT)    Morbid obesity with BMI of 45.0-49.9, adult       PSS Anesthesia Comments: BMI-45.73 with history of hypothyroidism, seizures-none since 2009, DVT (2018), diverticulitis, SVT s/p ablation (2019), GI bleed, prediabetes, urinary retention after joint replacement: Labs glucose-152, BUN/CR-21/1.1, A1c-5.9: EKG-SB, rate 56: Echo (02/15/2023)-EF 60% with normal LV systolic function, concentric LVH, grade 1 LV DD, mild to moderate AI and mild TR: CXR-no acute disease.IVC filter at L2 level: Cardiology clearance (01/25/2023)- Dr. Aneta Mins: Neurology clearance( 12/14/2022)- Mady Haagensen, NP        Anesthesia Plan                                               Signed by: Landry Dyke, NP 04/24/23 3:40 PM

## 2023-04-26 ENCOUNTER — Encounter
Admission: RE | Disposition: A | Discharge status: Home / Self Care | Payer: Self-pay | Source: Home / Self Care | Attending: Surgery

## 2023-04-26 ENCOUNTER — Encounter: Payer: Self-pay | Admitting: Surgery

## 2023-04-26 ENCOUNTER — Inpatient Hospital Stay: Payer: Medicare Other | Admitting: Acute Care

## 2023-04-26 ENCOUNTER — Inpatient Hospital Stay
Admission: RE | Admit: 2023-04-26 | Discharge: 2023-04-27 | DRG: 620 | Disposition: A | Discharge status: Home / Self Care | Payer: Medicare Other | Attending: Surgery | Admitting: Surgery

## 2023-04-26 DIAGNOSIS — E781 Pure hyperglyceridemia: Secondary | ICD-10-CM | POA: Diagnosis present

## 2023-04-26 DIAGNOSIS — E039 Hypothyroidism, unspecified: Secondary | ICD-10-CM | POA: Diagnosis present

## 2023-04-26 DIAGNOSIS — R110 Nausea: Secondary | ICD-10-CM | POA: Diagnosis not present

## 2023-04-26 DIAGNOSIS — R7303 Prediabetes: Secondary | ICD-10-CM | POA: Diagnosis present

## 2023-04-26 DIAGNOSIS — Z79899 Other long term (current) drug therapy: Secondary | ICD-10-CM

## 2023-04-26 DIAGNOSIS — Z6841 Body Mass Index (BMI) 40.0 and over, adult: Secondary | ICD-10-CM

## 2023-04-26 DIAGNOSIS — I10 Essential (primary) hypertension: Secondary | ICD-10-CM | POA: Diagnosis present

## 2023-04-26 DIAGNOSIS — G40209 Localization-related (focal) (partial) symptomatic epilepsy and epileptic syndromes with complex partial seizures, not intractable, without status epilepticus: Secondary | ICD-10-CM | POA: Diagnosis present

## 2023-04-26 DIAGNOSIS — E782 Mixed hyperlipidemia: Secondary | ICD-10-CM | POA: Diagnosis present

## 2023-04-26 DIAGNOSIS — I471 Supraventricular tachycardia, unspecified: Secondary | ICD-10-CM

## 2023-04-26 DIAGNOSIS — Z7982 Long term (current) use of aspirin: Secondary | ICD-10-CM

## 2023-04-26 DIAGNOSIS — Z86718 Personal history of other venous thrombosis and embolism: Secondary | ICD-10-CM

## 2023-04-26 DIAGNOSIS — Z96649 Presence of unspecified artificial hip joint: Secondary | ICD-10-CM

## 2023-04-26 DIAGNOSIS — K660 Peritoneal adhesions (postprocedural) (postinfection): Secondary | ICD-10-CM | POA: Diagnosis present

## 2023-04-26 DIAGNOSIS — M488X9 Other specified spondylopathies, site unspecified: Secondary | ICD-10-CM

## 2023-04-26 DIAGNOSIS — Z79890 Hormone replacement therapy (postmenopausal): Secondary | ICD-10-CM

## 2023-04-26 DIAGNOSIS — Z96659 Presence of unspecified artificial knee joint: Secondary | ICD-10-CM

## 2023-04-26 DIAGNOSIS — Z9049 Acquired absence of other specified parts of digestive tract: Secondary | ICD-10-CM

## 2023-04-26 DIAGNOSIS — G40909 Epilepsy, unspecified, not intractable, without status epilepticus: Secondary | ICD-10-CM

## 2023-04-26 DIAGNOSIS — Z9889 Other specified postprocedural states: Secondary | ICD-10-CM

## 2023-04-26 HISTORY — PX: LYSIS OF ADHESIONS: SHX510349

## 2023-04-26 HISTORY — PX: ROBOT XI ASSISTED,LAPAROSCOPIC,SLEEVE GASTRECTOMY: SHX6092

## 2023-04-26 HISTORY — PX: LAPAROSCOPIC, OMENTOPEXY: SHX00100

## 2023-04-26 LAB — WHOLE BLOOD GLUCOSE POCT: Whole Blood Glucose POCT: 112 mg/dL — ABNORMAL HIGH (ref 70–100)

## 2023-04-26 SURGERY — ROBOT XI ASSISTED, LAPAROSCOPIC, SLEEVE GASTRECTOMY
Anesthesia: Anesthesia General | Site: Abdomen | Wound class: Clean Contaminated

## 2023-04-26 MED ORDER — POTASSIUM CHLORIDE IN NACL 20-0.9 MEQ/L-% IV SOLN
INTRAVENOUS | Status: DC
Start: 2023-04-26 — End: 2023-04-27

## 2023-04-26 MED ORDER — HYDROMORPHONE HCL 1 MG/ML IJ SOLN
0.5000 mg | INTRAMUSCULAR | Status: DC | PRN
Start: 2023-04-26 — End: 2023-04-26

## 2023-04-26 MED ORDER — ROCURONIUM BROMIDE 50 MG/5ML IV SOLN
INTRAVENOUS | Status: AC
Start: 2023-04-26 — End: ?
  Filled 2023-04-26: qty 10

## 2023-04-26 MED ORDER — MEPERIDINE HCL 25 MG/ML IJ SOLN
12.5000 mg | Freq: Once | INTRAMUSCULAR | Status: DC | PRN
Start: 2023-04-26 — End: 2023-04-26

## 2023-04-26 MED ORDER — MIDAZOLAM HCL 1 MG/ML IJ SOLN (WRAP)
INTRAMUSCULAR | Status: AC
Start: 2023-04-26 — End: ?
  Filled 2023-04-26: qty 2

## 2023-04-26 MED ORDER — SODIUM CHLORIDE 0.9% BAG (IRRIGATION USE)
INTRAVENOUS | Status: DC | PRN
Start: 2023-04-26 — End: 2023-04-26
  Administered 2023-04-26: 1000 mL

## 2023-04-26 MED ORDER — METOPROLOL TARTRATE 12.5 MG PO SPLIT TAB
12.5000 mg | ORAL_TABLET | Freq: Two times a day (BID) | ORAL | Status: DC
Start: 2023-04-26 — End: 2023-04-27
  Administered 2023-04-27: 12.5 mg via ORAL
  Filled 2023-04-26 (×3): qty 1

## 2023-04-26 MED ORDER — ENOXAPARIN SODIUM 40 MG/0.4ML IJ SOSY
40.0000 mg | PREFILLED_SYRINGE | Freq: Every day | INTRAMUSCULAR | Status: DC
Start: 2023-04-27 — End: 2023-04-26

## 2023-04-26 MED ORDER — LIDOCAINE HCL 2 % IJ SOLN
INTRAMUSCULAR | Status: AC
Start: 2023-04-26 — End: ?
  Filled 2023-04-26: qty 20

## 2023-04-26 MED ORDER — LACOSAMIDE 200 MG PO TABS
200.0000 mg | ORAL_TABLET | Freq: Two times a day (BID) | ORAL | Status: DC
Start: 2023-04-26 — End: 2023-04-27
  Administered 2023-04-26 – 2023-04-27 (×2): 200 mg via ORAL
  Filled 2023-04-26 (×2): qty 1

## 2023-04-26 MED ORDER — ENOXAPARIN SODIUM 40 MG/0.4ML IJ SOSY
40.0000 mg | PREFILLED_SYRINGE | Freq: Two times a day (BID) | INTRAMUSCULAR | Status: DC
Start: 2023-04-27 — End: 2023-04-27
  Administered 2023-04-27: 40 mg via SUBCUTANEOUS
  Filled 2023-04-26: qty 0.4

## 2023-04-26 MED ORDER — BUPIVACAINE LIPOSOME 1.3 % IJ SUSP
INTRAMUSCULAR | Status: AC
Start: 2023-04-26 — End: ?
  Filled 2023-04-26: qty 20

## 2023-04-26 MED ORDER — LEVETIRACETAM ER 500 MG PO TB24
1500.0000 mg | ORAL_TABLET | Freq: Every morning | ORAL | Status: DC
Start: 2023-04-27 — End: 2023-04-26

## 2023-04-26 MED ORDER — LACTATED RINGERS IR SOLN
Status: DC | PRN
Start: 2023-04-26 — End: 2023-04-26
  Administered 2023-04-26: 1000 mL

## 2023-04-26 MED ORDER — ONDANSETRON HCL 4 MG/2ML IJ SOLN
4.0000 mg | Freq: Three times a day (TID) | INTRAMUSCULAR | Status: DC | PRN
Start: 2023-04-26 — End: 2023-04-27
  Administered 2023-04-27 (×2): 4 mg via INTRAVENOUS
  Filled 2023-04-26 (×2): qty 2

## 2023-04-26 MED ORDER — MIDAZOLAM HCL 1 MG/ML IJ SOLN (WRAP)
INTRAMUSCULAR | Status: DC | PRN
Start: 2023-04-26 — End: 2023-04-26
  Administered 2023-04-26: 2 mg via INTRAVENOUS

## 2023-04-26 MED ORDER — EPHEDRINE SULFATE 50 MG/ML IJ/IV SOLN (WRAP)
Status: AC
Start: 2023-04-26 — End: ?
  Filled 2023-04-26: qty 1

## 2023-04-26 MED ORDER — ALBUTEROL SULFATE 1.25 MG/3ML IN NEBU
1.2500 mg | INHALATION_SOLUTION | RESPIRATORY_TRACT | Status: DC | PRN
Start: 2023-04-26 — End: 2023-04-27

## 2023-04-26 MED ORDER — SIMETHICONE 80 MG PO CHEW
80.0000 mg | CHEWABLE_TABLET | Freq: Four times a day (QID) | ORAL | Status: DC | PRN
Start: 2023-04-26 — End: 2023-04-27

## 2023-04-26 MED ORDER — OXYCODONE HCL 5 MG/5ML PO SOLN
5.0000 mg | ORAL | Status: DC | PRN
Start: 2023-04-26 — End: 2023-04-27
  Administered 2023-04-26 – 2023-04-27 (×4): 5 mg via ORAL
  Filled 2023-04-26 (×4): qty 5

## 2023-04-26 MED ORDER — SODIUM CHLORIDE (PF) 0.9 % IJ SOLN
INTRAMUSCULAR | Status: AC
Start: 2023-04-26 — End: ?
  Filled 2023-04-26: qty 10

## 2023-04-26 MED ORDER — FENTANYL CITRATE (PF) 50 MCG/ML IJ SOLN (WRAP)
INTRAMUSCULAR | Status: DC | PRN
Start: 2023-04-26 — End: 2023-04-26
  Administered 2023-04-26: 100 ug via INTRAVENOUS

## 2023-04-26 MED ORDER — PROCHLORPERAZINE EDISYLATE 10 MG/2ML IJ SOLN
5.0000 mg | Freq: Four times a day (QID) | INTRAMUSCULAR | Status: DC | PRN
Start: 2023-04-26 — End: 2023-04-27
  Administered 2023-04-26 (×2): 5 mg via INTRAVENOUS
  Filled 2023-04-26 (×2): qty 2

## 2023-04-26 MED ORDER — BUPIVACAINE LIPOSOME 1.3 % IJ SUSP
INTRAMUSCULAR | Status: DC | PRN
Start: 2023-04-26 — End: 2023-04-26
  Administered 2023-04-26: 20 mL

## 2023-04-26 MED ORDER — FENTANYL CITRATE (PF) 50 MCG/ML IJ SOLN (WRAP)
INTRAMUSCULAR | Status: AC
Start: 2023-04-26 — End: ?
  Filled 2023-04-26: qty 2

## 2023-04-26 MED ORDER — LEVETIRACETAM ER 500 MG PO TB24
750.0000 mg | ORAL_TABLET | Freq: Every evening | ORAL | Status: DC
Start: 2023-04-26 — End: 2023-04-26

## 2023-04-26 MED ORDER — HYOSCYAMINE SULFATE 0.125 MG SL SUBL
0.1250 mg | SUBLINGUAL_TABLET | Freq: Four times a day (QID) | SUBLINGUAL | Status: DC | PRN
Start: 2023-04-26 — End: 2023-04-27

## 2023-04-26 MED ORDER — SODIUM CHLORIDE BACTERIOSTATIC 0.9 % IJ SOLN
INTRAMUSCULAR | Status: DC | PRN
Start: 2023-04-26 — End: 2023-04-26
  Administered 2023-04-26: 40 mL via INTRAMUSCULAR

## 2023-04-26 MED ORDER — ACETAMINOPHEN 10 MG/ML IV SOLN
1000.0000 mg | Freq: Once | INTRAVENOUS | Status: AC
Start: 2023-04-26 — End: 2023-04-26
  Administered 2023-04-26: 1000 mg via INTRAVENOUS

## 2023-04-26 MED ORDER — SUGAMMADEX SODIUM 200 MG/2ML IV SOLN
INTRAVENOUS | Status: DC | PRN
Start: 2023-04-26 — End: 2023-04-26
  Administered 2023-04-26: 200 mg via INTRAVENOUS

## 2023-04-26 MED ORDER — GLUCOSE 40 % PO GEL (WRAP)
15.0000 g | ORAL | Status: DC | PRN
Start: 2023-04-26 — End: 2023-04-27

## 2023-04-26 MED ORDER — DEXTROSE 50 % IV SOLN
12.5000 g | INTRAVENOUS | Status: DC | PRN
Start: 2023-04-26 — End: 2023-04-27

## 2023-04-26 MED ORDER — PROPOFOL 10 MG/ML IV EMUL (WRAP)
INTRAVENOUS | Status: AC
Start: 2023-04-26 — End: ?
  Filled 2023-04-26: qty 40

## 2023-04-26 MED ORDER — ACETAMINOPHEN 160 MG/5ML PO SUSP/SOLN (WRAP)
650.0000 mg | ORAL | Status: DC
Start: 2023-04-27 — End: 2023-04-27

## 2023-04-26 MED ORDER — GLUCAGON 1 MG IJ SOLR (WRAP)
1.0000 mg | INTRAMUSCULAR | Status: DC | PRN
Start: 2023-04-26 — End: 2023-04-27

## 2023-04-26 MED ORDER — PROPOFOL INFUSION 10 MG/ML
INTRAVENOUS | Status: DC | PRN
Start: 2023-04-26 — End: 2023-04-26
  Administered 2023-04-26: 200 mg via INTRAVENOUS

## 2023-04-26 MED ORDER — HYDRALAZINE HCL 20 MG/ML IJ SOLN
10.0000 mg | Freq: Four times a day (QID) | INTRAMUSCULAR | Status: DC | PRN
Start: 2023-04-26 — End: 2023-04-27

## 2023-04-26 MED ORDER — FENTANYL CITRATE (PF) 50 MCG/ML IJ SOLN (WRAP)
25.0000 ug | INTRAMUSCULAR | Status: DC | PRN
Start: 2023-04-26 — End: 2023-04-26
  Administered 2023-04-26 (×3): 25 ug via INTRAVENOUS
  Filled 2023-04-26 (×2): qty 1

## 2023-04-26 MED ORDER — DEXMEDETOMIDINE HCL IN NACL 200 MCG/50ML IV SOLN
INTRAVENOUS | Status: AC
Start: 2023-04-26 — End: ?
  Filled 2023-04-26: qty 50

## 2023-04-26 MED ORDER — DEXMEDETOMIDINE HCL IN NACL 200 MCG/50ML IV SOLN
INTRAVENOUS | Status: DC | PRN
Start: 2023-04-26 — End: 2023-04-26
  Administered 2023-04-26: .3 ug/kg/h via INTRAVENOUS

## 2023-04-26 MED ORDER — PROCHLORPERAZINE EDISYLATE 10 MG/2ML IJ SOLN
INTRAMUSCULAR | Status: AC
Start: 2023-04-26 — End: ?
  Filled 2023-04-26: qty 2

## 2023-04-26 MED ORDER — ROCURONIUM BROMIDE 10 MG/ML IV SOLN (WRAP)
INTRAVENOUS | Status: DC | PRN
Start: 2023-04-26 — End: 2023-04-26
  Administered 2023-04-26: 80 mg via INTRAVENOUS

## 2023-04-26 MED ORDER — PHENYLEPHRINE HCL 10 MG/ML IV SOLN (WRAP)
Status: DC | PRN
Start: 2023-04-26 — End: 2023-04-26
  Administered 2023-04-26: 100 ug via INTRAVENOUS

## 2023-04-26 MED ORDER — LIDOCAINE HCL (PF) 1 % IJ SOLN
INTRAMUSCULAR | Status: DC | PRN
Start: 2023-04-26 — End: 2023-04-26
  Administered 2023-04-26: 100 mg via INTRAVENOUS

## 2023-04-26 MED ORDER — SCOPOLAMINE 1 MG/3DAYS TD PT72
1.0000 | MEDICATED_PATCH | TRANSDERMAL | Status: DC | PRN
Start: 2023-04-26 — End: 2023-04-27

## 2023-04-26 MED ORDER — FAMOTIDINE 10 MG/ML IV SOLN (WRAP)
20.0000 mg | Freq: Two times a day (BID) | INTRAVENOUS | Status: DC
Start: 2023-04-26 — End: 2023-04-27
  Administered 2023-04-26 – 2023-04-27 (×3): 20 mg via INTRAVENOUS
  Filled 2023-04-26 (×3): qty 2

## 2023-04-26 MED ORDER — PROCHLORPERAZINE EDISYLATE 10 MG/2ML IJ SOLN
10.0000 mg | INTRAMUSCULAR | Status: DC | PRN
Start: 2023-04-26 — End: 2023-04-26
  Administered 2023-04-26: 5 mg via INTRAVENOUS

## 2023-04-26 MED ORDER — HYDROMORPHONE HCL 1 MG/ML IJ SOLN
0.5000 mg | INTRAMUSCULAR | Status: DC | PRN
Start: 2023-04-26 — End: 2023-04-27
  Administered 2023-04-26: 0.5 mg via INTRAVENOUS
  Filled 2023-04-26: qty 0.5

## 2023-04-26 MED ORDER — MAGNESIUM SULFATE IN D5W 1-5 GM/100ML-% IV SOLN
INTRAVENOUS | Status: AC
Start: 2023-04-26 — End: ?
  Filled 2023-04-26: qty 100

## 2023-04-26 MED ORDER — ONDANSETRON HCL 4 MG/2ML IJ SOLN
INTRAMUSCULAR | Status: AC
Start: 2023-04-26 — End: ?
  Filled 2023-04-26: qty 2

## 2023-04-26 MED ORDER — ONDANSETRON HCL 4 MG/2ML IJ SOLN
INTRAMUSCULAR | Status: DC | PRN
Start: 2023-04-26 — End: 2023-04-26
  Administered 2023-04-26: 4 mg via INTRAVENOUS

## 2023-04-26 MED ORDER — STERILE WATER FOR INJECTION IJ/IV SOLN (WRAP)
2.0000 g | INTRAMUSCULAR | Status: AC
Start: 2023-04-26 — End: 2023-04-26
  Administered 2023-04-26: 2 g via INTRAVENOUS

## 2023-04-26 MED ORDER — ENOXAPARIN SODIUM 40 MG/0.4ML IJ SOSY
40.0000 mg | PREFILLED_SYRINGE | Freq: Once | INTRAMUSCULAR | Status: AC
Start: 2023-04-26 — End: 2023-04-26
  Administered 2023-04-26: 40 mg via SUBCUTANEOUS

## 2023-04-26 MED ORDER — ONDANSETRON HCL 4 MG/2ML IJ SOLN
4.0000 mg | Freq: Once | INTRAMUSCULAR | Status: AC | PRN
Start: 2023-04-26 — End: 2023-04-26
  Administered 2023-04-26: 4 mg via INTRAVENOUS
  Filled 2023-04-26: qty 2

## 2023-04-26 MED ORDER — LACTATED RINGERS IV SOLN
INTRAVENOUS | Status: DC | PRN
Start: 2023-04-26 — End: 2023-04-26

## 2023-04-26 MED ORDER — ENOXAPARIN SODIUM 40 MG/0.4ML IJ SOSY
PREFILLED_SYRINGE | INTRAMUSCULAR | Status: AC
Start: 2023-04-26 — End: ?
  Filled 2023-04-26: qty 0.4

## 2023-04-26 MED ORDER — LEVETIRACETAM ER 750 MG PO TB24
1500.0000 mg | ORAL_TABLET | Freq: Two times a day (BID) | ORAL | Status: DC
Start: 2023-04-26 — End: 2023-04-26

## 2023-04-26 MED ORDER — ACETAMINOPHEN 10 MG/ML IV SOLN
1000.0000 mg | Freq: Four times a day (QID) | INTRAVENOUS | Status: AC
Start: 2023-04-26 — End: 2023-04-27
  Administered 2023-04-26 – 2023-04-27 (×3): 1000 mg via INTRAVENOUS
  Filled 2023-04-26 (×3): qty 100

## 2023-04-26 MED ORDER — CEFAZOLIN SODIUM 1 G IJ SOLR
INTRAMUSCULAR | Status: AC
Start: 2023-04-26 — End: ?
  Filled 2023-04-26: qty 2000

## 2023-04-26 MED ORDER — ALBUMIN HUMAN/BIOSIMILIAR 5% IV SOLN (WRAP)
INTRAVENOUS | Status: DC | PRN
Start: 2023-04-26 — End: 2023-04-26

## 2023-04-26 MED ORDER — SUGAMMADEX SODIUM 200 MG/2ML IV SOLN
INTRAVENOUS | Status: AC
Start: 2023-04-26 — End: ?
  Filled 2023-04-26: qty 4

## 2023-04-26 MED ORDER — CLONAZEPAM 0.5 MG PO TABS
1.0000 mg | ORAL_TABLET | Freq: Three times a day (TID) | ORAL | Status: DC | PRN
Start: 2023-04-26 — End: 2023-04-27

## 2023-04-26 MED ORDER — DIPHENHYDRAMINE HCL 50 MG/ML IJ SOLN
12.5000 mg | Freq: Four times a day (QID) | INTRAMUSCULAR | Status: DC | PRN
Start: 2023-04-26 — End: 2023-04-27

## 2023-04-26 MED ORDER — MAGNESIUM SULFATE 50 % IJ SOLN
INTRAMUSCULAR | Status: DC | PRN
Start: 2023-04-26 — End: 2023-04-26
  Administered 2023-04-26: 1 g via INTRAVENOUS

## 2023-04-26 MED ORDER — BUPIVACAINE HCL (PF) 0.5 % IJ SOLN
INTRAMUSCULAR | Status: AC
Start: 2023-04-26 — End: ?
  Filled 2023-04-26: qty 30

## 2023-04-26 MED ORDER — DEXTROSE 10 % IV BOLUS
12.5000 g | INTRAVENOUS | Status: DC | PRN
Start: 2023-04-26 — End: 2023-04-27

## 2023-04-26 MED ORDER — LEVETIRACETAM 500 MG PO TABS
750.0000 mg | ORAL_TABLET | Freq: Every evening | ORAL | Status: AC
Start: 2023-04-26 — End: 2023-04-26
  Administered 2023-04-26: 750 mg via ORAL
  Filled 2023-04-26: qty 1

## 2023-04-26 SURGICAL SUPPLY — 82 items
ADHESIVE SKIN CLOSURE DERMABOND ADVANCED (Skin Closure) ×1
ADHESIVE SKIN CLOSURE DERMABOND ADVANCED .7 ML LIQUID APPLICATOR (Skin Closure) ×1 IMPLANT
APPLICATOR CHLORAPREP 26 ML 70% ISOPROPYL ALCOHOL 2% CHLORHEXIDINE (Applicator) ×2 IMPLANT
APPLICATOR PRP 70% ISPRP 2% CHG 26ML (Applicator) ×2
CATHETER IV OD14 GA L2 IN RADIOPAQUE (IV Supply) ×1
CATHETER IV OD14 GA L2 IN RADIOPAQUE JELCO (IV Supply) ×1 IMPLANT
CLIP SUTURE ABSORBABLE LAPRA-TY VICRYL (Suture) ×1
CLIP SUTURE ABSORBABLE LAPRA-TY VICRYL POLYDIOXANONE (Suture) ×1 IMPLANT
COVER EQUIPMENT MEDLINE SMS L53 IN X W24 (Drape) ×1
COVER EQUIPMENT MEDLINE SMS L53 IN X W24 IN MAYOSTAND (Drape) ×1 IMPLANT
COVER FLEXIBLE LIGHT HANDLE PLASTIC GREEN (Procedure Accessories) ×1 IMPLANT
COVER FLEXIBLE MEDLINE LIGHT HANDLE (Procedure Accessories) ×1
DRAPE COLUMN EQUIPMENT DA VINCI XI (Drape) ×1 IMPLANT
DRAPE EQP DVNC XI 21X19X10.5IN ARM 21LB (Drape) ×4
DRAPE EQP DVNC XI CLMN (Drape) ×1
DRAPE EQUIPMENT ARM L21 IN X W19 IN X H10.5 IN DA VINCI XI 21 LB (Drape) ×4 IMPLANT
DRAPE SURGICAL SHEET L77 IN X W53 IN (Drape) ×2
DRAPE SURGICAL SHEET L77 IN X W53 IN MEDLINE SMS 3/4 BLUE (Drape) ×2 IMPLANT
ELECTRODE ADULT PATIENT RETURN L9 FT REM POLYHESIVE ACRYLIC FOAM (Procedure Accessories) ×1 IMPLANT
ELECTRODE PATIENT RETURN L9 FT VALLEYLAB (Procedure Accessories) ×1
GLOVE SURGICAL 7 1/2 BIOGEL PI (Glove) ×1
GLOVE SURGICAL 7 1/2 BIOGEL PI INDICATOR (Glove) ×1
GLOVE SURGICAL 7 1/2 BIOGEL PI INDICATOR UNDERGLOVE POWDER FREE SMOOTH (Glove) ×1 IMPLANT
GLOVE SURGICAL 7 1/2 BIOGEL PI ULTRATOUCH M POWDER FREE BEAD CUFF (Glove) ×1 IMPLANT
GLOVE SURGICAL 7 BIOGEL PI INDICATOR (Glove) ×1
GLOVE SURGICAL 7 BIOGEL PI INDICATOR UNDERGLOVE POWDER FREE SMOOTH (Glove) ×1 IMPLANT
GLOVE SURGICAL 7 BIOGEL PI POWDER FREE (Glove) ×1
GLOVE SURGICAL 7 BIOGEL PI POWDER FREE MICRO ROUGHEN BEAD CUFF (Glove) ×1 IMPLANT
GOWN SURGICAL XL SMARTGOWN LEVEL 4 (Gown) ×2
GOWN SURGICAL XL SMARTGOWN LEVEL 4 BREATHABLE (Gown) ×2 IMPLANT
HANDLE LIGHT SNAP ON ASPEN (Procedure Accessories) ×1 IMPLANT
IRRIGATOR SUCTION ERGONOMIC HAND PIECE STRYKEFLOW II (Suction) ×1 IMPLANT
IRRIGATOR SUCTION STRYKEFLOW 2 (Suction) ×1
KIT INFECTION CONTROL CUSTOM (Kits) ×1
KIT INFECTION CONTROL CUSTOM IFOH03 (Kits) ×1 IMPLANT
MAT PATIENT L81 IN X W39 IN M2 (Positioning Supplies) ×1
MAT PATIENT L81 IN X W39 IN M2 MICROCLIMATE BODY PAD PREVALON MOBILE (Positioning Supplies) ×1 IMPLANT
NEEDLE SPINAL L3 1/2 IN REGULAR WALL QUINCKE TIP OD20 GA BD (Needles) ×1 IMPLANT
NEEDLE SPNL PP RW BD QNCK 20GA 3.5IN LF (Needles) ×1
OBTURATOR BLDLS 8MM LONG (Instrument) ×1
PACK SURGICAL GENERAL LAP SHARE (Tray) ×1
PACK SURGICAL GENERAL LAP SHARE MEDLINE INDUSTRIES, INC (Tray) ×1 IMPLANT
RELOAD STAPLER SUREFORM 60 DISPOSABLE BLUE (Staplers) ×3 IMPLANT
RELOAD STAPLER SUREFORM 60 GREEN (Staplers) ×1 IMPLANT
RELOAD STPLR SUREFORM 60 DISP BLU (Staplers) ×3
RELOAD STPLR SUREFORM 60 GRN (Staplers) ×1
SEAL DAVINCI UNIVERSAL 5-12MM (Procedure Accessories) ×4
SEAL DAVINCI UNIVERSAL ID5-12 MM CANNULA (Procedure Accessories) ×4 IMPLANT
SEALER TISS DVNC SLIM JAW XTD DISP (Instrument) ×1
SEALER TISSUE DA VINCI SLIM JAW EXTEND DISPOSABLE (Instrument) ×1 IMPLANT
SEALER/DIVIDER LAPAROSCOPIC L44 CM 350 D (Laparoscopy Supplies) ×1
SEALER/DIVIDER LAPAROSCOPIC L44 CM 350 D L18.5 MM MARYLAND CURVE JAW (Laparoscopy Supplies) ×1 IMPLANT
SET HIGH FLOW SMOKE EVACUATION (Tubing) ×1
SET HIGH FLOW SMOKE EVACUATION PNEUMOCLEAR TUBING (Tubing) ×1 IMPLANT
SOLUTION IV LACTATED RINGERS 1000 ML (IV Solutions) ×1
SOLUTION IV LACTATED RINGERS 1000 ML PLASTIC CONTAINER (IV Solutions) ×1 IMPLANT
SPONGE LAPAROTOMY L18 IN X W18 IN 4 PLY (Sponge) ×1
SPONGE LAPAROTOMY L18 IN X W18 IN 4 PLY RADIOPAQUE PYRONEMA FREE HIGH (Sponge) ×1 IMPLANT
STAPLER 60 INTUITIVE DA VINCI XI (Sealant) ×2 IMPLANT
STAPLER INTERNAL LINEAR CUTTER RELOADABLE DISP SUREFORM 60MM TITANIUM (Staplers) ×1 IMPLANT
STAPLER OBTURATOR ROBOTIC BLADELESS LONG DA VINCI XI ENDOWRIST 8MM (Instrument) ×1 IMPLANT
STAPLER SUREFORM 60 SEAMGUARD BIOABSORBABLE GREEN/BLUE (Sealant) ×2 IMPLANT
STAPLER SUREFORM 60 SPU (Staplers) ×1
SUTURE COATED VICRYL 2-0 SH L27 IN BRAID (Suture) ×1
SUTURE COATED VICRYL 2-0 SH L27 IN BRAID COATED VIOLET ABSORBABLE (Suture) ×1 IMPLANT
SUTURE COATED VICRYL PLUS 0 L54 IN BRAID (Suture) ×1
SUTURE COATED VICRYL PLUS 0 L54 IN BRAID ANTIBACTERIAL COATED VIOLET (Suture) ×1 IMPLANT
SUTURE MONOCRYL PLUS 4-0 PS-2 L27 IN (Suture) ×2
SUTURE MONOCRYL PLUS 4-0 PS-2 L27 IN MONOFILAMENT ANTIBACTERIAL UNDYED (Suture) ×2 IMPLANT
SUTURE PASSER WECK EFX CLASSIC (Procedure Accessories) ×1 IMPLANT
SYRINGE 30 ML CONCENTRIC TIP GRADUATE (Syringes, Needles) ×2
SYRINGE 30 ML CONCENTRIC TIP GRADUATE NONPYROGENIC DEHP FREE LOK (Syringes, Needles) ×2 IMPLANT
SYSTEM GASTRIC LAVAGE SELECTION VALVE FENESTRATION PATTERN 107CM 36FR (Procedure Accessories) ×1 IMPLANT
SYSTEM GASTRIC LAVAGE VALVE FENESTRATION 107CM 50FR SLEEVE BULB (Procedure Accessories) ×1 IMPLANT
SYSTEM GSTRC LAV THRMPLST ELSTMR VISIGI (Procedure Accessories) ×2
SYSTEM IMAGING 8X6IN CLEARIFY MICROFIBER WARM HUB TRCR WIPE DSPSBL (Kits) ×1 IMPLANT
SYSTEM IMG MRFBR CLEARIFY 8X6IN WRM HUB (Kits) ×1
TIP CAUT 8MM STD HOT SHR DVNC EWRST CAUT (Procedure Accessories) ×1
TIP ELECTROCAUTERY HOT SHEARS DA VINCI ENDOWRIST CAUTERY MONOPOLAR 8 (Procedure Accessories) ×1 IMPLANT
TIP SCISSORS ENDOCUT L19.3 MM (Instrument) ×1 IMPLANT
TROCAR LAPSCP EPTH XCL 5MM 100MM LF STRL (Laparoscopy Supplies) ×1
TROCAR OD5 MM L100 MM BLADELESS STABLE SLEEVE ENDOPATH XCEL ENDOSCOPIC (Laparoscopy Supplies) ×1 IMPLANT

## 2023-04-26 NOTE — Op Note (Addendum)
 FULL OPERATIVE NOTE    Date Time: 04/26/23 8:58 AM  Patient Name: Jennifer [name], Jennifer [name] (MRN: [id number])  Attending Physician: Gaylene Brooks, MD      Date of Operation:   04/26/2023    Providers Performing:   Surgeons and Role:     Gaylene Brooks, MD - Primary     * Nier, Rhona Raider, DO - Fellow  who was essential and present throughout the procedure to help in positioning, transfer, port placement, skin closure, retraction and exposure during critical parts of the procedure, also exchanging instruments and sutures No surgical resident was available.      Surgical First Assistant(s):   First Assistant: Gale Journey, who was essential and present throughout the procedure to help in   positioning, transfer, port placement, skin closure, retraction and   exposure during critical parts of the procedure, also exchanging instruments and sutures    Operative Procedure:   ROBOT XI ASSISTED, LAPAROSCOPIC, SLEEVE GASTRECTOMY: 43775 (CPT)  ROBOT XI ASSISTED, LAPAROSCOPIC, OMENTOPEXY W/ GASTRIC RESTRICTIVE PROCEDURE: 49329 (CPT)  LYSIS OF ADHESIONS:     Preoperative Diagnosis:   Pre-Op Diagnosis Codes:      * Morbid obesity [E66.01]  Seizure disorder  SVT s/p ablation  Hypertriglyceridemia  Prediabetes  Hypothyroidism  DDD  S/p hip and knee replacements  Hx diverticulitis  Hx colon resection due to perforation after cscope  Hx cholecystectomy 2018  Hx LLE DVT    Postoperative Diagnosis:   Post-Op Diagnosis Codes:     * Morbid obesity [E66.01]  Seizure disorder  SVT s/p ablation  Hypertriglyceridemia  Prediabetes  Hypothyroidism  DDD  S/p hip and knee replacements  Hx diverticulitis  Hx colon resection due to perforation after cscope  Hx cholecystectomy 2018  Hx LLE DVT    Anesthesia:   general    Findings:   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   Hiatal hernia was not present.  The sleeve was created using a 36-French VisiGi bougie    Indications:    Jennifer [name] is a 67 y.o. morbidly obese female who has failed multiple previous attempts at conservative weight loss who opted to proceed with robotic laparoscopic sleeve gastrectomy as a surgical weight loss option understanding all other options including gastric bypass and malabsorptive procedures and gastric banding.  After an extensive preoperative education workup, psychiatric and dietary counseling, meeting all the preoperative clearance criteria and being cleared by medicine was cleared and scheduled for surgery. For a detailed explanation of risks please refer to the hospital and/or office chart.    Operative Notes:    The patient was transferred from the ambulatory care unit to the OR, was placed in supine position on the OR table.  After general endotracheal intubation, the abdomen was prepped and draped in sterile fashion with ChloraPrep.       Using a 15 blade, a small incision was made at the right lateral aspect of the umbilicus.  An 5 mm da Vinci trocar with a 0-degree 5 mm laparoscope was used. Due to prior surgical adhesions, the entry was not easy and the decision was made to switch to Palmer's [place]. A 5 mm port was placed in [place] under direct visualization. The abdomen was insufflated with CO2. No injury was seen There was extensive adhesive disease present in the abdomen. The left periumbilical and left lateral port sites, both 8mm, were placed and we took down adhesions from the omentum to  the anterior abdominal wall. Just enough to fit all ports safely and be able to perform the surgery. The transverse colon remained adhered to the anterior abdominal wall as I did not wish to risk injury. The right lateral port site was free of any adhesions and no injury was noted from the visiport entry. A 12mm port was placed. We switched out the mid left abdominal port to a 12mm as the stapling port was close to the colon and we wanted to limit instrument exchanges.     At this point,  the robot was brought in from the left side of the patient  and after the patient was placed in a reverse Trendelenburg, the robot was docked.  I proceeded to the robotic console.  I proceeded to dissect the area at the angle of His to expose the left crus.  This was done using the vessel sealer and blunt dissection.  The anterior fat pad was then taken off using the vessel sealer. At this point, we proceeded to take down the gastrocolic ligament at the incisuria as the adhesive disease prevented more distal in the antrum. Using the vessel sealer, the lesser sac was entered and then the gastrocolic ligament along with the short gastrics were taken down all the way to the angle of His.  This area was dissected so the left crus could be seen all the way to the junction of the right crus. The 19F visiG was placed.  Using the [employer] 60 mm green load stapler with SEAMGUARD, I proceeded to staple near the incisuria.   I proceeded three blue 60 mm da Vinci staplers with [facility] were used to  create the sleeve along the 36 bougie.   A omental patch pexy of the staple line using a 3-0 absorbable suture in an interrupted fashion incorporating the staple line to the gastrocolic and gastro-splenic ligament.  The distal stomach was then clamped as the sleeve was submerged under water, and a leak test was performed through the 36-French VisiGi bougie.  The abdomen was distended to 50 mmHg with air.  There was no evidence of any air leak noted. The air was then suctioned and the bougie was removed.         The rest of the abdomen was checked.  There were no gross abnormalities. The sutures were then removed along with the fat pad at the angle of His,which had been removed.  The robot was then undocked.  We proceeded to take out the stomach through the 12 port without any difficulty.  The excised stomach was checked and all the staples were intact.  The 12 ports were closed using an 0 Vicryl suture with a Carter-Thomason, in a  figure-of-eight fashion. The liver retractor was then removed.  The trochars were then taken out under direct vision.  There was no evidence any bleeding.  The abdomen was deflated.  Each port site was irrigated with Saline.  The skin  at each port site was approximated using 4-0 Monocryl in subcuticular  fashion.  Dermabond was placed over the incision site.  The patient tolerated the procedure well, was transferred to the recovery room stable.  All counts were correct at the end of the procedure.              Estimated Blood Loss:   Minimal    Implants:   None       Drains:   Drains: no      Specimens:  ID Type Source Tests Collected by Time Destination   1 : Portion of Stomach Tissue Stomach (Specify if applicable) SURGICAL PATHOLOGY Gaylene Brooks, MD 04/26/2023 [phone]          Complications:   None    Signed by: Gaylene Brooks, MD  [facility]    Adhesive disease from colon to anterior abdominal wall (at end of lysis of adhesions)      Sleeve gastrectomy

## 2023-04-26 NOTE — H&P (Signed)
Drs. Fermin Schwab, [place], and [facility]   [address]   [place], [place] [address]   [phone]   [phone]     [address], [address]  [place], [place] [address]   [phone]   [phone]    I have examined the patient and reviewed the H&P. No pertinent change in the patient's condition since the H&P was completed.  Pt is for Robotic/Laparoscopic sleeve gastrectomy, possible hiatal hernia repair, EGD.    Gaylene Brooks, MD   6:49 AM 04/26/2023

## 2023-04-26 NOTE — Progress Notes (Signed)
NURSE NOTE SUMMARY  [facility] - 5NEW ORTHOPEDICS   Patient Name: Jennifer [name]   Attending Physician: Gaylene Brooks, MD   Today's date:   04/26/2023 LOS: 0 days   Shift Summary:                                                              Pt is drinking and voiding well. Pt voided , bladder scan residual. Pt IV fluids running at 168ml/hr. Pt pain well managed with PRN Oxycodone. Pt meeting pain goal of 3/10 for pain. Pt tolerating small sips of bariatric clear liquids, some nausea relieved by zofran and compazine. Pt ambulated in hall 242ft, standby assist.      Provider Notifications:        Rapid Response Notifications:  Mobility:      PMP Activity: Step 3 - Bed Mobility (04/26/2023 10:34 AM)     Weight tracking:  Family Dynamic:   Last 3 Weights for the past 72 hrs (Last 3 readings):   Weight   04/26/23 1036 110.1 kg (242 lb 12.8 oz)   04/26/23 0606 110.1 kg (242 lb 12.8 oz)             Last Bowel Movement   No data recorded

## 2023-04-26 NOTE — Anesthesia Postprocedure Evaluation (Signed)
Anesthesia Post Evaluation    Patient: Kendrick Caplin Gidley    Procedure(s):  ROBOT XI ASSISTED, LAPAROSCOPIC, SLEEVE GASTRECTOMY  ROBOT XI ASSISTED, LAPAROSCOPIC, OMENTOPEXY W/ GASTRIC RESTRICTIVE PROCEDURE  LYSIS OF ADHESIONS    Anesthesia type: general    Last Vitals:   Vitals Value Taken Time   BP 124/66 04/26/23 1000   Temp 36.2 C (97.1 F) 04/26/23 1000   Pulse 71 04/26/23 1010   Resp 15 04/26/23 1010   SpO2 97 % 04/26/23 1010                 Anesthesia Post Evaluation:     Patient Evaluated: bedside  Patient Participation: complete - patient cannot participate  Level of Consciousness: awake  Pain Score: 0  Pain Management: adequate  Multimodal analgesia pain management approach  Strategies: acetaminophen, other (use comment) and local anesthesia (magnesium)  Airway Patency: patent        Anesthetic complications: No      PONV Status: none    Cardiovascular status: acceptable  Respiratory status: acceptable  Hydration status: acceptable  Comments: Anesthesia Post Evaluation    Patient name:  Jacquelynne Hirsch Giebel    Procedures performed: Procedure(s):  ROBOT XI ASSISTED, LAPAROSCOPIC, SLEEVE GASTRECTOMY  ROBOT XI ASSISTED, LAPAROSCOPIC, OMENTOPEXY W/ GASTRIC RESTRICTIVE PROCEDURE   LYSIS OF ADHESIONS      Anesthesia type: General ETT      Patient location:Phase I PACU        Post pain: Patient not complaining of pain, continue current therapy     Mental Status:awake    Respiratory Function: tolerating room air    Cardiovascular: stable    Nausea/Vomiting: patient not complaining of nausea or vomiting    Hydration Status: adequate    Post assessment: no apparent anesthetic complications                     Signed by: Fernande Bras, MD, 04/26/2023 3:04 PM

## 2023-04-26 NOTE — Progress Notes (Signed)
Admission Note-  Patient admitted to Surgical unit at 1024. Hand off report received from PACU.    Patient oriented to room, bed in lowest position, bed alarm activated, call bell within reach.   Fall prevention protocols reviewed with patient and visitor.  Patient informed of visitor policy.  Patient has home medications with them: Pt had a bottle of mixed medicines in her bag.  Husband instructed to take medication home, and to bring her Extended release Keppra from home in the original bottle.  Pt and husband verbalized understanding.  Belongings reviewed and documented in Admission Navigator.  Patient has valuables that are required to be sent to security: none    Brief Clinical Picture:  Neuro AXOx4 pt is drowsy;   Resp - O2 status - 2L NC;   Cardiac - WNL  Pt is due to void by 3pm      Skin Assessment    4 eyes in 4 hours pressure injury assessment note:      Completed with: Tina   Unit & Time admitted: 1024             Bony Prominences: Check appropriate box; if wound is present enter wound assessment in LDA     Occiput:                 [x] WNL  []  Wound present  Face:                     [x] WNL  []  Wound present  Ears:                      [x] WNL  []  Wound present  Spine:                    [x] WNL  []  Wound present  Shoulders:             [x] WNL  []  Wound present  Elbows:                  [x] WNL  []  Wound present  Sacrum/coccyx:     [x] WNL  []  Wound present  Ischial Tuberosity:  [x] WNL  []  Wound present  Trochanter/Hip:      [x] WNL  []  Wound present  Knees:                   [x] WNL  []  Wound present  Ankles:                   [x] WNL  []  Wound present  Heels:                    [x] WNL  []  Wound present  Other pressure areas:  []  Wound location

## 2023-04-26 NOTE — Plan of Care (Signed)
Problem: Day of Surgery- Sleeve Gastrectomy Surgery  Goal: Stable vital signs and fluid balance  Outcome: Progressing  Flowsheets (Taken 04/26/2023 1642)  Stable vital signs and fluid balance:   Monitor vital signs   Monitor/assess O2 saturation   Monitor intake and output. Notify LIP if urine output is less than 240 mL in 8 hours   Monitor lab values. Notify LIP of abnormal results.  Note: Pt is drinking and voiding well.  Pt voided , bladder scan residual.  Pt IV fluids running at 134ml/hr.  Goal: Pain at adequate level  Flowsheets (Taken 04/26/2023 1642)  Pain at adequate level as identified by patient:   Identify patient comfort function goal   Evaluate if patient comfort function goal is met  Note: Pt pain well managed with PRN Oxycodone.  Pt meeting pain goal of 3/10 for pain.    Goal: Effective ventilation maintained  Outcome: Progressing  Flowsheets (Taken 04/26/2023 1642)  Effective ventilation maintained:   Monitor/assess patient's mental status and LOC   Monitor/assess O2 saturation  Note: Pt educated on use of IS.    Goal: Nutritional intake is adequate  Outcome: Progressing  Flowsheets (Taken 04/26/2023 1642)  Nutritional intake is adequate:   Monitor intake and output   Assess GI status (bowel sounds, nausea/vomiting, distention, flatus)   Administer acid-reducer meds as prescribed  Note: Pt tolerating small sips of bariatric clear liquids, some nausea relieved by zofran and compazine.   Goal: Mobility/activity is maintained at optimum level  Outcome: Progressing  Flowsheets (Taken 04/26/2023 1642)  Mobility/activity is maintained at optimum level:   Out of bed with assistance as needed   Out of bed to chair if indicated with assistance as needed   Ambulate at least once per shift with assistance  Note: Pt ambulated in hall 279ft, standby assist.

## 2023-04-26 NOTE — Consults (Signed)
Inpatient Bariatric Nutrition Education       Surgery Type: Laparoscopic Sleeve Gastrectomy   Surgery Date: 04/26/2023    Nutrition education done by surgeon's RD pre-operatively: Yes    Going Home Nutrition information sent via MyChart to patient: Yes    Contact information for surgeon and bariatric team included in MyChart message.    Edd Fabian MS RDN  [facility]  (Not on campus)

## 2023-04-26 NOTE — Discharge Instr - AVS First Page (Addendum)
Drs. Moazzez, Nain, Pourshojae, and Culbreath  [address], [address]   [place], [place] [address]   (O) [phone]   (F) [phone]    [address], [address]   [place], [place] [address]   (O) [phone]   (F) [phone]       PATIENT DISCHARGE INFORMATION: BARIATRIC SURGERY    We are concerned about your health and have developed a few guidelines to help you during your hospitalization and at home. Please call your physician if you have any questions about your care when you get home.    DIET    Only sugar free, caffeine free, non carbonated liquids are allowed. Bariatric clear liquids for 2 weeks plus protein shakes, and then bariatric full liquids for the following week until advanced by your physician or dietician.  Make sure to take small sips all day long. Please refer to the diet guidelines provided by your dietitian. Do not eat solids.     Increase your liquid intake from a minimum of 6 to 8 cups a day as tolerated. Sip on water throughout the day. Ensure to get a minimum of 50 grams (for women) and 63 grams (men) of protein per day from your supplement.     The 30/30 rule (not drinking liquids for 30 minutes before meals or 30 minutes after meals) will apply after you are finished with your liquid diet.    Avoid foods high in sugar or fat. Maintain an adequate protein intake. Avoid nuts, popcorn, some citrus fruits, and soft breads for 2 months. Take chewable multivitamin supplement daily starting two weeks after your surgery date.     If vomiting occurs, and persists more than 24 hours, please notify your doctor.    ACTIVITY    Avoid heavy lifting (more than 20 pounds) or strenuous exercise for at least 4 weeks.    Walk daily, gradually increasing speed with a goal of 2 miles.  Ok to go up and down stairs.    MEDICATION    Follow the medication instructions as written by your doctor. Break all medications in half or crush unless advised differently from your doctor.    You should  refrain from drinking alcohol, you should refrain from driving if you are taking a narcotic medication for pain.    If the pain is unrelieved by the prescribed medication, please call you doctor.    Take all medications as prescribed.  - Please start taking your vitamins 2 weeks after surgery    - Take the Tylenol (650 mg or 20 mL) every four hours for pain control for the next 3-5 days and then as needed.  Suggested times are 7:00am, 11:00am, 3:00pm, and 7:00pm.  You can add another dose at 11:00pm if needed before bedtime . Do not exceed 4 grams (4000 mg) of tylenol total in 24 hours. The current dosing will give you 2600 mg- 3250 mg which is below the daily maximum.     - Take the gabapentin three times a day for the next 3-5 days and then as needed for pain.  This is for sharper pain.    - Take the oxycodone (or Dilaudid, whichever you were prescribed) as needed for severe pain.  This is a narcotic and has additive properties if used and used for a prolonged period of time.  You cannot operate heavy machinery (a vehicle) while on narcotics.  Contact your insurance company for official recommendations for when you can drive after discontinuing   narcotic medications.     - Take Prevacid (lansoprazole) or Prilosec (omeprazole), whichever you were prescribed, every day.  This is for acid and prevention of ulcers.  It is important to take this even if you don't feel like you're having acid reflux.    - Use Zofran (ondansetron) as needed for nausea.    - If you have received Lovenox (blood thinner), please use as instructed (2 -4 weeks)    - If you are prescribed URSODIOL (Actigall), start that medication 2 weeks after surgery.  This is for prevention of gallstones.  You can still develop gallstones but this therapy helps to limit this process with rapid weight loss.     - IF you experience constipation:   Drink at least 80 oz fluids daily  Walk regularly   Dulcolax suppository daily x3 days to get things  moving   Enemas, as needed, to soften hard stools   If at least 5 days out from surgery, may try Miralax  Once you start having softer stools, add in a fiber supplement daily (ie Metamucil, Benefiber, Citrucel-SF etc)  If you are unable to have a bowel movement and feeling abdominal pain and/or nausea/vomiting, please notify the office      INCISION    Keep your incision clean and dry. You may shower daily and pat your incision dry. No bathing or swimming for 4 weeks.    Check you incision daily and notify your doctor if you notice any redness or drainage.    It is not necessary to take your temperature routinely, but if you feel like you have a fever and it is 101 degrees Farenheit or more, please call your doctor.    OTHER CARE MEASURES    Continue to use incentive spirometer 10 times per hour while awake until your next doctor visit.  Make sure you are drinking enough and urinating at least 4-6 times per day.    Notify physician with:  -fever >101  -one sided calf pain  -vomiting  -increasing abdominal pain  -sudden onset shoulder pain    MAKE SURE YOU REMAIN HYDRATED.  You can keep water with you at all times and should sip to maintain hydration.     TENTATIVE SCHEDULE:    7:00am:  Morning water 6-8oz, Breakfast, Tylenol/Gabapentin  8:00am:  Daily Medications  9:00am:  Protein Shake/Lovenox  10:00am:  Vitamins  11:00am:  Water 6-8 oz, Tylenol  12:00pm:  Lunch  1:00pm:  Water 6-8 oz  2:00pm:  Water 6-8 oz  3:00pm:  Medications (Gabapentin/Tylenol)  4:00pm:  Water 6-8 oz  5:00pm:  Dinner  6:00pm:  Water 6-8 oz  7:00pm:  Multivitamin, Tylenol  9:00pm:  Water, Gabapentin, second Lovenox (if prescribed twice daily)  11:00pm: Tylenol if needed    IF YOU ARE TOLD BY A PHYSICIAN TO RETURN TO THE ER, PLEASE ONLY GO TO [facility] EMERGENCY DEPARTMENT. Thank you.     CONGRATULATIONS on your commitment to your success.  Fall in love with your success and JOIN US ON FACEBOOK!    This online community is  designed to be a site for support to all of our POST OPERATIVE bariatric patients.  This will connect you with our staff and our patients.  Follow the process and share your story.  YOU ARE NOT ALONE and we all need to be engaged to support and educate each other.  Welcome to the [facility].    [email or web address]

## 2023-04-26 NOTE — Transfer of Care (Signed)
Anesthesia Transfer of Care Note    Patient: Jennifer [name]    Procedures performed: Procedure(s):  ROBOT XI ASSISTED, LAPAROSCOPIC, SLEEVE GASTRECTOMY  ROBOT XI ASSISTED, LAPAROSCOPIC, OMENTOPEXY W/ GASTRIC RESTRICTIVE PROCEDURE  LYSIS OF ADHESIONS    Anesthesia type: General ETT    Patient location:Phase I PACU    Last vitals:   Vitals:    04/26/23 0910   BP: 111/54   Pulse: 69   Resp: 22   Temp: 36.3 C (97.4 F)   SpO2: 95%       Post pain: Patient not complaining of pain, continue current therapy      Mental Status:lethargic    Respiratory Function: tolerating face mask    Cardiovascular: stable    Nausea/Vomiting: patient not complaining of nausea or vomiting    Hydration Status: adequate    Post assessment: no apparent anesthetic complications    Signed by: Olga Coaster., CRNA  04/26/23 9:15 AM

## 2023-04-27 ENCOUNTER — Other Ambulatory Visit: Payer: Self-pay | Admitting: Surgery

## 2023-04-27 LAB — BASIC METABOLIC PANEL
Anion Gap: 6 (ref 5.0–15.0)
BUN: 14 mg/dL (ref 7–21)
CO2: 28 meq/L (ref 17–29)
Calcium: 8.5 mg/dL (ref 8.5–10.5)
Chloride: 108 meq/L (ref 99–111)
Creatinine: 0.9 mg/dL (ref 0.4–1.0)
GFR: >60 mL/min/{1.73_m2} (ref >=60.0)
Glucose: 102 mg/dL — ABNORMAL HIGH (ref 70–100)
Potassium: 4 meq/L (ref 3.5–5.3)
Sodium: 142 meq/L (ref 135–145)

## 2023-04-27 LAB — CBC
Absolute nRBC: 0 10*3/uL (ref <=0.00)
Hematocrit: 36.5 % (ref 34.7–43.7)
Hemoglobin: 11.8 g/dL (ref 11.4–14.8)
MCH: 30.9 pg (ref 25.1–33.5)
MCHC: 32.3 g/dL (ref 31.5–35.8)
MCV: 95.5 fL (ref 78.0–96.0)
MPV: 11.4 fL (ref 8.9–12.5)
Platelet Count: 268 10*3/uL (ref 142–346)
RBC: 3.82 10*6/uL — ABNORMAL LOW (ref 3.90–5.10)
RDW: 13 % (ref 11–15)
WBC: 7.43 10*3/uL (ref 3.10–9.50)
nRBC %: 0 /100{WBCs} (ref <=0.0)

## 2023-04-27 LAB — MAGNESIUM: Magnesium: 2.3 mg/dL (ref 1.6–2.6)

## 2023-04-27 LAB — PHOSPHORUS: Phosphorus: 3.3 mg/dL (ref 2.3–4.7)

## 2023-04-27 MED ORDER — LANSOPRAZOLE 30 MG PO CPDR
30.0000 mg | DELAYED_RELEASE_CAPSULE | Freq: Every day | ORAL | 0 refills | Status: DC
Start: 2023-04-27 — End: 2023-04-27

## 2023-04-27 MED ORDER — GABAPENTIN 300 MG PO CAPS
300.0000 mg | ORAL_CAPSULE | Freq: Three times a day (TID) | ORAL | 0 refills | Status: DC
Start: 2023-04-27 — End: 2023-04-27

## 2023-04-27 MED ORDER — ENOXAPARIN SODIUM 40 MG/0.4ML IJ SOSY
40.0000 mg | PREFILLED_SYRINGE | Freq: Two times a day (BID) | INTRAMUSCULAR | 0 refills | Status: DC
Start: 2023-04-27 — End: 2023-04-27

## 2023-04-27 MED ORDER — METOPROLOL SUCCINATE ER 25 MG PO TB24
25.0000 mg | ORAL_TABLET | Freq: Every day | ORAL | Status: DC
Start: 2023-04-27 — End: 2023-06-21

## 2023-04-27 MED ORDER — ONDANSETRON 4 MG PO TBDP
4.0000 mg | ORAL_TABLET | Freq: Three times a day (TID) | ORAL | 0 refills | Status: DC | PRN
Start: 2023-04-27 — End: 2023-04-27

## 2023-04-27 MED ORDER — ACETAMINOPHEN 160 MG/5ML PO SOLN
500.0000 mg | ORAL | 0 refills | Status: DC | PRN
Start: 2023-04-27 — End: 2023-08-05

## 2023-04-27 MED ORDER — LEVETIRACETAM ER 500 MG PO TB24
1500.0000 mg | ORAL_TABLET | Freq: Two times a day (BID) | ORAL | Status: DC
Start: 2023-04-27 — End: 2023-04-27

## 2023-04-27 MED ORDER — METOPROLOL TARTRATE 25 MG PO TABS
12.5000 mg | ORAL_TABLET | Freq: Two times a day (BID) | ORAL | 0 refills | Status: DC
Start: 2023-04-27 — End: 2023-04-27

## 2023-04-27 MED ORDER — OXYCODONE HCL 5 MG/5ML PO SOLN
5.0000 mg | Freq: Four times a day (QID) | ORAL | 0 refills | Status: DC | PRN
Start: 2023-04-27 — End: 2023-04-27

## 2023-04-27 NOTE — UM Notes (Signed)
PATIENT NAME: Jennifer [name]  DOB: 05-19-1956  UNIT: 5NEW Orthopedics  LEVEL OF CARE: Acute    ADMISSION ORDER:  ADMIT TO INPATIENT (Order #[id number]) on 04/26/23       ADMISSION REVIEW:  Presents 04/26/2023 at 0535: 67yo female for Robotic/Laparoscopic sleeve gastrectomy, possible hiatal hernia repair, EGD.     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  Past Surgical History:  has [name] past surgical history that includes OTHER SURGICAL HISTORY (2018); COLONOSCOPY, DIAGNOSTIC (SCREENING); TONSILLECTOMY; ADENOIDECTOMY; Cholecystectomy (2018); CARDIAC ABLATION (2018); Back surgery (1973); Colon surgery (2008); Joint replacement; ARTHROPLASTY, KNEE, TOTAL (Left, 02/12/2020); APPENDECTOMY (OPEN) (2009); Small intestine surgery (2007); Spine surgery (11/26/1971); and EGD, BIOPSY (N/[name], 02/06/2023).    Arrival VS: Vitals BP: 141/74, Temp: 97.2 F (36.2 C), Temp src: Temporal, Heart Rate: 80, Resp Rate: 18, SpO2: 97 %, Height: 157.5 cm (5\' 2" ), Weight: 110.1 kg (242 lb 12.8 oz)    Vitals (Obtained in last 24 hours):  Temp:  [97.1 F (36.2 C)-98.8 F (37.1 C)]   Heart Rate:  [63-89]   Resp Rate:  [12-22]   BP: (103-129)/(54-83)   SpO2:  [95 %-99 %]   Height:  [157.5 cm (5\' 2" )]   Weight:  [110.1 kg (242 lb 12.8 oz)]   BMI (calculated):  [44.4]     Op Note:  Date of Operation:   04/26/2023     Providers Performing:   Surgeons and Role:     * Gaylene Brooks, MD - Primary     * Nier, Rhona Raider, DO - Fellow     Surgical First Assistant(s):   First Assistant: Gale Journey, who was essential and present throughout the procedure to help in   positioning, transfer, port placement, skin closure, retraction  and   exposure during critical parts of the procedure, also exchanging instruments and sutures     Operative Procedure:   ROBOT XI ASSISTED, LAPAROSCOPIC, SLEEVE GASTRECTOMY: 43775 (CPT)  ROBOT XI ASSISTED, LAPAROSCOPIC, OMENTOPEXY W/ GASTRIC RESTRICTIVE PROCEDURE: 49329 (CPT)  LYSIS OF ADHESIONS:      Preoperative Diagnosis:   Pre-Op Diagnosis Codes:      * Morbid obesity [E66.01]  Seizure disorder  SVT s/p ablation  Hypertriglyceridemia  Prediabetes  Hypothyroidism  DDD  S/p hip and knee replacements  Hx diverticulitis  Hx colon resection due to perforation after cscope  Hx cholecystectomy 2018  Hx LLE DVT     Postoperative Diagnosis:   Post-Op Diagnosis Codes:     * Morbid obesity [E66.01]  Seizure disorder  SVT s/p ablation  Hypertriglyceridemia  Prediabetes  Hypothyroidism  DDD  S/p hip and knee replacements  Hx diverticulitis  Hx colon resection due to perforation after cscope  Hx cholecystectomy 2018  Hx LLE DVT     Anesthesia:   general     Findings:   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   Hiatal hernia was not present.  The sleeve was created using [name] 36-French VisiGi bougie     Indications:   Jennifer [name] is [name] 67 y.o. morbidly obese female who has failed multiple previous attempts at conservative weight loss who opted to proceed with robotic laparoscopic sleeve gastrectomy as [name] surgical weight loss option understanding all other options including gastric bypass and  malabsorptive procedures and gastric banding.  After an extensive preoperative education workup, psychiatric and dietary counseling, meeting all the preoperative clearance criteria and being cleared by medicine was cleared and scheduled for surgery. For [name] detailed explanation of risks please refer to the hospital and/or office chart.     Operative Notes:    The patient was transferred from the ambulatory care unit to the OR, was placed in supine position on the OR table.  After general  endotracheal intubation, the abdomen was prepped and draped in sterile fashion with ChloraPrep.       Using [name] 15 blade, [name] small incision was made at the right lateral aspect of the umbilicus.  An 5 mm da Vinci trocar with [name] 0-degree 5 mm laparoscope was used. Due to prior surgical adhesions, the entry was not easy and the decision was made to switch to Palmer's point. [name] 5 mm port was placed in [place] under direct visualization. The abdomen was insufflated with CO2. No injury was seen There was extensive adhesive disease present in the abdomen. The left periumbilical and left lateral port sites, both 8mm, were placed and we took down adhesions from the omentum to the anterior abdominal wall. Just enough to fit all ports safely and be able to perform the surgery. The transverse colon remained adhered to the anterior abdominal wall as I did not wish to risk injury. The right lateral port site was free of any adhesions and no injury was noted from the visiport entry. [name] 12mm port was placed. We switched out the mid left abdominal port to [name] 12mm as the stapling port was close to the colon and we wanted to limit instrument exchanges.      At this point, the robot was brought in from the left side of the patient  and after the patient was placed in [name] reverse Trendelenburg, the robot was docked.  I proceeded to the robotic console.  I proceeded to dissect the area at the angle of His to expose the left crus.  This was done using the vessel sealer and blunt dissection.  The anterior fat pad was then taken off using the vessel sealer. At this point, we proceeded to take down the gastrocolic ligament at the incisuria as the adhesive disease prevented more distal in the antrum. Using the vessel sealer, the lesser sac was entered and then the gastrocolic ligament along with the short gastrics were taken down all the way to the angle of His.  This area was dissected so the left crus could be seen all the way to the  junction of the right crus. The 15F visiG was placed.  Using the [employer] 60 mm green load stapler with SEAMGUARD, I proceeded to staple near the incisuria.   I proceeded three blue 60 mm da Vinci staplers with [facility] were used to  create the sleeve along the 36 bougie.   [name] omental patch pexy of the staple line using [name] 3-0 absorbable suture in an interrupted fashion incorporating the staple line to the gastrocolic and gastro-splenic ligament.  The distal stomach was then clamped as the sleeve was submerged under water, and [name] leak test was performed through the 36-French VisiGi bougie.  The abdomen was distended to 50 mmHg with air.  There was no evidence of any air leak noted. The air was then suctioned and the bougie was removed.         The rest of the abdomen was checked.  There  were no gross abnormalities. The sutures were then removed along with the fat pad at the angle of His,which had been removed.  The robot was then undocked.  We proceeded to take out the stomach through the 12 port without any difficulty.  The excised stomach was checked and all the staples were intact.  The 12 ports were closed using an 0 Vicryl suture with [name] Carter-Thomason, in [name] figure-of-eight fashion. The liver retractor was then removed.  The trochars were then taken out under direct vision.  There was no evidence any bleeding.  The abdomen was deflated.  Each port site was irrigated with Saline.  The skin  at each port site was approximated using 4-0 Monocryl in subcuticular  fashion.  Dermabond was placed over the incision site.  The patient tolerated the procedure well, was transferred to the recovery room stable.  All counts were correct at the end of the procedure.        Estimated Blood Loss:   Minimal     Implants:   None        Drains:   Drains: no        Specimens:      ID Type Source Tests Collected by Time Destination   1 : Portion of Stomach Tissue Stomach (Specify if applicable) SURGICAL PATHOLOGY Gaylene Brooks, MD 04/26/2023 [phone]              Complications:   None      Medications:  Scheduled Meds:  Current Facility-Administered Medications   Medication Dose Route Frequency    acetaminophen  650 mg Oral Q4H    enoxaparin  40 mg Subcutaneous Q12H    famotidine  20 mg Intravenous Q12H [facility]    Lacosamide  200 mg Oral BID    metoprolol tartrate  12.5 mg Oral Q12H [facility]     Continuous Infusions:   0.9 % NaCl with KCl 20 mEq 130 mL/hr at 04/27/23 0209       MD NOTES, ASSESSMENT/PLAN:  Continue bariatric liquid diet and encourage 3-4 oz every hour  Continue IVFs perioperatively  Pain and nausea control  Encourage ambulation/incentive spirometer  Continue oral medications  DVT PPX: Lovenox   GI ppx: Pepcid    Primary Payor: MEDICARE / Plan: MEDICARE PART [name] AND B / Product Type: Medicare /     Olga Coaster, RN, BSN  Utilization Review RN   [facility]  (P) Main UR: [phone]  (P) Direct (Secure VM): [phone]  [phone]  Email: [email or web address]  Tax ID: [id number]    Note to Reviewer:  This clinical review is based on/compiled from documentation provided by the treatment team within the patient's medical record.

## 2023-04-27 NOTE — Plan of Care (Signed)
NURSE NOTE SUMMARY  [facility] - 5NEW ORTHOPEDICS   Patient Name: Jennifer [name]   Attending Physician: Gaylene Brooks, MD   Today's date:   04/27/2023 LOS: 1 days   Shift Summary:                                                              A+0X:4  Vitals: WNL  Pain: Pain is manageable on current regimen Managed with oxycodone Patient's Stated Comfort Functional Goal: 3-mild pain  Dressing: clean, dry & intact  Incision site/wound assessment: Lap sites x6  Dressing changed: no  Ambulating: yes Steady Gait  Voiding: yes Clear Yellow urine  Tolerating diet: Bariatric and Clear   Nausea/Vomiting: yes  Passing Gas: yes  BM: no       Bariatric : Patient able to tolerate 3 oz/hr without s/s of overfilling.         Net I&O for admission: Net IO Since Admission: 1,613 mL [04/27/23 0128]  Net I&O for past 24 hours:   Intake/Output Summary (Last 24 hours) at 04/27/2023 0128  Last data filed at 04/26/2023 2236  Gross per 24 hour   Intake 2138 ml   Output 525 ml   Net 1613 ml        Provider Notifications:      Rapid Response Notifications:  Mobility:      PMP Activity: Step 7 - Walks out of Room (04/26/2023 11:00 PM)     Weight tracking:  Family Dynamic:   Last 3 Weights for the past 72 hrs (Last 3 readings):   Weight   04/26/23 1036 110.1 kg (242 lb 12.8 oz)   04/26/23 0606 110.1 kg (242 lb 12.8 oz)             Recent Vitals Last Bowel Movement   BP: 125/71 (04/27/2023 12:43 AM)  Heart Rate: 74 (04/27/2023 12:43 AM)  Temp: 97.9 F (36.6 C) (04/27/2023 12:43 AM)  Resp Rate: 15 (04/27/2023 12:43 AM)  Height: 1.575 m (5\' 2" ) (04/26/2023 10:36 AM)  Weight: 110.1 kg (242 lb 12.8 oz) (04/26/2023 10:36 AM)  SpO2: 95 % (04/27/2023 12:43 AM)   No data recorded                Problem: Pain interferes with ability to perform ADL  Goal: Pain at adequate level as identified by patient  Outcome: Progressing     Problem: Side Effects from Pain Analgesia  Goal: Patient will experience minimal side effects of analgesic  therapy  Outcome: Progressing     Problem: Moderate/High Fall Risk Score >5  Goal: Patient will remain free of falls  Outcome: Progressing     Problem: Safety  Goal: Patient will be free from injury during hospitalization  Outcome: Progressing  Goal: Patient will be free from infection during hospitalization  Outcome: Progressing     Problem: Pain  Goal: Pain at adequate level as identified by patient  Outcome: Progressing     Problem: Discharge Barriers  Goal: Patient will be discharged home or other facility with appropriate resources  Outcome: Progressing     Problem: Psychosocial and Spiritual Needs  Goal: Demonstrates ability to cope with hospitalization/illness  Outcome: Progressing     Problem: Day of Surgery- Sleeve Gastrectomy Surgery  Goal: Stable vital signs  and fluid balance  Outcome: Progressing  Goal: Pain at adequate level  Outcome: Progressing  Goal: Effective ventilation maintained  Outcome: Progressing  Goal: Tissue perfusion is adequate  Outcome: Progressing  Goal: Nutritional intake is adequate  Outcome: Progressing  Goal: Mobility/activity is maintained at optimum level  Outcome: Progressing  Goal: Patient/patient care companion demonstrates understanding of surgery/treatment plan, medications, and discharge plans  Outcome: Progressing     Problem: Post Op Day 1- Sleeve Gastrectomy Surgery  Goal: Stable vital signs and fluid balance  Outcome: Progressing  Goal: Pain at adequate level  Outcome: Progressing  Goal: Effective ventilation maintained  Outcome: Progressing  Goal: Tissue perfusion is adequate  Outcome: Progressing  Goal: Nutritional Intake is adequate  Outcome: Progressing  Goal: Mobility/activity is maintained at optimum level  Outcome: Progressing  Goal: Patient/patient care companion demonstrates understanding of surgery/treatment plan, medications, and discharge plans  Outcome: Progressing

## 2023-04-27 NOTE — Progress Notes (Signed)
Patient discharge to home with family via Sales [employer] - Family/Friend to drive patient. Husband drive her home.  Discharge instructions given to patient and spouse. Patient and family verbalized understanding of discharge instructions.  IV removed, catheter intact.     Special discharge instructions: Incision care  Prescriptions: Delivered to patient room  Patient Belongings: given to patient  Supplies given to patient: 1oz measuring cups  Patient own medications returned: N/A     Provided education not to take gabapentin at home, it is your allergic medicine. Patient verbalized understanding.

## 2023-04-27 NOTE — Final Progress Note (DC Note for stay less than 48 (Addendum)
[facility]  Progress Note    Date Time: 04/27/2023  Patient Name: Jennifer [name]    Subjective:     Gilford Rile Salatino had no acute events overnight and feels well this morning. Nausea and pain are well controlled. she is tolerating bariatric clear liquids and ambulating and urinating without issues.     Medications:     Current Inpatient Medications with Last Dose Taken[1]    Physical Exam:     Visit Vitals  BP 121/70   Pulse 67   Temp 98.8 F (37.1 C) (Oral)   Resp 16   Ht 1.575 m (5\' 2" )   Wt 110.1 kg (242 lb 12.8 oz)   SpO2 97%   BMI 44.41 kg/m         Intake and Output Summary (Last 24 hours) at Date Time    Intake/Output Summary (Last 24 hours) at 07/18/17 0643  Last data filed at 07/18/17 0118      Intake/Output Summary (Last 24 hours) at 04/27/2023 9811  Last data filed at 04/27/2023 0358  Gross per 24 hour   Intake 2138 ml   Output 875 ml   Net 1263 ml       General appearance - alert, well appearing, and in no distress  Chest - nonlabored respirations, equal chest rise bilaterally on room air, no accessory muscle use  Heart - normal rate, regular rhythm  Abdomen - soft, obese, non distended, incisions intact and appropriately tender with skin glue  Extremities - no pedal edema    Labs:     Results       Procedure Component Value Units Date/Time    Basic Metabolic Panel [[id number]]  (Abnormal) Collected: 04/27/23 0513    Specimen: Blood, Venous Updated: 04/27/23 0635     Glucose 102 mg/dL      BUN 14 mg/dL      Creatinine 0.9 mg/dL      Calcium 8.5 mg/dL      Sodium 213 mEq/L      Potassium 4.0 mEq/L      Chloride 108 mEq/L      CO2 28 mEq/L      Anion Gap 6.0     GFR >60.0 mL/min/1.73 m2     Magnesium [[id number]]  (Normal) Collected: 04/27/23 0513    Specimen: Blood, Venous Updated: 04/27/23 0635     Magnesium 2.3 mg/dL     Phosphorus [[id number]]  (Normal) Collected: 04/27/23 0513    Specimen: Blood, Venous Updated: 04/27/23 0635     Phosphorus 3.3 mg/dL     CBC without Differential  [[id number]]  (Abnormal) Collected: 04/27/23 0513    Specimen: Blood, Venous Updated: 04/27/23 0554     WBC 7.43 x10 3/uL      Hemoglobin 11.8 g/dL      Hematocrit 53.6 %      Platelet Count 268 x10 3/uL      MPV 11.4 fL      RBC 3.82 x10 6/uL      MCV 95.5 fL      MCH 30.9 pg      MCHC 32.3 g/dL      RDW 13 %      nRBC % 0.0 /100 WBC      Absolute nRBC 0.00 x10 3/uL               Assessment:     This is a 67 y.o. female with obesity who is status post Robotic/laparoscopic sleeve gastrectomy and extensive  lysis of adhesions on 04/26/2023, doing well.       Plan:     Continue bariatric liquid diet and encourage 3-4 oz every hour  Continue IVFs perioperatively  Pain and nausea control  Encourage ambulation/incentive spirometer  Continue oral medications  DVT PPX: Lovenox   GI ppx: Pepcid  Discharge home once meeting all milestones, pending fluid intake, pain and nausea control    ___________________  Eugenia Mcalpine, DO  Bariatric Surgery Fellow  [facility] [phone]    Doing great. No issues. Voiding and ambulating. Tolerating 4-5oz/hr w/o issues. Pain controlled. Ok for discharge.    Gilford Silvius, MD  Bariatric Surgery  Cell [phone]  Office [phone]         [1]   Current Facility-Administered Medications   Medication Dose Route Frequency Last Rate Last Admin    0.9 % NaCl with KCl 20 mEq infusion   Intravenous Continuous 130 mL/hr at 04/27/23 0209 New Bag at 04/27/23 0209    acetaminophen (TYLENOL) 160 MG/5ML oral liquid 650 mg  650 mg Oral Q4H        albuterol (ACCUNEB) nebulizer solution 1.25 mg  1.25 mg Nebulization Q4H WA PRN        clonazePAM (KlonoPIN) tablet 1 mg  1 mg Oral TID PRN        dextrose (GLUCOSE) 40 % oral gel 15 g of glucose  15 g of glucose Oral PRN        Or    dextrose (D10W) 10% bolus 125 mL  12.5 g Intravenous PRN        Or    dextrose 50 % bolus 12.5 g  12.5 g Intravenous PRN        Or    glucagon (rDNA) (GLUCAGEN) injection 1 mg  1 mg Intramuscular PRN         diphenhydrAMINE (BENADRYL) injection 12.5 mg  12.5 mg Intravenous Q6H PRN        enoxaparin (LOVENOX) syringe 40 mg  40 mg Subcutaneous Q12H        famotidine (PEPCID) injection 20 mg  20 mg Intravenous Q12H SCH   20 mg at 04/26/23 2053    hydrALAZINE (APRESOLINE) injection 10 mg  10 mg Intravenous Q6H PRN        HYDROmorphone (DILAUDID) injection 0.5 mg  0.5 mg Intravenous Q3H PRN   0.5 mg at 04/26/23 2052    hyoscyamine SL (LEVSIN/SL) tablet 0.125 mg  0.125 mg Sublingual Q6H PRN        Lacosamide (VIMPAT) tablet 200 mg  200 mg Oral BID   200 mg at 04/26/23 1752    metoprolol tartrate (LOPRESSOR) half tablet 12.5 mg  12.5 mg Oral Q12H SCH        ondansetron (ZOFRAN) injection 4 mg  4 mg Intravenous Q8H PRN   4 mg at 04/27/23 0204    oxyCODONE (ROXICODONE) 5 MG/5ML solution 5 mg  5 mg Oral Q4H PRN   5 mg at 04/27/23 0200    prochlorperazine (COMPAZINE) injection 5 mg  5 mg Intravenous Q6H PRN   5 mg at 04/26/23 2052    scopolamine (TRANSDERM-SCOP) patch 1 mg/72 hrs 1 patch  1 patch Transdermal Q72H PRN        simethicone (MYLICON) chewable tablet 80 mg  80 mg Oral Q6H PRN

## 2023-04-28 ENCOUNTER — Encounter: Payer: Self-pay | Admitting: Surgery

## 2023-04-28 ENCOUNTER — Other Ambulatory Visit (INDEPENDENT_AMBULATORY_CARE_PROVIDER_SITE_OTHER): Payer: Self-pay | Admitting: Internal Medicine

## 2023-05-02 LAB — SURGICAL PATHOLOGY

## 2023-05-03 ENCOUNTER — Telehealth (HOSPITAL_BASED_OUTPATIENT_CLINIC_OR_DEPARTMENT_OTHER): Payer: Self-pay | Admitting: Registered"

## 2023-05-03 NOTE — Telephone Encounter (Signed)
Called pt for "has symptoms" alert. S/p SG 04/26/23.  LM with office contact information.

## 2023-05-08 ENCOUNTER — Ambulatory Visit (INDEPENDENT_AMBULATORY_CARE_PROVIDER_SITE_OTHER): Payer: Self-pay | Admitting: Family

## 2023-05-08 ENCOUNTER — Ambulatory Visit (INDEPENDENT_AMBULATORY_CARE_PROVIDER_SITE_OTHER): Payer: Medicare Other | Admitting: Registered"

## 2023-05-08 ENCOUNTER — Encounter (INDEPENDENT_AMBULATORY_CARE_PROVIDER_SITE_OTHER): Payer: Self-pay | Admitting: Family

## 2023-05-08 VITALS — BP 102/66 | HR 56 | Ht 62.0 in | Wt 235.0 lb

## 2023-05-08 DIAGNOSIS — Z713 Dietary counseling and surveillance: Secondary | ICD-10-CM

## 2023-05-08 DIAGNOSIS — E43 Unspecified severe protein-calorie malnutrition: Secondary | ICD-10-CM

## 2023-05-08 DIAGNOSIS — Z9884 Bariatric surgery status: Secondary | ICD-10-CM

## 2023-05-08 DIAGNOSIS — Z1321 Encounter for screening for nutritional disorder: Secondary | ICD-10-CM

## 2023-05-08 NOTE — Progress Notes (Signed)
Post-Operative Progress Note    Patient Name: Jennifer [name], Jennifer [name] A  Age: 67 y.o.  Sex: female   DOB: January 05, 1956  MRN: [id number]    Subjective:     Jennifer [name] returns for postop follow up 12 days after Robotic Assisted Laparoscopic Sleeve Gastrectomy.   She feels well.   She denies fever, chills, or cough.   She has no nausea, vomiting, reflux, or abdominal pain.  She denies calf swelling or leg pain.  She has been tolerating liquids without problems.   She is drinking 64 oz of fluids and getting 60 grams of protein from supplements daily.   She is tolerating increased activity and walking regularly.   Pt says surgical incisions are CDI    Pt has concerns re: needs to speak with neurologist regarding Keppra    Pathology - Portion of stomach, sleeve gastrectomy:  Stomach with no specific pathologic alteration  Negative for H. pylori, intestinal metaplasia or dysplasia     Did patient use opioids post op: yes  Total Opioids used: 75 mLs of liquid oxycodone  Pain score (0-10): 0  Additional opioids provided? none  Unused opioids returned? Used all    Past Medical History:     Past Medical History:   Diagnosis Date    Abnormal vision     glasses    Arrhythmia 2018    SVT (had ablation)    Arthritis     Complication of anesthesia     urinary retention with every joint replacement, BP drop with spinal X1, another time spinal didn't work    Convulsions     epilepsy, last seizure 2010    Deep venous thrombosis of distal lower extremity 2018    after cholecystectomy; left leg    Diverticulitis     takes miralax    Encounter for blood transfusion 2018    Gastric ulceration 2018    GI bleed with requiring 4 units PRBCs per pt    Hyperlipidemia     Hypothyroidism     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 Authorizing Provider   acetaminophen (TYLENOL) 160 MG/5ML solution Take 15.6 mLs (500 mg) by mouth every 4 (four) hours as needed for Fever or Pain 04/27/23   Nier, Rhona Raider, DO   clonazePAM (KlonoPIN) 1 MG tablet Take 1 tablet (1 mg) by mouth 3 (three) times daily as needed    [provider]   enoxaparin (LOVENOX) 40 MG/0.4ML syringe Inject 0.4 mLs (40 mg) into the skin 2 (two) times daily for 14 days 04/27/23 05/11/23  Lorinda Creed, DO   gabapentin (NEURONTIN) 300 MG capsule Take 1 capsule (300 mg) by mouth 3 (three) times daily for 3 days 04/27/23 04/30/23  Lorinda Creed, DO   Keppra XR 750 MG extended release 24 hr tablet Takes 1,500 mg po q AM and 750 mg po q PM 10/29/22   [provider]   Lacosamide (Vimpat) 200 MG Tab Take by mouth 2 (two) times daily    [provider]   lansoprazole (PREVACID) 30 MG capsule Take 1 capsule (30 mg) by mouth daily 04/27/23 10/24/23  Lorinda Creed, DO  levothyroxine (SYNTHROID) 100 MCG tablet Take 1 tablet (100 mcg) by mouth Once each week on Sunday morning  Patient taking differently: Take 1 tablet (100 mcg) by mouth Once each week on Sunday morning Sunday  Monday-Saturday 03/03/23   Lavonia Drafts, FNP   metoprolol succinate XL (TOPROL-XL) 25 MG 24 hr tablet Take 1 tablet (25 mg) by mouth daily Do not take for two weeks. Take Metoprolol tartrate 12.5 mg two times daily, instead. 04/27/23   Lorinda Creed, DO   metoprolol tartrate (LOPRESSOR) 25 MG tablet Take 0.5 tablets (12.5 mg) by mouth every 12 (twelve) hours for 14 days 04/27/23 05/11/23  Lorinda Creed, DO   ondansetron (ZOFRAN-ODT) 4 MG disintegrating tablet Take 1 tablet (4 mg) by mouth every 8 (eight) hours as needed for Nausea 04/27/23   Lorinda Creed, DO   rosuvastatin (CRESTOR) 5 MG tablet Take 1 tablet (5 mg) by mouth nightly 04/28/23   Excell Seltzer, MD       Review of Systems:     Constitutional: negative for fevers, night sweats  Head and  neck: denies blurry vision, headaches, dry mouth, or hearing impairment  Respiratory: negative for SOB, cough  Cardiovascular: negative for chest pain, palpitations  Gastrointestinal: negative for abdominal pain or bloating  Genitourinary: negative for hematuria, dysuria  Musculoskeletal: negative for bone pain, myalgias, or stiff joints  Neurological: negative for dizziness, gait problems, headaches and memory problems  Behavioral/Psych: negative for fatigue, loss of interest in favorite activities, sleep disturbance  Endocrine: negative for temperature intolerance      Physical Exam:     Vitals:    05/08/23 0900   BP: 102/66   Pulse: (!) 56     Wt Readings from Last 10 Encounters:   05/08/23 235 lb   04/26/23 242 lb 12.8 oz   04/10/23 250 lb   04/05/23 245 lb   03/04/23 245 lb   02/06/23 245 lb   02/01/23 245 lb   01/15/23 245 lb   01/02/23 246 lb   12/26/22 248 lb     Appearance: Comfortable, no acute distress, well nourished  HEENT: Normocephalic, clear conjunctiva.   Chest: Breathing unlabored. S1, S2, LS clear.  Neuro: A&Ox3  Psych: Calm, cooperative  Abd: Soft, non-tender, no palpable/visible abnormalities. Surgical incisions CDI.      Labs Reviewed:     Lab Results   Component Value Date    WBC 7.43 04/27/2023    HGB 11.8 04/27/2023    HCT 36.5 04/27/2023    MCV 95.5 04/27/2023    PLT 268 04/27/2023     '  Chemistry        Component Value Date/Time    NA 142 04/27/2023 0513    NA 142 10/19/2022 0821    K 4.0 04/27/2023 0513    K 4.2 10/19/2022 0821    CL 108 04/27/2023 0513    CL 105 10/19/2022 0821    CO2 28 04/27/2023 0513    CO2 26 10/19/2022 0821    BUN 14 04/27/2023 0513    BUN 18.0 10/19/2022 0821    CREAT 0.9 04/27/2023 0513    CREAT 0.8 10/19/2022 0821    GLU 102 (H) 04/27/2023 0513    GLU 116 (H) 10/19/2022 0821        Component Value Date/Time    CA 8.5 04/27/2023 0513    CA 9.6 10/19/2022 0821    ALKPHOS 46 03/04/2023 1242    ALKPHOS 47  10/19/2022 0821    AST 17 03/04/2023 1242    AST 13  10/19/2022 0821    ALT 13 03/04/2023 1242    ALT 16 10/19/2022 0821    BILITOTAL 0.4 03/04/2023 1242    BILITOTAL 0.3 10/19/2022 0821          Lab Results   Component Value Date    ALT 13 03/04/2023    AST 17 03/04/2023    ALKPHOS 46 03/04/2023    BILITOTAL 0.4 03/04/2023     Lab Results   Component Value Date    VITD 74 03/04/2023     Lab Results   Component Value Date    B12 429 03/04/2023     No results found for: "VITAMINB1"  No results found for: "VITAMINARETI"  Cholesterol   Date Value Ref Range Status   03/04/2023 160 <=199 mg/dL Final   14/78/2956 213 0 - 199 mg/dL Final   08/65/7846 962 0 - 199 mg/dL Final     Triglycerides   Date Value Ref Range Status   03/04/2023 281 (H) 34 - 149 mg/dL Final   95/28/4132 440 (H) 34 - 149 mg/dL Final   03/24/2535 644 (H) 34 - 149 mg/dL Final     HDL   Date Value Ref Range Status   03/04/2023 36 (L) >=40 mg/dL Final     Comment:     An HDL Cholesterol 40mg /dL is low and constitutes a coronary heart disease risk factor, and an HDL Cholesterol >59mg /dL is a negative risk factor for CHD.     Reference: American Heart Association; Circulation 2004   10/19/2022 40 40 - 9,999 mg/dL Final     Comment:     An HDL cholesterol <40 mg/dL is low and constitutes a  coronary heart disease risk factor, and HDL-C>59 mg/dL is  a negative risk factor for CHD.  Ref: American Heart Association; Circulation 2004     04/06/2022 44 40 - 9,999 mg/dL Final     Comment:     An HDL cholesterol <40 mg/dL is low and constitutes a  coronary heart disease risk factor, and HDL-C>59 mg/dL is  a negative risk factor for CHD.  Ref: American Heart Association; Circulation 2004       Cholesterol / HDL Ratio   Date Value Ref Range Status   03/04/2023 4.4 Index Final     Comment:     Cholesterol/HDL Ratio:  Classification                   Female     Female  Very Low (1/2 Average Risk)      <3.4     <3.3  Low Risk                          4.0      3.8  Average Risk                      5.0      4.5  Moderate  Risk(2x  Average Risk)   9.5      7.0  High Risk (3x Average Risk)      >23.0    >11.0     10/19/2022 4.3 See Below Index Final     Comment:     Chol/HDL Ratio:  Classification                   Female  Female  Very Low (1/2 Average Risk)      <3.4     <3.3  Low Risk                         4.0      3.8  Average Risk                     5.0      4.5  Moderate Risk (2X Average risk)  9.5      7.0  High Risk (3X Average Risk)      >23.0    >11.0     04/06/2022 4.2 See Below Index Final     Comment:     Chol/HDL Ratio:  Classification                   Female     Female  Very Low (1/2 Average Risk)      <3.4     <3.3  Low Risk                         4.0      3.8  Average Risk                     5.0      4.5  Moderate Risk (2X Average risk)  9.5      7.0  High Risk (3X Average Risk)      >23.0    >11.0       Lab Results   Component Value Date    TSH 4.07 03/04/2023    TSH 4.55 07/12/2022     Lab Results   Component Value Date    CA 8.5 04/27/2023    PHOS 3.3 04/27/2023     Lab Results   Component Value Date    IRON 104 03/04/2023    TIBC 317 03/04/2023     Lab Results   Component Value Date    HGBA1C 5.9 (H) 03/04/2023           Rads:   Radiological Procedure reviewed.     Radiology Results (24 Hour)       ** No results found for the last 24 hours. **          Assessment/Plan   Status post Robotic Assisted Laparoscopic Sleeve Gastrectomy x 12 days. No postoperative complications. Tolerating diet without difficulties.     1. Advance diet per Registered Dietician. Continue fluids to a goal of 64 or more oz daily. Continue protein from supplements to a goal of 50-60 grams daily.   2. Increase activity as tolerated.  No heavy lifting for a total of 4-6 weeks after surgery.  3. Medications reviewed. Continue vitamins.  Begin ursodiol with the soft food diet if prescribed.  4. Follow up with PCP regularly.    5. Routine care in six weeks or sooner if needed.      Signed by: Lestine Mount, FNP,          [1]   Past  Surgical History:  Procedure Laterality Date    ADENOIDECTOMY      APPENDECTOMY (OPEN)  2009    ARTHROPLASTY, KNEE, TOTAL Left 02/12/2020    Procedure: LEFT ARTHROPLASTY, KNEE, TOTAL;  Surgeon: Harvie Bridge, MD;  Location: [facility];  Service: Orthopedics;  Laterality: Left;    BACK SURGERY  1973  lumbar    CARDIAC ABLATION  2018    SVT    CHOLECYSTECTOMY  2018    COLON SURGERY  2008    resection, perforated colon with colonoscopy    COLONOSCOPY, DIAGNOSTIC (SCREENING)      EGD, BIOPSY N/A 02/06/2023    Procedure: ESOPHAGOGASTRODUODENOSCOPY (EGD), BIOPSY;  Surgeon: Tommye Standard, MD;  Location: [facility];  Service: General;  Laterality: N/A;    JOINT REPLACEMENT      bilat hips, L 2005, R 2010, right knee 2015    LAPAROSCOPIC, OMENTOPEXY N/A 04/26/2023    Procedure: ROBOT XI ASSISTED, LAPAROSCOPIC, OMENTOPEXY W/ GASTRIC RESTRICTIVE PROCEDURE;  Surgeon: Gaylene Brooks, MD;  Location: [facility];  Service: General;  Laterality: N/A;    LYSIS OF ADHESIONS N/A 04/26/2023    Procedure: LYSIS OF ADHESIONS;  Surgeon: Gaylene Brooks, MD;  Location: [facility];  Service: General;  Laterality: N/A;    OTHER SURGICAL HISTORY  2018    IVC filter    ROBOT XI ASSISTED,LAPAROSCOPIC,SLEEVE GASTRECTOMY N/A 04/26/2023    Procedure: ROBOT XI ASSISTED, LAPAROSCOPIC, SLEEVE GASTRECTOMY;  Surgeon: Gaylene Brooks, MD;  Location: [facility];  Service: General;  Laterality: N/A;    SMALL INTESTINE SURGERY  2007    Colon resection post colonoscopy    SPINE SURGERY  11/26/1971    TONSILLECTOMY     [2]   Family History  Problem Relation Name Age of Onset    Cancer Mother Magdalen Spatz         lung    Arthritis Mother Magdalen Spatz     Heart disease Mother Magdalen Spatz         A fib/ chf    Stroke Mother Magdalen Spatz     Heart attack Father Vilinda Boehringer 61    Cancer Son Phoebe Sharps         Thyroid    Cancer Maternal Uncle Eusebio Friendly    [3]   Allergies  Allergen Reactions     Gabapentin      weezing

## 2023-05-08 NOTE — Progress Notes (Signed)
S:   Jennifer [name] presents for f/u after SG surgery. Pt is 12 days post-op.  Pt states tolerating clear liquid diet well.    GI: some nausea, zofran helps. Miralax to help manage constipation.    Currently, pt is able to consume about 64 oz clear fluids daily.  Fluids include: water, Gatorade Zero    Supplemental protein: Pt  reports consuming 60 grams protein daily from a supplemental source of BA and Ensure Max and Protality.     O:    Surgery Date: 04/26/23  Today's Wt:   235lb  Previous Wts:    Wt Readings from Last 10 Encounters:   05/08/23 235 lb   04/26/23 242 lb 12.8 oz   04/10/23 250 lb   04/05/23 245 lb   03/04/23 245 lb   02/06/23 245 lb   02/01/23 245 lb   01/15/23 245 lb   01/02/23 246 lb   12/26/22 248 lb    BMI:  There is no height or weight on file to calculate BMI.    A:  Pt appears to be getting adequate hydration and adequate protein as evidenced by diet recall.  Pt verbalizes comprehension and desired compliance with next diet stage.  Pt was provided w/instructions detailing diet advancement.  Re-reviewed vitamin/mineral supplements and provided w/reprint of her  vitamin instructions from pre-op visit.    P:  1.  Advance diet to full liquids on 05/11/23 and then to soft/mushy diet on 05/18/23.  Diet guidelines reviewed and pt verbalized understanding and desired compliance.  2.  Start vitamin/mineral supplementation with full liquid diet.   3. F/u in 6 weeks for diet advance to solids.      Spent a total of 15 minutes educating pt in an individual one-on-one setting.

## 2023-05-10 ENCOUNTER — Encounter (INDEPENDENT_AMBULATORY_CARE_PROVIDER_SITE_OTHER): Payer: Self-pay | Admitting: Family

## 2023-06-20 ENCOUNTER — Other Ambulatory Visit: Payer: Medicare Other

## 2023-06-20 DIAGNOSIS — Z9884 Bariatric surgery status: Secondary | ICD-10-CM

## 2023-06-20 DIAGNOSIS — Z713 Dietary counseling and surveillance: Secondary | ICD-10-CM

## 2023-06-20 DIAGNOSIS — E43 Unspecified severe protein-calorie malnutrition: Secondary | ICD-10-CM

## 2023-06-20 DIAGNOSIS — Z1321 Encounter for screening for nutritional disorder: Secondary | ICD-10-CM

## 2023-06-20 LAB — FOLATE
Folate: 15.7 ng/mL (ref >5.4)
Hemolysis Index: 0 {index}

## 2023-06-20 LAB — COMPREHENSIVE METABOLIC PANEL
ALT: 21 U/L (ref <=55)
AST (SGOT): 20 U/L (ref <=41)
Albumin/Globulin Ratio: 1.5 (ref 0.9–2.2)
Albumin: 3.8 g/dL (ref 3.5–5.0)
Alkaline Phosphatase: 49 U/L (ref 37–117)
Anion Gap: 8 (ref 5.0–15.0)
BUN: 20 mg/dL (ref 7–21)
Bilirubin, Total: 0.3 mg/dL (ref 0.2–1.2)
CO2: 30 meq/L — ABNORMAL HIGH (ref 17–29)
Calcium: 9.6 mg/dL (ref 8.5–10.5)
Chloride: 106 meq/L (ref 99–111)
Creatinine: 0.8 mg/dL (ref 0.4–1.0)
GFR: >60 mL/min/{1.73_m2} (ref >=60.0)
Globulin: 2.6 g/dL (ref 2.0–3.6)
Glucose: 107 mg/dL — ABNORMAL HIGH (ref 70–100)
Hemolysis Index: 1 {index}
Potassium: 3.9 meq/L (ref 3.5–5.3)
Protein, Total: 6.4 g/dL (ref 6.0–8.3)
Sodium: 144 meq/L (ref 135–145)

## 2023-06-20 LAB — LIPID PANEL
Cholesterol / HDL Ratio: 3.9 {index}
Cholesterol: 144 mg/dL (ref <=199)
HDL: 37 mg/dL — ABNORMAL LOW (ref >=40)
LDL Calculated: 67 mg/dL (ref 0–99)
Triglycerides: 200 mg/dL — ABNORMAL HIGH (ref 34–149)
VLDL Calculated: 40 mg/dL (ref 10–40)

## 2023-06-20 LAB — CBC
Absolute nRBC: 0 10*3/uL (ref <=0.00)
Hematocrit: 39.5 % (ref 34.7–43.7)
Hemoglobin: 13 g/dL (ref 11.4–14.8)
MCH: 30.5 pg (ref 25.1–33.5)
MCHC: 32.9 g/dL (ref 31.5–35.8)
MCV: 92.7 fL (ref 78.0–96.0)
MPV: 12 fL (ref 8.9–12.5)
Platelet Count: 326 10*3/uL (ref 142–346)
RBC: 4.26 10*6/uL (ref 3.90–5.10)
RDW: 13 % (ref 11–15)
WBC: 6.56 10*3/uL (ref 3.10–9.50)
nRBC %: 0 /100{WBCs} (ref <=0.0)

## 2023-06-20 LAB — VITAMIN B12: Vitamin B-12: >2000 pg/mL — ABNORMAL HIGH (ref 211–911)

## 2023-06-20 LAB — IRON
Hemolysis Index: 1 {index}
Iron: 78 ug/dL (ref 32–157)

## 2023-06-20 LAB — VITAMIN D, 25 OH, TOTAL: Vitamin D 25-OH, Total: 77 ng/mL (ref 30–100)

## 2023-06-21 ENCOUNTER — Other Ambulatory Visit (INDEPENDENT_AMBULATORY_CARE_PROVIDER_SITE_OTHER): Payer: Self-pay | Admitting: Internal Medicine

## 2023-06-21 LAB — COPPER: Copper: 127 ug/dL (ref 70–175)

## 2023-06-21 LAB — ZINC: Zinc: 77 ug/dL (ref 60–130)

## 2023-06-21 LAB — VITAMIN A: Vitamin A: 54 ug/dL (ref 38–98)

## 2023-06-25 LAB — WHOLE BLOOD VITAMIN B1 (THIAMINE): Vitamin B1, Whole Blood: 151 nmol/L (ref 78–185)

## 2023-07-03 ENCOUNTER — Ambulatory Visit (INDEPENDENT_AMBULATORY_CARE_PROVIDER_SITE_OTHER): Payer: Medicare Other | Admitting: Registered"

## 2023-07-03 DIAGNOSIS — Z9884 Bariatric surgery status: Secondary | ICD-10-CM

## 2023-07-03 DIAGNOSIS — Z713 Dietary counseling and surveillance: Secondary | ICD-10-CM

## 2023-07-03 NOTE — Progress Notes (Signed)
 S:  Pt presents for f/u visit after SG surgery.  Has been on soft/mushy phase since 05/18/23, and tolerating well.    Pt reports taking the following vitamin/mineral supplements: reports taking all supplements. She did accidentally take vitamins day before her labs.    Pt also reports consuming 60 grams protein daily from a supplemental source of Ensure Max.    Pt reports being able to consume about 64 oz clear fluids daily.  Pt states they have begun compliance with the 30/30 rule during meal times.      Pt reports current portion sizes are:  eyeballing, points to 1/2c measure as her estimate for portions.      GI: increased nausea in evenings the last few weeks. She's not sure if r/t personal stressors and/or eating too fast.    Diet Recall:     Breakfast: oatmeal w/small amount of 1% milk.   Lunch: 2pm tuna or chicken salad, some applesauce   Dinner: 7/8pm protein and vegetables. Last night had oatmeal as she didn't like what family was having. She may have a turkey burger w/applesauce.   Snacks: 11am protein shake and second at 3pm. Denies other snacking.   Beverages: water , decaf iced tea    Pt states currently participating in the following activities for exercise:  she has been falling a lot lately. She thinks it r/t one or more of her maintenance medications. Has had a hard time getting exercise in due to this. She has an exercise room with a treadmill, but she fell off of it yesterday.  Daughter was with her.  She plans to contact her neurologist to review a couple of her medications.    O:  Today 's Wt:  226lb 12.8oz   Previous Wt:   Wt Readings from Last 10 Encounters:   05/08/23 235 lb   04/26/23 242 lb 12.8 oz   04/10/23 250 lb   04/05/23 245 lb   03/04/23 245 lb   02/06/23 245 lb   02/01/23 245 lb   01/15/23 245 lb   01/02/23 246 lb   12/26/22 248 lb      Total Wt Loss: 16lb BMI:  There is no height or weight on file to calculate BMI.  Allergy:  Allergies[1]        A:  Pt current wt loss is less than  expected. Likely r/t larger than recommended portions and reduced activity due to concerns w/falls.  Food choices are appropriate - high in lean protein and produce.  Pt is drinking adequate amounts of clear fluids daily.  Pt verbalizes comprehension and desired compliance with solid diet phase. Agree w/pt she should f/u with her neurologist re: her frequent falls for further guidance.    Pt provided w/educational handout detailing diet instruction.    Has the patient been admitted to a hospital other than [facility] within the first 30 days following surgery?  no  Has the patient had a re-operation or intervention performed by a health care provider other than their surgeon within the first 30 days following surgery?   no  Has the patient been treated by a health care provider other than their surgeon for any complication within the first 30 days following surgery? (including ED visits)  no  P:  Reduce portions to 2oz per meal, increase to 3oz at 3 mo post op.  1.  Pt to advance diet, as tolerated, to solids.  Reviewed solid diet guidelines and meal/food options reviewed.  Pt also encouraged to  continue measuring all portions.  2.  Advance activity as tolerated.  3.  Pt to f/u in 4 months or PRN.  Contact information provided for pt in case of questions or concerns.    Spent a total of 15 minutes educating pt in an individual one-on-one setting.  Plan reviewed with surgeon.         [1]   Allergies  Allergen Reactions    Gabapentin       weezing

## 2023-07-08 ENCOUNTER — Ambulatory Visit (INDEPENDENT_AMBULATORY_CARE_PROVIDER_SITE_OTHER): Payer: Medicare Other | Admitting: Family

## 2023-07-08 ENCOUNTER — Encounter (INDEPENDENT_AMBULATORY_CARE_PROVIDER_SITE_OTHER): Payer: Self-pay

## 2023-07-08 VITALS — BP 100/63 | HR 71 | Temp 98.2°F | Resp 18

## 2023-07-08 DIAGNOSIS — J111 Influenza due to unidentified influenza virus with other respiratory manifestations: Secondary | ICD-10-CM

## 2023-07-08 DIAGNOSIS — R051 Acute cough: Secondary | ICD-10-CM

## 2023-07-08 LAB — POC COVID QUICKVUE ANTIGEN: QuickVue SARS COV2 Antigen POCT: NEGATIVE

## 2023-07-08 LAB — STREP A POCT GOHEALTH: Rapid Strep A Screen POCT: NEGATIVE

## 2023-07-08 LAB — POCT INFLUENZA A/B
POCT Rapid Influenza A AG: POSITIVE — AB
POCT Rapid Influenza B AG: NEGATIVE

## 2023-07-08 MED ORDER — OSELTAMIVIR PHOSPHATE 75 MG PO CAPS
75.0000 mg | ORAL_CAPSULE | Freq: Two times a day (BID) | ORAL | 0 refills | Status: AC
Start: 2023-07-08 — End: 2023-07-13

## 2023-07-08 NOTE — Progress Notes (Signed)
 Patient: Jennifer [name]   Date: 07/08/2023   MRN: [id number]           Subjective     Chief Complaint   Patient presents with    Sore Throat     Started 4 days ago as body aches cough head ache and fever with sinus pain.          Sore Throat       Jerrell is a 68 y.o. female who presents today  with complaint of body ache, headache, fever, sore throat, runny nose since Saturday.  Per patient husband was positive on Friday.  Patient denies chest pain and shortness of breath.  Per patient she took Alka-Seltzer.      History:  Pertinent Past Medical, Surgical, Family and Social History were reviewed.  Current Medications[1]  Allergies[2]  Medications and Allergies reviewed.         Objective   Vitals:    07/08/23 0924   BP: 100/63   BP Site: Right arm   Patient Position: Sitting   Cuff Size: Medium   Pulse: 71   Resp: 18   Temp: 98.2 F (36.8 C)   TempSrc: Tympanic   SpO2: 97%     There is no height or weight on file to calculate BMI.    Physical Exam  Constitutional:       General: She is not in acute distress.     Appearance: Normal appearance. She is not ill-appearing.   HENT:      Right Ear: Tympanic membrane, ear canal and external ear normal. There is no impacted cerumen.      Left Ear: Tympanic membrane, ear canal and external ear normal. There is no impacted cerumen.      Mouth/Throat:      Pharynx: No oropharyngeal exudate or posterior oropharyngeal erythema.   Eyes:      General: No scleral icterus.        Right eye: No discharge.         Left eye: No discharge.      Extraocular Movements: Extraocular movements intact.      Conjunctiva/sclera: Conjunctivae normal.      Pupils: Pupils are equal, round, and reactive to light.   Cardiovascular:      Rate and Rhythm: Normal rate and regular rhythm.      Pulses: Normal pulses.   Pulmonary:      Effort: No respiratory distress.      Breath sounds: No stridor. No wheezing, rhonchi or rales.   Musculoskeletal:      Cervical back: Normal range of motion and  neck supple. No tenderness.   Neurological:      Mental Status: She is alert and oriented to person, place, and time.   Psychiatric:         Mood and Affect: Mood normal.         Behavior: Behavior normal.              UCC Course   LABS  The following POCT tests were ordered, reviewed and discussed with the patient/family.     Results       Procedure Component Value Units Date/Time    Rapid Strep A POCT [[id number]]  (Normal) Collected: 07/08/23 0922    Specimen: Throat Updated: 07/08/23 0922     POCT QC Pass     Rapid Strep A Screen POCT Negative    QuickVue SARS-COV-2 Antigen POCT [[id number]]  (Normal) Collected: 07/08/23 9077  Specimen: Nasal Swab COVID-19 from Nares Updated: 07/08/23 0922     QuickVue SARS COV2 Antigen POCT Negative    POCT Influenza A/B [[id number]]  (Abnormal) Collected: 07/08/23 9077     Updated: 07/08/23 0922     POCT QC Pass     POCT Rapid Influenza A AG Positive     POCT Rapid Influenza B AG Negative          There were no x-rays reviewed with this patient during the visit.  Current Inpatient Medications with Last Dose Taken[3]       Procedures   Procedures      MDM/Assessment    History, physical, labs/studies most consistent with flu as the diagnosis.     Differential Diagnosis including but not limited to: Sinusitis, Bronchitis, Pharyngitis, Pneumonia, Influenza, COVID-19 and Allergic Rhinitis   Encounter Diagnoses   Name Primary?    Acute cough     Flu Yes                                                                                                                                                                     Plan  Positive for flu.  Take medication as this prescribed.  Take Tylenol  or Motrin alternatively to control the symptoms.  Increase fluid intake.  Rest.  If symptoms worsening seek medical attention immediately.  Follow-up with the primary care/urgent care if symptoms does not get better in 3 days.  Orders Placed This Encounter   Procedures    QuickVue SARS-COV-2  Antigen POCT    POCT Influenza A/B    Rapid Strep A POCT     Requested Prescriptions     Signed Prescriptions Disp Refills    oseltamivir  (TAMIFLU ) 75 MG capsule 10 capsule 0     Sig: Take 1 capsule (75 mg) by mouth 2 (two) times daily for 5 days       Discussed results and diagnosis with patient/family.  Reviewed warning signs for worsening condition, as well as, indications for follow-up with primary care physician and return to urgent care clinic.   Patient/family expressed understanding of instructions.     An After Visit Summary with pertinent information was made available to patient/family via MyChart or in-print.       [1]   Current Outpatient Medications:     acetaminophen  (TYLENOL ) 160 MG/5ML solution, Take 15.6 mLs (500 mg) by mouth every 4 (four) hours as needed for Fever or Pain, Disp: 473 mL, Rfl: 0    clonazePAM  (KlonoPIN ) 1 MG tablet, Take 1 tablet (1 mg) by mouth 3 (three) times daily as needed, Disp: , Rfl:     gabapentin  (NEURONTIN ) 300 MG capsule, TAKE 1 CAPSULE (300 MG) BY MOUTH 3 (THREE) TIMES DAILY FOR 3  DAYS, Disp: 9 capsule, Rfl: 0    Keppra  XR 750 MG extended release 24 hr tablet, Takes 1,500 mg po q AM and 750 mg po q PM, Disp: , Rfl:     Lacosamide  (Vimpat ) 200 MG Tab, Take by mouth 2 (two) times daily, Disp: , Rfl:     lansoprazole  (PREVACID ) 30 MG capsule, TAKE 1 CAPSULE (30 MG) BY MOUTH DAILY, Disp: 180 capsule, Rfl: 0    levothyroxine  (SYNTHROID ) 100 MCG tablet, Take 1 tablet (100 mcg) by mouth Once each week on Sunday morning, Disp: 90 tablet, Rfl: 0    ondansetron  (ZOFRAN -ODT) 4 MG disintegrating tablet, TAKE 1 TABLET (4 MG) BY MOUTH EVERY 8 (EIGHT) HOURS AS NEEDED FOR NAUSEA, Disp: 20 tablet, Rfl: 0    rosuvastatin  (CRESTOR ) 5 MG tablet, Take 1 tablet (5 mg) by mouth nightly, Disp: 90 tablet, Rfl: 0    acetaminophen  (TYLENOL ) 160 MG/5ML liquid, TAKE 15.6 MLS (500 MG) BY MOUTH EVERY 4 (FOUR) HOURS AS NEEDED FOR FEVER OR PAIN (Patient not taking: Reported on 07/08/2023), Disp: 473 mL,  Rfl: 0    enoxaparin  (LOVENOX ) 40 MG/0.4ML syringe, INJECT 0.4 MLS (40 MG) INTO THE SKIN 2 (TWO) TIMES DAILY FOR 14 DAYS, Disp: 11.2 mL, Rfl: 0    metoprolol  succinate XL (TOPROL -XL) 25 MG 24 hr tablet, Take 1 tablet (25 mg) by mouth daily, Disp: 100 tablet, Rfl: 0    metoprolol  tartrate (LOPRESSOR ) 25 MG tablet, TAKE 0.5 TABLETS (12.5 MG) BY MOUTH EVERY 12 (TWELVE) HOURS FOR 14 DAYS, Disp: 14 tablet, Rfl: 0    oseltamivir  (TAMIFLU ) 75 MG capsule, Take 1 capsule (75 mg) by mouth 2 (two) times daily for 5 days, Disp: 10 capsule, Rfl: 0    oxyCODONE  (ROXICODONE ) 5 MG/5ML solution, TAKE 5 MLS (5 MG) BY MOUTH EVERY 6 (SIX) HOURS AS NEEDED FOR PAIN (MODERATE TO SEVERE PAIN), Disp: 75 mL, Rfl: 0  [2]   Allergies  Allergen Reactions    Gabapentin       weezing   [3]   No current facility-administered medications for this visit.

## 2023-07-08 NOTE — Patient Instructions (Signed)
.  Positive for flu.  Take medication as this prescribed.  Take Tylenol  or Motrin alternatively to control the symptoms.  Increase fluid intake.  Rest.  If symptoms worsening seek medical attention immediately.  Follow-up with the primary care/urgent care if symptoms does not get better in 3 days.

## 2023-07-09 ENCOUNTER — Ambulatory Visit (INDEPENDENT_AMBULATORY_CARE_PROVIDER_SITE_OTHER): Payer: Medicare Other | Admitting: Internal Medicine

## 2023-07-11 ENCOUNTER — Ambulatory Visit (INDEPENDENT_AMBULATORY_CARE_PROVIDER_SITE_OTHER): Payer: Medicare Other | Admitting: Internal Medicine

## 2023-07-15 ENCOUNTER — Other Ambulatory Visit (INDEPENDENT_AMBULATORY_CARE_PROVIDER_SITE_OTHER): Payer: Self-pay | Admitting: Internal Medicine

## 2023-07-22 ENCOUNTER — Other Ambulatory Visit (INDEPENDENT_AMBULATORY_CARE_PROVIDER_SITE_OTHER): Payer: Self-pay | Admitting: Internal Medicine

## 2023-07-22 ENCOUNTER — Encounter (INDEPENDENT_AMBULATORY_CARE_PROVIDER_SITE_OTHER): Payer: Self-pay

## 2023-07-22 ENCOUNTER — Other Ambulatory Visit (INDEPENDENT_AMBULATORY_CARE_PROVIDER_SITE_OTHER): Payer: Self-pay | Admitting: Family

## 2023-07-22 MED ORDER — LANSOPRAZOLE 30 MG PO CPDR
DELAYED_RELEASE_CAPSULE | ORAL | 0 refills | Status: DC
Start: 2023-07-22 — End: 2024-03-12

## 2023-07-22 MED ORDER — ONDANSETRON 4 MG PO TBDP
ORAL_TABLET | ORAL | 0 refills | Status: DC
Start: 2023-07-22 — End: 2024-02-19

## 2023-07-22 NOTE — Telephone Encounter (Signed)
 Last filled December 2024.   Last o/v February 2025.  Patient has an upcoming appointment on Oct 09 2023.  Queued up 90 with 0 refills.

## 2023-08-05 ENCOUNTER — Ambulatory Visit (INDEPENDENT_AMBULATORY_CARE_PROVIDER_SITE_OTHER): Payer: Medicare Other | Admitting: Family

## 2023-08-05 ENCOUNTER — Ambulatory Visit (INDEPENDENT_AMBULATORY_CARE_PROVIDER_SITE_OTHER): Payer: Medicare Other | Admitting: Internal Medicine

## 2023-08-05 ENCOUNTER — Encounter (INDEPENDENT_AMBULATORY_CARE_PROVIDER_SITE_OTHER): Payer: Self-pay | Admitting: Internal Medicine

## 2023-08-05 VITALS — BP 112/79 | HR 54 | Temp 97.3°F | Wt 221.0 lb

## 2023-08-05 DIAGNOSIS — E66813 Obesity, class 3: Secondary | ICD-10-CM

## 2023-08-05 DIAGNOSIS — E039 Hypothyroidism, unspecified: Secondary | ICD-10-CM

## 2023-08-05 DIAGNOSIS — E785 Hyperlipidemia, unspecified: Secondary | ICD-10-CM

## 2023-08-05 DIAGNOSIS — I10 Essential (primary) hypertension: Secondary | ICD-10-CM

## 2023-08-05 DIAGNOSIS — Z6841 Body Mass Index (BMI) 40.0 and over, adult: Secondary | ICD-10-CM

## 2023-08-05 DIAGNOSIS — G40909 Epilepsy, unspecified, not intractable, without status epilepticus: Secondary | ICD-10-CM

## 2023-08-05 DIAGNOSIS — R7309 Other abnormal glucose: Secondary | ICD-10-CM

## 2023-08-05 LAB — BASIC METABOLIC PANEL
Anion Gap: 12 (ref 5.0–15.0)
BUN: 17 mg/dL (ref 7–21)
CO2: 27 meq/L (ref 17–29)
Calcium: 9.7 mg/dL (ref 8.5–10.5)
Chloride: 104 meq/L (ref 99–111)
Creatinine: 0.8 mg/dL (ref 0.4–1.0)
GFR: >60 mL/min/{1.73_m2} (ref >=60.0)
Glucose: 104 mg/dL — ABNORMAL HIGH (ref 70–100)
Hemolysis Index: 9 {index}
Potassium: 4.2 meq/L (ref 3.5–5.3)
Sodium: 143 meq/L (ref 135–145)

## 2023-08-05 LAB — T4, FREE: T4 Free: 1.08 ng/dL (ref 0.69–1.48)

## 2023-08-05 LAB — TSH: TSH: 1.65 u[IU]/mL (ref 0.35–4.94)

## 2023-08-05 LAB — HEMOGLOBIN A1C
Average Estimated Glucose: 114 mg/dL
Hemoglobin A1C: 5.6 % (ref 4.6–5.6)

## 2023-08-05 MED ORDER — LEVOTHYROXINE SODIUM 88 MCG PO TABS
88.0000 ug | ORAL_TABLET | ORAL | 1 refills | Status: DC
Start: 2023-08-05 — End: 2024-02-24

## 2023-08-05 NOTE — Progress Notes (Signed)
 Subjective:      Patient ID: Jennifer [name] is a 68 y.o. female     Chief Complaint   Patient presents with    Hypertension     Med follow up, pt is fasting     Seizures     Med f/u        HPI Pt is here for follow up following problems:     HTN, no home BP, compliant with meds,   No cp, no sob,     Seizure d/o, sees neuro,   Occ auras, takes clonazepam  bid,   Pt reported that she may have had a focal seizure yesterday.   Compliant with meds. Pt does not have clonazepam   Pt has contacted her neuro for this.     Hyperlipidemia, tolerating med, no myalgia   Lab Results   Component Value Date    CHOL 144 06/20/2023    CHOL 160 03/04/2023    CHOL 173 10/19/2022     Lab Results   Component Value Date    HDL 37 (L) 06/20/2023    HDL 36 (L) 03/04/2023    HDL 40 10/19/2022     Lab Results   Component Value Date    LDL 67 06/20/2023    LDL 68 03/04/2023    LDL 89 10/19/2022     Lab Results   Component Value Date    TRIG 200 (H) 06/20/2023    TRIG 281 (H) 03/04/2023    TRIG 219 (H) 10/19/2022      Hypothyroidism, weight down 40lbs post sleeve gastrectomy,     Lab Results   Component Value Date    TSH 4.07 03/04/2023    TSH 4.55 07/12/2022      Abnormal glucose, diet controlled.   Lab Results   Component Value Date    HGBA1C 5.9 (H) 03/04/2023      S/p sleeve gastrectomy, doing ok. Sees bariatrics.            The following sections were reviewed this encounter by the provider:   Allergies  Meds  Problems         Review of Systems   Constitutional:  Negative for appetite change and unexpected weight change.   Respiratory:  Negative for shortness of breath.    Cardiovascular:  Negative for chest pain and palpitations.   Gastrointestinal:  Negative for abdominal pain, nausea and vomiting.   Genitourinary:  Negative for dysuria.   Musculoskeletal:  Negative for myalgias.   Neurological:  Negative for syncope, light-headedness and headaches.          BP 112/79 (BP Site: Left arm, Patient Position: Sitting, Cuff Size: Large)    Pulse (!) 54   Temp 97.3 F (36.3 C) (Oral)   Wt 100.2 kg (221 lb)   BMI 40.42 kg/m     Objective:     Physical Exam  Vitals reviewed.   Constitutional:       General: She is not in acute distress.     Appearance: She is well-developed. She is obese.   Eyes:      General: No scleral icterus.     Conjunctiva/sclera: Conjunctivae normal.   Neck:      Thyroid : No thyromegaly.      Vascular: No carotid bruit or JVD.   Cardiovascular:      Rate and Rhythm: Normal rate and regular rhythm.      Heart sounds: Normal heart sounds. No murmur heard.     No  friction rub. No gallop.   Pulmonary:      Effort: Pulmonary effort is normal. No respiratory distress.      Breath sounds: Normal breath sounds. No rales.   Abdominal:      General: Bowel sounds are normal. There is no distension.      Palpations: Abdomen is soft.      Tenderness: There is no abdominal tenderness. There is no guarding or rebound.   Musculoskeletal:      Cervical back: Neck supple.      Right lower leg: No edema.      Left lower leg: No edema.   Neurological:      Mental Status: She is alert.          Assessment:     1. Hypothyroidism (acquired)  - TSH; Future  - T4, Free; Future  - levothyroxine  (SYNTHROID ) 88 MCG tablet; Take 1 tablet (88 mcg) by mouth Once every morning except Sunday  Dispense: 90 each; Refill: 1  - TSH  - T4, Free    2. Class 3 severe obesity with serious comorbidity and body mass index (BMI) of 40.0 to 44.9 in adult, unspecified obesity type    3. Hyperlipidemia LDL goal <100    4. Abnormal glucose  - Basic Metabolic Panel; Future  - Hemoglobin A1C; Future  - Basic Metabolic Panel  - Hemoglobin A1C    5. Seizure disorder (CMS/HCC)  - Levetiracetam  Level; Future  - Levetiracetam  Level    6. Hypertension, unspecified type        Plan:     Labs ordered  Continue meds  Rx refills.   Low carb, low cholesterol, low calorie diet, exercise, weight reduction  F/u with neuro   Monitor home BP  Further recommendations pending test  results.  F/u 6 months.    Dennison VEAR Blower, MD

## 2023-08-05 NOTE — Progress Notes (Signed)
 Have you seen any specialists/other providers since your last visit with [place]?    No    Health Maintenance Due   Topic Date Due    Advance Directive on File  Never done    Medicare Annual Wellness Visit  Never done

## 2023-08-06 ENCOUNTER — Encounter (INDEPENDENT_AMBULATORY_CARE_PROVIDER_SITE_OTHER): Payer: Self-pay | Admitting: Family

## 2023-08-06 ENCOUNTER — Ambulatory Visit (INDEPENDENT_AMBULATORY_CARE_PROVIDER_SITE_OTHER): Payer: Medicare Other | Admitting: Family

## 2023-08-06 VITALS — BP 102/55 | HR 58 | Ht 62.0 in | Wt 218.0 lb

## 2023-08-06 DIAGNOSIS — E43 Unspecified severe protein-calorie malnutrition: Secondary | ICD-10-CM

## 2023-08-06 DIAGNOSIS — Z713 Dietary counseling and surveillance: Secondary | ICD-10-CM

## 2023-08-06 DIAGNOSIS — Z1321 Encounter for screening for nutritional disorder: Secondary | ICD-10-CM

## 2023-08-06 DIAGNOSIS — Z9884 Bariatric surgery status: Secondary | ICD-10-CM

## 2023-08-06 NOTE — Progress Notes (Signed)
 Progress Note    Patient Name: Jennifer [name], Jennifer [name]  Age: 68 y.o.  Sex: female   DOB: Sep 12, 1955  MRN: [id number]    Subjective:   Jennifer [name] returns for follow up 3 months post Robotic Assisted Laparoscopic Sleeve Gastrectomy.   She has had 30 lbs total weight loss since day of surgery.     She is tolerating [name] regular diet.     She denies any nausea, vomiting, diarrhea, constipation, reflux, abdominal pain. Nausea at times but notices when eating too fast    Vitamins - MVI, B12, B50, Calcium , D.  Portion sizes - 3 oz portions three times per day.   Fluids - 64 oz of fluids per day.  Protein supplement - 2 protein shakes.  Physical activity: recovering from flu.  Looking to begin silver sneakers    She is very happy with her weight loss to date.       Past Medical History:     Past Medical History:   Diagnosis Date    Abnormal vision     glasses    Arrhythmia 2018    SVT (had ablation)    Arthritis     Complication of anesthesia     urinary retention with every joint replacement, BP drop with spinal X1, another time spinal didn't work    Convulsions (CMS/HCC)     epilepsy, last seizure 2010    Deep venous thrombosis of distal lower extremity (CMS/HCC) 2018    after cholecystectomy; left leg    Diverticulitis     takes miralax     Encounter for blood transfusion 2018    Gastric ulceration 2018    GI bleed with requiring 4 units PRBCs per pt    Hyperlipidemia     Hypothyroidism     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        Past Surgical History:   Past Surgical History[1]    Family History:   Family History[2]    Social History:     Social History     Socioeconomic History    Marital status: Married     Spouse name: None    Number of children: 3    Years of education: None    Highest education level: None   Occupational History    Occupation: retired [employer]   Tobacco Use    Smoking status: Never     Passive exposure: Past    Smokeless tobacco: Never   Vaping Use    Vaping status: Never Used    Substance and Sexual Activity    Alcohol use: Not Currently     Comment: rarely- holidays    Drug use: Not Currently    Sexual activity: Yes     Partners: Male     Birth control/protection: Post-menopausal   Other Topics Concern    Dietary supplements / vitamins Not Asked    Anesthesia problems Not Asked    Blood thinners Not Asked    Pregnant Not Asked    Future Children Not Asked    Number of Pregnancies? Not Asked    Number of children Not Asked    Miscarriages / Abortions? Not Asked    Eats large amounts No    Excessive Sweets Yes    Skips meals Yes    Eats excessive starches Yes    Snacks or grazes Yes    Emotional eater Yes    Eats fried food Yes    Eats fast  food Yes    Diet Center No    Randall Banks No    [facility] Weight Loss No    Nutri-System Yes    Opti-Fast / Medi-Fast No    Overeaters Anonymous No    [facility] Yes    TOPS No    Weight Watchers Yes    Atkins No    Binging / Purging No    Calorie Counting No    Fasting No    High Protein No    Low Carb Yes    Low Fat Yes    [facility] Diet No    Slim Fast No    [place] No    Stationary cycle or treadmill No    Gym/fitness Classes No    Home exercise/video No    Swimming No    Weight training No    Walking or running No    Hospitalization No    Hypnosis No    Physical therapy No    Psychological therapy No    Residential program No    Acutrim No    [place] No    Contrave No    Dexatrim No    Diethylpropion No    Fastin No    Fen - Phen No    Ionamin / Adipex No    Phentermine No    Qsymia No    Prozac No    Saxenda No    Topamax No    Wellbutrin No    Xenical (Orlistat, Alli) No    Other Med Yes     Comment: oxempic    No impairment No    Walks with cane/crutch No    Requires [name] wheelchair No    Bedridden No    Are you currently being treated for depression? No    Do you snore? Yes    Are you receiving any medical or psychological services? No    Do you ever wake up at night gasping for breath? Yes    Do you have or have you been treated  for an eating disorder? No    Anyone ever told you that you stop breathing while asleep? Yes    Do you exercise regularly? No    Have you or family member ever have trouble with anesthesia? No   Social History Narrative    None     Social Drivers of Health     Financial Resource Strain: Low Risk  (04/23/2023)    Overall Financial Resource Strain (CARDIA)     Difficulty of Paying Living Expenses: Not hard at all   Food Insecurity: No Food Insecurity (04/26/2023)    Hunger Vital Sign     Worried About Running Out of Food in the Last Year: Never true     Ran Out of Food in the Last Year: Never true   Transportation Needs: No Transportation Needs (04/26/2023)    PRAPARE - Therapist, Art (Medical): No     Lack of Transportation (Non-Medical): No   Physical Activity: Insufficiently Active (04/23/2023)    Exercise Vital Sign     Days of Exercise per Week: 2 days     Minutes of Exercise per Session: 50 min   Stress: No Stress Concern Present (04/23/2023)    [employer] of Occupational Health - Occupational Stress Questionnaire     Feeling of Stress : Not at all   Social Connections: Socially Integrated (04/23/2023)  Social Advertising Account Executive [NHANES]     Frequency of Communication with Friends and Family: More than three times [name] week     Frequency of Social Gatherings with Friends and Family: Twice [name] week     Attends Religious Services: 1 to 4 times per year     Active Member of [employer] or Organizations: No     Attends Engineer, Structural: 1 to 4 times per year     Marital Status: Married   Catering Manager Violence: Not At Risk (04/26/2023)    Humiliation, Afraid, Rape, and Kick questionnaire     Fear of Current or Ex-Partner: No     Emotionally Abused: No     Physically Abused: No     Sexually Abused: No   Housing Stability: Low Risk  (04/26/2023)    Housing Stability Vital Sign     Unable to Pay for Housing in the Last Year: No     Number of Times Moved in the Last  Year: 0     Homeless in the Last Year: No       Allergies:   Allergies[3]    Medications:     Prior to Admission medications    Medication Sig Start Date End Date Taking? Authorizing Provider   clonazePAM  (KlonoPIN ) 1 MG tablet Take 1 tablet (1 mg) by mouth 3 (three) times daily as needed    [provider]   gabapentin  (NEURONTIN ) 300 MG capsule TAKE 1 CAPSULE (300 MG) BY MOUTH 3 (THREE) TIMES DAILY FOR 3 DAYS 04/27/23 10/25/23  Linnell Donnice MATSU, DO   Keppra  XR 750 MG extended release 24 hr tablet Takes 1,500 mg po q AM and 750 mg po q PM 10/29/22   [provider]   Lacosamide  (Vimpat ) 200 MG Tab Take by mouth 2 (two) times daily    [provider]   lansoprazole  (PREVACID ) 30 MG capsule TAKE 1 CAPSULE (30 MG) BY MOUTH DAILY 07/22/23 07/20/24  Kenlei Safi, Fortunato PARAS, FNP   levothyroxine  (SYNTHROID ) 100 MCG tablet Take 1 tablet (100 mcg) by mouth Once each week on Sunday morning 03/03/23   Cecillia Tawni CROME, FNP   levothyroxine  (SYNTHROID ) 88 MCG tablet Take 1 tablet (88 mcg) by mouth Once every morning except Sunday 08/05/23   Sallyann Dennison DEL, MD   metoprolol  succinate XL (TOPROL -XL) 25 MG 24 hr tablet Take 1 tablet (25 mg) by mouth daily 06/21/23   Sallyann Dennison DEL, MD   ondansetron  (ZOFRAN -ODT) 4 MG disintegrating tablet TAKE 1 TABLET (4 MG) BY MOUTH EVERY 8 (EIGHT) HOURS AS NEEDED FOR NAUSEA 07/22/23 07/20/24  Cortland Crehan, Fortunato PARAS, FNP   rosuvastatin  (CRESTOR ) 5 MG tablet TAKE ONE TABLET BY MOUTH AT BEDTIME 07/22/23   Bui, Dennison DEL, MD   triamterene -hydrochlorothiazide  (DYAZIDE ) 37.5-25 MG per capsule TAKE ONE CAPSULE BY MOUTH EVERY MORNING. 07/15/23   Sallyann Dennison DEL, MD       Review of Systems:     Constitutional: Denies fevers/chills, unintentional weight loss  Head and neck: Negative for visual changes, eye pain, dry mouth, or hearing impairment  Respiratory: Negative for SOB, cough  Cardiovascular: Negative for chest pain, palpitations  Gastrointestinal: Negative for nausea, vomiting, diarrhea, constipation,  reflux, abdominal pain.  Genitourinary: Negative for hematuria, dysuria  Musculoskeletal: Negative for bone pain, myalgias and stiff joints; denies calf pain  Skin: Denies rash, itching, skin abnormalities   Neurological: Negative for dizziness, gait changes, headaches, or memory problems  Behavioral/Psych: Negative for fatigue with  loss of interest in favorite activities, sleep disturbance  Endocrine: Negative for temperature intolerance      Physical Exam:   BP 102/55   Pulse (!) 58   Ht 5' 2   Wt 218 lb   BMI 39.87 kg/m   BMI: Body mass index is 39.87 kg/m.  Previous Weight:   Wt Readings from Last 15 Encounters:   08/06/23 218 lb   08/05/23 221 lb   07/03/23 226 lb 12.8 oz   05/08/23 235 lb   04/26/23 242 lb 12.8 oz   04/10/23 250 lb   04/05/23 245 lb   03/04/23 245 lb   02/06/23 245 lb   02/01/23 245 lb   01/15/23 245 lb   01/02/23 246 lb   12/26/22 248 lb   12/17/22 245 lb 6.4 oz   10/15/22 241 lb        Appearance: Comfortable, no acute distress, well nourished  HEENT: Normocephalic, clear conjunctiva   Chest: Breathing unlabored  Neuro: [name]&Ox3  Psych: Calm, cooperative        Labs Reviewed:     Lab Results   Component Value Date    WBC 6.56 06/20/2023    HGB 13.0 06/20/2023    HCT 39.5 06/20/2023    MCV 92.7 06/20/2023    PLT 326 06/20/2023     '  Chemistry        Component Value Date/Time    NA 143 08/05/2023 1230    NA 142 10/19/2022 0821    K 4.2 08/05/2023 1230    K 4.2 10/19/2022 0821    CL 104 08/05/2023 1230    CL 105 10/19/2022 0821    CO2 27 08/05/2023 1230    CO2 26 10/19/2022 0821    BUN 17 08/05/2023 1230    BUN 18.0 10/19/2022 0821    CREAT 0.8 08/05/2023 1230    CREAT 0.8 10/19/2022 0821    GLU 104 (H) 08/05/2023 1230    GLU 116 (H) 10/19/2022 0821        Component Value Date/Time    CA 9.7 08/05/2023 1230    CA 9.6 10/19/2022 0821    ALKPHOS 49 06/20/2023 0855    ALKPHOS 47 10/19/2022 0821    AST 20 06/20/2023 0855    AST 13 10/19/2022 0821    ALT 21 06/20/2023 0855    ALT 16 10/19/2022  0821    BILITOTAL 0.3 06/20/2023 0855    BILITOTAL 0.3 10/19/2022 0821          Lab Results   Component Value Date    ALT 21 06/20/2023    AST 20 06/20/2023    ALKPHOS 49 06/20/2023    BILITOTAL 0.3 06/20/2023     Lab Results   Component Value Date    VITD 77 06/20/2023     Lab Results   Component Value Date    B12 >2,000 (H) 06/20/2023     No results found for: VITAMINB1  No results found for: VITAMINARETI  Cholesterol   Date Value Ref Range Status   06/20/2023 144 <=199 mg/dL Final   89/92/7975 839 <=199 mg/dL Final   94/75/7975 826 0 - 199 mg/dL Final   88/89/7976 814 0 - 199 mg/dL Final     Triglycerides   Date Value Ref Range Status   06/20/2023 200 (H) 34 - 149 mg/dL Final   89/92/7975 718 (H) 34 - 149 mg/dL Final   94/75/7975 780 (H) 34 - 149 mg/dL Final  04/06/2022 245 (H) 34 - 149 mg/dL Final     HDL   Date Value Ref Range Status   06/20/2023 37 (L) >=40 mg/dL Final     Comment:     An HDL Cholesterol 40mg /dL is low and constitutes [name] coronary heart disease risk factor, and an HDL Cholesterol >59mg /dL is [name] negative risk factor for CHD.     Reference: American Heart Association; Circulation 2004   03/04/2023 36 (L) >=40 mg/dL Final     Comment:     An HDL Cholesterol 40mg /dL is low and constitutes [name] coronary heart disease risk factor, and an HDL Cholesterol >59mg /dL is [name] negative risk factor for CHD.     Reference: American Heart Association; Circulation 2004   10/19/2022 40 40 - 9,999 mg/dL Final     Comment:     An HDL cholesterol <40 mg/dL is low and constitutes [name]  coronary heart disease risk factor, and HDL-C>59 mg/dL is  [name] negative risk factor for CHD.  Ref: American Heart Association; Circulation 2004     04/06/2022 44 40 - 9,999 mg/dL Final     Comment:     An HDL cholesterol <40 mg/dL is low and constitutes [name]  coronary heart disease risk factor, and HDL-C>59 mg/dL is  [name] negative risk factor for CHD.  Ref: American Heart Association; Circulation 2004       Cholesterol / HDL Ratio   Date Value  Ref Range Status   06/20/2023 3.9 Index Final     Comment:     Cholesterol/HDL Ratio:  Classification                   Female     Female  Very Low (1/2 Average Risk)      <3.4     <3.3  Low Risk                          4.0      3.8  Average Risk                      5.0      4.5  Moderate Risk(2x  Average Risk)   9.5      7.0  High Risk (3x Average Risk)      >23.0    >11.0     03/04/2023 4.4 Index Final     Comment:     Cholesterol/HDL Ratio:  Classification                   Female     Female  Very Low (1/2 Average Risk)      <3.4     <3.3  Low Risk                          4.0      3.8  Average Risk                      5.0      4.5  Moderate Risk(2x  Average Risk)   9.5      7.0  High Risk (3x Average Risk)      >23.0    >11.0     10/19/2022 4.3 See Below Index Final     Comment:     Chol/HDL Ratio:  Classification  Female     Female  Very Low (1/2 Average Risk)      <3.4     <3.3  Low Risk                         4.0      3.8  Average Risk                     5.0      4.5  Moderate Risk (2X Average risk)  9.5      7.0  High Risk (3X Average Risk)      >23.0    >11.0     04/06/2022 4.2 See Below Index Final     Comment:     Chol/HDL Ratio:  Classification                   Female     Female  Very Low (1/2 Average Risk)      <3.4     <3.3  Low Risk                         4.0      3.8  Average Risk                     5.0      4.5  Moderate Risk (2X Average risk)  9.5      7.0  High Risk (3X Average Risk)      >23.0    >11.0       Lab Results   Component Value Date    TSH 1.65 08/05/2023    TSH 4.55 07/12/2022     Lab Results   Component Value Date    CA 9.7 08/05/2023    PHOS 3.3 04/27/2023     Lab Results   Component Value Date    IRON 78 06/20/2023    TIBC 317 03/04/2023     Lab Results   Component Value Date    HGBA1C 5.6 08/05/2023       Rads:   Radiological Procedure reviewed.    Radiology Results (24 Hour)       ** No results found for the last 24 hours. **            Assessment and Plan:    Jennifer [name] is 3 months post Robotic Assisted Laparoscopic Sleeve Gastrectomy. She has had 30 lbs total weight loss since day of surgery.     Dietary compliance - maintain 3 oz portions three times per day and 2 protein shakes.    Problems addressed:  none    Labs reviewed.   Decrease B12 to three times per week   We will continue to monitor.     #. Portion control  #. Protein goal: 50-60 gm from supplement daily  #. Fluid goal: at least 64 oz daily  #. Activity as tolerated  #. F/u w/ PCP routinely  #. Problem list and obesity-related co-morbidities reviewed. Continue recommend bariatric surgery follow-up plan as noted to improve health outcomes and resolve obesity-related co-morbid conditions.  #. Routine care in 3 months with labs or sooner if needed.        Patient verbalized understanding and agrees with the plan.      Return in about 3 months (around 11/06/2023) for Fortunato Moody, NP w/labs.      Signed by: Fortunato  JINNY Moody, FNP         [1]   Past Surgical History:  Procedure Laterality Date    ADENOIDECTOMY      APPENDECTOMY (OPEN)  2009    ARTHROPLASTY, KNEE, TOTAL Left 02/12/2020    Procedure: LEFT ARTHROPLASTY, KNEE, TOTAL;  Surgeon: Enriqueta Debby LABOR, MD;  Location: [facility];  Service: Orthopedics;  Laterality: Left;    BACK SURGERY  1973    lumbar    CARDIAC ABLATION  2018    SVT    CHOLECYSTECTOMY  2018    COLON SURGERY  2008    resection, perforated colon with colonoscopy    COLONOSCOPY, DIAGNOSTIC (SCREENING)      EGD, BIOPSY N/[name] 02/06/2023    Procedure: ESOPHAGOGASTRODUODENOSCOPY (EGD), BIOPSY;  Surgeon: Rozanne Jody FERNS, MD;  Location: [facility];  Service: General;  Laterality: N/[name];    JOINT REPLACEMENT      bilat hips, L 2005, R 2010, right knee 2015    LAPAROSCOPIC, OMENTOPEXY N/[name] 04/26/2023    Procedure: ROBOT XI ASSISTED, LAPAROSCOPIC, OMENTOPEXY W/ GASTRIC RESTRICTIVE PROCEDURE;  Surgeon: Marge Charmaine CROME, MD;  Location: [facility];  Service: General;   Laterality: N/[name];    LYSIS OF ADHESIONS N/[name] 04/26/2023    Procedure: LYSIS OF ADHESIONS;  Surgeon: Marge Charmaine CROME, MD;  Location: [facility];  Service: General;  Laterality: N/[name];    OTHER SURGICAL HISTORY  2018    IVC filter    ROBOT XI ASSISTED,LAPAROSCOPIC,SLEEVE GASTRECTOMY N/[name] 04/26/2023    Procedure: ROBOT XI ASSISTED, LAPAROSCOPIC, SLEEVE GASTRECTOMY;  Surgeon: Marge Charmaine CROME, MD;  Location: [facility];  Service: General;  Laterality: N/[name];    SMALL INTESTINE SURGERY  2007    Colon resection post colonoscopy    SPINE SURGERY  11/26/1971    TONSILLECTOMY     [2]   Family History  Problem Relation Name Age of Onset    Cancer Mother Ronal Skeens         lung    Arthritis Mother Ronal Skeens     Heart disease Mother Ronal Skeens         [name] fib/ chf    Stroke Mother Ronal Skeens     Heart attack Father Bette Skeens 61    Cancer Son Donnice Ratel         Thyroid     Cancer Maternal Uncle Larnell Ponto    [3]   Allergies  Allergen Reactions    Gabapentin       weezing

## 2023-08-07 LAB — LEVETIRACETAM LEVEL: Levetiracetam (Keppra): 58.2 ug/mL — ABNORMAL HIGH (ref 6.0–46.0)

## 2023-08-07 NOTE — Progress Notes (Signed)
 Keppra  drug level is mildly high  Thyroid  level is normal  Glucose is borderline high, Hba1c is normal    Rec:  Rec:  1. Low carb, low cholesterol, low calorie diet, exercise, weight reduction 2. Follow up with neurologist for Keppra  dose adjustment    3. Keep follow up visit

## 2023-08-09 ENCOUNTER — Emergency Department
Admission: EM | Admit: 2023-08-09 | Discharge: 2023-08-09 | Disposition: A | Discharge status: Home / Self Care | Attending: Emergency Medicine | Admitting: Emergency Medicine

## 2023-08-09 ENCOUNTER — Emergency Department

## 2023-08-09 DIAGNOSIS — K5792 Diverticulitis of intestine, part unspecified, without perforation or abscess without bleeding: Secondary | ICD-10-CM

## 2023-08-09 LAB — COMPREHENSIVE METABOLIC PANEL
ALT: 15 U/L (ref <=55)
AST (SGOT): 16 U/L (ref <=41)
Albumin/Globulin Ratio: 1.3 (ref 0.9–2.2)
Albumin: 3.6 g/dL (ref 3.5–5.0)
Alkaline Phosphatase: 52 U/L (ref 37–117)
Anion Gap: 14 (ref 5.0–15.0)
BUN: 21 mg/dL (ref 7–21)
Bilirubin, Total: 0.4 mg/dL (ref 0.2–1.2)
CO2: 25 meq/L (ref 17–29)
Calcium: 9 mg/dL (ref 8.5–10.5)
Chloride: 104 meq/L (ref 99–111)
Creatinine: 0.7 mg/dL (ref 0.4–1.0)
GFR: >60 mL/min/{1.73_m2} (ref >=60.0)
Globulin: 2.8 g/dL (ref 2.0–3.6)
Glucose: 110 mg/dL — ABNORMAL HIGH (ref 70–100)
Potassium: 3.7 meq/L (ref 3.5–5.3)
Protein, Total: 6.4 g/dL (ref 6.0–8.3)
Sodium: 143 meq/L (ref 135–145)

## 2023-08-09 LAB — LAB USE ONLY - CBC WITH DIFFERENTIAL
Absolute Basophils: 0.03 10*3/uL (ref 0.00–0.08)
Absolute Eosinophils: 0.2 10*3/uL (ref 0.00–0.44)
Absolute Immature Granulocytes: 0.03 10*3/uL (ref 0.00–0.07)
Absolute Lymphocytes: 1.71 10*3/uL (ref 0.42–3.22)
Absolute Monocytes: 0.67 10*3/uL (ref 0.21–0.85)
Absolute Neutrophils: 7.99 10*3/uL — ABNORMAL HIGH (ref 1.10–6.33)
Absolute nRBC: 0 10*3/uL (ref <=0.00)
Basophils %: 0.3 %
Eosinophils %: 1.9 %
Hematocrit: 40.3 % (ref 34.7–43.7)
Hemoglobin: 13.5 g/dL (ref 11.4–14.8)
Immature Granulocytes %: 0.3 %
Lymphocytes %: 16.1 %
MCH: 31.4 pg (ref 25.1–33.5)
MCHC: 33.5 g/dL (ref 31.5–35.8)
MCV: 93.7 fL (ref 78.0–96.0)
MPV: 11.1 fL (ref 8.9–12.5)
Monocytes %: 6.3 %
Neutrophils %: 75.1 %
Platelet Count: 269 10*3/uL (ref 142–346)
Preliminary Absolute Neutrophil Count: 7.99 10*3/uL — ABNORMAL HIGH (ref 1.10–6.33)
RBC: 4.3 10*6/uL (ref 3.90–5.10)
RDW: 13 % (ref 11–15)
WBC: 10.63 10*3/uL — ABNORMAL HIGH (ref 3.10–9.50)
nRBC %: 0 /100{WBCs} (ref <=0.0)

## 2023-08-09 LAB — LIPASE: Lipase: 28 U/L (ref 8–78)

## 2023-08-09 MED ORDER — ONDANSETRON HCL 4 MG/2ML IJ SOLN
4.0000 mg | Freq: Once | INTRAMUSCULAR | Status: AC
Start: 2023-08-09 — End: 2023-08-09
  Administered 2023-08-09: 4 mg via INTRAVENOUS
  Filled 2023-08-09: qty 2

## 2023-08-09 MED ORDER — AMOXICILLIN-POT CLAVULANATE 875-125 MG PO TABS
1.0000 | ORAL_TABLET | Freq: Once | ORAL | Status: AC
Start: 2023-08-09 — End: 2023-08-09
  Administered 2023-08-09: 1 via ORAL
  Filled 2023-08-09: qty 1

## 2023-08-09 MED ORDER — IOHEXOL 350 MG/ML IV SOLN
100.0000 mL | Freq: Once | INTRAVENOUS | Status: AC | PRN
Start: 2023-08-09 — End: 2023-08-09
  Administered 2023-08-09: 100 mL via INTRAVENOUS

## 2023-08-09 MED ORDER — AMOXICILLIN-POT CLAVULANATE 875-125 MG PO TABS
1.0000 | ORAL_TABLET | Freq: Two times a day (BID) | ORAL | 0 refills | Status: AC
Start: 2023-08-09 — End: 2023-08-16

## 2023-08-09 MED ORDER — MORPHINE SULFATE 4 MG/ML IJ/IV SOLN (WRAP)
4.0000 mg | Freq: Once | Status: AC
Start: 2023-08-09 — End: 2023-08-09
  Administered 2023-08-09: 4 mg via INTRAVENOUS
  Filled 2023-08-09: qty 1

## 2023-08-09 NOTE — ED Provider Notes (Signed)
 EMERGENCY DEPARTMENT NOTE     Patient initially seen and examined at   ED PHYSICIAN ASSIGNED       Date/Time Event User Comments    08/09/23 2054 Physician Assigned GARON ROLANDO Garon Rolando, MD assigned as Attending           [facility] (APP) ASSIGNED       Date/Time Event User Comments    08/09/23 1859 PA/NP Provider Assigned VICCI SADDIE GORMAN Vicci Saddie GORMAN, NP assigned as Nurse Practitioner            HISTORY OF PRESENT ILLNESS   Translator Used : No    Chief Complaint: Abdominal Pain       History of Present Illness  Jennifer [name] is a 68 year old female with hx of  diverticulitis who presents with abdominal pain.    She has been experiencing cramping abdominal pain in the lower left quadrant since Wednesday night, which she attributes to her diverticulitis. She initially attempted to manage the symptoms with MiraLAX , which did not provide immediate relief but eventually led to bowel movements. Following the use of MiraLAX , she experienced liquid stool that appeared 'sandy'. Despite these efforts, the abdominal pain persisted. No blood in stools or fever has been noted.    She mentions a previous flare-up of diverticulitis, though it has been a while since the last occurrence. She has not had a CT scan in a while and has not needed one until now. She took two Tylenol  at 4 PM     She has a history of bariatric surgery performed the day after Thanksgiving, resulting in a 40-pound weight loss. She reports doing well post-surgery, with nutritional aspects .    Denies urinary symptoms    Independent Historian (other than patient): Spouse  Additional History Provided by Independent Historian:  MEDICAL HISTORY     Past Medical History:  Past Medical History:   Diagnosis Date    Abnormal vision     glasses    Arrhythmia 2018    SVT (had ablation)    Arthritis     Complication of anesthesia     urinary retention with every joint replacement, BP drop with spinal X1, another time spinal didn't work    Convulsions  (CMS/HCC)     epilepsy, last seizure 2010    Deep venous thrombosis of distal lower extremity (CMS/HCC) 2018    after cholecystectomy; left leg    Diverticulitis     takes miralax     Encounter for blood transfusion 2018    Gastric ulceration 2018    GI bleed with requiring 4 units PRBCs per pt    Hyperlipidemia     Hypothyroidism     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        Past Surgical History:  Past Surgical History[1]    Social History:  @SOC @    Family History:  Family History[2]    Outpatient Medication:  Discharge Medication List as of 08/09/2023  9:02 PM        CONTINUE these medications which have NOT CHANGED    Details   clonazePAM  (KlonoPIN ) 1 MG tablet Take 1 tablet (1 mg) by mouth 3 (three) times daily as needed, Historical Med      gabapentin  (NEURONTIN ) 300 MG capsule TAKE 1 CAPSULE (300 MG) BY MOUTH 3 (THREE) TIMES DAILY FOR 3 DAYS, Starting Sat 04/27/2023, Until Fri 10/25/2023 at 2359, Normal      Keppra   XR 750 MG extended release 24 hr tablet Takes 1,500 mg po q AM and 750 mg po q PM, Historical Med      Lacosamide  (Vimpat ) 200 MG Tab Take by mouth 2 (two) times daily, Historical Med      lansoprazole  (PREVACID ) 30 MG capsule TAKE 1 CAPSULE (30 MG) BY MOUTH DAILY, Starting Mon 07/22/2023, Until Mon 07/20/2024 at 2359, Normal      !! levothyroxine  (SYNTHROID ) 100 MCG tablet Take 1 tablet (100 mcg) by mouth Once each week on Sunday morning, Starting Sun 03/03/2023, E-Rx      !! levothyroxine  (SYNTHROID ) 88 MCG tablet Take 1 tablet (88 mcg) by mouth Once every morning except Sunday, Starting Mon 08/05/2023, E-Rx      metoprolol  succinate XL (TOPROL -XL) 25 MG 24 hr tablet Take 1 tablet (25 mg) by mouth daily, E-Rx      NON FORMULARY VITAMINS: MVI, calcium , b12, b50, vit D, Historical Med      ondansetron  (ZOFRAN -ODT) 4 MG disintegrating tablet TAKE 1 TABLET (4 MG) BY MOUTH EVERY 8 (EIGHT) HOURS AS NEEDED FOR NAUSEA, Starting Mon 07/22/2023, Until Mon 07/20/2024 at 2359, Normal       rosuvastatin  (CRESTOR ) 5 MG tablet TAKE ONE TABLET BY MOUTH AT BEDTIME, Starting Mon 07/22/2023, E-Rx      triamterene -hydrochlorothiazide  (DYAZIDE ) 37.5-25 MG per capsule TAKE ONE CAPSULE BY MOUTH EVERY MORNING., Starting Mon 07/15/2023, E-Rx       !! - Potential duplicate medications found. Please discuss with provider.            REVIEW OF SYSTEMS   Review of Systems See History of Present Illness  PHYSICAL EXAM     ED Triage Vitals [08/09/23 1851]   Encounter Vitals Group      BP 126/78      Systolic BP Percentile       Diastolic BP Percentile       Heart Rate 74      Resp Rate 16      Temp 98.1 F (36.7 C)      Temp src Oral      SpO2 99 %      Weight 98.9 kg      Height 1.575 m      Head Circumference       Peak Flow       Pain Score 9      Pain Loc       Pain Education       Exclude from Growth Chart      Physical Exam  Constitutional:       Appearance: She is well-developed. She is ill-appearing.   Cardiovascular:      Rate and Rhythm: Normal rate.   Pulmonary:      Effort: Pulmonary effort is normal.   Abdominal:      General: Abdomen is flat.      Palpations: Abdomen is soft.      Tenderness: There is abdominal tenderness in the left lower quadrant.   Skin:     General: Skin is warm.      Coloration: Skin is pale.   Neurological:      General: No focal deficit present.      Mental Status: She is alert.   Psychiatric:         Mood and Affect: Mood normal.        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  Current guidelines recommend Augmentin  monotherapy. Discussed waiting additional 24-48 hrs to see if pt improved prior to starting abx. Pt deferred and wanted to start now.   - Prescribe Augmentin .  - Advise acetaminophen  for pain.    Discussed case with ED attending, Rahhal, who is agreeable to the plan of care, treatment and disposition.       If patient is being hospitalized is severe sepsis or septic shock suspected?: N/A                  External Records Reviewed?: N/A    Additional Notes                     Vital Signs: Reviewed the patient's vital signs.   Nursing Notes: Reviewed and utilized available nursing notes.  Medical Records Reviewed: Reviewed available past medical records.  Counseling: The emergency provider has spoken with the patient and discussed today 's findings, in addition to providing specific details for the plan of care.  Questions are answered and there is agreement with the plan.      CARDIAC STUDIES    The following cardiac studies were independently interpreted by me the Emergency Medicine Provider.  For full cardiac study results please see chart.    Monitor Strip interpreted by me (ED provider)  Rate: 60-100  Rhythm: Normal Sinus Rhythm  ST segments: No acute changes                                        RADIOLOGY IMAGING STUDIES      CT Abd/Pelvis with IV Contrast   Final Result    Mild pericolonic inflammatory stranding adjacent to the colon   near the junction of the descending and sigmoid colon consistent with   descending colon diverticulitis.      Toribio Hering, MD   08/09/2023 8:48 PM          EMERGENCY IMAGING STUDIES    The following imagine studies were independently interpreted by me (emergency medicine provider):                         EMERGENCY DEPT. MEDICATIONS      ED Medication Orders (From admission, onward)      Start Ordered     Status Ordering Provider    08/09/23 2103 08/09/23 2102   amoxicillin -clavulanate (AUGMENTIN ) 875-125 MG per tablet 1 tablet  Once        Route: Oral  Ordered Dose: 1 tablet       Last MAR action: Given VICCI SADDIE RAMAN    08/09/23 2027 08/09/23 2028  iohexol  (OMNIPAQUE ) 350 MG/ML injection 100 mL  IMG once as needed        Route: Intravenous  Ordered Dose: 100 mL       Last MAR action: Imaging Agent Given VICCI SADDIE RAMAN    08/09/23 1926 08/09/23 1925  morphine  injection 4 mg  Once        Route: Intravenous  Ordered Dose: 4 mg       Last MAR action: Given VICCI SADDIE RAMAN    08/09/23 1926 08/09/23 1925  ondansetron  (ZOFRAN ) injection 4 mg  Once        Route: Intravenous  Ordered Dose: 4 mg       Last MAR action: Given Tarvaris Puglia S            LABORATORY RESULTS    Ordered and independently interpreted AVAILABLE laboratory tests.   Results  LABS  WBC: 10.48 (08/09/2023)    RADIOLOGY  Abdominal CT: Diverticulitis (08/09/2023)     Results       Procedure Component Value Units Date/Time    Comprehensive Metabolic Panel [[id number]]  (Abnormal) Collected: 08/09/23 1908    Specimen: Blood, Venous Updated: 08/09/23 1931     Glucose 110 mg/dL      BUN 21 mg/dL      Creatinine 0.7 mg/dL      Sodium 856 mEq/L      Potassium 3.7 mEq/L      Chloride 104 mEq/L      CO2 25 mEq/L      Calcium  9.0 mg/dL      Anion Gap 85.9     GFR >60.0 mL/min/1.73 m2      AST (SGOT) 16 U/L      ALT 15 U/L      Alkaline Phosphatase 52 U/L      Albumin  3.6 g/dL      Protein, Total 6.4 g/dL      Globulin 2.8 g/dL      Albumin /Globulin Ratio 1.3     Bilirubin, Total 0.4 mg/dL     Lipase [[id number]]  (Normal) Collected: 08/09/23 1908    Specimen: Blood, Venous Updated: 08/09/23 1931     Lipase 28 U/L     CBC with Differential (Order) [[id number]]  (Abnormal) Collected: 08/09/23 1908    Specimen: Blood, Venous Updated: 08/09/23 1914    Narrative:      The following orders were created for panel order CBC with Differential (Order).  Procedure                               Abnormality         Status                      ---------                               -----------         ------                     CBC with Differential (...[[id number]]  Abnormal            Final result                 Please view results for these tests on the individual orders.    CBC with Differential (Component) [[id number]]  (Abnormal) Collected: 08/09/23 1908    Specimen: Blood, Venous Updated: 08/09/23 1914     WBC 10.63 x10 3/uL      Hemoglobin 13.5 g/dL      Hematocrit 59.6 %      Platelet Count 269 x10 3/uL      MPV 11.1 fL  RBC 4.30 x10 6/uL      MCV 93.7 fL      MCH 31.4 pg      MCHC 33.5 g/dL      RDW 13 %      nRBC % 0.0 /100 WBC      Absolute nRBC 0.00 x10 3/uL      Preliminary Absolute Neutrophil Count 7.99 x10 3/uL      Neutrophils % 75.1 %      Lymphocytes % 16.1 %      Monocytes % 6.3 %      Eosinophils % 1.9 %      Basophils % 0.3 %      Immature Granulocytes % 0.3 %      Absolute Neutrophils 7.99 x10 3/uL      Absolute Lymphocytes 1.71 x10 3/uL      Absolute Monocytes 0.67 x10 3/uL      Absolute Eosinophils 0.20 x10 3/uL      Absolute Basophils 0.03 x10 3/uL      Absolute Immature Granulocytes 0.03 x10 3/uL               CRITICAL CARE/PROCEDURES    Procedures    DIAGNOSIS      Diagnosis:  Final diagnoses:   Diverticulitis       Disposition:  ED Disposition       ED Disposition   Discharge    Condition   --    Date/Time   Fri Aug 09, 2023  9:02 PM    Comment   Jennifer [name] discharge to home/self care.    Condition at disposition: Stable                 Prescriptions:  Discharge Medication List as of 08/09/2023  9:02 PM        START taking these medications    Details   amoxicillin -clavulanate (AUGMENTIN ) 875-125 MG per tablet Take 1 tablet by mouth 2 (two) times daily for 7 days, Starting Fri 08/09/2023, Until Fri 08/16/2023, E-Rx           CONTINUE these medications which have NOT CHANGED    Details   clonazePAM  (KlonoPIN ) 1 MG tablet Take 1 tablet (1 mg) by mouth 3 (three) times daily as needed, Historical Med       gabapentin  (NEURONTIN ) 300 MG capsule TAKE 1 CAPSULE (300 MG) BY MOUTH 3 (THREE) TIMES DAILY FOR 3 DAYS, Starting Sat 04/27/2023, Until Fri 10/25/2023 at 2359, Normal      Keppra  XR 750 MG extended release 24 hr tablet Takes 1,500 mg po q AM and 750 mg po q PM, Historical Med      Lacosamide  (Vimpat ) 200 MG Tab Take by mouth 2 (two) times daily, Historical Med      lansoprazole  (PREVACID ) 30 MG capsule TAKE 1 CAPSULE (30 MG) BY MOUTH DAILY, Starting Mon 07/22/2023, Until Mon 07/20/2024 at 2359, Normal      !! levothyroxine  (SYNTHROID ) 100 MCG tablet Take 1 tablet (100 mcg) by mouth Once each week on Sunday morning, Starting Sun 03/03/2023, E-Rx      !! levothyroxine  (SYNTHROID ) 88 MCG tablet Take 1 tablet (88 mcg) by mouth Once every morning except Sunday, Starting Mon 08/05/2023, E-Rx      metoprolol  succinate XL (TOPROL -XL) 25 MG 24 hr tablet Take 1 tablet (25 mg) by mouth daily, E-Rx      NON FORMULARY VITAMINS: MVI, calcium , b12, b50, vit D, Historical Med  ondansetron  (ZOFRAN -ODT) 4 MG disintegrating tablet TAKE 1 TABLET (4 MG) BY MOUTH EVERY 8 (EIGHT) HOURS AS NEEDED FOR NAUSEA, Starting Mon 07/22/2023, Until Mon 07/20/2024 at 2359, Normal      rosuvastatin  (CRESTOR ) 5 MG tablet TAKE ONE TABLET BY MOUTH AT BEDTIME, Starting Mon 07/22/2023, E-Rx      triamterene -hydrochlorothiazide  (DYAZIDE ) 37.5-25 MG per capsule TAKE ONE CAPSULE BY MOUTH EVERY MORNING., Starting Mon 07/15/2023, E-Rx       !! - Potential duplicate medications found. Please discuss with provider.              This note was generated by the Epic EMR system/ Dragon speech recognition and may contain inherent errors or omissions not intended by the user. Grammatical errors, random word insertions, deletions and pronoun errors  are occasional consequences of this technology due to software limitations. Not all errors are caught or corrected. If there are questions or concerns about the content of this note or information contained within the body of  this dictation they should be addressed directly with the author for clarification.           [1]   Past Surgical History:  Procedure Laterality Date    ADENOIDECTOMY      APPENDECTOMY (OPEN)  2009    ARTHROPLASTY, KNEE, TOTAL Left 02/12/2020    Procedure: LEFT ARTHROPLASTY, KNEE, TOTAL;  Surgeon: Enriqueta Debby LABOR, MD;  Location: [facility];  Service: Orthopedics;  Laterality: Left;    BACK SURGERY  1973    lumbar    CARDIAC ABLATION  2018    SVT    CHOLECYSTECTOMY  2018    COLON SURGERY  2008    resection, perforated colon with colonoscopy    COLONOSCOPY, DIAGNOSTIC (SCREENING)      EGD, BIOPSY N/A 02/06/2023    Procedure: ESOPHAGOGASTRODUODENOSCOPY (EGD), BIOPSY;  Surgeon: Rozanne Jody FERNS, MD;  Location: [facility];  Service: General;  Laterality: N/A;    JOINT REPLACEMENT      bilat hips, L 2005, R 2010, right knee 2015    LAPAROSCOPIC, OMENTOPEXY N/A 04/26/2023    Procedure: ROBOT XI ASSISTED, LAPAROSCOPIC, OMENTOPEXY W/ GASTRIC RESTRICTIVE PROCEDURE;  Surgeon: Marge Charmaine CROME, MD;  Location: [facility];  Service: General;  Laterality: N/A;    LYSIS OF ADHESIONS N/A 04/26/2023    Procedure: LYSIS OF ADHESIONS;  Surgeon: Marge Charmaine CROME, MD;  Location: [facility];  Service: General;  Laterality: N/A;    OTHER SURGICAL HISTORY  2018    IVC filter    ROBOT XI ASSISTED,LAPAROSCOPIC,SLEEVE GASTRECTOMY N/A 04/26/2023    Procedure: ROBOT XI ASSISTED, LAPAROSCOPIC, SLEEVE GASTRECTOMY;  Surgeon: Marge Charmaine CROME, MD;  Location: [facility];  Service: General;  Laterality: N/A;    SMALL INTESTINE SURGERY  2007    Colon resection post colonoscopy    SPINE SURGERY  11/26/1971    TONSILLECTOMY     [2]   Family History  Problem Relation Name Age of Onset    Cancer Mother Ronal Skeens         lung    Arthritis Mother Ronal Skeens     Heart disease Mother Ronal Skeens         A fib/ chf    Stroke Mother Ronal Skeens     Heart attack Father Bette Skeens 61    Cancer Son Donnice Ratel         Thyroid     Cancer Maternal Uncle Larnell Ponto  Vicci Saddie RAMAN, NP  08/09/23 2125

## 2023-08-09 NOTE — ED Notes (Signed)
 Attempted to urinate but can't provide sample,provider made aware

## 2023-08-09 NOTE — ED Notes (Signed)
 EMERGENCY DEPARTMENT ATTENDING PHYSICIAN NOTE     I performed the substantive portion of the MDM. For the problems addressed, I personally developed, reviewed, and/or approved the plan and assessment as documented by the APP.  Patient was seen by APP, see their note for further documentation of history and exam.    BRIEF HISTORY OF PRESENT ILLNESS AND ADDITIONAL EXAM FINDINGS     Chief Complaint: Abdominal Pain       History per APP    68 y.o. female presents with LLQ pain. Started Wednesday. History of diverticulitis    Triage Vitals:  ED Triage Vitals [08/09/23 1851]   Encounter Vitals Group      BP 126/78      Systolic BP Percentile       Diastolic BP Percentile       Heart Rate 74      Resp Rate 16      Temp 98.1 F (36.7 C)      Temp src Oral      SpO2 99 %      Weight 98.9 kg      Height 1.575 m      Head Circumference       Peak Flow       Pain Score 9      Pain Loc       Pain Education       Exclude from Growth Chart             MEDICAL DECISION MAKING   CT with diverticulitis. No fevers. Minimal WBC elevation. Plan for abx if fails to improve in 24-48 hours        Vital Signs: Reviewed the patient's vital signs.   Nursing Notes: Reviewed and utilized available nursing notes.  Medical Records Reviewed: Reviewed available past medical records.    CARDIAC STUDIES    The following cardiac studies were independently interpreted by me the Emergency Medicine Physician.  For full cardiac study results please see chart. I discussed testing results with the APP.              EMERGENCY IMAGING STUDIES    The following imagine studies were independently interpreted by me (emergency medicine physician). I discussed testing results with the APP.                     RADIOLOGY IMAGING STUDIES      CT Abd/Pelvis with IV Contrast   Final Result    Mild pericolonic inflammatory stranding adjacent to the colon   near the junction of the descending and sigmoid colon consistent with   descending colon diverticulitis.       Toribio Hering, MD   08/09/2023 8:48 PM          EMERGENCY DEPT. MEDICATIONS      ED Medication Orders (From admission, onward)      Start Ordered     Status Ordering Provider    08/09/23 2027 08/09/23 2028  iohexol  (OMNIPAQUE ) 350 MG/ML injection 100 mL  IMG once as needed        Route: Intravenous  Ordered Dose: 100 mL       Last MAR action: Imaging Agent Given VICCI SADDIE RAMAN    08/09/23 1926 08/09/23 1925  morphine  injection 4 mg  Once        Route: Intravenous  Ordered Dose: 4 mg       Last MAR action: Given JOHNSON, KARA S    08/09/23 1926 08/09/23  1925  ondansetron  (ZOFRAN ) injection 4 mg  Once        Route: Intravenous  Ordered Dose: 4 mg       Last MAR action: Given JOHNSON, KARA S            LABORATORY RESULTS    Ordered and independently interpreted AVAILABLE laboratory tests.   Results       Procedure Component Value Units Date/Time    Comprehensive Metabolic Panel [[id number]]  (Abnormal) Collected: 08/09/23 1908    Specimen: Blood, Venous Updated: 08/09/23 1931     Glucose 110 mg/dL      BUN 21 mg/dL      Creatinine 0.7 mg/dL      Sodium 856 mEq/L      Potassium 3.7 mEq/L      Chloride 104 mEq/L      CO2 25 mEq/L      Calcium  9.0 mg/dL      Anion Gap 85.9     GFR >60.0 mL/min/1.73 m2      AST (SGOT) 16 U/L      ALT 15 U/L      Alkaline Phosphatase 52 U/L      Albumin  3.6 g/dL      Protein, Total 6.4 g/dL      Globulin 2.8 g/dL      Albumin /Globulin Ratio 1.3     Bilirubin, Total 0.4 mg/dL     Lipase [[id number]]  (Normal) Collected: 08/09/23 1908    Specimen: Blood, Venous Updated: 08/09/23 1931     Lipase 28 U/L     CBC with Differential (Order) [[id number]]  (Abnormal) Collected: 08/09/23 1908    Specimen: Blood, Venous Updated: 08/09/23 1914    Narrative:      The following orders were created for panel order CBC with Differential (Order).  Procedure                               Abnormality         Status                     ---------                               -----------         ------                      CBC with Differential (...[[id number]]  Abnormal            Final result                 Please view results for these tests on the individual orders.    CBC with Differential (Component) [[id number]]  (Abnormal) Collected: 08/09/23 1908    Specimen: Blood, Venous Updated: 08/09/23 1914     WBC 10.63 x10 3/uL      Hemoglobin 13.5 g/dL      Hematocrit 59.6 %      Platelet Count 269 x10 3/uL      MPV 11.1 fL      RBC 4.30 x10 6/uL      MCV 93.7 fL      MCH 31.4 pg      MCHC 33.5 g/dL      RDW 13 %      nRBC % 0.0 /100 WBC  Absolute nRBC 0.00 x10 3/uL      Preliminary Absolute Neutrophil Count 7.99 x10 3/uL      Neutrophils % 75.1 %      Lymphocytes % 16.1 %      Monocytes % 6.3 %      Eosinophils % 1.9 %      Basophils % 0.3 %      Immature Granulocytes % 0.3 %      Absolute Neutrophils 7.99 x10 3/uL      Absolute Lymphocytes 1.71 x10 3/uL      Absolute Monocytes 0.67 x10 3/uL      Absolute Eosinophils 0.20 x10 3/uL      Absolute Basophils 0.03 x10 3/uL      Absolute Immature Granulocytes 0.03 x10 3/uL               CRITICAL CARE/PROCEDURES        DIAGNOSIS    I (ED Physician) discussed final disposition with the APP    Diagnosis:  Final diagnoses:   None       Disposition:  ED Disposition       None            Prescriptions:  Patient's Medications   New Prescriptions    No medications on file   Previous Medications    CLONAZEPAM  (KLONOPIN ) 1 MG TABLET    Take 1 tablet (1 mg) by mouth 3 (three) times daily as needed    GABAPENTIN  (NEURONTIN ) 300 MG CAPSULE    TAKE 1 CAPSULE (300 MG) BY MOUTH 3 (THREE) TIMES DAILY FOR 3 DAYS    KEPPRA  XR 750 MG EXTENDED RELEASE 24 HR TABLET    Takes 1,500 mg po q AM and 750 mg po q PM    LACOSAMIDE  (VIMPAT ) 200 MG TAB    Take by mouth 2 (two) times daily    LANSOPRAZOLE  (PREVACID ) 30 MG CAPSULE    TAKE 1 CAPSULE (30 MG) BY MOUTH DAILY    LEVOTHYROXINE  (SYNTHROID ) 100 MCG TABLET    Take 1 tablet (100 mcg) by mouth Once each week on Sunday morning    LEVOTHYROXINE   (SYNTHROID ) 88 MCG TABLET    Take 1 tablet (88 mcg) by mouth Once every morning except Sunday    METOPROLOL  SUCCINATE XL (TOPROL -XL) 25 MG 24 HR TABLET    Take 1 tablet (25 mg) by mouth daily    NON FORMULARY    VITAMINS: MVI, calcium , b12, b50, vit D    ONDANSETRON  (ZOFRAN -ODT) 4 MG DISINTEGRATING TABLET    TAKE 1 TABLET (4 MG) BY MOUTH EVERY 8 (EIGHT) HOURS AS NEEDED FOR NAUSEA    ROSUVASTATIN  (CRESTOR ) 5 MG TABLET    TAKE ONE TABLET BY MOUTH AT BEDTIME    TRIAMTERENE -HYDROCHLOROTHIAZIDE  (DYAZIDE ) 37.5-25 MG PER CAPSULE    TAKE ONE CAPSULE BY MOUTH EVERY MORNING.   Modified Medications    No medications on file   Discontinued Medications    No medications on file          Garon Eden, MD  08/09/23 2206

## 2023-08-09 NOTE — ED Triage Notes (Addendum)
 Pt ambulatory endorsing LLQ pain, constipation and that decreased PO intake began yesterday. Reports periodic constipation which is relieved by miralax . States pain worsening. +nausea. Denies fevers or bloody BM. Took tylenol  PTA. 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 Last BM last night but reports severe straining. A/Ox4.

## 2023-08-09 NOTE — ED Notes (Signed)
 Receive patient alert and oriented x4,with c/o left sided abdominal pain with nausea,denies chest pain or shortness of breathing  Coherent and responsive  Identified thru name and date of birth  Instructed to keep on NPO  Awaiting for the plan

## 2023-08-15 ENCOUNTER — Ambulatory Visit (HOSPITAL_BASED_OUTPATIENT_CLINIC_OR_DEPARTMENT_OTHER): Payer: Medicare Other | Admitting: Licensed Clinical Social Worker

## 2023-08-15 VITALS — Wt 217.0 lb

## 2023-08-15 DIAGNOSIS — F439 Reaction to severe stress, unspecified: Secondary | ICD-10-CM

## 2023-08-15 DIAGNOSIS — Z719 Counseling, unspecified: Secondary | ICD-10-CM

## 2023-08-15 DIAGNOSIS — Z903 Acquired absence of stomach [part of]: Secondary | ICD-10-CM

## 2023-08-15 NOTE — Progress Notes (Signed)
 Outpatient Services Individual Bariatric Behavioral Health Follow Up Note    08/15/2023    Start: 10:00 AM End:  10:28 AM    Modality: in clinic    Interpreter present? no    Reason for appointment: Jennifer [name] presents for a behavioral health follow up appointment in support of an optimal outcome for her sleeve bariatric surgery date 04/26/2023.      Current weight: 217 lb  Weight at pre-op liquid diet: 242 lb  Lbs loss: 25 lb  Goal weight: same as in her assessment    Compliance with lifestyle/dietary changes and household adjustments:  ~clear fluid intake: 64 oz  ~protein shake intake: 2 shakes; Ensure Max   ~taking vitamins and supplements: Y or N  ~measuring portions with meals: Y or N; she is     Current Exercise Habits walking  ~recovering from the flu so hasn't been able to really exercise    Future behavioral goals:  ~work on incorporating more strength training in her routine  ~wants to start swimming as well in her routine    Barriers to compliance:  ~    Support and/or Support Groups:  ~her husband  ~her family    Substance Use:  Use of caffeine: denies use  Tobacco use: no  Substance Use:  none    Diet Recall:              Breakfast: oatmeal w/small amount of 1% milk.              Lunch: 2pm tuna or chicken salad, some applesauce              Dinner: 7/8pm protein and vegetables. Last night had oatmeal as she didn't like what family was having. She may have a [place] burger w/applesauce.              Snacks: 11am protein shake and second at 3pm. Denies other snacking.              Beverages: water, decaf iced tea    Assessments and inventories:     Completed Screening Measures:       08/15/2023    10:00 AM 08/14/2023     1:12 PM 04/01/2023    11:30 AM   PHQ2/PHQ9 SCORE    PHQ2 Score 0   0    PHQ9 Score  1         Patient-reported         08/14/2023     1:12 PM 03/31/2023    11:53 AM   GAD-7 Scores   Feeling nervous, anxious or on edge 0 0   Not being able to stop or control worrying 0 0   Worrying  too much about different things 0 0   Trouble relaxing 0 0   Being so restless that it is hard to sit still 0 0   Becoming easily annoyed or irritable 0 0   Feeling afraid as if something awful might happen 0 0   GAD-7 Total Score 0  0        Patient-reported     Mental Status Exam:   General Appearance: neatly groomed, adequately nourished, and casually dressed  Behavior/Psychomotor: normal  and good eye contact  Mood: euthymic  Affect: congruent with mood  Speech: normal pitch and normal volume  Language: verbal expression is clear , has comprehensible ideas through verbal articulation, and ideas are conveyed and properly produced  Thought Form/Process: well organized, associations intact,  and goal directed  Thought Content: absent of psychotic symptoms and absent of suicidal or homicidal ideation  Perception /Sensorium: clear, intact and makes sense of stimuli received  Judgment: good  Insight: good  Cognitive Functioning: cognition grossly intact, alert, oriented to person, oriented to place, oriented to time, oriented to situation, memory intact, and attentive     Risk Assessment:   Risk factors:Chronic physical pain or other acute medical problem (e.g. CNS disorders)   Protective Factors: Identifies reasons for living Responsibility to family or others; living with family, Supportive social network of family or friends, and Engaged in work or school  Suicide Attempts/Gestures/Thoughts/Plan:-No suicidal thoughts, no intent, no plan  Homicidal Thoughts/Plan/Gestures:-No homicidal thoughts, no intent, no plan.  Recent Physical Aggression/Violence/Anger:Denies associated symptoms.  Access to Weapons: none    Diagnosis & Problem:      1. Health education/counseling        2. Stress        3. Status post sleeve gastrectomy        4. Severe obesity (BMI 35.0-39.9) with comorbidity (CMS/HCC)          Patient Active Problem List    Diagnosis Date Noted    Morbid obesity (CMS/HCC) 04/26/2023    Morbid obesity with BMI  of 45.0-49.9, adult (CMS/HCC) 04/10/2023    DDD (degenerative disc disease), lumbosacral 12/26/2022    Prediabetes 12/26/2022    Class 3 severe obesity with serious comorbidity and body mass index (BMI) of 40.0 to 44.9 in adult, unspecified obesity type 12/26/2022    Seizure disorder (CMS/HCC) 12/26/2022    SVT (supraventricular tachycardia) 12/26/2022    History of colon resection 12/26/2022    History of cholecystectomy 12/26/2022    History of maternal deep vein thrombosis (DVT) 12/26/2022    Ventricular tachyarrhythmia (CMS/HCC) 04/06/2022    Hypertriglyceridemia 04/06/2022    Hypothyroidism, unspecified type 04/06/2022    Mass of right breast, unspecified quadrant 09/25/2021    Primary osteoarthritis of left knee 02/12/2020       Clinical Summary & Plan:     Jennifer [name] presents with 3 month post sleeve bariatrics BH follow up. She states that her MH and self-confidence has improved since the weight loss and her energy levels have improved.     Jennifer [name] challenges to focus on are in the following areas:  ~work on incorporating more strength training in her routine  ~wants to start swimming as well in her routine    Jennifer [name] strengths are: she has a good support system    Clinical Recommendations and Plan:   ~work on incorporating more strength training in her routine  ~wants to start swimming as well in her routine    The patient demonstrated capacity, understood and agreed with this plan of care and practice guidelines. An opportunity for questions was given. The patient is safe to leave from appointment. Plan shared with bariatrics team      Referrals provided: No, will see her at the [facility] SLEEVE [facility] in September 2025.     Yehuda Budd, PhD, LCSW

## 2023-08-15 NOTE — Patient Instructions (Signed)
 Hello!     Thank you for meeting with me! I look forward to your continued success in the program overall! Please feel free to reach out for support if you need it and you can continue to see me for a visit even before your follow up appointments pre-op or post-op.    Mondays, Wednesdays and Thursdays: [place] office, Tuesdays at [employer] office and Fridays are virtual only  Starting in November I will be [place] only M-Th and Fridays virtual  [address] [place], [place] [address]     The Facebook group: [facility] Loss Support Group  This is the group is one you can join now as a pre-op patient in our program. Please answer all group entry questions as we do check that you are a patient of ours and are in the non-surgical medical weight loss program or surgical and in the program.     The Facebook group is called: [employer] Support Group  Feel free to join and gain your "village" of support there! You can join once you have had surgery or are in the pre-op liquid diet, please answer all questions when joining the group, we do check to match them with patient charts and see if you are our patient and if you are in the pre-op liquid diet phase or post op.         Below is a list of support groups that we offer.     Thank you!! ??     Bariatric Support Groups Support groups are for the purpose of peer support and sharing general information. Participation in support groups should not take the place of individual and / or group therapy which care recommended forums to examine psychological matters.     Pre & Post-Surgical Behavioral Support In Person: 4th Monday of the month at 6:00pm. Hosted by: Yehuda Budd, LCSW   YOU DO NOT NEED TO RSVP TO THIS GROUP, PLEASE JUST COME IF YOU WOULD LIKE TO, WE ARE HAPPY TO HAVE YOU.  In Person: 4th Monday of the month at 6:00pm at [facility] room ([address] [place], [place] [address]- Conference Room, 1st floor) Hosted by: Yehuda Budd, PhD, LCSW     [facility] Virtual Bariatric Support Group Facilitated 3rd Monday of the month 6:00pm-7:00pm Hosted by: Registered Dieticians, Exercise Physiologists, Licensed Professional Counselors Announced via [employer] group. Pre-op and Post-op Welcome. Varied topics include exercise, body acceptance, tolerating food cravings, labs. For contact information: Email [email or web address]     Pre and Post Op Surgical Support Lunch time group; 4th Wednesday of the month at 12p, hosted by Demetrio Lapping, PhD, for more information and the link please email her at: [email or web address] .[facility] [address] [place], [place] [address] P [phone] (opt 3) F [phone] [address] [place], [place] [address] P [phone] (opt 2) F [phone]- for Einar Gip and [phone] for [facility] office

## 2023-10-09 ENCOUNTER — Ambulatory Visit (INDEPENDENT_AMBULATORY_CARE_PROVIDER_SITE_OTHER): Payer: Medicare Other | Admitting: Internal Medicine

## 2023-10-09 ENCOUNTER — Encounter (INDEPENDENT_AMBULATORY_CARE_PROVIDER_SITE_OTHER): Payer: Self-pay | Admitting: Internal Medicine

## 2023-10-09 VITALS — BP 115/75 | HR 67 | Temp 98.4°F | Ht 61.5 in | Wt 213.0 lb

## 2023-10-09 DIAGNOSIS — G40909 Epilepsy, unspecified, not intractable, without status epilepticus: Secondary | ICD-10-CM

## 2023-10-09 DIAGNOSIS — I10 Essential (primary) hypertension: Secondary | ICD-10-CM

## 2023-10-09 DIAGNOSIS — Z1231 Encounter for screening mammogram for malignant neoplasm of breast: Secondary | ICD-10-CM

## 2023-10-09 DIAGNOSIS — E785 Hyperlipidemia, unspecified: Secondary | ICD-10-CM

## 2023-10-09 DIAGNOSIS — Z0001 Encounter for general adult medical examination with abnormal findings: Secondary | ICD-10-CM

## 2023-10-09 DIAGNOSIS — E039 Hypothyroidism, unspecified: Secondary | ICD-10-CM

## 2023-10-09 DIAGNOSIS — M25511 Pain in right shoulder: Secondary | ICD-10-CM

## 2023-10-09 DIAGNOSIS — Z0000 Encounter for general adult medical examination without abnormal findings: Secondary | ICD-10-CM

## 2023-10-09 MED ORDER — TRIAMTERENE-HCTZ 37.5-25 MG PO CAPS
1.0000 | ORAL_CAPSULE | Freq: Every morning | ORAL | 1 refills | Status: DC
Start: 2023-10-09 — End: 2024-04-14

## 2023-10-09 MED ORDER — ROSUVASTATIN CALCIUM 5 MG PO TABS: 5.0000 mg | ORAL_TABLET | Freq: Every evening | ORAL | 1 refills | Status: DC

## 2023-10-09 NOTE — Progress Notes (Signed)
 Have you seen any specialists/other providers since your last visit with us ?    No    Health Maintenance Due   Topic Date Due    Advance Directive on File  Never done    COVID-19 Vaccine (7 - 2024-25 season) 08/16/2023

## 2023-10-09 NOTE — Progress Notes (Signed)
 [facility]   Medicare Wellness Visit                 Jennifer [name] is a 68 y.o. female who presents today  for the following Medicare Wellness Visit:  []  Initial Preventive Physical Exam (IPPE) - Welcome to Medicare preventive visit (Vision Screening required)   []  Annual Wellness Visit - Initial  [x]  Annual Wellness Visit - Subsequent                                                                                                                                                 Health Risk Assessment   During the past month, how would you rate your general health?:  (Patient-Rptd) (P) Very Good  Which of the following tasks can you do without assistance - drive or take the bus alone; shop for groceries or clothes; prepare your own meals; do your own housework/laundry; handle your own finances/pay bills; eat, bathe or get around your home?: (Patient-Rptd) (P) Drive or take the bus alone, Shop for groceries or clothes, Prepare your own meals, Do your own housework/laundry, Handle your own finances/pay bills, Eat, bathe, dress or get around your home  Which of the following problems have you been bothered by in the past month - dizzy when standing up; problems using the phone; feeling tired or fatigued; moderate or severe body pain?: (Patient-Rptd) (P) None of these  Do you exercise for about 20 minutes 3 or more days per week?:(Patient-Rptd) (P) No  During the past month was someone available to help if you needed and wanted help?  For example, if you felt nervous, lonely, got sick and had to stay in bed, needed someone to talk to, needed help with daily chores or needed help just taking care of yourself.: (Patient-Rptd) (P) Yes  Do you always wear a seat belt?: (Patient-Rptd) (P) Yes  Do you have any trouble taking medications the way you have been told to take them?: (Patient-Rptd) (P) No  Have you been given any information that can help you with keeping track of your medications?: (Patient-Rptd) (P)  Yes  Do you have trouble paying for your medications?: (Patient-Rptd) (P) No  Have you been given any information that can help you with hazards in your house, such as scatter rugs, furniture, etc?: (Patient-Rptd) (P) No  Do you feel unsteady when standing or walking?: (Patient-Rptd) (P) No  Do you worry about falling?: (Patient-Rptd) (P) Yes  Have you fallen two or more times in the past year?: (Patient-Rptd) (P) Yes  Did you suffer any injuries from your falls in the past year?: (Patient-Rptd) (P) No    Hospitalizations   Hospitalization within past year: [x]  No  []  Yes     Diagnosis:    Screenings         08/05/2023 08/14/2023 10/02/2023   Ambulatory  Screenings   Falls Risk: Terrilee more than 2 times in past year N  Y   Falls Risk: Suffer any injuries? N  N   Depression: PHQ2 Total Score  0      Depression: PHQ9 Total Score  1         Patient-reported    Data saved with a previous flowsheet row definition        Substance Use Disorder Screen:  In the past year, how often have you used the following?  1) Alcohol (For men, 5 or more drinks a day. For women, 4 or more drinks a day)  [x]  Never []  Once or Twice []  Monthly []  Weekly []  Daily or Almost Daily  2) Tobacco Products  [x]  Never []  Once or Twice []  Monthly []  Weekly []  Daily or Almost Daily  3) Prescription Drugs for Non-Medical Reasons  [x]  Never []  Once or Twice []  Monthly []  Weekly []  Daily or Almost Daily  4) Illegal Drugs  [x]  Never []  Once or Twice []  Monthly []  Weekly []  Daily or Almost Daily            Functional Ability/Level of Safety   Falls Risk/Home Safety Assessment:  (see HRA and Screenings sections for additional assessment)    Home Safety:   [x]  Low Risk for Falls  [x]  Skid-resistant rugs/remove throw rugs   [x]  Grab bars    [x]  Clear pathways between rooms    [x]  Proper lighting stairs/ bathrooms/bedrooms    Get Up and Go (optional):    []   <20 secs    []   >20 secs    []   High risk for falls - Home Safety/Falls Risk Precautions reviewed with  pt/family    Hearing Assessment:  Concerns for hearing loss: []  Yes  [x]   No  Hearing aids:   []   Right  []   Left  []   Bilateral   [x]   None  Whisper Test (optional):  []  Normal  []   Slightly decreased  []   Significantly decreased    Visual Acuity:     If no visual acuity exam above:  [x] Patient Declines  []  Up to date with Optometry/Ophthalmology per patient report.    Exercise:  Frequency:  []   No formal exercise  [x]   1-2x/wk  []   3-4x/wk  []   >4x/wk  Duration:  [x]   15-30 mins/day  []   30-45 mins/day  []   45+ mins/day  Intensity:  [x]   Light  []   Moderate  []   Heavy    Diet:  Diet: - consumes a well balanced diet  - compliant with heart healthy and well balanced diet      Activities of Daily Living     ADL's Independent Minimal  Assistance Moderate  Assistance Total   Assistance   Bathing [x]  []  []  []    Dressing [x]  []  []  []    Mobility   [x]  []  []  []    Transfer [x]  []  []  []    Eating [x]  []  []  []    Toileting [x]  []  []  []      IADL's Independent Minimal  Assistance Moderate  Assistance Total   Assistance   Phone [x]  []  []  []    Housekeeping [x]  []  []  []    Laundry [x]  []  []  []    Transportation [x]  []  []  []    Medications [x]  []  []  []    Finances [x]  []  []  []       ADL assistance:   [x]  No assistance needed    []  Spouse    []   Sibling    []  Son   []  Daughter   []  Children    []  Home Health Aide   []  Other:    Social Activities/Engagement   Frequency of Communication with Friends and Family:  []  Never []  Rarely []  Sometimes []  Often[x]  Very often    Frequency of Social Gatherings with Friends and Family:   []  Never []  Rarely []  Sometimes [x]  Often[]  Very often    Advanced Care Planning   Discussion of Advance Directives:   []  Advance Directive in chart    []  Advance Directive not in chart - requested to provide   [x]  No Advance Directive.  Form Provided    []  No Advance Directive.  Pt declines.   []  Not addressed today     []  Other:         Exam   BP 115/75 (BP Site: Left arm, Patient Position: Sitting, Cuff Size: Medium)    Pulse 67   Temp 98.4 F (36.9 C) (Oral)   Ht 1.562 m (5' 1.5)   Wt 96.6 kg (213 lb)   BMI 39.59 kg/m   Physical Exam  Vitals reviewed.   Constitutional:       General: She is not in acute distress.     Appearance: She is well-developed. She is obese.   Eyes:      General: No scleral icterus.     Conjunctiva/sclera: Conjunctivae normal.   Neck:      Thyroid : No thyromegaly.      Vascular: No carotid bruit or JVD.   Cardiovascular:      Rate and Rhythm: Normal rate and regular rhythm.      Heart sounds: Normal heart sounds. No murmur heard.     No friction rub. No gallop.   Pulmonary:      Effort: Pulmonary effort is normal. No respiratory distress.      Breath sounds: Normal breath sounds. No rales.   Abdominal:      General: Bowel sounds are normal. There is no distension.      Palpations: Abdomen is soft.      Tenderness: There is no abdominal tenderness. There is no guarding or rebound.   Musculoskeletal:         General: Tenderness present.      Cervical back: Neck supple.      Right lower leg: No edema.      Left lower leg: No edema.      Comments: +right shoulder tenderness, reduced ROM   Neurological:      Mental Status: She is alert.             Evaluation of Cognitive Function   Mood/affect: [x]  Appropriate  []   Other:   Appearance: [x]  Neatly groomed  [x]  Adequately nourished  []  Other:  Family member/caregiver input: []  Present - no concerns  []   Not present in room  []  Present - concerns:    Cognitive Assessment:  Mini-Cog Result (three word registration- banana, sunrise, chair / clock drawing):   [x]   > 3 points - negative screen for dementia   []  3 recalled words - negative screen for dementia   []  1-2 recalled words and normal clock draw - negative for cognitive impairment   []  1-2 recalled words and abnormal clock draw - positive for cognitive impairment   []  0 recalled words - positive for cognitive impairment         Personalized Prevention Plan   The patient was provided with a  personalized prevention plan via   a printed copy                                                                                                                                                 Assessment/Plan     Assessment & Plan  Medicare annual wellness visit, subsequent         Encounter for screening mammogram for malignant neoplasm of breast    Orders:    Mammo Digital 2D Screening Bilateral W CAD; Future    Acute pain of right shoulder    Orders:    Referral to Orthopedic Surgery ([facility]); Future    XR Shoulder Right 2+ Views; Future    Referral to Physical Therapy-[facility]; Future    Hypothyroidism (acquired)         Hyperlipidemia LDL goal <100    Orders:    rosuvastatin  (CRESTOR ) 5 MG tablet; Take 1 tablet (5 mg) by mouth once nightly    Seizure disorder (CMS/HCC)         Hypertension, unspecified type    Orders:    triamterene -hydrochlorothiazide  (DYAZIDE ) 37.5-25 MG per capsule; Take 1 capsule by mouth once every morning          History/Care Team   Patient Care Team:  Sallyann Dennison DEL, MD as PCP - General (Internal Medicine)  Read Papas, MD as Consulting Physician (Cardiology)  Lettie Author [place], [place] as Physician Assistant (Physician Assistant)  Claudene Channing LABOR, MD PhD as Consulting Physician (Neurology)  [x]  Medical, surgical, family history reviewed and updated during this encounter  [x]  Medication list updated and reconciled during this encounter    Additional Documentation         Seizure d/o, sees neuro, no recent activitiy.     Hypothyroidism,   Lab Results   Component Value Date    TSH 1.65 08/05/2023    TSH 4.55 07/12/2022             HTN, home BP 100-110's/50-70's,     Hyperlipidemia, tolerating med  Lab Results   Component Value Date    CHOL 144 06/20/2023    CHOL 160 03/04/2023    CHOL 173 10/19/2022     Lab Results   Component Value Date    HDL 37 (L) 06/20/2023    HDL 36 (L) 03/04/2023    HDL 40 10/19/2022     Lab Results   Component Value Date    LDL 67 06/20/2023    LDL 68  03/04/2023    LDL 89 10/19/2022     Lab Results   Component Value Date    TRIG 200 (H) 06/20/2023    TRIG 281 (H) 03/04/2023    TRIG 219 (H) 10/19/2022        Hyperlipidemia,             Right  shoulder pain x several months, no injury  Pain level most 9-10/10, achy  Not with lying on it.   No prior xray,     Right handed,

## 2023-10-15 ENCOUNTER — Ambulatory Visit (INDEPENDENT_AMBULATORY_CARE_PROVIDER_SITE_OTHER): Payer: Medicare Other | Admitting: Internal Medicine

## 2023-10-25 ENCOUNTER — Other Ambulatory Visit (INDEPENDENT_AMBULATORY_CARE_PROVIDER_SITE_OTHER): Payer: Self-pay | Admitting: Internal Medicine

## 2023-10-30 ENCOUNTER — Other Ambulatory Visit (FREE_STANDING_LABORATORY_FACILITY)

## 2023-10-30 DIAGNOSIS — Z1321 Encounter for screening for nutritional disorder: Secondary | ICD-10-CM

## 2023-10-30 DIAGNOSIS — Z713 Dietary counseling and surveillance: Secondary | ICD-10-CM

## 2023-10-30 DIAGNOSIS — Z9884 Bariatric surgery status: Secondary | ICD-10-CM

## 2023-10-30 DIAGNOSIS — E43 Unspecified severe protein-calorie malnutrition: Secondary | ICD-10-CM

## 2023-10-30 LAB — COMPREHENSIVE METABOLIC PANEL
ALT: 15 U/L (ref <=55)
AST (SGOT): 20 U/L (ref <=41)
Albumin/Globulin Ratio: 1.3 (ref 0.9–2.2)
Albumin: 3.5 g/dL (ref 3.5–5.0)
Alkaline Phosphatase: 55 U/L (ref 37–117)
Anion Gap: 10 (ref 5.0–15.0)
BUN: 15 mg/dL (ref 7–21)
Bilirubin, Total: 0.2 mg/dL (ref 0.2–1.2)
CO2: 29 meq/L (ref 17–29)
Calcium: 9.3 mg/dL (ref 8.5–10.5)
Chloride: 102 meq/L (ref 99–111)
Creatinine: 0.8 mg/dL (ref 0.4–1.0)
GFR: >60 mL/min/1.73 m2 (ref >=60.0)
Globulin: 2.6 g/dL (ref 2.0–3.6)
Glucose: 74 mg/dL (ref 70–100)
Hemolysis Index: 2 {index}
Potassium: 4.2 meq/L (ref 3.5–5.3)
Protein, Total: 6.1 g/dL (ref 6.0–8.3)
Sodium: 141 meq/L (ref 135–145)

## 2023-10-30 LAB — LIPID PANEL
Cholesterol / HDL Ratio: 3.8 {index}
Cholesterol: 162 mg/dL (ref <=199)
HDL: 43 mg/dL (ref >=40)
LDL Calculated: 81 mg/dL (ref 0–99)
Triglycerides: 192 mg/dL — ABNORMAL HIGH (ref 34–149)
VLDL Calculated: 38 mg/dL (ref 10–40)

## 2023-10-30 LAB — CBC
Absolute nRBC: 0 x10 3/uL (ref <=0.00)
Hematocrit: 40.2 % (ref 34.7–43.7)
Hemoglobin: 12.7 g/dL (ref 11.4–14.8)
MCH: 30.2 pg (ref 25.1–33.5)
MCHC: 31.6 g/dL (ref 31.5–35.8)
MCV: 95.7 fL (ref 78.0–96.0)
MPV: 12.4 fL (ref 8.9–12.5)
Platelet Count: 296 x10 3/uL (ref 142–346)
RBC: 4.2 x10 6/uL (ref 3.90–5.10)
RDW: 13 % (ref 11–15)
WBC: 7.65 x10 3/uL (ref 3.10–9.50)
nRBC %: 0 /100{WBCs} (ref <=0.0)

## 2023-10-30 LAB — VITAMIN D, 25 OH, TOTAL: Vitamin D 25-OH, Total: 99 ng/mL (ref 30–100)

## 2023-10-30 LAB — VITAMIN B12: Vitamin B-12: 719 pg/mL (ref 211–911)

## 2023-10-30 LAB — IRON PROFILE
Iron Saturation: 26 % (ref 15–50)
Iron: 65 ug/dL (ref 32–157)
TIBC: 248 ug/dL — ABNORMAL LOW (ref 265–497)
UIBC: 183 ug/dL (ref 126–382)

## 2023-10-30 LAB — FOLATE
Folate: 7.5 ng/mL (ref >5.4)
Hemolysis Index: 4 {index}

## 2023-11-02 LAB — VITAMIN A: Vitamin A: 54 ug/dL (ref 38–98)

## 2023-11-04 ENCOUNTER — Telehealth (INDEPENDENT_AMBULATORY_CARE_PROVIDER_SITE_OTHER): Payer: Self-pay | Admitting: Internal Medicine

## 2023-11-04 NOTE — Telephone Encounter (Signed)
 Copied from CRM [id number]. Topic: Non-Appointment Question - Document Request  >> Nov 04, 2023  2:36 PM [name] wrote:  Jennifer [name] called about Non-Appointment Question - Document Request.  Additional details:  Pt request [name] list or recommendation of neurologist. Pt has not been to successful in finding one. Please advise

## 2023-11-06 ENCOUNTER — Ambulatory Visit (INDEPENDENT_AMBULATORY_CARE_PROVIDER_SITE_OTHER): Admitting: Family

## 2023-11-06 ENCOUNTER — Ambulatory Visit (HOSPITAL_BASED_OUTPATIENT_CLINIC_OR_DEPARTMENT_OTHER): Admitting: Licensed Clinical Social Worker

## 2023-11-06 LAB — WHOLE BLOOD VITAMIN B1 (THIAMINE): Vitamin B1, Whole Blood: 126 nmol/L (ref 78–185)

## 2023-11-07 ENCOUNTER — Telehealth (INDEPENDENT_AMBULATORY_CARE_PROVIDER_SITE_OTHER): Admitting: Registered"

## 2023-11-14 ENCOUNTER — Ambulatory Visit (HOSPITAL_BASED_OUTPATIENT_CLINIC_OR_DEPARTMENT_OTHER): Admitting: Licensed Clinical Social Worker

## 2023-11-18 ENCOUNTER — Ambulatory Visit (INDEPENDENT_AMBULATORY_CARE_PROVIDER_SITE_OTHER): Admitting: Emergency Medicine

## 2023-11-18 ENCOUNTER — Encounter (INDEPENDENT_AMBULATORY_CARE_PROVIDER_SITE_OTHER): Payer: Self-pay

## 2023-11-18 DIAGNOSIS — J029 Acute pharyngitis, unspecified: Secondary | ICD-10-CM

## 2023-11-18 DIAGNOSIS — J069 Acute upper respiratory infection, unspecified: Secondary | ICD-10-CM

## 2023-11-18 DIAGNOSIS — R059 Cough, unspecified: Secondary | ICD-10-CM

## 2023-11-18 LAB — STREP A POCT GOHEALTH: Rapid Strep A Screen POCT: NEGATIVE

## 2023-11-18 LAB — POC COVID QUICKVUE ANTIGEN: QuickVue SARS COV2 Antigen POCT: NEGATIVE

## 2023-11-18 NOTE — Progress Notes (Signed)
 [name]  OFFICE NOTE       Subjective     Patient      Chief Complaint   Patient presents with    Otalgia     Pt c/o ear pain bilateral worst right x4days        History of Present Illness  Jennifer [name] is a 68 year old female who presents with sore throat and ear pain.    She has experienced a sore throat and severe right ear pain for four days. Rhinorrhea and increased sniffing, especially at night, are present and may contribute to ear pressure and pain. She uses Benadryl  at night for symptom relief and sleep. There is no recent exposure to sick individuals, though she lives with her 103-year-old granddaughter who attends daycare.         History:  Medications and Allergies reviewed.   Pertinent Past Medical, Surgical, Family and Social History were reviewed.        Objective     Vitals:    11/18/23 1356   BP: 126/78   Pulse: 64   Resp: 18   Temp: 98.6 F (37 C)   SpO2: 97%   Weight: 99.5 kg (219 lb 6.4 oz)   Height: 1.562 m (5' 1.5)       Physical Exam  GENERAL: Alert, cooperative, well developed, no acute distress.  HEENT: Normocephalic, normal oropharynx, moist mucous membranes, cerumen present in right ear canal, TM partially visible and is unremarkable, not erythematous.  CHEST: Clear to auscultation bilaterally, no wheezes, rhonchi, or crackles.  CARDIOVASCULAR: Normal heart rate and rhythm, S1 and S2 normal without murmurs.  EXTREMITIES: No cyanosis or edema.  NEUROLOGICAL: Cranial nerves grossly intact, moves all extremities without gross motor or sensory deficit.      Urgent Care Course     There were no labs reviewed with this patient during the visit.    There were no x-rays reviewed with this patient during the visit.               Procedures     Procedures         Assessment/Plan         Assessment & Plan  Upper Respiratory Infection  Symptoms suggest viral pharyngitis or COVID-19. No otitis media observed.  - Perform rapid strep test and COVID-19 test.;  Both tests  negative  - COVID and strep testing are negative  - Routine OTC recommendations per written AVS provided.             Jennifer [name] was seen today  for otalgia.    Diagnoses and all orders for this visit:    Viral URI with cough  -     QuickVue SARS-COV-2 Antigen POCT; Future  -     Rapid Strep A POCT; Future  -     QuickVue SARS-COV-2 Antigen POCT  -     Rapid Strep A POCT           The indications for early follow-up with PCP and return to UC were discussed. Patient/family received education on the working diagnosis, diagnostic uncertainties and proposed treatment plan. The possibility of illness progression was discussed and strict ED precautions for serious pathologies were given to patient/family. Written and verbal discharge instructions were provided and discussed, with no apparent barriers. All questions from the patient and family were answered to their satisfaction, and they confirmed understanding of the instructions with no apparent learning barriers.  The patient has provided consent for being recorded during today 's visit.

## 2023-11-18 NOTE — Patient Instructions (Signed)
 Recommend Afrin nasal spray twice a day for 4 days only.  Lozenges, gargles, Tylenol , rest, lots of fluids.  Follow-up primary care as needed

## 2023-11-25 ENCOUNTER — Ambulatory Visit (INDEPENDENT_AMBULATORY_CARE_PROVIDER_SITE_OTHER): Admitting: Family

## 2023-11-25 ENCOUNTER — Ambulatory Visit (INDEPENDENT_AMBULATORY_CARE_PROVIDER_SITE_OTHER)

## 2023-11-25 ENCOUNTER — Encounter (INDEPENDENT_AMBULATORY_CARE_PROVIDER_SITE_OTHER): Payer: Self-pay | Admitting: Family

## 2023-11-25 VITALS — BP 107/68 | HR 74 | Temp 99.6°F | Resp 18 | Wt 208.0 lb

## 2023-11-25 DIAGNOSIS — R051 Acute cough: Secondary | ICD-10-CM

## 2023-11-25 DIAGNOSIS — B9689 Other specified bacterial agents as the cause of diseases classified elsewhere: Secondary | ICD-10-CM

## 2023-11-25 DIAGNOSIS — J208 Acute bronchitis due to other specified organisms: Secondary | ICD-10-CM

## 2023-11-25 MED ORDER — AZITHROMYCIN 250 MG PO TABS
ORAL_TABLET | ORAL | 0 refills | Status: AC
Start: 2023-11-25 — End: 2023-11-30

## 2023-11-25 MED ORDER — GUAIFENESIN 100 MG/5ML PO LIQD
300.0000 mg | Freq: Four times a day (QID) | ORAL | 0 refills | Status: DC | PRN
Start: 2023-11-25 — End: 2024-02-19

## 2023-11-25 NOTE — Progress Notes (Signed)
 [facility]  OFFICE NOTE         Subjective   Historian: Patient      Chief Complaint   Patient presents with    Follow-up     Cough and shortness of breath        History of Present Illness  Jennifer [name] is a 68 year old female who presents with worsening cough and nasal congestion. She has a persistent cough that has worsened since her last visit, with wheezing on expiration. The cough occurs throughout the night, significantly affecting her sleep.  Nasal congestion is present with excessive yellow nasal discharge, requiring frequent nose blowing. Facial tenderness is noted, particularly when pressed, and she experiences an unpleasant taste.  Her sore throat has worsened, but previous tests for strep throat and COVID-19 were negative. No fever is present, and her ear symptoms have improved since the last visit.    History:  Medications and Allergies reviewed.   Pertinent Past Medical, Surgical, Family and Social History were reviewed.          Objective     Vitals:    11/25/23 1154   BP: 107/68   BP Site: Left arm   Patient Position: Sitting   Cuff Size: Large   Pulse: 74   Resp: 18   Temp: 99.6 F (37.6 C)   TempSrc: Tympanic   SpO2: 96%   Weight: 94.3 kg (208 lb)     Physical Exam  Constitutional:       General: She is not in acute distress.     Appearance: Normal appearance. She is not ill-appearing.   HENT:      Head: Normocephalic and atraumatic.      Right Ear: Tympanic membrane, ear canal and external ear normal. There is impacted cerumen.      Left Ear: Tympanic membrane, ear canal and external ear normal. There is no impacted cerumen.     Cardiovascular:      Rate and Rhythm: Normal rate and regular rhythm.      Pulses: Normal pulses.      Heart sounds: Normal heart sounds.   Pulmonary:      Effort: No respiratory distress.      Breath sounds: No stridor. No wheezing, rhonchi or rales.     Neurological:      Mental Status: She is alert.   Chest:      Chest wall: No tenderness.    Vitals reviewed.        Physical Exam  HEENT: Ears normal. Maxillary sinus tenderness.  CHEST: Wheezing on auscultation.  Urgent Care Course   There were no labs reviewed with this patient during the visit.    There were no x-rays reviewed with this patient during the visit.      Procedures   Procedures              Assessment & Plan  Acute Bronchitis  Symptoms consistent with acute Bronchitis, can not R/O sinusitis with nasal congestion, yellow discharge, facial tenderness, and dysgeusia. Symptoms worsened since last visit.  - Order chest x-ray to rule out complications.  X-Ray  The following X-ray studies were ordered, visualized and independently interpreted by me. Results were discussed with the patient/family.     XR Chest 2 Views  Result Date: 11/25/2023  HISTORY: Acute cough. COMPARISON: Chest x-ray 03/04/2023. FINDINGS: Lungs: No focal consolidation, pleural effusion, or pneumothorax. Cardiomediastinal silhouette: Within normal limits. Upper abdomen: Partially imaged IVC filter. Bones/soft  tissues: Negative spine     No acute cardiopulmonary process. Sue CHRISTELLA Fila, DO 11/25/2023 12:33 PM       Acute Bronchitis  Persistent cough and expiratory wheezing, worsening since last visit, with nocturnal exacerbation. Wheezing confirmed on auscultation.  -Provided treatment with 5 days po Antibiotic, Guaifenesin syrup   - Encouraged to optimize po fluid intake, stem exposure       Jennifer [name] was seen today  for follow-up.    Diagnoses and all orders for this visit:    Acute cough  -     XR Chest 2 Views; Future         The indications for early follow-up with PCP and return to UC were discussed. Patient/family received education on the working diagnosis, diagnostic uncertainties, and proposed treatment plan. Indications for emergency evaluation in the ED were reviewed. Written and verbal discharge instructions were provided and discussed and all questions from the patient/family were addressed, with no apparent  barriers.      Verbal consent obtained to record this visit.

## 2023-11-27 ENCOUNTER — Ambulatory Visit (INDEPENDENT_AMBULATORY_CARE_PROVIDER_SITE_OTHER): Admitting: Family

## 2023-11-27 ENCOUNTER — Ambulatory Visit (HOSPITAL_BASED_OUTPATIENT_CLINIC_OR_DEPARTMENT_OTHER): Admitting: Licensed Clinical Social Worker

## 2023-12-03 ENCOUNTER — Telehealth (INDEPENDENT_AMBULATORY_CARE_PROVIDER_SITE_OTHER): Admitting: Family Nurse Practitioner

## 2023-12-03 ENCOUNTER — Encounter (INDEPENDENT_AMBULATORY_CARE_PROVIDER_SITE_OTHER): Payer: Self-pay | Admitting: Family Nurse Practitioner

## 2023-12-03 DIAGNOSIS — Z09 Encounter for follow-up examination after completed treatment for conditions other than malignant neoplasm: Secondary | ICD-10-CM

## 2023-12-03 DIAGNOSIS — R062 Wheezing: Secondary | ICD-10-CM

## 2023-12-03 DIAGNOSIS — R053 Chronic cough: Secondary | ICD-10-CM

## 2023-12-03 MED ORDER — PREDNISONE 20 MG PO TABS
40.0000 mg | ORAL_TABLET | Freq: Every day | ORAL | 0 refills | Status: AC
Start: 2023-12-03 — End: 2023-12-09

## 2023-12-03 MED ORDER — ALBUTEROL SULFATE HFA 108 (90 BASE) MCG/ACT IN AERS
2.0000 | INHALATION_SPRAY | Freq: Four times a day (QID) | RESPIRATORY_TRACT | 0 refills | Status: DC | PRN
Start: 2023-12-03 — End: 2024-02-19

## 2023-12-03 NOTE — Progress Notes (Signed)
 VIRTUAL VISIT NOTE    Patient: Jennifer [name]   Date: 12/03/2023   MRN: [id number]     This visit is being conducted via video.  Patient confirms that currently in [place]  at the time of this visit: yes    Subjective      HPI   Jennifer [name] is a 68 y.o. pt presenting with complaint of persistent cough, wheezing, throat irritation and fatigue. Patient states symptoms have been lingering for over 2 weeks. Patient was seen at our center twice. Chest Xray unremarkable for pneumonia. Patient was prescribed Azithromycin and reports finishing it 2 days ago. Patient also took guaifenesin but cough has not improved and is worsening instead. Patient states she coughs non stop and wheezes. Patient denies nasal congestion, ear ache, fever, myalgia, chills, and SOB.    Has attempted Azithromycin and Guaifenesin prior to visit. Jennifer [name]    Pertinent Past Medical, Surgical, Family and Social History were reviewed.   Current Medications[1]  Allergies[2]  Medications and Allergies reviewed.     Objective   Physical Exam  Constitutional: Alert and Normal appearance. Not in acute distress.Not ill-appearing or toxic-appearing.   HENT: Normocephalic and atraumatic.   Eyes: Conjunctivae not injected.  Respiratory: Normal effort. Able to speak in full sentences.  Neurological: Mental Status: Alert and oriented.  Psychiatric: Mood and Affect normal. Behavior normal.      Labs/Imaging  There were no labs reviewed with this patient during the visit.  There were no x-rays reviewed with this patient during the visit.  Current Inpatient Medications with Last Dose Taken[3]     MDM/Assessment  History physical, labs/studies most consistent with persistent cough and wheezing related to bronchitis  as diagnosis. Patient seen today  for persistent symptoms after antibiotic treatment. Patient presented with wheezing and persistent cough at last visit. Symptoms have worsened, Will start patient on Prednisone  and Albuterol . POC discussed with patient. Follow  up with PCP recommended. Patient verbalized understanding.     Differential diagnosis:   Upper Respiratory Infection, Sinusitis, Pneumonia, Allergic Rhinitis    Encounter Diagnoses   Name Primary?    Follow-up exam Yes    Persistent cough     Wheezing         Plan:  Use Albuterol  and Prednisone  as needed for wheezing and persistent cough  Continue to take Guaifenesin for cough as needed  Follow up with PCP for further evaluation of bronchitis  Go to ER with worsening symptoms.    No orders of the defined types were placed in this encounter.    Requested Prescriptions     Signed Prescriptions Disp Refills    albuterol  sulfate HFA (Proventil  HFA) 108 (90 Base) MCG/ACT inhaler 18 g 0     Sig: Inhale 2 puffs into the lungs every 6 (six) hours as needed for Wheezing    predniSONE  (DELTASONE ) 20 MG tablet 10 tablet 0     Sig: Take 2 tablets (40 mg) by mouth once daily for 5 days     Discussed diagnosis and treatment with patient.  Reviewed warning signs for worsening condition as well as indications for follow-up with pmd, return to the urgent care center or emergency department.  Patient expressed understanding of instructions.    An After Visit Summary was transmitted to the patient via MyChart.         [1]   Current Outpatient Medications:     albuterol  sulfate HFA (Proventil  HFA) 108 (90 Base) MCG/ACT inhaler, Inhale 2 puffs into the lungs every  6 (six) hours as needed for Wheezing, Disp: 18 g, Rfl: 0    clonazePAM  (KlonoPIN ) 1 MG tablet, Take 1 tablet (1 mg) by mouth 3 (three) times daily as needed, Disp: , Rfl:     gabapentin  (NEURONTIN ) 300 MG capsule, TAKE 1 CAPSULE (300 MG) BY MOUTH 3 (THREE) TIMES DAILY FOR 3 DAYS, Disp: 9 capsule, Rfl: 0    guaiFENesin (ROBITUSSIN) 100 MG/5ML oral liquid, Take 15 mLs (300 mg) by mouth 4 (four) times daily as needed for Cough or Congestion, Disp: 180 mL, Rfl: 0    Lacosamide  (Vimpat ) 200 MG Tab, Take by mouth 2 (two) times daily, Disp: , Rfl:     lansoprazole  (PREVACID ) 30 MG  capsule, TAKE 1 CAPSULE (30 MG) BY MOUTH DAILY, Disp: 180 capsule, Rfl: 0    levETIRAcetam  (KEPPRA ) 100 MG/ML oral solution, Take 15 mLs (1,500 mg) by mouth, Disp: , Rfl:     levothyroxine  (SYNTHROID ) 100 MCG tablet, Take 1 tablet (100 mcg) by mouth Once each week on Sunday morning, Disp: 90 tablet, Rfl: 0    levothyroxine  (SYNTHROID ) 88 MCG tablet, Take 1 tablet (88 mcg) by mouth Once every morning except Sunday, Disp: 90 each, Rfl: 1    metoprolol  succinate XL (TOPROL -XL) 25 MG 24 hr tablet, TAKE ONE TABLET BY MOUTH EVERY DAY, Disp: 100 tablet, Rfl: 1    NON FORMULARY, VITAMINS: MVI, calcium , b12, b50, vit D, Disp: , Rfl:     ondansetron  (ZOFRAN -ODT) 4 MG disintegrating tablet, TAKE 1 TABLET (4 MG) BY MOUTH EVERY 8 (EIGHT) HOURS AS NEEDED FOR NAUSEA, Disp: 20 tablet, Rfl: 0    predniSONE  (DELTASONE ) 20 MG tablet, Take 2 tablets (40 mg) by mouth once daily for 5 days, Disp: 10 tablet, Rfl: 0    rosuvastatin  (CRESTOR ) 5 MG tablet, Take 1 tablet (5 mg) by mouth once nightly, Disp: 100 tablet, Rfl: 1    triamterene -hydrochlorothiazide  (DYAZIDE ) 37.5-25 MG per capsule, Take 1 capsule by mouth once every morning, Disp: 100 capsule, Rfl: 1  [2]   Allergies  Allergen Reactions    Gabapentin       Just the 600mg  tablets wheezing   [3]   No current facility-administered medications for this visit.

## 2023-12-03 NOTE — Patient Instructions (Addendum)
 Use Albuterol  and Prednisone  as needed for wheezing and persistent cough  Continue to take Guaifenesin for cough as needed  Follow up with PCP for further evaluation of bronchitis  Go to ER with worsening symptoms.

## 2023-12-09 ENCOUNTER — Encounter (INDEPENDENT_AMBULATORY_CARE_PROVIDER_SITE_OTHER): Payer: Self-pay | Admitting: Orthopaedic Surgery

## 2023-12-09 ENCOUNTER — Ambulatory Visit (INDEPENDENT_AMBULATORY_CARE_PROVIDER_SITE_OTHER): Admitting: Orthopaedic Surgery

## 2023-12-09 ENCOUNTER — Ambulatory Visit (INDEPENDENT_AMBULATORY_CARE_PROVIDER_SITE_OTHER)

## 2023-12-09 VITALS — BP 103/70 | HR 60

## 2023-12-09 DIAGNOSIS — M19011 Primary osteoarthritis, right shoulder: Secondary | ICD-10-CM

## 2023-12-09 DIAGNOSIS — M25511 Pain in right shoulder: Secondary | ICD-10-CM

## 2023-12-09 DIAGNOSIS — M25512 Pain in left shoulder: Secondary | ICD-10-CM

## 2023-12-09 NOTE — Progress Notes (Signed)
 [facility]    Provider: Penne JINNY Public, MD  Date of Exam:  12/09/2023   Patient:  Jennifer [name]  DOB:  Dec 19, 1955    AGE:  68 y.o.  MR#:  [id number]     Jennifer [name] is a pleasant 68 y.o.-year-old who presents today  with a history of shoulder pain.       History of Present Illness  Jennifer [name] is a 68 year old female with a history of epilepsy who presents with bilateral shoulder pain following multiple falls. She was referred by Dr. Lonni for evaluation of her shoulder pain and medication management.    She experiences bilateral shoulder pain, predominantly in the right shoulder, located in the anterior region. The pain worsens with activities such as pushing down and raising her arms. She also experiences a cracking and creaking sensation in the shoulders. The pain has intensified following multiple falls over the past several months.    She attributes her falls to medication, feeling lightheaded, particularly when bending down. Her Keppra  level was elevated, and she adjusted her dosage independently. She has not had a seizure since 2012 and is currently seeking a stable neurologist for ongoing care.       [name] is now here for further evaluation and discussion of treatment options.    For other past medical history, social history, family history and past surgical history please see them listed below.    Problem List: Problem List[1]     Past Medical History:  Medical History[2]    Social History: Social History[3]    Family History: Family History[4]    Past Surgical History:  Past Surgical History[5]    Medications:  Scheduled Meds:  Continuous Infusions:  PRN Meds:.  Current Medications[6]     Allergies:  Allergies[7]    ROS:  Constitutional: No fatigue, fever, weight loss, or weight gain.   Ears, Nose, Mouth & Throat: No sore throat or hearing loss.   Cardiovascular: No chest pain, blood clots, or leg cramps.   Respiratory: No shortness of breath, cough, or difficulty  breathing.   Gastrointestinal: No nausea, vomiting, diarrhea, or loss of appetite.   Genitourinary: No polyuria or kidney disease.   Musculoskeletal: No joint aches, muscle weakness or swelling of joints/body parts other than that mentioned above.  Integumentary: No finger nail changes or skin dryness.   Neurological: No numbness, burning discomfort, or headaches.   Psychiatric: No depression or anxiety.   Endocrine: No increased thirst, change in appetite or thyroid  disease.   Hematologic/Lymphatic: No easy bruising or anemia.     Vitals: BP 103/70   Pulse 60     General: Jennifer [name] was awake, alert, oriented, easily engaged, displayed logical thinking with clear speech and was neat in appearance. Her general appearance was normal, well-developed and well-nourished.    Physical Exam  MUSCULOSKELETAL: Bilateral shoulder forward flexion 125 degrees. Shoulder abduction supple to 90 degrees with scaption. Infraspinatus and supraspinatus strength 5-/5.     Skin Intact  Erythema None present  Swelling None present  Atrophy No identifiable  Other Areas of TTP None  Scapular Dyskinesis Negative  Radial Pulse 2+  DTR and Pathological Reflexes Intact  No lymphadenopathy  Radial/Median/Ulnar Nerves intact to motor and sensory provocation  Hand Perfused with CR <2 sec    Results      RADIOLOGY  Shoulder X-ray: arthritis   AP, Y and Axillary views of the right shoulder were ordered and reviewed on today 's visit. They  reveal normal alignment GH and AC joints. Adequate subacromial space. + sclerotic changes of the GH and AC joints.  + narrowing of the AC joint.  Type 1 acromion on Y view.  Normal alignment and coracoid on Axillary view.      AP, Y and Axillary views of the left shoulder were ordered and reviewed on today 's visit. They reveal normal alignment GH and AC joints. Adequate subacromial space. + sclerotic changes of the GH and AC joints.  + narrowing of the AC joint.  Type 1 acromion on Y view.  Normal alignment and  coracoid on Axillary view.      HBG A1C 5.6  Assessment & Plan  Shoulder pain with arthritis  Chronic right shoulder pain with anterior crepitus and arthritis, exacerbated by falls. No acute injury on x-ray.  - Initiated physical therapy to improve muscle function and range of motion.  - Provided list of therapy locations in [place].  - Consider MRI if physical therapy does not alleviate symptoms.    Seizures  No seizures since 2012. Elevated Keppra  level noted. Frequent neurologist changes at [facility].  - Consider referral to [facility] for consistent neurologist care.       I have asked the patient to avoid activities that exacerbate or worsen their symptoms.  We reviewed the standard conservative and surgical management of their diagnosis as well as the use of medications such as Tylenol  and OTC NSAIDs (at prescription strength dosing) as well as discussed at length alternative options for prescription based anti-inflammatories to treat the condition.  We discussed the limited role of injection therapy in management and its potential role in diagnosis and short term pain relief.  We have also discussed referral for formal Physical Therapy. The potential need for surgical intervention was described in detail including the procedure, risks, rehab and recovery.  All questions were answered to the patient's satisfaction.  The patient will notify my clinic of any changes or worsening of their symptoms during the interim.    1. Number and Complexity of Problems Addressed: E&M:   Acute Complicated    2. Data Reviewed: (need A, B, or C)  B)   New Radiology/Labs ordered  Outside Radiology reports/Labs reviewed  Involve Independent Historian -  husband    3. Risk of Problem/Management: Moderate risk from additional diagnostic testing or treatment   (need 1)  Medication with side effects and interactions discussed  Minor Surgery / Injections with Risk Factors Discussed    Level of Medical  Decision Making: Moderate - Level 4 Visit  Penne DOROTHA Public M.D., FAAOS,  Orthopaedic Surgery and Sports Medicine       [1]   Patient Active Problem List  Diagnosis    Primary osteoarthritis of left knee    Mass of right breast, unspecified quadrant    Ventricular tachyarrhythmia (CMS/HCC)    Hypertriglyceridemia    Hypothyroidism, unspecified type    DDD (degenerative disc disease), lumbosacral    Prediabetes    Class 3 severe obesity with serious comorbidity and body mass index (BMI) of 40.0 to 44.9 in adult, unspecified obesity type    Seizure disorder (CMS/HCC)    SVT (supraventricular tachycardia)    History of colon resection    History of cholecystectomy    History of maternal deep vein thrombosis (DVT)    Morbid obesity with BMI of 45.0-49.9, adult (CMS/HCC)    Morbid obesity (CMS/HCC)   [2]   Past Medical History:  Diagnosis Date  Abnormal vision     glasses    Arrhythmia 2018    SVT (had ablation)    Arthritis     Complication of anesthesia     urinary retention with every joint replacement, BP drop with spinal X1, another time spinal didn't work    Convulsions (CMS/HCC)     epilepsy, last seizure 2010    Deep venous thrombosis of distal lower extremity (CMS/HCC) 2018    after cholecystectomy; left leg    Diverticulitis     takes miralax     Encounter for blood transfusion 2018    Gastric ulceration 2018    GI bleed with requiring 4 units PRBCs per pt    Hyperlipidemia     Hypothyroidism     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    [3]   Social History  Tobacco Use    Smoking status: Never     Passive exposure: Past    Smokeless tobacco: Never   Vaping Use    Vaping status: Never Used   Substance Use Topics    Alcohol use: Not Currently     Comment: rarely- holidays    Drug use: Not Currently   [4]   Family History  Problem Relation Name Age of Onset    Cancer Mother Ronal Skeens         lung    Arthritis Mother Ronal Skeens     Heart disease Mother Ronal Skeens         A fib/  chf    Stroke Mother Ronal Skeens     Heart attack Father Bette Skeens 61    Cancer Son Donnice Gonzales         Thyroid     Cancer Maternal Uncle Larnell Ponto    [5]   Past Surgical History:  Procedure Laterality Date    ADENOIDECTOMY      APPENDECTOMY (OPEN)  2009    ARTHROPLASTY, KNEE, TOTAL Left 02/12/2020    Procedure: LEFT ARTHROPLASTY, KNEE, TOTAL;  Surgeon: Enriqueta Debby LABOR, MD;  Location: [facility];  Service: Orthopedics;  Laterality: Left;    BACK SURGERY  1973    lumbar    CARDIAC ABLATION  2018    SVT    CHOLECYSTECTOMY  2018    COLON SURGERY  2008    resection, perforated colon with colonoscopy    COLONOSCOPY, DIAGNOSTIC (SCREENING)      EGD, BIOPSY N/A 02/06/2023    Procedure: ESOPHAGOGASTRODUODENOSCOPY (EGD), BIOPSY;  Surgeon: Rozanne Jody FERNS, MD;  Location: [facility];  Service: General;  Laterality: N/A;    JOINT REPLACEMENT      bilat hips, L 2005, R 2010, right knee 2015    LAPAROSCOPIC, OMENTOPEXY N/A 04/26/2023    Procedure: ROBOT XI ASSISTED, LAPAROSCOPIC, OMENTOPEXY W/ GASTRIC RESTRICTIVE PROCEDURE;  Surgeon: Marge Charmaine CROME, MD;  Location: [facility];  Service: General;  Laterality: N/A;    LYSIS OF ADHESIONS N/A 04/26/2023    Procedure: LYSIS OF ADHESIONS;  Surgeon: Marge Charmaine CROME, MD;  Location: [facility];  Service: General;  Laterality: N/A;    OTHER SURGICAL HISTORY  2018    IVC filter    ROBOT XI ASSISTED,LAPAROSCOPIC,SLEEVE GASTRECTOMY N/A 04/26/2023    Procedure: ROBOT XI ASSISTED, LAPAROSCOPIC, SLEEVE GASTRECTOMY;  Surgeon: Marge Charmaine CROME, MD;  Location:  MAIN OR;  Service: General;  Laterality: N/A;    SMALL INTESTINE SURGERY  2007    Colon  resection post colonoscopy    SPINE SURGERY  11/26/1971    TONSILLECTOMY     [6]   Current Outpatient Medications:     albuterol  sulfate HFA (Proventil  HFA) 108 (90 Base) MCG/ACT inhaler, Inhale 2 puffs into the lungs every 6 (six) hours as needed for Wheezing, Disp: 18 g, Rfl: 0    clonazePAM   (KlonoPIN ) 1 MG tablet, Take 1 tablet (1 mg) by mouth 3 (three) times daily as needed, Disp: , Rfl:     Lacosamide  (Vimpat ) 200 MG Tab, Take by mouth 2 (two) times daily, Disp: , Rfl:     lansoprazole  (PREVACID ) 30 MG capsule, TAKE 1 CAPSULE (30 MG) BY MOUTH DAILY, Disp: 180 capsule, Rfl: 0    levETIRAcetam  (KEPPRA ) 100 MG/ML oral solution, Take 15 mLs (1,500 mg) by mouth, Disp: , Rfl:     levothyroxine  (SYNTHROID ) 100 MCG tablet, Take 1 tablet (100 mcg) by mouth Once each week on Sunday morning, Disp: 90 tablet, Rfl: 0    levothyroxine  (SYNTHROID ) 88 MCG tablet, Take 1 tablet (88 mcg) by mouth Once every morning except Sunday, Disp: 90 each, Rfl: 1    metoprolol  succinate XL (TOPROL -XL) 25 MG 24 hr tablet, TAKE ONE TABLET BY MOUTH EVERY DAY, Disp: 100 tablet, Rfl: 1    NON FORMULARY, VITAMINS: MVI, calcium , b12, b50, vit D, Disp: , Rfl:     ondansetron  (ZOFRAN -ODT) 4 MG disintegrating tablet, TAKE 1 TABLET (4 MG) BY MOUTH EVERY 8 (EIGHT) HOURS AS NEEDED FOR NAUSEA, Disp: 20 tablet, Rfl: 0    predniSONE  (DELTASONE ) 20 MG tablet, Take 2 tablets (40 mg) by mouth once daily for 5 days, Disp: 10 tablet, Rfl: 0    rosuvastatin  (CRESTOR ) 5 MG tablet, Take 1 tablet (5 mg) by mouth once nightly, Disp: 100 tablet, Rfl: 1    triamterene -hydrochlorothiazide  (DYAZIDE ) 37.5-25 MG per capsule, Take 1 capsule by mouth once every morning, Disp: 100 capsule, Rfl: 1    gabapentin  (NEURONTIN ) 300 MG capsule, TAKE 1 CAPSULE (300 MG) BY MOUTH 3 (THREE) TIMES DAILY FOR 3 DAYS, Disp: 9 capsule, Rfl: 0    guaiFENesin  (ROBITUSSIN) 100 MG/5ML oral liquid, Take 15 mLs (300 mg) by mouth 4 (four) times daily as needed for Cough or Congestion, Disp: 180 mL, Rfl: 0  [7]   Allergies  Allergen Reactions    Gabapentin       Just the 600mg  tablets wheezing

## 2023-12-16 ENCOUNTER — Other Ambulatory Visit

## 2023-12-31 ENCOUNTER — Encounter (INDEPENDENT_AMBULATORY_CARE_PROVIDER_SITE_OTHER): Payer: Self-pay

## 2024-01-01 ENCOUNTER — Ambulatory Visit (INDEPENDENT_AMBULATORY_CARE_PROVIDER_SITE_OTHER): Admitting: Family

## 2024-01-01 ENCOUNTER — Ambulatory Visit (HOSPITAL_BASED_OUTPATIENT_CLINIC_OR_DEPARTMENT_OTHER): Admitting: Licensed Clinical Social Worker

## 2024-01-03 ENCOUNTER — Other Ambulatory Visit (INDEPENDENT_AMBULATORY_CARE_PROVIDER_SITE_OTHER): Payer: Self-pay | Admitting: Orthopaedic Surgery

## 2024-01-03 ENCOUNTER — Telehealth (INDEPENDENT_AMBULATORY_CARE_PROVIDER_SITE_OTHER): Payer: Self-pay

## 2024-01-03 DIAGNOSIS — M25511 Pain in right shoulder: Secondary | ICD-10-CM

## 2024-01-03 DIAGNOSIS — M67911 Unspecified disorder of synovium and tendon, right shoulder: Secondary | ICD-10-CM

## 2024-01-03 NOTE — Telephone Encounter (Signed)
 I called and informed patient via VM that MRI order placed. May call [phone] with any further concern.

## 2024-01-03 NOTE — Telephone Encounter (Addendum)
 Pt called to f/u with fax number for [facility] imaging. Faxed MRI order to [phone]

## 2024-01-03 NOTE — Telephone Encounter (Signed)
 Patient called has been in therapy for 3-4 weeks and thinks pain is getting worse.  Said at the last visit the provider said - Consider MRI if physical therapy does not alleviate symptoms.  Wanted to know if the provider can place one at this time.  I informed her I will send a message to the provide, when placed, I will call her back, voiced understanding and appreciation.

## 2024-01-06 NOTE — Telephone Encounter (Signed)
 Pt called to state that PT has become too painful for her shoulder and would like to refrain from participation until after her MRI and MRI f/u appt.Advised can go to PT and avoid movements that exacerbate pain or stop until after imaging/appts. Pt verbalized understanding.

## 2024-01-13 ENCOUNTER — Other Ambulatory Visit (INDEPENDENT_AMBULATORY_CARE_PROVIDER_SITE_OTHER): Payer: Self-pay | Admitting: Family

## 2024-01-13 ENCOUNTER — Telehealth (INDEPENDENT_AMBULATORY_CARE_PROVIDER_SITE_OTHER): Payer: Self-pay

## 2024-01-13 NOTE — Telephone Encounter (Signed)
 Pt calling to verify if it's ok for her to stop PT at this time because of her increased pain. Pt informed this is fine if she's having increased pain. Continue to MRI review appt and continue PT if indicated after. Pt verbalized understanding.

## 2024-01-20 ENCOUNTER — Encounter (INDEPENDENT_AMBULATORY_CARE_PROVIDER_SITE_OTHER): Payer: Self-pay | Admitting: Orthopaedic Surgery

## 2024-01-24 ENCOUNTER — Other Ambulatory Visit (INDEPENDENT_AMBULATORY_CARE_PROVIDER_SITE_OTHER): Payer: Self-pay | Admitting: Internal Medicine

## 2024-01-24 DIAGNOSIS — Z1231 Encounter for screening mammogram for malignant neoplasm of breast: Secondary | ICD-10-CM

## 2024-01-29 ENCOUNTER — Other Ambulatory Visit (INDEPENDENT_AMBULATORY_CARE_PROVIDER_SITE_OTHER): Payer: Self-pay | Admitting: Internal Medicine

## 2024-01-29 ENCOUNTER — Ambulatory Visit
Admission: RE | Admit: 2024-01-29 | Discharge: 2024-01-29 | Discharge status: Home / Self Care | Source: Ambulatory Visit | Attending: Internal Medicine

## 2024-01-29 DIAGNOSIS — Z1231 Encounter for screening mammogram for malignant neoplasm of breast: Secondary | ICD-10-CM | POA: Insufficient documentation

## 2024-01-29 DIAGNOSIS — G40909 Epilepsy, unspecified, not intractable, without status epilepticus: Secondary | ICD-10-CM

## 2024-01-29 DIAGNOSIS — Z0000 Encounter for general adult medical examination without abnormal findings: Secondary | ICD-10-CM

## 2024-01-29 DIAGNOSIS — M25511 Pain in right shoulder: Secondary | ICD-10-CM

## 2024-01-29 DIAGNOSIS — E039 Hypothyroidism, unspecified: Secondary | ICD-10-CM

## 2024-01-29 DIAGNOSIS — I10 Essential (primary) hypertension: Secondary | ICD-10-CM

## 2024-01-29 DIAGNOSIS — E785 Hyperlipidemia, unspecified: Secondary | ICD-10-CM

## 2024-01-30 ENCOUNTER — Ambulatory Visit (INDEPENDENT_AMBULATORY_CARE_PROVIDER_SITE_OTHER): Payer: Self-pay | Admitting: Internal Medicine

## 2024-01-30 ENCOUNTER — Encounter (INDEPENDENT_AMBULATORY_CARE_PROVIDER_SITE_OTHER): Payer: Self-pay | Admitting: Internal Medicine

## 2024-01-30 NOTE — Progress Notes (Signed)
 Mammogram is normal    Rec:  1. Continue routine annual screening

## 2024-02-03 ENCOUNTER — Ambulatory Visit (INDEPENDENT_AMBULATORY_CARE_PROVIDER_SITE_OTHER): Admitting: Orthopaedic Surgery

## 2024-02-03 ENCOUNTER — Encounter (INDEPENDENT_AMBULATORY_CARE_PROVIDER_SITE_OTHER): Payer: Self-pay | Admitting: Orthopaedic Surgery

## 2024-02-03 VITALS — BP 105/68 | HR 55

## 2024-02-03 DIAGNOSIS — M25511 Pain in right shoulder: Secondary | ICD-10-CM

## 2024-02-03 DIAGNOSIS — M67911 Unspecified disorder of synovium and tendon, right shoulder: Secondary | ICD-10-CM

## 2024-02-03 NOTE — Progress Notes (Signed)
 [facility]    Provider: Penne JINNY Public, MD  Date of Exam:  02/03/2024   Patient:  Jennifer [name]  DOB:  10/24/1955    AGE:  68 y.o.  MR#:  [id number]     AVERILL WINTERS is a pleasant 68 y.o.-year-old who presents today  with a history of shoulder pain.       History of Present Illness  ANDRIENNE HAVENER is a 68 year old female with a full rotator cuff tear and arthritis who presents with worsening right shoulder pain.    She experiences severe right shoulder pain that affects daily activities, such as washing her hair and riding in a car, with increased discomfort during car rides over bumps. An MRI shows a full rotator cuff tear with retraction and significant inflammation, along with moderately advanced arthritis in the shoulder joint. She has undergone surgeries for both hips and knees, finding artificial joints effective. She finds some relief in specific positions while sitting in bed or watching TV.       Flo is now here for further evaluation and discussion of treatment options.    For other past medical history, social history, family history and past surgical history please see them listed below.    Problem List: Problem List[1]     Past Medical History:  Medical History[2]    Social History: Social History[3]    Family History: Family History[4]    Past Surgical History:  Past Surgical History[5]    Medications:  Scheduled Meds:Current Facility-Administered Medications[6]  Continuous Infusions:  PRN Meds:.  Current Medications[7]     Allergies:  Allergies[8]    ROS:  Constitutional: No fatigue, fever, weight loss, or weight gain.   Ears, Nose, Mouth & Throat: No sore throat or hearing loss.   Cardiovascular: No chest pain, blood clots, or leg cramps.   Respiratory: No shortness of breath, cough, or difficulty breathing.   Gastrointestinal: No nausea, vomiting, diarrhea, or loss of appetite.   Genitourinary: No polyuria or kidney disease.   Musculoskeletal: No joint aches, muscle  weakness or swelling of joints/body parts other than that mentioned above.  Integumentary: No finger nail changes or skin dryness.   Neurological: No numbness, burning discomfort, or headaches.   Psychiatric: No depression or anxiety.   Endocrine: No increased thirst, change in appetite or thyroid  disease.   Hematologic/Lymphatic: No easy bruising or anemia.     Vitals: BP 105/68   Pulse (!) 55     General: Mayley was awake, alert, oriented, easily engaged, displayed logical thinking with clear speech and was neat in appearance. Her general appearance was normal, well-developed and well-nourished.    Physical Exam       Skin Intact  Erythema None present  Swelling None present  Atrophy No identifiable  Other Areas of TTP None  Scapular Dyskinesis Negative  Radial Pulse 2+  DTR and Pathological Reflexes Intact  No lymphadenopathy  Radial/Median/Ulnar Nerves intact to motor and sensory provocation  Hand Perfused with CR <2 sec    Results  RADIOLOGY  Shoulder MRI: Full rotator cuff tear with retraction, inflammation, and moderate arthritis  Shoulder X-ray: Moderate arthritis   MRI Shoulder Right WO Contrast  Order: [id number]  Impression    IMPRESSION:  1. Full-thickness tear of the distal supraspinatus tendon with  retraction up to the superior humeral head.  2. Bursal surface fraying and tendinosis of the infraspinatus tendon  with a low-grade interstitial tear of the distal posterior fiber.  3. Tendinosis of the subscapularis and teres minor tendons.  4. Moderate AC joint osteoarthritis.  5. Moderate glenohumeral joint osteoarthritis.    Electronically signed by: Ubaldo JAYSON Riding, DO  Board Certified  Radiologist 01/10/2024 8:51 PM  Narrative    HISTORY:  Chronic right shoulder pain    EXAM:  MR RIGHT SHOULDER WITHOUT IV CONTRAST    COMPARISONS:  No priors are available for comparison.    TECHNIQUE:  Magnetic resonance imaging of the right shoulder without the  administration of intravenous or intra-articular  contrast.  Standardized images were acquired in multiple planes with multiple  pulse sequences.    FINDINGS:  ROTATOR CUFF:  Supraspinatus:  Full-thickness tear of the distal tendon with  retraction of 2 the superior humeral head. No muscular atrophy.    Infraspinatus:  Bursal surface fraying and tendinosis with a low-grade  interstitial tear of the distal posterior fiber. No muscular atrophy.    Teres Minor:  Tendon is thickened with intrasubstance increased signal  characteristic of tendinosis. No high grade tear. No muscular atrophy.    Subscapularis:  Tendon is thickened with intrasubstance increased  signal characteristic of tendinosis. No high grade tear. No muscular  atrophy.    SOFT TISSUES:  No significant soft tissue edema or fluid collection.    BICEPS BRACHII:  Proximal long head biceps tendon is intact and normally located within  the bicipital groove.    AC JOINT:  Moderate osteoarthritis. Fluid is present within the subacromial  subdeltoid bursa/space related to the rotator cuff tear.    GLENOHUMERAL JOINT/LABRUM:  Labrum:  Global degeneration and maceration.    Cartilage:  Moderate generalized chondromalacia.    Joint Fluid:  Small joint effusion.    BONES:  No marrow edema or altered signal. No os acromiale.    Assessment & Plan  Right shoulder full-thickness rotator cuff tear with retraction and moderate glenohumeral osteoarthritis  MRI confirmed a full-thickness rotator cuff tear with retraction and moderate glenohumeral osteoarthritis. Recommended reverse total shoulder arthroplasty for comprehensive management, especially effective in patients over 65, utilizing deltoid muscle for improved function and pain relief.  - Refer to Dr. Charmaine Medley for evaluation and discussion of reverse total shoulder arthroplasty.  - Avoid sling use to prevent stiffness.  - Find comfortable positions to alleviate discomfort until specialist appointment.       I have asked the patient to avoid activities that  exacerbate or worsen their symptoms.  We reviewed the standard conservative and surgical management of their diagnosis as well as the use of medications such as Tylenol  and OTC NSAIDs (at prescription strength dosing) as well as discussed at length alternative options for prescription based anti-inflammatories to treat the condition.  We discussed the limited role of injection therapy in management and its potential role in diagnosis and short term pain relief.  We have also discussed referral for formal Physical Therapy. The potential need for surgical intervention was described in detail including the procedure, risks, rehab and recovery.  All questions were answered to the patient's satisfaction.  The patient will notify my clinic of any changes or worsening of their symptoms during the interim.    1. Number and Complexity of Problems Addressed: E&M:   Acute Complicated    2. Data Reviewed: (need A, B, or C)  A) Independent Radiology interpretation    B)   Outside Radiology reports/Labs reviewed    3. Risk of Problem/Management: Moderate risk from additional diagnostic testing or treatment   (need  1)  Medication with side effects and interactions discussed  Major Surgery without Risk Factors Discussed      Level of Medical Decision Making: Moderate - Level 4 Visit  Penne DOROTHA Public M.D., FAAOS,  Orthopaedic Surgery and Sports Medicine         [1]   Patient Active Problem List  Diagnosis    Primary osteoarthritis of left knee    Mass of right breast, unspecified quadrant    Ventricular tachyarrhythmia (CMS/HCC)    Hypertriglyceridemia    Hypothyroidism, unspecified type    DDD (degenerative disc disease), lumbosacral    Prediabetes    Class 3 severe obesity with serious comorbidity and body mass index (BMI) of 40.0 to 44.9 in adult, unspecified obesity type    Seizure disorder (CMS/HCC)    SVT (supraventricular tachycardia)    History of colon resection    History of cholecystectomy    History of maternal deep  vein thrombosis (DVT)    Morbid obesity with BMI of 45.0-49.9, adult (CMS/HCC)    Morbid obesity (CMS/HCC)   [2]   Past Medical History:  Diagnosis Date    Abnormal vision     glasses    Arrhythmia 2018    SVT (had ablation)    Arthritis     Complication of anesthesia     urinary retention with every joint replacement, BP drop with spinal X1, another time spinal didn't work    Convulsions (CMS/HCC)     epilepsy, last seizure 2010    Deep venous thrombosis of distal lower extremity (CMS/HCC) 2018    after cholecystectomy; left leg    Diverticulitis     takes miralax     Encounter for blood transfusion 2018    Gastric ulceration 2018    GI bleed with requiring 4 units PRBCs per pt    Hyperlipidemia     Hypothyroidism     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    [3]   Social History  Tobacco Use    Smoking status: Never     Passive exposure: Past    Smokeless tobacco: Never   Vaping Use    Vaping status: Never Used   Substance Use Topics    Alcohol use: Not Currently     Comment: rarely- holidays    Drug use: Not Currently   [4]   Family History  Problem Relation Name Age of Onset    Cancer Mother Ronal Skeens         lung    Arthritis Mother Ronal Skeens     Heart disease Mother Ronal Skeens         A fib/ chf    Stroke Mother Ronal Skeens     Heart attack Father Bette Skeens 61    Cancer Son Donnice Ratel         Thyroid     Cancer Maternal Uncle Larnell Ponto    [5]   Past Surgical History:  Procedure Laterality Date    ADENOIDECTOMY      APPENDECTOMY (OPEN)  2009    ARTHROPLASTY, KNEE, TOTAL Left 02/12/2020    Procedure: LEFT ARTHROPLASTY, KNEE, TOTAL;  Surgeon: Enriqueta Debby LABOR, MD;  Location: [facility];  Service: Orthopedics;  Laterality: Left;    BACK SURGERY  1973    lumbar    CARDIAC ABLATION  2018    SVT    CHOLECYSTECTOMY  2018    COLON SURGERY  2008    resection,  perforated colon with colonoscopy    COLONOSCOPY, DIAGNOSTIC (SCREENING)      EGD, BIOPSY N/A 02/06/2023     Procedure: ESOPHAGOGASTRODUODENOSCOPY (EGD), BIOPSY;  Surgeon: Rozanne Jody FERNS, MD;  Location: [facility];  Service: General;  Laterality: N/A;    JOINT REPLACEMENT      bilat hips, L 2005, R 2010, right knee 2015    LAPAROSCOPIC, OMENTOPEXY N/A 04/26/2023    Procedure: ROBOT XI ASSISTED, LAPAROSCOPIC, OMENTOPEXY W/ GASTRIC RESTRICTIVE PROCEDURE;  Surgeon: Marge Charmaine CROME, MD;  Location: [facility];  Service: General;  Laterality: N/A;    LYSIS OF ADHESIONS N/A 04/26/2023    Procedure: LYSIS OF ADHESIONS;  Surgeon: Marge Charmaine CROME, MD;  Location: [facility];  Service: General;  Laterality: N/A;    OTHER SURGICAL HISTORY  2018    IVC filter    ROBOT XI ASSISTED,LAPAROSCOPIC,SLEEVE GASTRECTOMY N/A 04/26/2023    Procedure: ROBOT XI ASSISTED, LAPAROSCOPIC, SLEEVE GASTRECTOMY;  Surgeon: Marge Charmaine CROME, MD;  Location: [facility];  Service: General;  Laterality: N/A;    SMALL INTESTINE SURGERY  2007    Colon resection post colonoscopy    SPINE SURGERY  11/26/1971    TONSILLECTOMY     [6] [7]   Current Outpatient Medications:     acetaminophen  (TYLENOL ) 500 MG tablet, Take 1 tablet (500 mg) by mouth, Disp: , Rfl:     clonazePAM  (KlonoPIN ) 1 MG tablet, Take 1 tablet (1 mg) by mouth 3 (three) times daily as needed, Disp: , Rfl:     Lacosamide  (Vimpat ) 200 MG Tab, Take by mouth 2 (two) times daily, Disp: , Rfl:     lansoprazole  (PREVACID ) 30 MG capsule, TAKE 1 CAPSULE (30 MG) BY MOUTH DAILY, Disp: 180 capsule, Rfl: 0    levETIRAcetam  (KEPPRA ) 100 MG/ML oral solution, Take 15 mLs (1,500 mg) by mouth, Disp: , Rfl:     levothyroxine  (SYNTHROID ) 100 MCG tablet, Take 1 tablet (100 mcg) by mouth Once each week on Sunday morning, Disp: 90 tablet, Rfl: 0    levothyroxine  (SYNTHROID ) 88 MCG tablet, Take 1 tablet (88 mcg) by mouth Once every morning except Sunday, Disp: 90 each, Rfl: 1    metoprolol  succinate XL (TOPROL -XL) 25 MG 24 hr tablet, TAKE ONE TABLET BY MOUTH EVERY DAY, Disp: 100 tablet,  Rfl: 1    NON FORMULARY, VITAMINS: MVI, calcium , b12, b50, vit D, Disp: , Rfl:     rosuvastatin  (CRESTOR ) 5 MG tablet, Take 1 tablet (5 mg) by mouth once nightly, Disp: 100 tablet, Rfl: 1    triamterene -hydrochlorothiazide  (DYAZIDE ) 37.5-25 MG per capsule, Take 1 capsule by mouth once every morning, Disp: 100 capsule, Rfl: 1    albuterol  sulfate HFA (Proventil  HFA) 108 (90 Base) MCG/ACT inhaler, Inhale 2 puffs into the lungs every 6 (six) hours as needed for Wheezing, Disp: 18 g, Rfl: 0    gabapentin  (NEURONTIN ) 300 MG capsule, TAKE 1 CAPSULE (300 MG) BY MOUTH 3 (THREE) TIMES DAILY FOR 3 DAYS, Disp: 9 capsule, Rfl: 0    guaiFENesin  (ROBITUSSIN) 100 MG/5ML oral liquid, Take 15 mLs (300 mg) by mouth 4 (four) times daily as needed for Cough or Congestion, Disp: 180 mL, Rfl: 0    ondansetron  (ZOFRAN -ODT) 4 MG disintegrating tablet, TAKE 1 TABLET (4 MG) BY MOUTH EVERY 8 (EIGHT) HOURS AS NEEDED FOR NAUSEA, Disp: 20 tablet, Rfl: 0  [8]   Allergies  Allergen Reactions    Gabapentin       Just the  600mg  tablets wheezing

## 2024-02-06 ENCOUNTER — Ambulatory Visit (INDEPENDENT_AMBULATORY_CARE_PROVIDER_SITE_OTHER): Admitting: Internal Medicine

## 2024-02-10 ENCOUNTER — Ambulatory Visit (HOSPITAL_BASED_OUTPATIENT_CLINIC_OR_DEPARTMENT_OTHER): Admitting: Licensed Clinical Social Worker

## 2024-02-10 ENCOUNTER — Encounter (INDEPENDENT_AMBULATORY_CARE_PROVIDER_SITE_OTHER): Admitting: Orthopaedic Surgery

## 2024-02-10 NOTE — Progress Notes (Unsigned)
 [facility]    Provider: Charmaine Medley, MD  Date of Exam:  02/10/2024   Patient:  Jennifer [name]  DOB:  [birth year]    AGE:  68 y.o.  MR#:  [id number]     Chief Complaint: Right shoulder pain.    Hand Dominance: {right/left/bi:19544}          Past Medical History:  Medical History[1]    Past Surgical History:  Past Surgical History[2]      PHYSICAL EXAM:     There were no vitals taken for this visit.    General: well-appearing, alert and oriented    Right Shoulder:        Skin: normal  Erythema: none  Swelling: {present absent:40068::absent}  Ecchymosis: none  Atrophy: none  Tenderness to palpation: ***    Active ROM:   Forward Flexion: {cqshoulderrom:50617}  Abduction with scapula stabilized: 70***  ER at side: ***  IR: {cqshoulderir:50616}    Scapular dyskinesia: {present absent:40068}    Passive ROM:  Forward Flexion: {cqshoulderrom:50617}  Abduction with scapula stabilized: 70***  ER at side: ***    Strength:  Supraspinatus: 5***/5  Infraspinatus: 5***/5  Subscapularis: 5***/5    Rotator Cuff Assessment:   Jobe Test: {POSITIVE/NEGATIVE:23097::Negative}  Full Can: {POSITIVE/NEGATIVE:23097::Negative}  Hornblower Test: {POSITIVE/NEGATIVE:23097::Negative}  Lift-off Test: {POSITIVE/NEGATIVE:23097::Negative}  Belly Press: {POSITIVE/NEGATIVE:23097::Negative}  Bear-hug test: {POSITIVE/NEGATIVE:23097::Negative}  Cross-body compression test: {POSITIVE/NEGATIVE:23097::Negative}    Labral/Biceps:  Obrien's Test: {POSITIVE/NEGATIVE:23097::Negative}  Speed's Test: {POSITIVE/NEGATIVE:23097::Negative}  Anterior Apprehension Test: {POSITIVE/NEGATIVE:23097::Negative}  Anterior Relocation Test: {POSITIVE/NEGATIVE:23097::Negative}  Posterior Load and Shift: {POSITIVE/NEGATIVE:23097::Negative}  Kim's Test: {POSITIVE/NEGATIVE:23097::Negative}    Impingement:  Neer Sign: {POSITIVE/NEGATIVE:23097::Negative}  Hawkin's Sign:  {POSITIVE/NEGATIVE:23097::Negative}    Radial Pulse 2+  No lymphadenopathy  Sensation: intact throughout Ax/Median/Radial/MC/Ulnar distributions to light touch    Left Shoulder (unaffected):    Active/Passive ROM:   Full normal motion    Strength:  Full 5/5 strength throughout    Provocative Testing: negative    Radial Pulse 2+  No lymphadenopathy  Sensation: intact throughout Ax/Median/Radial/MC/Ulnar distributions to light touch      Gait: The patient demonstrated a non-antalgic gait with intact coordination and balance.     STUDIES:     XR's of the right  shoulder were performed previously and were personally reviewed by me. They demonstrate severe glenohumeral and AC joint arthritis.    Right shoulder MRI at  (01/29/24):  1. Full-thickness tear of the distal supraspinatus tendon with retraction up to the superior humeral head.   2. Bursal surface fraying and tendinosis of the infraspinatus tendon with a low-grade interstitial tear of the distal posterior fiber.   3. Tendinosis of the subscapularis and teres minor tendons.   4. Moderate AC joint osteoarthritis.   5. Moderate glenohumeral joint osteoarthritis.     OUTSIDE RECORDS  I also had a chance to review personally records from the patient's visit with Dr. Armida in our electronic medical record system.  These records show she was seen on 02/03/24 for right shoulder pain. Given the extend of her arthritis, he referred to Dr. Charmaine Medley for evaluation and discussion of reverse total shoulder arthroplasty.     IMPRESSION    No diagnosis found.    PLAN:      IDamien Lout, LAT, ATC personally provided scribing services for Charmaine Medley, MD for patient Jennifer [name] on February 10, 2024. ***         [1]   Past Medical History:  Diagnosis Date  Abnormal vision     glasses    Arrhythmia 2018    SVT (had ablation)    Arthritis     Complication of anesthesia     urinary retention with every joint replacement, BP drop with spinal X1, another  time spinal didn't work    Convulsions (CMS/HCC)     epilepsy, last seizure 2010    Deep venous thrombosis of distal lower extremity (CMS/HCC) 2018    after cholecystectomy; left leg    Diverticulitis     takes miralax     Encounter for blood transfusion 2018    Gastric ulceration 2018    GI bleed with requiring 4 units PRBCs per pt    Hyperlipidemia     Hypothyroidism     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    [2]   Past Surgical History:  Procedure Laterality Date    ADENOIDECTOMY      APPENDECTOMY (OPEN)  2009    ARTHROPLASTY, KNEE, TOTAL Left 02/12/2020    Procedure: LEFT ARTHROPLASTY, KNEE, TOTAL;  Surgeon: Enriqueta Debby LABOR, MD;  Location: [facility];  Service: Orthopedics;  Laterality: Left;    BACK SURGERY  1973    lumbar    CARDIAC ABLATION  2018    SVT    CHOLECYSTECTOMY  2018    COLON SURGERY  2008    resection, perforated colon with colonoscopy    COLONOSCOPY, DIAGNOSTIC (SCREENING)      EGD, BIOPSY N/A 02/06/2023    Procedure: ESOPHAGOGASTRODUODENOSCOPY (EGD), BIOPSY;  Surgeon: Rozanne Jody FERNS, MD;  Location: [facility];  Service: General;  Laterality: N/A;    JOINT REPLACEMENT      bilat hips, L 2005, R 2010, right knee 2015    LAPAROSCOPIC, OMENTOPEXY N/A 04/26/2023    Procedure: ROBOT XI ASSISTED, LAPAROSCOPIC, OMENTOPEXY W/ GASTRIC RESTRICTIVE PROCEDURE;  Surgeon: Marge Charmaine CROME, MD;  Location: [facility];  Service: General;  Laterality: N/A;    LYSIS OF ADHESIONS N/A 04/26/2023    Procedure: LYSIS OF ADHESIONS;  Surgeon: Marge Charmaine CROME, MD;  Location: [facility];  Service: General;  Laterality: N/A;    OTHER SURGICAL HISTORY  2018    IVC filter    ROBOT XI ASSISTED,LAPAROSCOPIC,SLEEVE GASTRECTOMY N/A 04/26/2023    Procedure: ROBOT XI ASSISTED, LAPAROSCOPIC, SLEEVE GASTRECTOMY;  Surgeon: Marge Charmaine CROME, MD;  Location: [facility];  Service: General;  Laterality: N/A;    SMALL INTESTINE SURGERY  2007    Colon resection post  colonoscopy    SPINE SURGERY  11/26/1971    TONSILLECTOMY

## 2024-02-13 ENCOUNTER — Ambulatory Visit (INDEPENDENT_AMBULATORY_CARE_PROVIDER_SITE_OTHER): Admitting: Internal Medicine

## 2024-02-17 ENCOUNTER — Encounter (INDEPENDENT_AMBULATORY_CARE_PROVIDER_SITE_OTHER): Payer: Self-pay | Admitting: Internal Medicine

## 2024-02-19 ENCOUNTER — Ambulatory Visit (INDEPENDENT_AMBULATORY_CARE_PROVIDER_SITE_OTHER): Admitting: Internal Medicine

## 2024-02-19 ENCOUNTER — Encounter (INDEPENDENT_AMBULATORY_CARE_PROVIDER_SITE_OTHER): Payer: Self-pay | Admitting: Internal Medicine

## 2024-02-19 VITALS — BP 123/72 | HR 52 | Temp 97.3°F | Wt 225.1 lb

## 2024-02-19 DIAGNOSIS — E785 Hyperlipidemia, unspecified: Secondary | ICD-10-CM

## 2024-02-19 DIAGNOSIS — R7309 Other abnormal glucose: Secondary | ICD-10-CM

## 2024-02-19 DIAGNOSIS — I471 Supraventricular tachycardia, unspecified: Secondary | ICD-10-CM

## 2024-02-19 DIAGNOSIS — E039 Hypothyroidism, unspecified: Secondary | ICD-10-CM

## 2024-02-19 DIAGNOSIS — G40909 Epilepsy, unspecified, not intractable, without status epilepticus: Secondary | ICD-10-CM

## 2024-02-19 DIAGNOSIS — Z01818 Encounter for other preprocedural examination: Secondary | ICD-10-CM

## 2024-02-19 DIAGNOSIS — I10 Essential (primary) hypertension: Secondary | ICD-10-CM

## 2024-02-19 LAB — LAB USE ONLY - CBC WITH DIFFERENTIAL
Absolute Basophils: 0.04 x10 3/uL (ref 0.00–0.08)
Absolute Eosinophils: 0.26 x10 3/uL (ref 0.00–0.44)
Absolute Immature Granulocytes: 0.02 x10 3/uL (ref 0.00–0.07)
Absolute Lymphocytes: 1.99 x10 3/uL (ref 0.42–3.22)
Absolute Monocytes: 0.44 x10 3/uL (ref 0.21–0.85)
Absolute Neutrophils: 3.64 x10 3/uL (ref 1.10–6.33)
Absolute nRBC: 0 x10 3/uL (ref <=0.00)
Basophils %: 0.6 %
Eosinophils %: 4.1 %
Hematocrit: 40 % (ref 34.7–43.7)
Hemoglobin: 13.2 g/dL (ref 11.4–14.8)
Immature Granulocytes %: 0.3 %
Lymphocytes %: 31.1 %
MCH: 31.2 pg (ref 25.1–33.5)
MCHC: 33 g/dL (ref 31.5–35.8)
MCV: 94.6 fL (ref 78.0–96.0)
MPV: 11.6 fL (ref 8.9–12.5)
Monocytes %: 6.9 %
Neutrophils %: 57 %
Platelet Count: 288 x10 3/uL (ref 142–346)
Preliminary Absolute Neutrophil Count: 3.64 x10 3/uL (ref 1.10–6.33)
RBC: 4.23 x10 6/uL (ref 3.90–5.10)
RDW: 13 % (ref 11–15)
WBC: 6.39 x10 3/uL (ref 3.10–9.50)
nRBC %: 0 /100{WBCs} (ref <=0.0)

## 2024-02-19 LAB — HEMOGLOBIN A1C
Average Estimated Glucose: 122.6 mg/dL
Hemoglobin A1C: 5.9 % — ABNORMAL HIGH (ref 4.6–5.6)

## 2024-02-19 NOTE — Progress Notes (Signed)
 [facility]          HPI     Chief Complaint   Patient presents with    Pre-op Exam     Right shoulder surgery scheduled on 02/26/2024 with Dr. Ozell Gallop     HPI  Pt is here for a pre-op medical clearance.     Scheduled for right reverse total shoulder arthroplasty 02/26/24 Dr. Brendon    HTN, home BP stable,     SVT, no palp, no cp, no sob,   Compliant with meds,   Saw card Dr. Read last week, cleared for surgery,     Hypothyroidism, weight stable, BM ok   Lab Results   Component Value Date    TSH 1.65 08/05/2023    TSH 4.55 07/12/2022      Seizure d/o, stable with med, no recent activity,   Sees neuro    Abnormal glucose, diet controlled  Lab Results   Component Value Date    HGBA1C 5.9 (H) 02/19/2024      Problem List[1]  Current Medications[2]  Medical History[3]  Past Surgical History[4]  Family History[5]  Social History[6]    Review of Systems   Constitutional:  Negative for appetite change, chills and fever.   Respiratory:  Negative for cough and shortness of breath.    Cardiovascular:  Negative for chest pain and palpitations.   Gastrointestinal:  Negative for abdominal pain, constipation, diarrhea, nausea and vomiting.   Genitourinary:  Negative for dysuria.   Hematological:  Does not bruise/bleed easily.      No prior problem with anesthesia    Objective   BP 123/72 (BP Site: Left arm, Patient Position: Sitting, Cuff Size: Medium)   Pulse (!) 52   Temp 97.3 F (36.3 C) (Oral)   Wt 102.1 kg (225 lb 1.4 oz)   BMI 41.84 kg/m   Physical Exam  Vitals reviewed.   Constitutional:       General: She is not in acute distress.     Appearance: She is well-developed.   Eyes:      General: No scleral icterus.     Conjunctiva/sclera: Conjunctivae normal.   Neck:      Thyroid : No thyromegaly.      Vascular: No carotid bruit or JVD.   Cardiovascular:      Rate and Rhythm: Normal rate and regular rhythm.      Heart sounds: Normal heart sounds. No murmur heard.     No friction rub. No gallop.    Pulmonary:      Effort: Pulmonary effort is normal. No respiratory distress.      Breath sounds: Normal breath sounds. No rales.   Abdominal:      General: Bowel sounds are normal. There is no distension.      Palpations: Abdomen is soft.      Tenderness: There is no abdominal tenderness. There is no guarding or rebound.   Musculoskeletal:         General: Tenderness present.      Cervical back: Neck supple.      Right lower leg: No edema.      Left lower leg: No edema.   Neurological:      Mental Status: She is alert.         Assessment/Plan     Assessment & Plan  Pre-op examination    Orders:    CBC with Differential (Order); Future    Basic Metabolic Panel; Future  PT/APTT; Future    Urinalysis with Microscopic Exam; Future    Hypertension, unspecified type         Seizure disorder (CMS/HCC)         Hyperlipidemia LDL goal <100         Hypothyroidism (acquired)    Orders:    TSH; Future    Abnormal glucose    Orders:    Hemoglobin A1C; Future    Cleared for surgery  Hold aspirin  x 1 wk prior to surgery  Continue meds  F/u 6 months.        [1]   Patient Active Problem List  Diagnosis    Primary osteoarthritis of left knee    Mass of right breast, unspecified quadrant    Ventricular tachyarrhythmia (CMS/HCC)    Hypertriglyceridemia    Hypothyroidism, unspecified type    DDD (degenerative disc disease), lumbosacral    Prediabetes    Class 3 severe obesity with serious comorbidity and body mass index (BMI) of 40.0 to 44.9 in adult, unspecified obesity type    Seizure disorder (CMS/HCC)    SVT (supraventricular tachycardia)    History of colon resection    History of cholecystectomy    History of maternal deep vein thrombosis (DVT)    Morbid obesity with BMI of 45.0-49.9, adult (CMS/HCC)    Morbid obesity (CMS/HCC)   [2]   Current Outpatient Medications   Medication Sig Dispense Refill    acetaminophen  (TYLENOL ) 500 MG tablet Take 1 tablet (500 mg) by mouth      aspirin  EC 325 MG tablet Take 1 tablet (325 mg) by  mouth once daily      clonazePAM  (KlonoPIN ) 1 MG tablet Take 1 tablet (1 mg) by mouth 3 (three) times daily as needed      Lacosamide  (Vimpat ) 200 MG Tab Take by mouth 2 (two) times daily      lansoprazole  (PREVACID ) 30 MG capsule TAKE 1 CAPSULE (30 MG) BY MOUTH DAILY 180 capsule 0    levETIRAcetam  (KEPPRA ) 100 MG/ML oral solution Take 15 mLs (1,500 mg) by mouth      levothyroxine  (SYNTHROID ) 100 MCG tablet Take 1 tablet (100 mcg) by mouth Once each week on Sunday morning 90 tablet 0    levothyroxine  (SYNTHROID ) 88 MCG tablet Take 1 tablet (88 mcg) by mouth Once every morning except Sunday 90 each 1    metoprolol  succinate XL (TOPROL -XL) 25 MG 24 hr tablet TAKE ONE TABLET BY MOUTH EVERY DAY 100 tablet 1    NON FORMULARY VITAMINS: MVI, calcium , b12, b50, vit D      rosuvastatin  (CRESTOR ) 5 MG tablet Take 1 tablet (5 mg) by mouth once nightly 100 tablet 1    triamterene -hydrochlorothiazide  (DYAZIDE ) 37.5-25 MG per capsule Take 1 capsule by mouth once every morning 100 capsule 1    albuterol  sulfate HFA (Proventil  HFA) 108 (90 Base) MCG/ACT inhaler Inhale 2 puffs into the lungs every 6 (six) hours as needed for Wheezing 18 g 0    gabapentin  (NEURONTIN ) 300 MG capsule TAKE 1 CAPSULE (300 MG) BY MOUTH 3 (THREE) TIMES DAILY FOR 3 DAYS 9 capsule 0    guaiFENesin  (ROBITUSSIN) 100 MG/5ML oral liquid Take 15 mLs (300 mg) by mouth 4 (four) times daily as needed for Cough or Congestion 180 mL 0    ondansetron  (ZOFRAN -ODT) 4 MG disintegrating tablet TAKE 1 TABLET (4 MG) BY MOUTH EVERY 8 (EIGHT) HOURS AS NEEDED FOR NAUSEA 20 tablet 0     No current  facility-administered medications for this visit.   [3]   Past Medical History:  Diagnosis Date    Abnormal vision     glasses    Arrhythmia 2018    SVT (had ablation)    Arthritis     Complication of anesthesia     urinary retention with every joint replacement, BP drop with spinal X1, another time spinal didn't work    Convulsions (CMS/HCC)     epilepsy, last seizure 2010    Deep  venous thrombosis of distal lower extremity (CMS/HCC) 2018    after cholecystectomy; left leg    Diverticulitis     takes miralax     Encounter for blood transfusion 2018    Gastric ulceration 2018    GI bleed with requiring 4 units PRBCs per pt    Hyperlipidemia     Hypothyroidism     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    [4]   Past Surgical History:  Procedure Laterality Date    ADENOIDECTOMY      APPENDECTOMY (OPEN)  2009    ARTHROPLASTY, KNEE, TOTAL Left 02/12/2020    Procedure: LEFT ARTHROPLASTY, KNEE, TOTAL;  Surgeon: Enriqueta Debby LABOR, MD;  Location: [facility];  Service: Orthopedics;  Laterality: Left;    BACK SURGERY  1973    lumbar    CARDIAC ABLATION  2018    SVT    CHOLECYSTECTOMY  2018    COLON SURGERY  2008    resection, perforated colon with colonoscopy    COLONOSCOPY, DIAGNOSTIC (SCREENING)      EGD, BIOPSY N/A 02/06/2023    Procedure: ESOPHAGOGASTRODUODENOSCOPY (EGD), BIOPSY;  Surgeon: Rozanne Jody FERNS, MD;  Location: [facility];  Service: General;  Laterality: N/A;    JOINT REPLACEMENT      bilat hips, L 2005, R 2010, right knee 2015    LAPAROSCOPIC, OMENTOPEXY N/A 04/26/2023    Procedure: ROBOT XI ASSISTED, LAPAROSCOPIC, OMENTOPEXY W/ GASTRIC RESTRICTIVE PROCEDURE;  Surgeon: Marge Charmaine CROME, MD;  Location: [facility];  Service: General;  Laterality: N/A;    LYSIS OF ADHESIONS N/A 04/26/2023    Procedure: LYSIS OF ADHESIONS;  Surgeon: Marge Charmaine CROME, MD;  Location: [facility];  Service: General;  Laterality: N/A;    OTHER SURGICAL HISTORY  2018    IVC filter    ROBOT XI ASSISTED,LAPAROSCOPIC,SLEEVE GASTRECTOMY N/A 04/26/2023    Procedure: ROBOT XI ASSISTED, LAPAROSCOPIC, SLEEVE GASTRECTOMY;  Surgeon: Marge Charmaine CROME, MD;  Location: [facility];  Service: General;  Laterality: N/A;    SMALL INTESTINE SURGERY  2007    Colon resection post colonoscopy    SPINE SURGERY  11/26/1971    TONSILLECTOMY     [5]   Family History  Problem  Relation Name Age of Onset    Cancer Mother Ronal Skeens         lung    Arthritis Mother Ronal Skeens     Heart disease Mother Ronal Skeens         A fib/ chf    Stroke Mother Ronal Skeens     Heart attack Father Bette Skeens 61    Cancer Son Donnice Ratel         Thyroid     Cancer Maternal Uncle Larnell Ponto    [6]   Social History  Tobacco Use    Smoking status: Never     Passive exposure: Past    Smokeless tobacco: Never  Vaping Use    Vaping status: Never Used   Substance Use Topics    Alcohol use: Not Currently     Comment: rarely- holidays    Drug use: Not Currently

## 2024-02-19 NOTE — Progress Notes (Signed)
 Have you seen any specialists/other providers since your last visit with us ?    No    Health Maintenance Due   Topic Date Due    Advance Directive on File  Never done    Influenza Vaccine  12/27/2023    COVID-19 Vaccine (7 - 2024-25 season) 01/27/2024

## 2024-02-20 ENCOUNTER — Ambulatory Visit (INDEPENDENT_AMBULATORY_CARE_PROVIDER_SITE_OTHER): Payer: Self-pay | Admitting: Internal Medicine

## 2024-02-20 LAB — BASIC METABOLIC PANEL
Anion Gap: 12 (ref 5.0–15.0)
BUN: 12 mg/dL (ref 7–21)
CO2: 29 meq/L (ref 17–29)
Calcium: 9.5 mg/dL (ref 8.5–10.5)
Chloride: 101 meq/L (ref 99–111)
Creatinine: 0.8 mg/dL (ref 0.4–1.0)
GFR: >60 mL/min/1.73 m2 (ref >=60.0)
Glucose: 81 mg/dL (ref 70–100)
Hemolysis Index: 3 {index}
Potassium: 4.1 meq/L (ref 3.5–5.3)
Sodium: 142 meq/L (ref 135–145)

## 2024-02-20 LAB — URINALYSIS WITH MICROSCOPIC EXAM
Urine Bilirubin: NEGATIVE
Urine Blood: NEGATIVE
Urine Glucose: NEGATIVE
Urine Ketones: NEGATIVE mg/dL
Urine Leukocyte Esterase: NEGATIVE
Urine Nitrite: NEGATIVE
Urine Protein: NEGATIVE
Urine Specific Gravity: 1.009 (ref 1.001–1.035)
Urine Urobilinogen: NORMAL mg/dL (ref 0.2–2.0)
Urine pH: 7 (ref 5.0–8.0)

## 2024-02-20 LAB — PT AND APTT
INR: 1.1 (ref 0.9–1.1)
PT: 12.3 s (ref 10.1–12.9)
PTT: 36 s (ref 27–39)

## 2024-02-20 LAB — TSH: TSH: 2.95 u[IU]/mL (ref 0.35–4.94)

## 2024-02-20 NOTE — Progress Notes (Signed)
 Hba1c is borderline high = pre diabetes  Thyroid  level is normal  Rest of labs are normal    Rec:  1. Low carb, low cholesterol, low calorie diet, exercise, weight reduction 2. Continue meds  3. Cleared for surgery.

## 2024-02-23 ENCOUNTER — Other Ambulatory Visit (INDEPENDENT_AMBULATORY_CARE_PROVIDER_SITE_OTHER): Payer: Self-pay | Admitting: Internal Medicine

## 2024-02-23 DIAGNOSIS — E039 Hypothyroidism, unspecified: Secondary | ICD-10-CM

## 2024-03-02 NOTE — Preadmission Screening Note (Signed)
 Physical Medicine and Rehabilitation  Clinical Liaison Preadmission Screening    [name] Name:  Jennifer [name]       Medical Record Number: [id number]     Probable Impairment Group Code: 0002.1 - Brain Dysfunction: Non-Traumatic    Demographics  Date of Birth: 1955-06-05  Age: 68 y.o.  Sex: Female   Contact Person: spouse, Jennifer [name], [phone]     Past Medical History:     Past Medical History:   Diagnosis Date    Abnormal vision     glasses    Arrhythmia 2018    SVT (had ablation)    Arthritis     Complication of anesthesia     urinary retention with every joint replacement, BP drop with spinal X1, another time spinal didn't work    Convulsions (CMS/HCC)     epilepsy, last seizure 2010    Deep venous thrombosis of distal lower extremity (CMS/HCC) 2018    after cholecystectomy; left leg    Diverticulitis     takes miralax     Encounter for blood transfusion 2018    Gastric ulceration 2018    GI bleed with requiring 4 units PRBCs per pt    Hyperlipidemia     Hypothyroidism     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                                       Migraines                                                  SVT (supraventricular tachycardia)                         H/O joint replacement                                      Deficiency anemia                                            Past Surgical History:     Past Surgical History:   Procedure Laterality Date    ADENOIDECTOMY      APPENDECTOMY (OPEN)  2009    ARTHROPLASTY, KNEE, TOTAL Left 02/12/2020    Procedure: LEFT ARTHROPLASTY, KNEE, TOTAL;  Surgeon: Enriqueta Debby DELENA, MD;  Location: [facility];  Service: Orthopedics;  Laterality: Left;    BACK SURGERY  1973    lumbar    CARDIAC ABLATION  2018    SVT    CHOLECYSTECTOMY  2018    COLON SURGERY  2008    resection, perforated colon with colonoscopy    COLONOSCOPY, DIAGNOSTIC (SCREENING)      EGD, BIOPSY N/A 02/06/2023    Procedure: ESOPHAGOGASTRODUODENOSCOPY (EGD), BIOPSY;   Surgeon: Rozanne Jody FERNS, MD;  Location: [facility];  Service: General;  Laterality: N/A;    JOINT REPLACEMENT      bilat hips, L 2005, R 2010, right knee 2015    LAPAROSCOPIC, OMENTOPEXY N/A 04/26/2023  Procedure: ROBOT XI ASSISTED, LAPAROSCOPIC, OMENTOPEXY W/ GASTRIC RESTRICTIVE PROCEDURE;  Surgeon: Marge Charmaine CROME, MD;  Location: [facility];  Service: General;  Laterality: N/A;    LYSIS OF ADHESIONS N/A 04/26/2023    Procedure: LYSIS OF ADHESIONS;  Surgeon: Marge Charmaine CROME, MD;  Location: [facility];  Service: General;  Laterality: N/A;    OTHER SURGICAL HISTORY  2018    IVC filter    ROBOT XI ASSISTED,LAPAROSCOPIC,SLEEVE GASTRECTOMY N/A 04/26/2023    Procedure: ROBOT XI ASSISTED, LAPAROSCOPIC, SLEEVE GASTRECTOMY;  Surgeon: Marge Charmaine CROME, MD;  Location: [facility];  Service: General;  Laterality: N/A;    SMALL INTESTINE SURGERY  2007    Colon resection post colonoscopy    SPINE SURGERY  11/26/1971    TONSILLECTOMY     HIP REPLACEMENT                                              Comments: bil  KNEE REPLACEMENT                                             Comments: right knee  COLON RESECTION                                            CHOLECYSTECTOMY                                            LUMBAR LAMINECTOMY                                         COLONOSCOPY                                                HAMMER TOE REPAIR                    02/20/2022               Comments: Procedure: CORRECTION, HAMMER TOE, 3 TOES;  Surgeon: Farris Emeline BROCKS, DPM  FOOT/ TOE - REPAIR/ REVISION/ RECON* 02/20/2022               Comments: Procedure: CAPSULOTOMY, MTP JOINT;  Surgeon: Farris Emeline BROCKS, DPM  FOOT/ TOE - REPAIR/ REVISION/ RECON* 02/20/2022               Comments: Procedure: second and third metatarsal Weil osteotomies;  Surgeon:               Farris Emeline BROCKS, DPM  FOOT/ TOE-ARTHRODESIS                02/20/2022  Comments: Procedure: FUSION, MTP JOINT,  FIRST;  Surgeon: Farris Emeline BROCKS, DPM  AUTOGENOUS BONE HARVEST FOR AUTOGRA* 02/20/2022               Comments: Procedure: Calcaneus bone marrow aspiration;  Surgeon: Farris Emeline BROCKS, DPM  RIGHT SHOULDER REPLACEMENT 02/26/24  Prior Level of Functioning:  Mobility  Rolling: Independent (without device)  Supine to sit: Independent (without device)  Sit to stand: Independent (without device)  Transfers out of bed: Independent (without device)  Transfers to toilet: Independent (without device)  Ambulation: Independent (without device)    Activities of Daily Living  Eating: Independent (without device)  Grooming: Independent (without device)  Bathing upper body: Independent (without device)  Bathing lower body: Independent (without device)  Dressing upper body: Independent (without device)  Dressing lower body: Independent (without device)  Toileting: Independent (without device)    CARE Tool Items:   Self-Care: Independent  Indoor Mobility (Ambulation): Independent  Stairs: Independent  Cognition: Independent  Devices: None of Jennifer given options  Swallowing/Nutritional Status: Regular food  Has Jennifer [name] had two or more falls in Jennifer past year or any falls with injury in Jennifer past year?: Yes  Did Jennifer [name] have major surgery during Jennifer 100 days prior to admission?: Yes    Social History  Marital Status: married   Does [name] have children?: Yes   How many children does Jennifer [name] have?: 3  Where do Jennifer children reside?: 2 local (pt lives with dtr's family), [place] on [place]  Employment status: Retired Solicitor activities/hobbies: n/a  Additional social history: [name] was independent  Pre-hospital living environment:   Lives With: Garment/textile technologist, dtr, son in law and grandchild  Type of Home: House (11 STE basement quarters 5+6 stairs down)  Home Layout: Basement  Bathroom Shower/Tub: Walk-in Music therapist: Grab bars in shower, Buyer, retail: None  Cooking and shopping:  spouse  Not driving  Spouse will assist [name] after rehab.  Language: English  Hand dominance: right    Jennifer following information was gathered for consideration and maintenance in Jennifer medical record to substantiate medical necessity for an IRF level of care.    This assessment is being completed by Jennifer MARLA Bari, RN. Jennifer [name] is currently at [facility] SURG [phone] CM Harlene Revering [phone]. Jennifer [name] is being referred and recommended by their physician, Jennifer Mend, MD, to be assessed both medically and functionally in regard to their premorbid functional capacity to determine whether they can benefit from a rehabilitation level of care offered by our facility. Jennifer following is information regarding Jennifer medical complexity and clinical risk factors that need to be considered for Jennifer appropriate management of Jennifer [name] care and recovery.     Impairment Group Code: 0002.1 - Brain Dysfunction: Non-Traumatic    Jennifer interdisciplinary team will also manage Jennifer potential risks and complications from Jennifer following comorbid conditions.    Comorbid Conditions: Hypothyroidism, Pain, and Other: HLD, anemia, Hx migraines, Epilepsy, obesity,  Hx DVT, s/p Right shoulder replacement, polypharmacy, hypokalemia, constipation    History of Present Illness:   Jennifer [name] is a 68 yo female with PMH of epilepsy, HLD, hypothyroidism, DVT, s/p IVD filter, migraines, SVT, iron deficiency anemia, knee and hip replacements, colon resection, lumbar laminectomy, adverse anesthesia effects, who presented to [facility] on 02/28/24 after mechanical fall in shower.  [name] had  recent right shoulder replacement on 02/26/24 (NWB to RUE with sling) and accidentally overdosed on her medications. [name] stated she felt like she might have been having aura that she sees prior to her having a seizure.    CT head showed no acute abnormalities. X ray of right shoulder showed: Postsurgical changes of  right total shoulder arthroplasty. Soft tissue gas along Jennifer right shoulder joint is favored to be postsurgical related. No acute displaced fracture or malalignment. Xray of right arm showed no acute findings.     Neurology was consulted. Ataxia attributed to due to medication overdosage versus other causes. [name] also is on clonazepam  1 mg 3 times daily and gabapentin  300 mg 4 times daily  MRI of Jennifer brain negative for acute abnormality.   Continue Keppra  1500 mg twice daily per home dosage  Continue lacosamide  200 mg 2 times daily per home dosage    [name] presents with unsteady gait, impaired balance, decreased strength, impaired functional mobility, decreased activity tolerance, orthopedic restrictions, and decreased ROM s/p R reverse TSA with 3 subsequent falls at home since 10/1.  Jennifer [name] has been working well with therapy services to include PT and OT to address functional mobility, self care deficit, and pain.  Jennifer [name] would benefit from an intensive level of rehab along with close medical management of multiple medical conditions and rehab MD to drive Jennifer rehab process.  Jennifer [name] is medically stable for IRF level of care.    Date of Onset: 02/28/24  Date Admitted to Acute: 02/28/24  Current Precautions: risk for falls and weight-bearing restrictions - upper extremity NSB RUE  Allergies: No Known Allergies     Present Symptoms Summary  Vitals:   Last Filed Vital Signs  - documented in this encounter   Last Filed Vital Signs  Vital Sign Reading Time Taken Comments   Blood Pressure 106/49 03/03/2024 8:47 AM EDT     Pulse 71 03/03/2024 8:47 AM EDT     Temperature 36.6 C (97.8 F) 03/03/2024 8:47 AM EDT     Respiratory Rate 18 03/03/2024 8:47 AM EDT     Oxygen Saturation 97% 03/03/2024 8:47 AM EDT     Inhaled Oxygen Concentration - -     Weight 97 kg (213 lb 13.5 oz) 02/29/2024 7:00 PM EDT     Height 160 cm (5' 3) 02/29/2024 7:00 PM EDT     Body Mass Index 37.88 02/29/2024 7:00 PM EDT      Is  [name] hard of hearing: No  Hearing aid usage: no hearing aids  Does [name] have vision issues: Wears glasses  Code Status: FULL CODE    Diet:   Solid Texture/Consistency: IDDSI Level 7 - Regular  Liquid Texture/Consistency: IDDSI Level 0 - Thin  Therapeutic Type: Heart Healthy and Other: low fat  Bladder: Continent  Last Bowel Movement: 10/7   Bowel: Continent constipation  Integumentary: Surgical site: Right shoulder  Cardiopulmonary: Room air  Dialysis: No    Medication:   Active and Recently Administered Medications  - documented in this encounter   Active and Recently Administered Medications - Legend  Legend  Given Missed Canceled Entry Held Due Other Actions     Time (Time) Time Time Time-Action      Active and Recently Administered Medications  - documented in this encounter   Active and Recently Administered Medications - Legend  Legend  Given Missed Canceled Entry Held Due Other Actions     Time (Time) Time Time Time-Action  Active and Recently Administered Medications - Scheduled  Scheduled  Medication Order 03/01/2024 03/02/2024 03/03/2024   aspirin  tablet 325 mg  325 mg, Oral, Daily, First dose on Sat 02/29/24 at 0900, Until Discontinued    1041   0903   0853           1041  1140  1717   0903  (1255)  1838      clonazePAM  (KlonoPIN ) tablet 1 mg  1 mg, Oral, Daily, First dose (after last modification) on Tue 03/03/24 at 0900, Until Discontinued      (0853)     docusate sodium  (Colace) capsule 100 mg  100 mg, Oral, Daily, First dose on Mon 03/02/24 at 1400, Until Discontinued     1549   0853           0052  0505  1142  1832   0107  0504  (1256)  1800      heparin injection 5,000 Units  5,000 Units, Subcutaneous, EVERY 8 HOURS (0600,1400,2200), First dose on Fri 02/28/24 at 2200, Until Discontinued    0505  1350  2026   0505  1413  2207   0540  1400  2200     lacosamide  (Vimpat ) tablet 200 mg  200 mg, Oral, 2 TIMES DAILY, First dose on Fri 02/28/24 at 2200, Until Discontinued    1041  2025   0903  2019    0853  2100     levETIRAcetam  (Keppra ) oral soln 1,500 mg  1,500 mg, Oral, 2 TIMES DAILY, First dose on Fri 02/28/24 at 2200, Until Discontinued    1139  2024   0907  2200   0857  2100     levothyroxine  (Synthroid ) tablet 100 mcg  100 mcg, Oral, EVERY MORNING, First dose on Sat 02/29/24 at 0600, Until Discontinued    0505   0504   0540     LORazepam (Ativan) tablet 2 mg  2 mg, Oral, ON CALL, 1 dose, Starting on Sun 03/01/24 at 1417, Until Discontinued         metoprolol  XL (Toprol  XL) tablet 25 mg  25 mg, Oral, Daily, First dose on Sat 02/29/24 at 0900, Until Discontinued    1041   0903   0853     omeprazole (PriLOSEC) capsule 20 mg  20 mg, Oral, Daily, First dose on Sat 02/29/24 at 0900, Until Discontinued    1041   0903   0853     polyethylene glycol (Miralax ) packet 17 g  17 g, Oral, Daily, First dose on Mon 03/02/24 at 1500, Until Discontinued     1549   0852     rosuvastatin  (Crestor ) tablet 5 mg  5 mg, Oral, EVERY NIGHT AT BEDTIME, First dose on Fri 02/28/24 at 2100, Until Discontinued    2025   2019   2100     triamterene -hctz (Dyazide ) 37.5-25 mg 1 Cap  1 Cap, Oral, Daily, First dose on Sat 02/29/24 at 0900, Until Discontinued    1042   0903   0857       Active and Recently Administered Medications - PRN  PRN  Medication Order 03/01/2024 03/02/2024 03/03/2024   acetaminophen  (TylenoL ) tablet 650 mg  650 mg, Oral, EVERY 4 HOURS PRN, Starting on Fri 02/28/24 at 2034, Until Discontinued, Temperature GREATER than 100.5 F         acetaminophen  (TylenoL ) tablet 650 mg  650 mg, Oral, EVERY 4 HOURS PRN, Starting on Fri 02/28/24 at 2034, Until  Discontinued, Mild Pain (Pain Score 1-3)         diphenhydrAMINE  (BenadryL ) capsule 25 mg  25 mg, Oral, Nightly PRN, Starting on Fri 02/28/24 at 2100, Until Discontinued, Insomnia (2nd Line)         magnesium  oxide (Mag-Ox) tablet 400-800 mg  400-800 mg, Oral, PRN, Starting on Fri 02/28/24 at 2034, Until Discontinued, See System Magnesium  Replacement Protocol link         magnesium  sulfate  1g/D5W ivpb 1 g  1 g, at 200 mL/hr, IVPB, PRN, Starting on Fri 02/28/24 at 2034, Until Discontinued, See System Magnesium  Replacement Protocol link         magnesium  sulfate 2g/50ml (4%) in water  IVPB 2 g  2 g, at 50 mL/hr, IVPB, PRN, Starting on Fri 02/28/24 at 2034, Until Discontinued, See System Magnesium  Replacement Protocol link         metoCLOPramide (Reglan) injection 10 mg(Linked Group 1)  10 mg, Intravenous, EVERY 6 HOURS PRN, Starting on Fri 02/28/24 at 2034, Until Discontinued, Nausea/Vomiting (2nd Line)         metoCLOPramide (Reglan) tablet 10 mg(Linked Group 1)  10 mg, Oral, EVERY 6 HOURS PRN, Starting on Fri 02/28/24 at 2034, Until Discontinued, Nausea/Vomiting (2nd Line)         morphine  injection 2 mg  2 mg, Intravenous, EVERY 4 HOURS PRN, Starting on Fri 02/28/24 at 2034, Until Discontinued, Mild Pain (Pain Score 1-3)    1048       morphine  injection 3 mg  3 mg, IV Push, EVERY 4 HOURS PRN, Starting on Fri 02/28/24 at 2034, Until Discontinued, Moderate Pain (Pain Score 4-6)         morphine  injection 4 mg  4 mg, IV Push, EVERY 4 HOURS PRN, Starting on Fri 02/28/24 at 2034, Until Discontinued, Severe Pain (Pain Score 7-10)     2222      nitroglycerin (Nitrostat) tablet 0.4 mg  0.4 mg, Sublingual, EVERY 5 MIN PRN, Starting on Fri 02/28/24 at 2034, Until Discontinued, Chest Pain         ondansetron  (PF) (Zofran ) injection 4 mg(Linked Group 2)  4 mg, Intravenous, EVERY 6 HOURS PRN, Starting on Fri 02/28/24 at 2034, Until Discontinued, Nausea/Vomiting (1st Line)         ondansetron  (Zofran ) tablet 4 mg(Linked Group 2)  4 mg, Oral, EVERY 6 HOURS PRN, Starting on Fri 02/28/24 at 2034, Until Discontinued, Nausea/Vomiting (1st Line)         oxyCODONE  (Roxicodone ) tablet 10 mg  10 mg, Oral, EVERY 4 HOURS PRN, Starting on Fri 02/28/24 at 2034, Until Discontinued, Severe Pain (Pain Score 7-10)         oxyCODONE  (Roxicodone ) tablet 5 mg  5 mg, Oral, EVERY 4 HOURS PRN, Starting on Fri 02/28/24 at 2034, Until  Discontinued, Moderate Pain (Pain Score 4-6)    2228   0504  1254      potassium chloride  (Klor-Con ) packet 20-60 mEq  20-60 mEq, Oral, PRN, Starting on Fri 02/28/24 at 2034, Until Discontinued, See System Potassium Replacement Protocol link    0612       potassium chloride  ER (K-Dur;Klor-Con  M) tablet 20-60 mEq  20-60 mEq, Oral, PRN, Starting on Fri 02/28/24 at 2034, Until Discontinued, See System Potassium Replacement Protocol link         potassium chloride  ER (K-Dur;Klor-Con  M) tablet 20-60 mEq  20-60 mEq, Oral, PRN, Starting on Fri 02/28/24 at 2034, Until Discontinued, See System Potassium Replacement Protocol link  1048       potassium chloride  in water  (KCL) 10 mEq/100 mL IVPB 10 mEq  10 mEq, Intravenous, PRN, Starting on Fri 02/28/24 at 2034, Until Discontinued, See System Potassium Replacement Protocol link         potassium chloride  in water  20 mEq/100 mL infusion 20 mEq  20 mEq, at 100 mL/hr, Intravenous via [employer], PRN, Starting on Fri 02/28/24 at 2034, Until Discontinued, See System Potassium Replacement Protocol link         potassium chloride  in water  20 mEq/50 mL infusion 20 mEq  20 mEq, at 50 mL/hr, Intravenous via Central Line, PRN, Starting on Fri 02/28/24 at 2034, Until Discontinued, See System Potassium Replacement Protocol link         promethazine  (Phenergan ) suppository 12.5 mg(Linked Group 3)  12.5 mg, Rectal, EVERY 6 HOURS PRN, Starting on Fri 02/28/24 at 2034, Until Discontinued, Nausea/Vomiting (3rd Line)         promethazine  (Phenergan ) tablet 12.5 mg(Linked Group 3)  12.5 mg, Oral, EVERY 6 HOURS PRN, Starting on Fri 02/28/24 at 2034, Until Discontinued, Nausea/Vomiting (3rd Line)         sodium chloride  0.9% (NS) FLUSH syringe 10 mL(Linked Group 4)  10 mL, Intravenous, PRN, Starting on Fri 02/28/24 at 2034, Until Discontinued, Line Flush         zolpidem (Ambien) tablet 5 mg  5 mg, Oral, Nightly PRN, Starting on Fri 02/28/24 at 2100, Until Discontinued, Insomnia (1st Line)                Alcohol/Tobacco/Drug Use:  Jennifer [name] reports that she does not currently use alcohol. Jennifer [name] reports that she has never smoked. She has been exposed to tobacco smoke. She has never used smokeless tobacco. Jennifer [name] reports that she does not currently use drugs.    Pain: Yes; acute  Plan: PO meds    Lab Results:   (ABNORMAL) CBC (03/02/2024 4:04 AM EDT)  Lab Results - (ABNORMAL) CBC (03/02/2024 4:04 AM EDT)  Component Value Ref Range Test Method Analysis Time Performed At Pathologist Signature   WBC 8.7 4.0 - 11.0 K/uL   03/02/2024 4:24 AM EDT [facility]     RBC 3.31 (L) 3.80 - 5.20 M/uL   03/02/2024 4:24 AM EDT [facility] [place]  [facility]     HGB 10.4 (L) 11.7 - 16.1 g/dL   89/93/7974 5:75 AM EDT [facility] [place]     HCT 31.6 (L) 35.1 - 48.3 %   03/02/2024 4:24 AM EDT [facility] [place]  [facility]     MCV 96 80 - 99 fL   03/02/2024 4:24 AM EDT [facility] [place]  [facility]     MCH 31 26 - 34 pg   03/02/2024 4:24 AM EDT [facility] [place]  [facility]     MCHC 33 31 - 36 g/dL   89/93/7974 5:75 AM EDT [facility] [place]  [facility]     RDW 12.7 10.0 - 15.5 %   03/02/2024 4:24 AM EDT [facility] [place]  [facility]     Platelet 312 140 - 440 K/uL   03/02/2024 4:24 AM EDT [facility] [place]  [facility]     MPV 11.0 9.0 - 13.0 fL   03/02/2024 4:24 AM EDT [facility] [place]  [facility]       (ABNORMAL) CBC WITH DIFFERENTIAL AUTO (02/29/2024 5:54 AM EDT)  Lab Results - (ABNORMAL) CBC WITH DIFFERENTIAL AUTO (02/29/2024 5:54 AM  EDT)  Component Value Ref Range Test Method Analysis Time Performed At Pathologist Signature   WBC 7.1 4.0 - 11.0 K/uL   02/29/2024 8:07 AM EDT [facility] [place]  [facility]     RBC 3.30 (L) 3.80 - 5.20 M/uL   02/29/2024 8:07 AM EDT [facility] [place]  [facility]     HGB 10.3 (L) 11.7 - 16.1 g/dL    89/95/7974 1:92 AM EDT [facility] [place]  [facility]     HCT 31.1 (L) 35.1 - 48.3 %   02/29/2024 8:07 AM EDT [facility] [place]  [facility]     MCV 94 80 - 99 fL   02/29/2024 8:07 AM EDT [facility] [place]  [facility]     MCH 31 26 - 34 pg   02/29/2024 8:07 AM EDT [facility] [place]     MCHC 33 31 - 36 g/dL   89/95/7974 1:92 AM EDT [facility] [place]  [facility]     RDW 12.7 10.0 - 15.5 %   02/29/2024 8:07 AM EDT [facility] [place]  [facility]     Platelet 273 140 - 440 K/uL   02/29/2024 8:07 AM EDT [facility] [place] [facility]  [facility]     MPV 11.7 9.0 - 13.0 fL   02/29/2024 8:07 AM EDT [facility] [place] [facility]     Segmented Neutrophils (Auto) 70 40 - 75 %   02/29/2024 8:07 AM EDT [facility]  [facility]     Lymphocytes (Auto) 19 (L) 20 - 45 %   02/29/2024 8:07 AM EDT [facility]     Monocytes (Auto) 8 3 - 12 %   02/29/2024 8:07 AM EDT [facility]     Eosinophils (Auto) 3 0 - 6 %   02/29/2024 8:07 AM EDT [facility] [place]  [facility]     Basophils (Auto) 0 0 - 2 %   02/29/2024 8:07 AM EDT [facility] [place]  [facility]     Absolute Neutrophils (Auto) 5.0 1.8 - 7.7 K/uL   02/29/2024 8:07 AM EDT [facility] [place]  [facility]     Absolute Lymphocytes (Auto) 1.4 1.0 - 4.8 K/uL   02/29/2024 8:07 AM EDT [facility] [place]  [facility]     Absolute Monocytes (Auto) 0.6 0.1 - 1.0 K/uL   02/29/2024 8:07 AM EDT [facility] [place]  [facility]     Absolute Eosinophils (Auto) 0.2 0.0 - 0.5 K/uL   02/29/2024 8:07 AM EDT [facility] [place]  [facility]     Absolute Basophils (Auto) 0.0 0.0 - 0.2 K/uL   02/29/2024 8:07 AM EDT [facility] [place]  [facility]      (ABNORMAL) BASIC METABOLIC PANEL (03/03/2024 6:24 AM EDT)  Lab Results - (ABNORMAL) BASIC METABOLIC PANEL (03/03/2024 6:24 AM  EDT)  Component Value Ref Range Test Method Analysis Time Performed At Pathologist Signature   Potassium 4.0 3.5 - 5.5 mmol/L   03/03/2024 7:14 AM EDT [facility] [place]  [facility]     Sodium 140 133 - 145 mmol/L   03/03/2024 7:14 AM EDT [facility] [place]  [facility]     Chloride 101 98 - 110 mmol/L   03/03/2024 7:14 AM EDT [facility] [place]  [facility]     Glucose 112 (H) 70 - 99 mg/dL   89/92/7974 2:85 AM EDT [facility] [place]  [facility]     Calcium  9.3 8.4 - 10.5 mg/dL   89/92/7974 2:85 AM EDT [facility] [place]  [facility]  BUN 16 6 - 22 mg/dL   89/92/7974 2:85 AM EDT [facility] [place]  [facility]     Creatinine 0.6 (L) 0.8 - 1.4 mg/dL   89/92/7974 2:85 AM EDT [facility] [place]  [facility]     CO2 27 20 - 32 mmol/L   03/03/2024 7:14 AM EDT [facility] [place]  [facility]     eGFR >90.0 >60.0 mL/min/1.73 sq.m.   03/03/2024 7:14 AM EDT [facility] [place]  [facility]      (ABNORMAL) BASIC METABOLIC PANEL (03/02/2024 4:04 AM EDT)  Lab Results - (ABNORMAL) BASIC METABOLIC PANEL (03/02/2024 4:04 AM EDT)  Component Value Ref Range Test Method Analysis Time Performed At Pathologist Signature   Potassium 4.0 3.5 - 5.5 mmol/L   03/02/2024 4:46 AM EDT [facility] [place]  [facility]     Sodium 141 133 - 145 mmol/L   03/02/2024 4:46 AM EDT [facility] [place]  [facility]     Chloride 103 98 - 110 mmol/L   03/02/2024 4:46 AM EDT [facility] [place]  [facility]     Glucose 125 (H) 70 - 99 mg/dL   89/93/7974 5:53 AM EDT [facility] [place]  [facility]     Calcium  9.1 8.4 - 10.5 mg/dL   89/93/7974 5:53 AM EDT [facility] [place]  [facility]     BUN 17 6 - 22 mg/dL   89/93/7974 5:53 AM EDT [facility] [place]  [facility]     Creatinine 0.7 (L) 0.8 - 1.4 mg/dL   89/93/7974 5:53 AM EDT [facility] [place]  [facility]     CO2 26 20 - 32 mmol/L   03/02/2024 4:46 AM EDT  [facility]     eGFR >90.0 >60.0 mL/min/1.73 sq.m.   03/02/2024 4:46 AM EDT [id number]    Radiology (past 7 days):    MRI Head without Contrast 03/02/24 1209  IMPRESSION:   1. No acute intracranial abnormality, specifically no evidence of   acute ischemia.   2. Mild white matter T2/FLAIR hyperintensity(s) are present. These can   be seen in symptomatic and asymptomatic patients, but most commonly   related to chronic microangiopathy. Less common etiologies include   migraines, demyelinating disease, and vasculitis.     CT Head WO Contrast  Result Date: 02/28/2024  IMPRESSION: No definite acute intracranial hemorrhage or mass effect identified. Electronically signed by: Ami Breeding, MD  Board Certified Radiologist 02/28/2024 6:13 PM    XR Humerus Right AP Lateral  Result Date: 02/28/2024  IMPRESSION: No acute findings. Repeat humerus radiographs are recommended given Jennifer distal humerus is not visualized on Jennifer current exam. 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 Soft tissue gas along Jennifer right shoulder joint is favored to be postsurgical related. No acute displaced fracture or malalignment. Electronically signed by: Selinda Moats  Board Certified Radiologist 02/28/2024 5:35 PM    IVs: Saline lock    [name] is presently participating in rehab.    Adjustment to Present Illness: [name] is coping adequately and [name] is motivated    Activity Tolerance: good    Special needs: Weight bearing restrictions NWB RUE and Limb restriction sling to Right arm    Current Functional Status  Weight-bearing status: R UE NWB    Mobility  Rolling: Maximal assistance  Supine to sit: Moderate assistance  Sit to stand: Moderate assistance  Transfers out of bed: Moderate assistance  Transfers to toilet: Moderate  assistance  Ambulation: Moderate assistance  Ambulation Distance: 10 ft  Assistive device used: hemi walker, Large Base Quad Cane  Pattern: Ataxic gait LOB posterior/laterally requiring MaxA from PT to prevent posterior LOB progressive worsening with B foot clearance, returned to hemi walker however still unable to clear B foot following 10 feet ambulation, required sara steady to return to bed     Activities of Daily Living  Eating: Supervision/Standby assistance  Grooming: Contact guard/minimal assistance  Bathing upper body: Maximal assistance  Bathing lower body: Maximal assistance  Dressing upper body: Maximal assistance  Dressing lower body: Moderate assistance  Toileting: Maximal assistance    Cognition: Alert/oriented and Follows commands  Cognitive deficits: Memory deficits  Communication deficits: None noted at this time  Swallowing deficits: No  Other impairments: Gait abnormality and Ataxia/lack of coordination    Healthcare Decisions:  [name] is able to understand and make healthcare decisions: Yes    Payer Source  Primary: Medicare A&B  Secondary: [employer] Medicare Supp    Rehab risks/benefits reviewed: Yes  [name]/family/caregiver agrees/accepts rehab risks/benefits: Yes  Rehab literature/brochure provided: Yes emailed to [email or web address]    Is Jennifer [name] being considered for an arthritic condition to meet Jennifer 60% regulatory requirement: No      Is Jennifer [name] appropriate for IRF admission (able to tolerate intensive inpatient rehabilitation and has a condition that requires a comprehensive interdisciplinary team)?  Yes   Sheela Mcculley Wonnacott's condition is sufficiently stable to allow active participation in an intensive interdisciplinary inpatient rehabilitation program. TAKEYLA MILLION would benefit from interdisciplinary inpatient rehabilitation provided by a physician, rehab-focused nursing, and a minimum of two rehab therapies which will provide specialized care for Jennifer following  functional deficits: cognition, hydration, leisure skills, mobility, pain management, respiratory, risk of infection, safety risk, self-care management, and skin/wound management    Jennifer recommended interdisciplinary team will be comprised of Jennifer following services: Medical Supervision, 24 Hour Rehabilitation Nursing, Physical Therapy, Occupational Therapy, and Therapeutic Recreation    ABIGAILE ROSSIE is able to tolerate 3 hours of therapy 5 days a week.    Prognosis: good    Level of expected improvement with inpatient rehabilitation stay: Jennifer [name] will likely progress with course of intensive inpatient acute rehabilitation and be discharged to home with assistance from spouse and/or family members.  It is anticipated Jennifer [name] will achieve level ofSUP to CGA with least restrictive device.   Jennifer [name] will require intensive occupational therapy and physical therapy to work on bed mobility, transfers, gait, strengthening, range of motion, balance, endurance, coordination, basic ADLs, and safety. Jennifer [name] will have 24 hour rehab nursing to monitor vital signs, administer medications and education, skin/wound infection prevention, pain control, bowel and bladder, bladder scans if necessary, fall prevention, and reinforcement of therapy strategies.  [name] will have case management for discharge planning and team coordination.  [name] will have registered dietician for weight management as needed.  [name]'s family will be scheduled for family/[name] education.  [name] will have therapeutic recreation for community re-entry and adaptive equipment.  [name] will have frequent visits by physiatry who will assess, plan, implement, and evaluate Jennifer individualized rehabilitation program for this [name].    Estimated date of admission to inpatient rehabilitation: 03/03/24    Estimated length of inpatient rehabilitation stay in order to achieve rehab medical/functional goals: 7 days    Anticipated  destination post-discharge from inpatient rehabilitation: community discharge with assistance; home  health therapy

## 2024-03-03 ENCOUNTER — Inpatient Hospital Stay
Admission: RE | Admit: 2024-03-03 | Discharge: 2024-03-13 | DRG: 092 | Disposition: A | Discharge status: Home Health | Source: Other Acute Inpatient Hospital | Attending: Pain Medicine | Admitting: Pain Medicine

## 2024-03-03 DIAGNOSIS — R300 Dysuria: Secondary | ICD-10-CM | POA: Diagnosis not present

## 2024-03-03 DIAGNOSIS — E039 Hypothyroidism, unspecified: Secondary | ICD-10-CM | POA: Diagnosis present

## 2024-03-03 DIAGNOSIS — Z96652 Presence of left artificial knee joint: Secondary | ICD-10-CM | POA: Diagnosis present

## 2024-03-03 DIAGNOSIS — E66812 Obesity, class 2: Secondary | ICD-10-CM | POA: Diagnosis present

## 2024-03-03 DIAGNOSIS — E559 Vitamin D deficiency, unspecified: Secondary | ICD-10-CM

## 2024-03-03 DIAGNOSIS — Z6841 Body Mass Index (BMI) 40.0 and over, adult: Principal | ICD-10-CM

## 2024-03-03 DIAGNOSIS — Z9181 History of falling: Secondary | ICD-10-CM

## 2024-03-03 DIAGNOSIS — G40909 Epilepsy, unspecified, not intractable, without status epilepticus: Secondary | ICD-10-CM | POA: Diagnosis present

## 2024-03-03 DIAGNOSIS — Z86718 Personal history of other venous thrombosis and embolism: Secondary | ICD-10-CM

## 2024-03-03 DIAGNOSIS — Z95828 Presence of other vascular implants and grafts: Secondary | ICD-10-CM

## 2024-03-03 DIAGNOSIS — M51372 Other intervertebral disc degeneration, lumbosacral region with discogenic back pain and lower extremity pain: Secondary | ICD-10-CM

## 2024-03-03 DIAGNOSIS — E785 Hyperlipidemia, unspecified: Secondary | ICD-10-CM | POA: Diagnosis present

## 2024-03-03 DIAGNOSIS — D62 Acute posthemorrhagic anemia: Secondary | ICD-10-CM | POA: Diagnosis not present

## 2024-03-03 DIAGNOSIS — G43109 Migraine with aura, not intractable, without status migrainosus: Secondary | ICD-10-CM | POA: Diagnosis present

## 2024-03-03 DIAGNOSIS — N6310 Unspecified lump in the right breast, unspecified quadrant: Secondary | ICD-10-CM

## 2024-03-03 DIAGNOSIS — R296 Repeated falls: Secondary | ICD-10-CM | POA: Diagnosis present

## 2024-03-03 DIAGNOSIS — N390 Urinary tract infection, site not specified: Secondary | ICD-10-CM | POA: Diagnosis not present

## 2024-03-03 DIAGNOSIS — E781 Pure hyperglyceridemia: Secondary | ICD-10-CM

## 2024-03-03 DIAGNOSIS — Z9049 Acquired absence of other specified parts of digestive tract: Secondary | ICD-10-CM

## 2024-03-03 DIAGNOSIS — M51371 Other intervertebral disc degeneration, lumbosacral region with lower extremity pain only: Secondary | ICD-10-CM

## 2024-03-03 DIAGNOSIS — M19011 Primary osteoarthritis, right shoulder: Secondary | ICD-10-CM | POA: Diagnosis present

## 2024-03-03 DIAGNOSIS — Z79899 Other long term (current) drug therapy: Secondary | ICD-10-CM

## 2024-03-03 DIAGNOSIS — R569 Unspecified convulsions: Principal | ICD-10-CM

## 2024-03-03 DIAGNOSIS — M1712 Unilateral primary osteoarthritis, left knee: Secondary | ICD-10-CM

## 2024-03-03 DIAGNOSIS — Z6839 Body mass index (BMI) 39.0-39.9, adult: Secondary | ICD-10-CM

## 2024-03-03 DIAGNOSIS — K219 Gastro-esophageal reflux disease without esophagitis: Secondary | ICD-10-CM

## 2024-03-03 DIAGNOSIS — T65891D Toxic effect of other specified substances, accidental (unintentional), subsequent encounter: Secondary | ICD-10-CM

## 2024-03-03 DIAGNOSIS — Z79890 Hormone replacement therapy (postmenopausal): Secondary | ICD-10-CM

## 2024-03-03 DIAGNOSIS — E66813 Obesity, class 3: Principal | ICD-10-CM

## 2024-03-03 DIAGNOSIS — K5900 Constipation, unspecified: Secondary | ICD-10-CM

## 2024-03-03 DIAGNOSIS — R270 Ataxia, unspecified: Principal | ICD-10-CM | POA: Diagnosis present

## 2024-03-03 DIAGNOSIS — Z96611 Presence of right artificial shoulder joint: Secondary | ICD-10-CM | POA: Diagnosis present

## 2024-03-03 DIAGNOSIS — G6289 Other specified polyneuropathies: Secondary | ICD-10-CM

## 2024-03-03 DIAGNOSIS — E8809 Other disorders of plasma-protein metabolism, not elsewhere classified: Secondary | ICD-10-CM | POA: Diagnosis not present

## 2024-03-03 DIAGNOSIS — I471 Supraventricular tachycardia, unspecified: Secondary | ICD-10-CM | POA: Diagnosis present

## 2024-03-03 DIAGNOSIS — B9620 Unspecified Escherichia coli [E. coli] as the cause of diseases classified elsewhere: Secondary | ICD-10-CM | POA: Diagnosis not present

## 2024-03-03 MED ORDER — OXYCODONE HCL 5 MG PO TABS
10.0000 mg | ORAL_TABLET | ORAL | Status: DC | PRN
Start: 2024-03-03 — End: 2024-03-13
  Administered 2024-03-03 – 2024-03-12 (×23): 10 mg via ORAL
  Filled 2024-03-03 (×25): qty 2

## 2024-03-03 MED ORDER — LEVETIRACETAM 500 MG PO TABS
1500.0000 mg | ORAL_TABLET | Freq: Two times a day (BID) | ORAL | Status: DC
Start: 2024-03-03 — End: 2024-03-13
  Administered 2024-03-03 – 2024-03-13 (×20): 1500 mg via ORAL
  Filled 2024-03-03 (×20): qty 3

## 2024-03-03 MED ORDER — ZOLPIDEM TARTRATE 5 MG PO TABS
5.0000 mg | ORAL_TABLET | Freq: Every evening | ORAL | Status: DC | PRN
Start: 2024-03-03 — End: 2024-03-03

## 2024-03-03 MED ORDER — PANTOPRAZOLE SODIUM 40 MG PO TBEC
40.0000 mg | DELAYED_RELEASE_TABLET | Freq: Every morning | ORAL | Status: DC
Start: 2024-03-04 — End: 2024-03-13
  Administered 2024-03-04 – 2024-03-13 (×10): 40 mg via ORAL
  Filled 2024-03-03 (×10): qty 1

## 2024-03-03 MED ORDER — POLYETHYLENE GLYCOL 3350 17 G PO PACK
17.0000 g | PACK | Freq: Every day | ORAL | Status: DC
Start: 2024-03-04 — End: 2024-03-13
  Administered 2024-03-04 – 2024-03-12 (×6): 17 g via ORAL
  Filled 2024-03-03 (×9): qty 1

## 2024-03-03 MED ORDER — TRIAMTERENE-HCTZ 37.5-25 MG PO TABS
1.0000 | ORAL_TABLET | Freq: Every day | ORAL | Status: DC
Start: 2024-03-04 — End: 2024-03-13
  Administered 2024-03-04 – 2024-03-13 (×4): 1 via ORAL
  Filled 2024-03-03 (×10): qty 1

## 2024-03-03 MED ORDER — LACOSAMIDE 200 MG PO TABS
200.0000 mg | ORAL_TABLET | Freq: Two times a day (BID) | ORAL | Status: DC
Start: 2024-03-03 — End: 2024-03-13
  Administered 2024-03-03 – 2024-03-13 (×20): 200 mg via ORAL
  Filled 2024-03-03 (×20): qty 1

## 2024-03-03 MED ORDER — CLONAZEPAM 0.5 MG PO TABS
1.0000 mg | ORAL_TABLET | Freq: Every day | ORAL | Status: DC
Start: 2024-03-04 — End: 2024-03-13
  Administered 2024-03-04 – 2024-03-13 (×10): 1 mg via ORAL
  Filled 2024-03-03 (×10): qty 2

## 2024-03-03 MED ORDER — GABAPENTIN 300 MG PO CAPS
300.0000 mg | ORAL_CAPSULE | Freq: Four times a day (QID) | ORAL | Status: DC
Start: 2024-03-03 — End: 2024-03-06
  Administered 2024-03-03 – 2024-03-06 (×10): 300 mg via ORAL
  Filled 2024-03-03 (×10): qty 1

## 2024-03-03 MED ORDER — LACTULOSE 10 GM/15ML PO SOLN
10.0000 g | Freq: Every day | ORAL | Status: DC | PRN
Start: 2024-03-03 — End: 2024-03-13
  Administered 2024-03-06: 10 g via ORAL
  Filled 2024-03-03: qty 30

## 2024-03-03 MED ORDER — ONDANSETRON HCL 4 MG/2ML IJ SOLN
4.0000 mg | Freq: Three times a day (TID) | INTRAMUSCULAR | Status: DC | PRN
Start: 2024-03-03 — End: 2024-03-13

## 2024-03-03 MED ORDER — ASPIRIN 325 MG PO TBEC
325.0000 mg | DELAYED_RELEASE_TABLET | Freq: Every day | ORAL | Status: DC
Start: 2024-03-04 — End: 2024-03-13
  Administered 2024-03-04 – 2024-03-13 (×10): 325 mg via ORAL
  Filled 2024-03-03 (×10): qty 1

## 2024-03-03 MED ORDER — ROSUVASTATIN CALCIUM 10 MG PO TABS
5.0000 mg | ORAL_TABLET | Freq: Every evening | ORAL | Status: DC
Start: 2024-03-03 — End: 2024-03-13
  Administered 2024-03-03 – 2024-03-12 (×10): 5 mg via ORAL
  Filled 2024-03-03 (×10): qty 1

## 2024-03-03 MED ORDER — LEVOTHYROXINE SODIUM 100 MCG PO TABS
100.0000 ug | ORAL_TABLET | Freq: Every day | ORAL | Status: DC
Start: 2024-03-04 — End: 2024-03-13
  Administered 2024-03-04 – 2024-03-13 (×10): 100 ug via ORAL
  Filled 2024-03-03 (×10): qty 1

## 2024-03-03 MED ORDER — ONDANSETRON HCL 8 MG PO TABS
4.0000 mg | ORAL_TABLET | Freq: Three times a day (TID) | ORAL | Status: DC | PRN
Start: 2024-03-03 — End: 2024-03-13

## 2024-03-03 MED ORDER — POLYETHYLENE GLYCOL 3350 17 G PO PACK
17.0000 g | PACK | Freq: Every day | ORAL | Status: DC | PRN
Start: 2024-03-03 — End: 2024-03-13
  Administered 2024-03-04 – 2024-03-08 (×2): 17 g via ORAL
  Filled 2024-03-03: qty 1

## 2024-03-03 MED ORDER — DOCUSATE SODIUM 100 MG PO CAPS
100.0000 mg | ORAL_CAPSULE | Freq: Every day | ORAL | Status: DC
Start: 2024-03-04 — End: 2024-03-13
  Administered 2024-03-04 – 2024-03-13 (×10): 100 mg via ORAL
  Filled 2024-03-03 (×10): qty 1

## 2024-03-03 MED ORDER — METOPROLOL SUCCINATE ER 25 MG PO TB24
25.0000 mg | ORAL_TABLET | Freq: Every day | ORAL | Status: DC
Start: 2024-03-04 — End: 2024-03-13
  Administered 2024-03-04 – 2024-03-09 (×4): 25 mg via ORAL
  Filled 2024-03-03 (×8): qty 1

## 2024-03-03 MED ORDER — HEPARIN SODIUM (PORCINE) 5000 UNIT/ML IJ SOLN
5000.0000 [IU] | Freq: Three times a day (TID) | INTRAMUSCULAR | Status: DC
Start: 2024-03-03 — End: 2024-03-13
  Administered 2024-03-03 – 2024-03-13 (×27): 5000 [IU] via SUBCUTANEOUS
  Filled 2024-03-03 (×28): qty 1

## 2024-03-03 MED ORDER — VITAMIN D (ERGOCALCIFEROL) 1.25 MG (50000 UT) PO CAPS
50000.0000 [IU] | ORAL_CAPSULE | ORAL | Status: DC
Start: 2024-03-08 — End: 2024-03-13
  Administered 2024-03-08: 50000 [IU] via ORAL
  Filled 2024-03-03 (×4): qty 1

## 2024-03-03 MED ORDER — OXYCODONE HCL 5 MG PO TABS
5.0000 mg | ORAL_TABLET | ORAL | Status: DC | PRN
Start: 2024-03-03 — End: 2024-03-13
  Administered 2024-03-08: 5 mg via ORAL
  Filled 2024-03-03 (×2): qty 1

## 2024-03-03 NOTE — Unmapped External Note (Signed)
 Problem: Adult Inpatient Plan of Care  Goal: Plan of Care Review  Outcome: Progressing  Goal: Patient-Specific Goal (Individualized)  Outcome: Progressing  Goal: Absence of Hospital-Acquired Illness or Injury  Outcome: Progressing  Goal: Optimal Comfort and Wellbeing  Outcome: Progressing  Goal: Readiness for Transition of Care  Outcome: Progressing     Problem: Fall Prevention  Goal: Prevent/Manage Accidental Injury (Falls)  Description: Consider requesting a pharmacologic review as needed.  Consider asking the MD to consult PT / OT for an evaluation.  Outcome: Progressing     Problem: Pain Acute  Goal: Optimal Pain Control and Function  Outcome: Progressing     Problem: Comorbidity Management  Goal: Blood Pressure in Desired Range  Outcome: Progressing

## 2024-03-03 NOTE — Discharge Summary (Signed)
 Discharge Summary       Discharge Summary    [facility]  Pulmonary Diseases, Critical Care Medicine, Sleep Medicine  [facility]  [email or web address]  [id number]                        Patient ID:  Jennifer [name]  68 y.o.  1955-10-01    Admit Date:02/28/2024    Discharge Date:03/03/2024     Discharge Diagnosis:      Active Hospital Problems    Diagnosis    . Multiple falls [R29.6]        Operative Procedures:      PCP:  Dennison VEAR Blower, MD    Consultants:  Consulting Physicians  Consulting Provider: Reinaldo Lowery HERO, MD  Consulting Provider: Mavis Re, MD  Consulting Provider: Molli Lowery, MD    Hospital Course:      Pt from home had mech fall in shower falling on R side. Pt had R shoulder replacement on Wed and fell yesterday and today .      10/5 PT recommending IPR, polypharmacy dw patient and husband     03/02/2024: Patient is awake and alert, denies any pain and shortness of breath.  Right arm is in a sling.     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 baseline hemoglobin: 12.0 g/dL     # Obesity, class II  - BMI: 37.9 (02/29/24)     # Hypokalemia  -Monitor electrolytes and replace as per protocol     Advanced Care planning:  Code status verified and documented. Surrogate decision maker is listed in the patient's medical record.              Review of System at Discharge    Constitutional: negative for malaise/fatigue, negative for weakness, fever, chills.    Skin: negative for rash   HENT: negative for congestion and headaches. negative for blurred vision   Cardiovascular: negative for palpitations,  chest pain, orthopnea    Respiratory: negative for cough, shortness of breath.   Gastointestinal: negative for abdominal pain, diarrhea, nausea and vomiting   Genitourinary: negative for dysuria and hematuria    Musculoskeletal: negative for back pain and joint pain   Neurological: negative for dizziness, seizures and tremor       Vitals at Discharge  BP 106/49   Pulse 71   Temp 97.8 F (36.6 C)   Resp 18   Ht 5' 3 (1.6 m)   Wt 97 kg (213 lb 13.5 oz)   SpO2 97%   BMI 37.88 kg/m     Physical Exam at Discharge:    Constitutional: well developed, nourished, no distress and alert and oriented x 3   HENT: atraumatic, nose normal, normocephalic, left exterior ear normal, right external ear normal, and oropharynx clear and moist   Eyes: conjunctiva normal, EOM normal, and PERRL   Neck: ROM normal, supple, and trachea normal   Cardiovascular: heart sounds normal, intact distal pulses, normal rate, and regular rhythm  Pulmonary/Chest Wall: breath sounds normal and effort normal   Abdominal: appearance normal, bowel sounds normal, and soft   Genitourinary/Anorectal: deferred   Musculoskeletal: normal ROM, right arm in sling   Neurological: awake, alert and oriented x 3   Skin: dry, intact, and warm   Psych:                   Labs within last 72 hrs:    BMP:   Recent Labs     03/03/24  0624 03/02/24  0404 03/01/24  0432   GLUCOSE 112* 125* 125*   CALCIUM  9.3 9.1 9.0   BUN 16 17 18    CREAT 0.6* 0.7* 0.6*   NA 140 141 143   POTASSIUM 4.0 4.0 3.1*   CHLORIDE 101 103 103   CO2 27 26 28      CBC:    Recent Labs     03/02/24  0404   WBC 8.7   RBC 3.31*   HEMOGLOBIN 10.4*   HCT 31.6*   MCV 96   MCH 31   MCHC 33   PLATELET 312   RDW 12.7       Significant Diagnostic Studies:    Recent Results (from the past 1080 hours)   1. MRI Head without Contrast    Narrative    HISTORY:  FOLLOW UP STROKE    EXAM: MR HEAD/BRAIN WITHOUT IV CONTRAST    COMPARISONS:  CT head from 3 days ago    TECHNIQUE:  Magnetic resonance imaging of the  head/brain without the  administration of contrast. Standardized images were acquired in  multiple planes with multiple pulse sequences.    FINDINGS:  EXTRACRANIAL:  No extracranial soft tissue swelling or fluid collection is  appreciated. The orbits are symmetric.    BONES/SINUSES:  Bones are grossly intact with no significant abnormality. Visualized  paranasal sinuses are well aerated. Visualized mastoid air cells are  well aerated.    INTRACRANIAL:  No altered signal is seen on the isotopic diffusion-weighted images  (DWI) or apparent diffusion coefficient (ADC) maps to suggest abnormal  restricted diffusion.    Moderate ventriculomegaly is noted. Normal flow voids are appreciated  within the larger blood vessels.    No cerebral edema or mass. Mild white matter T2/FLAIR  hyperintensity(s) are present. These can be seen in symptomatic and  asymptomatic patients, but most commonly related to chronic  microangiopathy. Less common etiologies include migraines,  demyelinating disease, and vasculitis. Mild global cerebral atrophy is  present.    No abnormal parenchymal gradient/susceptibility signal to suggest  blood products/hemorrhage or calcification.      Impression    IMPRESSION:  1. No acute intracranial abnormality, specifically no evidence of  acute ischemia.  2. Mild white matter T2/FLAIR hyperintensity(s) are present. These can  be seen in symptomatic and asymptomatic patients, but most commonly  related to chronic microangiopathy. Less common etiologies include  migraines, demyelinating disease, and vasculitis.    Electronically signed by: Selinda Moats  Board Certified Radiologist  03/02/2024 12:14 PM   2. CT - ED CT HEAD NO CONTRAST    Narrative    HISTORY:  multiple falls    EXAM: CT HEAD/BRAIN WITHOUT CONTRAST    COMPARISONS:  March 12, 2022    TECHNIQUE:  CT of the head/brain without intravenous contrast. Multiplanar  reconstructed images were created and reviewed.    DICOM format image data is available  electronically for review and  comparison.    FINDINGS:  EXTRACRANIAL:  No mass, significant soft tissue swelling, or hematoma appreciated.    BONES/SINUSES:  Bones are intact with no significant abnormality. Visualized paranasal  sinuses are well aerated. Visualized mastoid air cells are well  aerated.    INTRACRANIAL:  No acute intracranial hemorrhage or cerebral edema. No midline shift  or mass effect identified.  Stable borderline prominent ventricles. No  significant white matter disease. Suspected age-related cortical  involutional changes noted.    OTHER:  No other significant findings.      Impression    IMPRESSION:  No definite acute intracranial hemorrhage or mass effect identified.    Electronically signed by: Ami Breeding, MD  Board Certified  Radiologist 02/28/2024 6:13 PM   3. EKG 12-LEAD   Result Value Ref Range    Heart Rate 54 bpm    RR Interval 1,108 ms    Atrial Rate 53 ms    P-R Interval 201 ms    P Duration 157 ms    P Horizontal Axis 7 deg    P Front Axis 40 deg    Q Onset 506 ms    QRSD Interval 109 ms    QT Interval 443 ms    QTcB 421 ms    QTcF 428 ms    QRS Horizontal Axis 230 deg    QRS Axis -24 deg    I-40 Front Axis 17 deg    t-40 Horizontal Axis 216 deg    T-40 Front Axis -72 deg    T Horizontal Axis 5 deg    T Wave Axis 20 deg    S-T Horizontal Axis 18 deg    S-T Front Axis 89 deg    Impression - BORDERLINE ECG -     Impression SB-Sinus bradycardia-rate<0     Impression LAD-Left axis deviation-QRS axis (0,0)     Impression -Minor T wave changes, anterior leads-     Impression       -No significant change since prior tracing, 09/11/2023-   4. XR - SHOULDER COMPLETE RT    Narrative    HISTORY:  Recent right shoulder replacement. Status post fall with right  shoulder plane.    EXAM: SHOULDER RIGHT 3 VIEW    COMPARISONS:  Today     TECHNIQUE:  Radiographic images were obtained of the right shoulder.    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 No evidence of hardware failure. No acute  displaced fracture or malalignment.          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 Soft tissue  gas along the right shoulder joint is favored to be postsurgical  related. No acute displaced fracture or malalignment.    Electronically signed by: Selinda Moats  Board Certified Radiologist  02/28/2024 5:35 PM   5. XR - HUMERUS RT    Narrative    HISTORY:  TRAUMA    EXAM: HUMERUS RIGHT 2 VIEW    COMPARISONS:  No prior studies available.    TECHNIQUE:  Radiographic images were obtained of the right humerus.    FINDINGS: Exam is limited secondary to patient/technical factors  including suboptimal patient positioning.  Postsurgical changes of  right shoulder orthoplasty better characterized on same-day shoulder  radiograph.  No fracture or destructive lesion/process. Joint alignment is intact.   The distal humerus is not visualized.    Soft tissues are grossly negative.      Impression  IMPRESSION:  No acute findings. Repeat humerus radiographs are recommended given  the distal humerus is not visualized on the current exam.    Electronically signed by: Selinda Moats  Board Certified Radiologist  02/28/2024 5:36 PM   6. CT UPPER EXT RT W/O CONTRAST    Narrative    HISTORY:  Other specific arthropathies, not elsewhere classified, right shoulder    EXAM: CT SHOULDER RIGHT WITHOUT IV CONTRAST    COMPARISONS:  No prior exams available for comparison    TECHNIQUE:  CT of the shoulder without the administration of intravenous of intra-articular contrast. Multiplanar reconstructed images were created and reviewed.    DICOM format image data is available electronically for review and comparison.    FINDINGS:  BONES:  No acute fracture or destructive process.    JOINT(S):  There are moderate hypertrophic changes of the acromioclavicular joint identified. There is subacromial spurring with narrowing of the acromiohumeral interval, and  glenohumeral joint narrowing, moderate with subchondral cystic changes and sclerosis and associated osteophytosis on both sides of the joint. 2.1 x 0.9 cm focal fluid collection adjacent to the anterior aspect of the humeral head near the biceps tendon.     SOFT TISSUES:  No bulky adenopathy or obvious mass/fluid collection.    VISUALIZED LUNG:  Visualized lung is clear.      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    Electronically signed by: Lorrene DELENA General, MD  Board Certified Radiologist 02/16/2024 7:30 PM EDT         Condition at discharge: Afebrile, Eating, Drinking, Voiding, and Stable    Drug Allergies: No Active Allergies     Discharge Medication List        CONTINUE taking these medications      aspirin  325 mg Tabs  Take 1 Tab by Mouth Once a Day.  Refills: 0      clonazePAM  1 mg Tabs  Take 1 Tab by Mouth Three Times Daily with Meals. Indications: petit mal seizures  Refills: 0  Commonly known as: KlonoPIN       ergocalciferol  50,000 unit Caps  Take 1 Cap by Mouth Every 7 Days.  Refills: 0  Commonly known as: Vitamin D2      fish oil 500 mg Caps  Take 1 Cap by Mouth.  Refills: 0      gabapentin  300 mg Caps  Take 1 Cap by Mouth Every 6 hours.  Refills: 0  Commonly known as: Neurontin       lacosamide  100 mg Tabs  Take 2 Tabs by Mouth Twice Daily.  Refills: 0  Commonly known as: Vimpat       lansoprazole  30 mg Cpdr  Take 1 Cap by Mouth Every Morning Before Breakfast.  Refills: 0  Commonly known as: Prevacid       levETIRAcetam  100 mg/mL Soln  Take 15 mL by Mouth Twice Daily.  Refills: 0  Commonly known as: Keppra       levothyroxine  100 mcg Tabs  Take 1 Tab by Mouth Once a Day.  Refills: 0  Commonly known as: Synthroid       metoprolol  XL 25 mg Tb24  Take 1 Tab by Mouth Once a Day.  Refills: 0  Commonly known as: Toprol  XL      rosuvastatin  5 mg Tabs  Take 1 Tab by Mouth Every Night at Bedtime.  Refills: 0  Commonly known as: Crestor       triamterene -hctz  37.5-25 mg Caps  Take 1 Cap by Mouth Once a Day.  Refills: 0  Commonly known as: Dyazide              Discharge Procedure Orders   No Activity Restrictions     Follow Up with PCP within 7 days    Follow up with Dennison VEAR Blower, MD within 7 days.     Follow Up with Specialist    Follow up with Dr Luann Press       Disposition:  IPR          Youlanda CANDIE Mend, M.D., F.A.C.P.  [facility]  [address] [place] [address]  [phone]

## 2024-03-03 NOTE — Nursing Progress Note (Signed)
 VIRTUAL NURSE ADMISSION ASSESSMENT        Date and time: 03/03/24 8:08 PM  Patient name: Jennifer [name] A  Attending Physician:  Rea Ozell RAMAN, MD    VIRTUAL NURSE:  INTERPRETER: Wanda  N/A   BEDSIDE NURSE: Carie   Reason for Admission: Seizures (CMS/HCC) [R56.9]   Date of Admission:  03/03/2024     Initial Assessment:   Dentures/ Hearing Aids/ Glasses?: NONE     LBM: 03/03/24     Point of contact :     Denai, Caba (Spouse)  [phone] (Mobile)        What is most important to pt during stay? Get stronger, not fall so much       Important/Safety Information:   Mobility prior to admission: ambulates independently     Mobility devices used prior to admission: none     Pt admitted from (home/ NH/ SNF): admitted to rehab post        Admission Data Collected:               YES

## 2024-03-03 NOTE — H&P (Signed)
 IRF Post-Admission Assessment  History and Physical    Reason for admission: Impaired mobility, ambulation 2/2 poss seizures, AMS, s/p fall due to medications    Date of admission: 03/03/2024    History of present illness:   This 68 y.o. year old right hand-dominant female with PMH of epilepsy, HLD, hypothyroidism, DVT, s/p IVD filter, migraines, SVT, iron deficiency anemia, knee and hip replacements, colon resection, lumbar laminectomy, adverse anesthesia effects, who presented to [facility] on 02/28/24 after mechanical fall in shower.  Patient had recent right shoulder replacement on 02/26/24 (NWB to RUE with sling) and accidentally overdosed on her medications. Patient stated she felt like she might have been having aura that she sees prior to her having a seizure.     CT head showed no acute abnormalities. 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 Soft tissue gas along the right shoulder joint is favored to be postsurgical related. No acute displaced fracture or malalignment. Xray of right arm showed no acute findings.     Neurology was consulted. Ataxia attributed to due to medication overdosage versus other causes. Patient also is on clonazepam  1 mg 3 times daily and gabapentin  300 mg 4 times daily  MRI of the brain negative for acute abnormality.   Continue Keppra  1500 mg twice daily per home dosage  Continue lacosamide  200 mg 2 times daily per home dosage     Patient presents with unsteady gait, impaired balance, decreased strength, impaired functional mobility, decreased activity tolerance, orthopedic restrictions, and decreased ROM s/p R reverse TSA with 3 subsequent falls at home since 10/1.  The patient has been working well with therapy services to include PT and OT to address functional mobility, self care deficit, and pain.  The patient would benefit from an intensive level of rehab along with close medical management of multiple medical conditions and  rehab MD to drive the rehab process.  The patient is medically stable for IRF level of care.    PRECAUTIONS:    Fall  Seizures  NWB RUE, sling    Past Medical History: Medical History[1]    Medications:      Scheduled Meds: PRN Meds:    aspirin  EC, 325 mg, Oral, Daily  clonazePAM , 1 mg, Oral, Daily  docusate sodium , 100 mg, Oral, Daily  gabapentin , 300 mg, Oral, 4 times per day  heparin (porcine), 5,000 Units, Subcutaneous, Q8H SCH  lacosamide , 200 mg, Oral, BID  levETIRAcetam , 1,500 mg, Oral, Q12H SCH  levothyroxine , 100 mcg, Oral, Daily at 0600  metoprolol  succinate XL, 25 mg, Oral, Daily  pantoprazole, 40 mg, Oral, QAM AC  polyethylene glycol, 17 g, Oral, Daily  rosuvastatin , 5 mg, Oral, QHS  triamterene -hydrochlorothiazide , 1 tablet, Oral, Daily  [START ON 03/08/2024] vitamin D  (ergocalciferol ), 50,000 Units, Oral, Weekly        Continuous Infusions:   lactulose, 10 g, Daily PRN  naloxone , 0.4 mg, PRN  ondansetron , 4 mg, Q8H PRN   Or  ondansetron , 4 mg, Q8H PRN  oxyCODONE , 10 mg, Q4H PRN  oxyCODONE , 5 mg, Q4H PRN  polyethylene glycol, 17 g, Daily PRN            Medication Review  A complete drug regimen review was completed: Yes  Were any drug issues found during review?: No   If yes   Was I contacted and action was taken by midnight of the next calendar day once issue was identified?: N/A   Person who contacted me:  N/A  What was the issue?: N/A  Action taken: N/A  What was the time the issue was identified?: N/A          Allergies: Allergies[2]        Family History: Family History[3]    Social History:    Marital Status: married   Does patient have children?: Yes   How many children does the patient have?: 3  Where do the children reside?: 2 local (pt lives with dtr's family), [place] on [place]  Employment status: Retired Solicitor activities/hobbies: n/a  Additional social history: Patient was independent  Pre-hospital living environment:   Lives With: Garment/textile technologist, dtr, son in law and grandchild  Type  of Home: House (11 STE basement quarters 5+6 stairs down)  Home Layout: Basement  Bathroom Shower/Tub: Walk-in Music therapist: Grab bars in shower, Buyer, retail: None  Cooking and shopping: spouse  Not driving  Spouse will assist patient after rehab.    Functional history:   Pre-morbid status Admission status   Bed mobility Independent Maximal assist   Transfers Independent Moderate assist   Locomotion Independent Moderate assist   Upper body dressing Independent Maximal assist   Lower body dressing Independent Moderate assist   Bathing Independent Maximal assist   Toileting Independent Maximal assist   Communication Not impaired Not impaired   Swallow Not impaired Not impaired   Cognition Not impaired Impaired (min)     Review of systems:  Pertinent positives are: Patient with no complaints of headache, denies nausea or vomiting and no complaints of dizziness.  Patient with no seizure, no symptoms of Aura.  Patient complaining of right shoulder pain, denied left shoulder pain.  Patient denies lower extremity pain or weakness.  No complaint of chest pain or abdominal pain.  Patient had BM today , denies dysuria.  A full review of systems was obtained and was otherwise negative.      Physical Examination:   BP 106/70   Pulse 68   Temp 98.1 F (36.7 C) (Oral)   Resp 18   Ht 1.6 m (5' 3)   Wt 101 kg (222 lb 10.6 oz)   SpO2 98%   BMI 39.44 kg/m   Estimated body mass index is 39.44 kg/m as calculated from the following:    Height as of this encounter: 1.6 m (5' 3).    Weight as of this encounter: 101 kg (222 lb 10.6 oz).    General appearance: Appears well.  In no acute distress. Obese. Appears stated age.  Eyes:Anicteric sclerae. Conjunctivae non-injected. EOMI. PERLA  HENT:Symmetric facies. Hearing grossly intact. Dentition in good repair. Moist mucous membranes. Pt with bruise upper lip.  CV:+S1S2 Heart rate and rhythm are regular. I/VI SEM, No significant lower limb  edema.  Pulm:Lungs are clear to auscultation bilaterally. No wheezes, rales, or rhonchi.  Jai:Dnqu. Non-tender. Normoactive bowel sounds. Obese.  Skin:No visible rashes or breakdown.  Aquacel dressing at right anterior shoulder.  No drainage noted. Sling R arm.  Neuro: Speech fluent and appropriate. Sensation intact..  Cranial nerves 2-12 intact.  No ankle clonus and Babinski sign negative.    Manual muscle strength testing:  Muscle group Right Left   Shoulder abductors Unable due to pain 5   Elbow flexors 5 5   Elbow extensors 5 5   Wrist extensors 5 5   Middle finger DIP flexors 5 5   Little finger abductors 5 5   Hip flexors 5 4  Knee extensors 5 5   Ankle dorsiflexors 5 5   Ankle plantarflexors 5 5   Great toe extensors 5 5     Psych: Alert Pleasant and cooperative Oriented x 3 Normal mood and affect    INPATIENT REHABILITATION FACILITY - PATIENT ASSESSMENT INSTRUMENT QUALITY INDICATORS: COGNITIVE PATTERNS    Cognitive Pattern Assessment Used: BIMS   Repetition of Three Words (First Attempt): 3   Temporal Orientation: Year: Correct   Temporal Orientation: Month: Accurate within 5 days  Temporal Orientation: Day: Correct  Recall: Sock: Yes, no cue required  Recall: Blue: Yes, no cue required  Recall: Bed: Yes, no cue required  BIMS Summary Score: 15      INPATIENT REHABILITATION FACILITY - PATIENT ASSESSMENT INSTRUMENT QUALITY INDICATORS: CONFUSION ASSESSMENT METHOD (CAM)    Is there evidence of an acute change in mental status from the patient's baseline?: No   Inattention: Behavior not present   Disorganized thinking: Behavior not present  Altered level of consciousness: Behavior not present      INPATIENT REHABILITATION FACILITY - PATIENT ASSESSMENT INSTRUMENT QUALITY INDICATORS: PHQ-2 TO 9    Is the patient able to answer the depression risk questions?: Yes   Little interest or pleasure in doing things: Not at all   Feeling down, depressed, or hopeless: Not at all   PHQ2 Score: 0         Labs:   No  results for input(s): GLUCOSEWHOLE in the last 24 hours.    Recent Labs   Lab 03/04/24  0525   WBC 6.97   Hemoglobin 10.5*   Hematocrit 32.3*   Platelet Count 352*        Recent Labs   Lab 03/04/24  0525   Sodium 138   Potassium 4.1   Chloride 102   CO2 27   BUN 15   Creatinine 0.7   Calcium  9.0   Albumin  2.9*   Protein, Total 5.7*   Bilirubin, Total 0.5   Alkaline Phosphatase 57   ALT 14   AST (SGOT) 16   Glucose 97             Results for orders placed or performed during the hospital encounter of 03/03/24 (from the past 24 hours)   Comprehensive Metabolic Panel    Collection Time: 03/04/24  5:25 AM   Result Value    Glucose 97    BUN 15    Creatinine 0.7    Sodium 138    Potassium 4.1    Chloride 102    CO2 27    Calcium  9.0    Anion Gap 9.0    GFR >60.0    AST (SGOT) 16    ALT 14    Alkaline Phosphatase 57    Albumin  2.9 (L)    Protein, Total 5.7 (L)    Globulin 2.8    Albumin /Globulin Ratio 1.0    Bilirubin, Total 0.5   CBC with Differential (Component)    Collection Time: 03/04/24  5:25 AM   Result Value    WBC 6.97    Hemoglobin 10.5 (L)    Hematocrit 32.3 (L)    Platelet Count 352 (H)    MPV 10.6    RBC 3.39 (L)    MCV 95.3    MCH 31.0    MCHC 32.5    RDW 13    nRBC % 0.0    Absolute nRBC 0.00    Preliminary Absolute Neutrophil Count 3.53    Neutrophils %  50.6    Lymphocytes % 34.7    Monocytes % 8.5    Eosinophils % 4.9    Basophils % 0.6    Immature Granulocytes % 0.7    Absolute Neutrophils 3.53    Absolute Lymphocytes 2.42    Absolute Monocytes 0.59    Absolute Eosinophils 0.34    Absolute Basophils 0.04    Absolute Immature Granulocytes 0.05   Urinalysis with Reflex to Microscopic Exam and Culture    Collection Time: 03/04/24  6:42 AM    Specimen: Urine, Clean Catch   Result Value    Urine Color Yellow    Urine Clarity Clear    Urine Specific Gravity 1.021    Urine pH 6.0    Urine Leukocyte Esterase Negative    Urine Nitrite Negative    Urine Protein Negative    Urine Glucose Negative    Urine Ketones  Negative    Urine Urobilinogen Normal    Urine Bilirubin Negative    Urine Blood Negative   Urine Elnor Culture Hold Tube    Collection Time: 03/04/24  6:42 AM   Result Value    Extra Tube Hold for add-ons.          Radiology: all results from this admission  MRI Head without Contrast  Result Date: 03/02/2024  IMPRESSION: 1. No acute intracranial abnormality, specifically no evidence of acute ischemia. 2. Mild white matter T2/FLAIR hyperintensity(s) are present. These can be seen in symptomatic and asymptomatic patients, but most commonly related to chronic microangiopathy. Less common etiologies include migraines, demyelinating disease, and vasculitis. Electronically signed by: Selinda Moats  Board Certified Radiologist 03/02/2024 12:14 PM    CT Head WO Contrast  Result Date: 02/28/2024  IMPRESSION: No definite acute intracranial hemorrhage or mass effect identified. Electronically signed by: Ami Breeding, MD  Board Certified Radiologist 02/28/2024 6:13 PM    XR Humerus Right AP Lateral  Result Date: 02/28/2024  IMPRESSION: No acute findings. Repeat humerus radiographs are recommended given the distal humerus is not visualized on the current exam. 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 Soft tissue gas along the right shoulder joint is favored to be postsurgical related. No acute displaced fracture or malalignment. 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 Electronically signed by: Lorrene DELENA General, MD  Board Certified Radiologist 02/16/2024 7:30 PM EDT        Assessment:     68 y/o female with PMH of epilepsy, HLD, hypothyroidism, DVT, s/p IVD filter, migraines,  SVT, iron deficiency anemia, knee and hip replacements, colon resection, lumbar laminectomy, adverse anesthesia effects,  s/p fall after accidental OD on her meds, she had recent R shoulder replacement on 02/26/24.    #Rehab  - PT, OT with ADL, mobility, and ambulation with AD  - TR  - Fall, seizures, NWB RUE, and sling  -We had a team conference today  led by me and concur with team findings and recommendations. Please refer to the note for details.     #Ataxia, accidental OD with meds  #s/p fall  -Monitor, esp with use of pain meds  -Therapy with gait training, stability  -Per neurology, ataxia attributed to medication OD    #  H/o seizure d/o  -Poss seizures  -CT, MRI of the brain with no acute abn  -Continue with seizure meds: Keppra , Lacosamide ,   -On low dose of clonazepam   -Seizure precaution  -Neurology f/u    #S/p R shoulder replacement  -NWB RUE, on sling  -f/u with Dr. Barnabas  -shoulder x ray with postop changes, no displaced or fx  -Pt has f/u with orthopedic NP this Friday for postop f/u.    #HLD  - On Crestor     #Hypothyroidism  -On synthroid     #H/o iron def anemia  -CBC in am  -Est baseline Hgb 12.0 g/dl  -H/H 89.4/67.6 today , interm f/u. Postop anemia.    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    Problem List[4]    [x]  Requires an intensive inpatient rehabilitation program with multidisciplinary therapies, rehab nursing, and close physician management.    [x]  The following co-morbidities may complicate rehabilitation:  Anemia, Hypoalbuminemia, Morbid obesity, and Wounds    [x]  Is at risk for the following complications:  Injurious falls, Contracture, Pneumonia, Urinary tract infection, Venous thromboembolic disease, Arrhythmia, and Wound infection        Individualized Overall Plan of Care:    - REHAB: Begin comprehensive and intensive inpatient  rehab program, including:  Neuropsychology consult as needed;  Speech therapy  60-120 min daily, 5-6 times per week  Therapeutic recreation  Psychology  Case management  Rehabilitation nursing  Physical therapy 60-120 min daily, 5-6 times per week 10 days  Occupational therapy  60-120 min daily, 5-6 times per week 10 days    Will work in an interdisciplinary manner to address the following impairments and issues:   Mobility, ADLs, Impaired strength, Impaired ROM, Impaired endurance, Adherence to precautions, Caregiver training, Wounds, Medication management, Adjustment to disability, Community support and resources, Impaired coordination, Impaired balance, and Coping strategies    Requires 24h rehabilitation nursing to address:  Hydration needs, Medication teaching, Nutrition, Positioning, Safety, and Wounds    Anticipate a discharge to:  Home with assistance    Estimated length of stay: 10 days      Goals for discharge:  Bed mobility Modified independent with LRAD   Transfers Modified independent with LRAD   Locomotion Modified independent with LRAD   Upper body dressing Modified independent with LRAD   Lower body dressing Modified independent with LRAD   Bathing Modified independent with LRAD   Toileting Modified independent with LRAD   Communication Use compensatory strategies to express needs and wants     Swallow Tolerate least restrictive oral diet without signs or symptoms of aspiration     Cognition Use compensatory strategies appropriately to compensate       Rehab potential: good    Prognosis: good    Potential limitations:Challenging home environment, Limited caregiver support, and Limited resources for follow-up      Review of Pre-admission assessment  [x]  I have reviewed the nurse liaison's pre-admission assessment.  I do not note any significant changes at this time and agree with patient's appropriateness for  Intensive inpatient rehab program.      Patient has a BMI of 39.44 kg/m2    Class 2 Obesity:  BMI of 35 to 39.9  Signed by: Ozell GORMAN Lawyer, MD MD    [facility]      If there are questions or concerns about the content of this note or information contained within the body of this dictation they should be addressed directly with the author for clarification.         [1]   Past Medical History:  Diagnosis Date    Abnormal vision     glasses    Arrhythmia 2018    SVT (had ablation)    Arthritis     Complication of anesthesia     urinary retention with every joint replacement, BP drop with spinal X1, another time spinal didn't work    Convulsions (CMS/HCC)     epilepsy, last seizure 2010    Deep venous thrombosis of distal lower extremity (CMS/HCC) 2018    after cholecystectomy; left leg    Diverticulitis     takes miralax     Encounter for blood transfusion 2018    Gastric ulceration 2018    GI bleed with requiring 4 units PRBCs per pt    Hyperlipidemia     Hypothyroidism     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    [2] No Known Allergies  [3]   Family History  Problem Relation Name Age of Onset    Cancer Mother Ronal Skeens         lung    Arthritis Mother Ronal Skeens     Heart disease Mother Ronal Skeens         A fib/ chf    Stroke Mother Ronal Skeens     Heart attack Father Bette Skeens 61    Cancer Son Raziyah Vanvleck         Thyroid     Cancer Maternal Uncle Larnell Ponto    [4]   Patient Active Problem List  Diagnosis    Primary osteoarthritis of left knee    Mass of right breast, unspecified quadrant    Ventricular tachyarrhythmia (CMS/HCC)    Hypertriglyceridemia    Hypothyroidism, unspecified type    DDD (degenerative disc disease), lumbosacral    Prediabetes    Class 3 severe obesity with serious comorbidity and body mass index (BMI) of 40.0 to 44.9 in adult, unspecified obesity type (CMS/HCC)    Seizure disorder (CMS/HCC)    SVT (supraventricular tachycardia)    History of colon resection    History of cholecystectomy    History  of maternal deep vein thrombosis (DVT)    Morbid obesity with BMI of 45.0-49.9, adult (CMS/HCC)    Morbid obesity (CMS/HCC)    Seizures (CMS/HCC)

## 2024-03-04 LAB — LAB USE ONLY - CBC WITH DIFFERENTIAL
Absolute Basophils: 0.04 x10 3/uL (ref 0.00–0.08)
Absolute Eosinophils: 0.34 x10 3/uL (ref 0.00–0.44)
Absolute Immature Granulocytes: 0.05 x10 3/uL (ref 0.00–0.07)
Absolute Lymphocytes: 2.42 x10 3/uL (ref 0.42–3.22)
Absolute Monocytes: 0.59 x10 3/uL (ref 0.21–0.85)
Absolute Neutrophils: 3.53 x10 3/uL (ref 1.10–6.33)
Absolute nRBC: 0 x10 3/uL (ref <=0.00)
Basophils %: 0.6 %
Eosinophils %: 4.9 %
Hematocrit: 32.3 % — ABNORMAL LOW (ref 34.7–43.7)
Hemoglobin: 10.5 g/dL — ABNORMAL LOW (ref 11.4–14.8)
Immature Granulocytes %: 0.7 %
Lymphocytes %: 34.7 %
MCH: 31 pg (ref 25.1–33.5)
MCHC: 32.5 g/dL (ref 31.5–35.8)
MCV: 95.3 fL (ref 78.0–96.0)
MPV: 10.6 fL (ref 8.9–12.5)
Monocytes %: 8.5 %
Neutrophils %: 50.6 %
Platelet Count: 352 x10 3/uL — ABNORMAL HIGH (ref 142–346)
Preliminary Absolute Neutrophil Count: 3.53 x10 3/uL (ref 1.10–6.33)
RBC: 3.39 x10 6/uL — ABNORMAL LOW (ref 3.90–5.10)
RDW: 13 % (ref 11–15)
WBC: 6.97 x10 3/uL (ref 3.10–9.50)
nRBC %: 0 /100{WBCs} (ref <=0.0)

## 2024-03-04 LAB — COMPREHENSIVE METABOLIC PANEL
ALT: 14 U/L (ref <=55)
AST (SGOT): 16 U/L (ref <=41)
Albumin/Globulin Ratio: 1 (ref 0.9–2.2)
Albumin: 2.9 g/dL — ABNORMAL LOW (ref 3.5–4.9)
Alkaline Phosphatase: 57 U/L (ref 37–117)
Anion Gap: 9 (ref 5.0–15.0)
BUN: 15 mg/dL (ref 7–21)
Bilirubin, Total: 0.5 mg/dL (ref 0.2–1.2)
CO2: 27 meq/L (ref 17–29)
Calcium: 9 mg/dL (ref 8.5–10.5)
Chloride: 102 meq/L (ref 99–111)
Creatinine: 0.7 mg/dL (ref 0.4–1.0)
GFR: >60 mL/min/1.73 m2 (ref >=60.0)
Globulin: 2.8 g/dL (ref 2.0–3.6)
Glucose: 97 mg/dL (ref 70–100)
Potassium: 4.1 meq/L (ref 3.5–5.3)
Protein, Total: 5.7 g/dL — ABNORMAL LOW (ref 6.0–8.3)
Sodium: 138 meq/L (ref 135–145)

## 2024-03-04 LAB — URINALYSIS WITH REFLEX TO MICROSCOPIC EXAM - REFLEX TO CULTURE
Urine Bilirubin: NEGATIVE
Urine Blood: NEGATIVE
Urine Glucose: NEGATIVE
Urine Ketones: NEGATIVE mg/dL
Urine Leukocyte Esterase: NEGATIVE
Urine Nitrite: NEGATIVE
Urine Protein: NEGATIVE
Urine Specific Gravity: 1.021 (ref 1.001–1.035)
Urine Urobilinogen: NORMAL mg/dL (ref 0.2–2.0)
Urine pH: 6 (ref 5.0–8.0)

## 2024-03-04 LAB — LAB USE ONLY - URINE GRAY CULTURE HOLD TUBE

## 2024-03-04 MED ORDER — NALOXONE HCL 0.4 MG/ML IJ SOLN (WRAP)
0.4000 mg | INTRAMUSCULAR | Status: DC | PRN
Start: 2024-03-04 — End: 2024-03-13

## 2024-03-04 NOTE — Plan of Care (Signed)
 Inpatient Rehabilitation Nursing Admission Assessment and Care Plan    Patient Name:  Jennifer [name]       Medical Record Number: [id number]   Date of Birth: 09/16/1955    Rehab Diagnosis: Seizures (CMS/HCC) [R56.9]    Demographics:68 y.o. female    Date of admission: 03/03/2024     Past Medical History:   Medical History[1]     Pain Assessment:   Charting Type: Assessment  Pain Scale Used: Numeric Scale (0-10)  Pain Score: 9-severe pain  POSS Score: Awake and Alert  Pain Location: Shoulder  Pain Orientation: Right  Pain Descriptors: Crushing, Discomfort  Pain Frequency: Increases with movement, Continuous  Effect of Pain on Daily Activities: moderate  Patient's Stated Comfort Functional Goal: 0-No pain  Pain Intervention(s): Medication, Rest  Multiple Pain Sites: No    Nutrition:  Diet Type: Regular, Thin (IDDSI level 0)  Feeding Route: Other (Comment) (po)  Feeding: Able to feed self  Percent Meal Consumed (%): 0%     Neurological Assessment:        Delayed responses    Integumentary:  Integumentary (WDL): All findings assessed as WDL except as noted  General Skin Color: Appropriate for ethnicity  Skin Temp: Warm, Dry  Skin Assessment: Surgical incision, Blanchable Redness, Bruising, Scars  Image: Images linked  Bruising Skin Location: scattered, buttocks  Scar Location: scattered  Blanchable Redness Location: bilat heels  Surgical Incision Location: Rt shoulder    Braden Scale: Sensory Perceptions: No impairment  Moisture: Occasionally moist  Activity: Chairfast  Mobility: Slightly limited  Nutrition: Adequate  Friction and Shear: No apparent problem  Braden Scale Score: 18    Braden Skin Interventions: Moisture level Interventions: Moisture wicking products, Moisture barrier cream  Activity/Mobility Interventions: Pad bony prominences, TAP Seated positioning system when OOB, Promote PMP, Reposition q 2 hrs / turn clock, Offload heels           Care Tool Scoring:    Current   Status Current Status   Functional  Area: Care Score:  Comments:   Eating 5    Toileting hygiene 3    Rolling left to right 3    Sit to Lying 3    Lying to Sitting on Side of Bed 3    Sit to Stand 3    Chair/Bed-to-Chair Transfer 2    Toilet transfer 3      Guidelines-Falls Prevention:  Interdisciplinary Interventions  Patient's current fall risk color? Yellow  Red/Yellow/Green/Blue Safety Interventions YELLOW LEVEL safety interventions  Safety interventions provided this shift (YELLOW)?: Bed alarm engaged, Chair alarm engaged, Personal items within patient reach, Call bell accessible and within patient reach  Shift Fall Risk Update Fall prevention level change this shift?: No    Education  Education Documentation  No documentation found.  Education Comments  No comments found.    4 eyes in 4 hours pressure injury assessment note:      Completed with:   Unit & Time admitted:              Bony Prominences: Check appropriate box; if Pressure Injury is present enter Pressure Injury assessment in LDA    Occiput:                    []  Pressure Injury present  Face:                        []  Pressure Injury present  Ears:                         []   Pressure Injury present  Spine:                       []  Pressure Injury present  Shoulders:                [x]  Pressure Injury present Right shoulder ( surgical incision)   Elbows:                     []  Pressure Injury present  Sacrum/coccyx:        []  Pressure Injury present  Ischial Tuberosity:    []  Pressure Injury present  Trochanter/Hip:        []  Pressure Injury present  Knees:                      []  Pressure Injury present  Ankles:                     []  Pressure Injury present  Heels:                       []  Pressure Injury present  Other pressure areas: []  Pressure Injury location       Device related: []  Device name:         LDA completed if pressure injury present: yes/no  Consult WOCN if necessary    Other skin related issues, ie tears, rash, etc, document in Integumentary  flowsheet        Interdisciplinary Care Plan Goals and Interventions  Encounter Problems       Encounter Problems (Active)       Compromised Activity/Mobility       Activity/Mobility Interventions       Start:  03/03/24    Expected End:  04/04/24       Interventions:  1. Pad bony prominences, TAP Seated positioning system when OOB, Promote PMP, Reposition q 2 hrs/turn clock, Offload heels       Initial    Activity/Mobility Interventions: Pad bony prominences, TAP Seated positioning system when OOB, Promote PMP, Reposition q 2 hrs / turn clock, Offload heels            Compromised Moisture       Moisture level Interventions       Start:  03/03/24    Expected End:  04/04/24       Interventions:  1. Moisture wicking products, Moisture barrier cream       Initial    Moisture level Interventions: Moisture wicking products, Moisture barrier cream            Pain Management       LTG: Patient will verbalize pain relief less than 2/10 with the use of pain medication and nonpharmacological pain measures such as deep breathing, relaxation techniques and repositioning 100% of the time       Start:  03/04/24    Expected End:  04/04/24               Pain interferes with ability to perform ADL       Pain at adequate level as identified by patient       Start:  03/03/24    Expected End:  04/04/24       Interventions:  1. Identify patient comfort function goal  2. Evaluate if patient comfort function goal is met  3. Assess pain on admission, during daily assessment and/or before any as needed intervention(s)  4. Reassess pain within 30-60 minutes of any procedure/intervention, per Pain Assessment, Intervention, Reassessment (AIR) Cycle  5. Evaluate patient's satisfaction with pain management progress  6. Offer non-pharmacological pain management interventions  7. Consult/collaborate with Pain Service  8. Consult/collaborate with Physical Therapy, Occupational Therapy, and/or Speech Therapy  9. Assess for risk of opioid induced  respiratory depression and side effects, including snoring/sleep apnea. Alert healthcare team of risk factors identified.  10. Include patient/patient care companion in decisions related to pain management as needed            Risk of Infection       LTG: Patient incisions and abrasions will be free of any signs or symptoms of infection and will advance to healed by at least 50% by discharge date.       Start:  03/04/24    Expected End:  04/04/24               Safety Risk       LTG: Patient will acknowledge limitations and call for assistance before transferring in and out of bed and with ambulation 100% of the tim       Start:  03/04/24    Expected End:  04/04/24               Side Effects from Pain Analgesia       Patient will experience minimal side effects of analgesic therapy       Start:  03/03/24    Expected End:  04/04/24       Interventions:  1. Monitor/assess patient's respiratory status (RR depth, effort, breath sounds)  2. Assess for changes in cognitive function   3. Prevent/manage side effects per LIP orders (i.e. nausea, vomiting, pruritus, constipation, urinary retention, etc.)  4. Evaluate for opioid-induced sedation with appropriate assessment tool (i.e. POSS)            Skin Wound Management       LTG: Patient will show no evidence of skin breakdown during hospitalization.       Start:  03/04/24    Expected End:  04/04/24                   TOR FREUND, RN    Problem: Pain interferes with ability to perform ADL  Goal: Pain at adequate level as identified by patient  Outcome: Progressing     Problem: Side Effects from Pain Analgesia  Goal: Patient will experience minimal side effects of analgesic therapy  Outcome: Progressing     Problem: Compromised Moisture  Goal: Moisture level Interventions  Outcome: Progressing     Problem: Compromised Activity/Mobility  Goal: Activity/Mobility Interventions  Outcome: Progressing     Problem: Pain Management  Goal: LTG: Patient will verbalize pain relief less  than 2/10 with the use of pain medication and nonpharmacological pain measures such as deep breathing, relaxation techniques and repositioning 100% of the time  Outcome: Progressing     Problem: Skin Wound Management  Goal: LTG: Patient will show no evidence of skin breakdown during hospitalization.  Outcome: Progressing     Problem: Risk of Infection  Goal: LTG: Patient incisions and abrasions will be free of any signs or symptoms of infection and will advance to healed by at least 50% by discharge date.  Outcome: Progressing     Problem: Safety Risk  Goal: LTG: Patient will acknowledge limitations and call for assistance before transferring in and out of bed and with ambulation 100% of the  tim  Outcome: Progressing          [1]   Past Medical History:  Diagnosis Date    Abnormal vision     glasses    Arrhythmia 2018    SVT (had ablation)    Arthritis     Complication of anesthesia     urinary retention with every joint replacement, BP drop with spinal X1, another time spinal didn't work    Convulsions (CMS/HCC)     epilepsy, last seizure 2010    Deep venous thrombosis of distal lower extremity (CMS/HCC) 2018    after cholecystectomy; left leg    Diverticulitis     takes miralax     Encounter for blood transfusion 2018    Gastric ulceration 2018    GI bleed with requiring 4 units PRBCs per pt    Hyperlipidemia     Hypothyroidism     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

## 2024-03-04 NOTE — Progress Notes (Signed)
 Case Management location: onsite     Team conference held on 03/04/24. Team established estimated [facility] date as 03/13/24     Team recommending:  Home with Home Health Services     CM met with patient bedside to discuss estimated discharge date and team conference meeting. CM discussed coordinating home health RN, PT, OT and will provide patient with list of recommended follow up appointments.     Family Training requested with spouse prior to discharge    Nursing education to be completed: Pain Management     Therapy Team will review patient's DME needs with you prior to discharge.     Transport per therapy team: Car     Pick up time on day of Discharge: [facility] 9 am - 11 am     Referral sent to [facility] team for [facility] Nurse, Physical Therapy , and Occupational Therapy      Montie Levy, LMSW, ACMA-SW  Social Work Case Manager  [facility]   Phone: [phone]  Fax: [phone]  [email or web address]

## 2024-03-04 NOTE — Progress Notes (Signed)
 Therapeutic Recreation  Inpatient Rehabilitation Initial Evalution    Patient Name:  Jennifer [name]       Medical Record Number: [id number]   Date of Birth: [birth year]  Sex: Female          Room/Bed:  M555/M555.01    Rehabilitation Precautions/Restrictions:  Weight Bearing Status: RUE non weight bearing  Other Precautions: fall, seizure, sling at all times, cognitive impairments    Rehab Diagnosis: Seizures (CMS/HCC) [R56.9]     History of Present Illness:  Jennifer [name] is a 68 yo female with PMH of epilepsy, HLD, hypothyroidism, DVT, s/p IVD filter, migraines, SVT, iron deficiency anemia, knee and hip replacements, colon resection, lumbar laminectomy, adverse anesthesia effects, who presented to [facility] on 02/28/24 after mechanical fall in shower.  Patient had recent right shoulder replacement on 02/26/24 (NWB to RUE with sling) and accidentally overdosed on her medications. Patient stated she felt like she might have been having aura that she sees prior to her having a seizure.     CT head showed no acute abnormalities. 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 Soft tissue gas along the right shoulder joint is favored to be postsurgical related. No acute displaced fracture or malalignment. Xray of right arm showed no acute findings.     Neurology was consulted. Ataxia attributed to due to medication overdosage versus other causes. Patient also is on clonazepam  1 mg 3 times daily and gabapentin  300 mg 4 times daily  MRI of the brain negative for acute abnormality.   Continue Keppra  1500 mg twice daily per home dosage  Continue lacosamide  200 mg 2 times daily per home dosage     Patient presents with unsteady gait, impaired balance, decreased strength, impaired functional mobility, decreased activity tolerance, orthopedic restrictions, and decreased ROM s/p R reverse TSA with 3 subsequent falls at home since 10/1.  The patient has been working well with  therapy services to include PT and OT to address functional mobility, self care deficit, and pain.  The patient would benefit from an intensive level of rehab along with close medical management of multiple medical conditions and rehab MD to drive the rehab process.  The patient is medically stable for IRF level of care.       Past Medical History:   Medical History[1]    Subjective   Patient/Caregiver Goals: Stop falling.  Therapeutic Recreation  Intellectual Interests: Reading, Puzzles  Home Activities: Television  Activities: Relaxation therapy, Music therapy, Exercise, Arts/Crafts    Objective   Encounter Problems       Encounter Problems (Active)       Endurance/Activity Tolerance       Pt will improve strength and endurance as demonstrated through the ability to perform and tolerate leisure activities upon discharge.  (Progressing)       Start:  03/04/24               Leisure Skills       Pt will participate in a leisure activity of choice with mod I upon discharge.  (Progressing)       Start:  03/04/24                   Assessment & Plan   Jennifer [name] is expected to benefit from Therapeutic Recreation (TR) services by addressing areas such as improving endurance and activity tolerance, enhancing independence and engagement in leisure activities, and promoting the psychosocial benefits of leisure--including stress  reduction, social interaction, and overall emotional well-being.   Groups appropriate for patient:        Group Justification: Integrate and carry over functional social-pragmatic language skills through conversations with peers  Practice /carry over skills learned in individual sessions with new partners, clinicians, and context  Emotional and/or spiritual support from peers experiencing similar circumstances           [1]   Past Medical History:  Diagnosis Date    Abnormal vision     glasses    Arrhythmia 2018    SVT (had ablation)    Arthritis     Complication of anesthesia     urinary  retention with every joint replacement, BP drop with spinal X1, another time spinal didn't work    Convulsions (CMS/HCC)     epilepsy, last seizure 2010    Deep venous thrombosis of distal lower extremity (CMS/HCC) 2018    after cholecystectomy; left leg    Diverticulitis     takes miralax     Encounter for blood transfusion 2018    Gastric ulceration 2018    GI bleed with requiring 4 units PRBCs per pt    Hyperlipidemia     Hypothyroidism     

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
## 2024-03-04 NOTE — Plan of Care (Signed)
 Problem: Pain Management  Goal: LTG: Patient will verbalize pain relief less than 2/10 with the use of pain medication and nonpharmacological pain measures such as deep breathing, relaxation techniques and repositioning 100% of the time  Outcome: Progressing:  Patient tolerates her pain medication well and its effective. She is aware to inform nurse if the need for pain medication arise.      Problem: Skin Wound Management  Goal: LTG: Patient will show no evidence of skin breakdown during hospitalization.  Outcome: Progressing:  Patient's surgical site remains intact. No concerns at this time.     Problem: Safety Risk  Goal: LTG: Patient will acknowledge limitations and call for assistance before transferring in and out of bed and with ambulation 100% of the tim  Outcome: Progressing:  Patient remains safe in her room with call light and frequently used items placed within her reach.
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
## 2024-03-04 NOTE — PT Eval Note (Signed)
 Physical Therapy  Inpatient Rehabilitation Initial Evaluation    Patient Name:  Jennifer [name]       Medical Record Number: [id number]   Date of Birth: [birth year]  Sex: Female          Room/Bed:  M555/M555.01    Therapy Received:  Start Time: 1000   Stop Time: 1100  Total Therapy Minutes: 60    Rehabilitation Precautions/Restrictions:  Weight Bearing Status: RUE non weight bearing  Other Precautions: fall, seizure, sling at all times, cognitive impairments    Rehab Diagnosis: Seizures (CMS/HCC) [R56.9]     History of Present Illness: Per preadmission screen: Jennifer [name] is a 68 yo female with PMH of epilepsy, HLD, hypothyroidism, DVT, s/p IVD filter, migraines, SVT, iron deficiency anemia, knee and hip replacements, colon resection, lumbar laminectomy, adverse anesthesia effects, who presented to [facility] on 02/28/24 after mechanical fall in shower.  Patient had recent right shoulder replacement on 02/26/24 (NWB to RUE with sling) and accidentally overdosed on her medications. Patient stated she felt like she might have been having aura that she sees prior to her having a seizure.     CT head showed no acute abnormalities. 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 Soft tissue gas along the right shoulder joint is favored to be postsurgical related. No acute displaced fracture or malalignment. Xray of right arm showed no acute findings.     Neurology was consulted. Ataxia attributed to due to medication overdosage versus other causes. Patient also is on clonazepam  1 mg 3 times daily and gabapentin  300 mg 4 times daily  MRI of the brain negative for acute abnormality.   Continue Keppra  1500 mg twice daily per home dosage  Continue lacosamide  200 mg 2 times daily per home dosage     Patient presents with unsteady gait, impaired balance, decreased strength, impaired functional mobility, decreased activity tolerance, orthopedic restrictions, and decreased ROM s/p  R reverse TSA with 3 subsequent falls at home since 10/1.  The patient has been working well with therapy services to include PT and OT to address functional mobility, self care deficit, and pain.  The patient would benefit from an intensive level of rehab along with close medical management of multiple medical conditions and rehab MD to drive the rehab process.  The patient is medically stable for IRF level of care.    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 5+2 stairs down to basement with single HR (R needs clarificaiton on ascending/descending))  Bathroom Shower/Tub: Walk-in shower (walk in shower with a lip)  Bathroom Toilet: Standard  Bathroom Equipment: Paediatric nurse  DME Currently at Home: ADL- Paediatric nurse  Home Living - Notes / Comments: daughter and son in law live upstairs, both work from home during the day  Subjective   Patient Report: What are we going to do about my hair  Patient/Caregiver Goals: Stop falling.  Pain Assessment:  Pain Assessment: Numeric Scale (0-10)  Pain Score: 2-mild pain  Pain Location: Shoulder  Pain Orientation: Right  Pain Descriptors: Aching  Pain Frequency: Intermittent  Pain Intervention(s): Distraction    Objective    Vitals:  Heart Rate: 68  BP: 106/70  BP Location: Left arm  BP Method: Automatic  MAP (mmHg): 82  Patient Position: Sitting      Interventions:     Orders received. Chart review completed. Handoff from RN who report pt is appropriate for eval. Pt received supine in bed. Agreeable to therapy. Extended time required to orient pt to PT and collect history. Very very verbose requiring redirection to conversation. Perseverative on hair.     Of note pt rasing concerns of home dynamics - informed treatment team and neuropsychologist.     Rest of session focused on assessing functional mobility via CARE tool, see below.     Mobility Functional Status:   Current   Status Current Status Discharge Goal   Functional Area: Care Score:  Comments:    Roll Left and Right 88 Did not assess due to NWB on R side and pain in shoulder Independent (To L, will not roll to R due ot precautions)   Sit to Lying 3 mod A at LE Independent   Lying to Sitting on Side of Bed 2 Max A at trunk Independent   Sit to Stand 3 Min A to no AD Independent   Chair/Bed-to-Chair Transfer 3 Min A stand pivot no AD Independent   Car Transfer 88 Unable to assess due to difficutly following directions and poor safety awareness Independent   Walk 10 Feet 3 Min A for steady support hemiwalker Independent   Walk 50 Feet with Two Turns 3 Min A for steady support hemiwalker. Very slow gait speed, very distractable Independent   Walk 10 Feet on Uneven Surface 88 Unable to assess due to difficutly following directions and poor safety awareness Independent   Walk 150 Feet 88 Pt required rest break after 50' due to fatigue Independent   1 Step (Curb) 88 Unable to assess due to difficutly following directions and poor safety awareness Independent   4 Steps 88 Unable to assess due to difficutly following directions and poor safety awareness Independent   12 Steps 88 Unable to assess due to difficutly following directions and poor safety awareness Supervision or touching  assistance   Wheel 50 Feet with Two Turns        Wheel 150 Feet          Assessment:  Cognition: Impairments noted in long term memory, attention, processing, and safety awareness. See speech eval for more detailed assessment.     Gross ROM  Right Lower Extremity ROM: within functional limits  Left Lower Extremity ROM: within functional limits    RLE Modified Ashworth Scale  RLE Modified Ashworth Completed: No  LLE Modified Ashworth Scale  LLE Modified Ashworth Completed: No    Gross Strength  Right Lower Extremity Strength: within functional limits  Left Lower Extremity Strength: within functional limits    Sensation  Sensation: WFL BLE  Proprioception: WFL BLE    Motor Control  Gross: WFL BLE    Balance  Balance: needs focused assessment  Sitting - Static: Fair  Sitting - Dynamic: Fair  Standing - Static: Fair  Standing - Dynamic: Fair    Encounter  Problems       Encounter Problems (Active)       PT - General and Outcome Measures       LTG: Patient will complete supine to/from sit transfer independently to maximize independence with bed mobility/decrease burden of care       Start:  03/04/24            LTG: Patient will complete independently to maximize independence and prevent falls.       Start:  03/04/24            LTG: Patient will walk 50 ft independently with LRAD to access areas of their home and community/decrease burden of care       Start:  03/04/24            LTG: Patient will ascend/descend 12 stairs with single HR and supervision to safely and independently access their home/community       Start:  03/04/24            LTG: Patient will ascend/descend 2 stairs with no HR and SPV to safely and independently access their home/community       Start:  03/04/24            LTG: Patient will improve score on Berg Balance Assessment by 7 points in order to demonstrate decrease risk for falls as compared to initial Berg Balance Assessment score       Start:  03/04/24                   Education  Documentation  Safety issues and interventions, taught by Fernando Quarry, PT at 03/04/2024 10:00 AM.  Learner: Patient  Readiness: Acceptance  Method: Explanation, Demonstration  Response: [place] Understanding, Demonstrated Understanding    Rehab techniques/procedure, taught by Fernando Quarry, PT at 03/04/2024 10:00 AM.  Learner: Patient  Readiness: Acceptance  Method: Explanation, Demonstration  Response: Verbalizes Understanding, Demonstrated Understanding    Plan of care, taught by Fernando Quarry, PT at 03/04/2024 10:00 AM.  Learner: Patient  Readiness: Acceptance  Method: Explanation, Demonstration  Response: Verbalizes Understanding, Demonstrated Understanding    Education Comments  No comments found.        Assessment & Plan   Assessment: Decreased UE ROM, Decreased endurance/activity tolerance, Impaired motor control, Decreased functional mobility, Decreased balance, Gait impairment    Pt is a 68 year old s/p multiple falls after recent TSR (02/26/24) now presenting to [facility] inpatient rehab with impairments in balance, gait, cognition, and endurance which all impact pts ability to function at previous independent level. Although, after recent surgery pt is NWB on RUE requiring increased assistance for self care. Pt currently requires physical assist for all functional mobility due to impairments above. Pt would benefit from skilled physical therapy services with an interdisciplinary approach to decrease burden of care and increase independence. Anticipate LOS of 7-10 days with goal of of returning to mod I LOF.      Plan:  Risks/Benefits/POC Discussed with Pt/Family: With patient/family  Treatment/Interventions: Exercise, Gait training, Stair training, Functional transfer training, LE strengthening/ROM, Endurance training, Cognitive reorientation, Patient/family training, Compensatory technique education    Recommendation:  Discharge Recommendation: Home with supervision, Home with home health PT  PT  Therapy Recommendation: 5-6 days/week, 60-120 mins/day, 1:1 treatment, Group therapy  PT Estimated Length of Stay: 7-10 days from eval 10/8    Groups appropriate for patient:   Other (not GG appropriate)         [1]  Past Medical History:  Diagnosis Date    Abnormal vision     glasses    Arrhythmia 2018    SVT (had ablation)    Arthritis     Complication of anesthesia     urinary retention with every joint replacement, BP drop with spinal X1, another time spinal didn't work    Convulsions (CMS/HCC)     epilepsy, last seizure 2010    Deep venous thrombosis of distal lower extremity (CMS/HCC) 2018    after cholecystectomy; left leg    Diverticulitis     takes miralax     Encounter for blood transfusion 2018    Gastric ulceration 2018    GI bleed with requiring 4 units PRBCs per pt    Hyperlipidemia     Hypothyroidism     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

## 2024-03-04 NOTE — SLP Eval Note (Addendum)
 Speech Language Pathology  Inpatient Rehabilitation Evaluation Note    Patient Name:  Jennifer [name]       Medical Record Number: [id number]   Date of Birth: 30-Apr-1956  Sex: Female          Room/Bed:  M555/M555.01    Therapy Received:  Start Time: 1100   Stop Time: 1200  Total Therapy Minutes: 60    Rehabilitation Precautions/Restrictions:  Weight Bearing Status: RUE non weight bearing  Other Precautions: fall, seizure, sling at all times, cognitive impairments    Rehab Diagnosis: Seizures (CMS/HCC) [R56.9]     History of Present Illness: Jennifer [name] is a 68 yo female with PMH of epilepsy, HLD, hypothyroidism, DVT, s/p IVD filter, migraines, SVT, iron deficiency anemia, knee and hip replacements, colon resection, lumbar laminectomy, adverse anesthesia effects, who presented to [facility] on 02/28/24 after mechanical fall in shower.  Patient had recent right shoulder replacement on 02/26/24 (NWB to RUE with sling) and accidentally overdosed on her medications. Patient stated she felt like she might have been having aura that she sees prior to her having a seizure.     CT head showed no acute abnormalities. 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 Soft tissue gas along the right shoulder joint is favored to be postsurgical related. No acute displaced fracture or malalignment. Xray of right arm showed no acute findings.     Neurology was consulted. Ataxia attributed to due to medication overdosage versus other causes. Patient also is on clonazepam  1 mg 3 times daily and gabapentin  300 mg 4 times daily  MRI of the brain negative for acute abnormality.   Continue Keppra  1500 mg twice daily per home dosage  Continue lacosamide  200 mg 2 times daily per home dosage     Patient presents with unsteady gait, impaired balance, decreased strength, impaired functional mobility, decreased activity tolerance, orthopedic restrictions, and decreased ROM s/p R reverse TSA with  3 subsequent falls at home since 10/1.  The patient has been working well with therapy services to include PT and OT to address functional mobility, self care deficit, and pain.  The patient would benefit from an intensive level of rehab along with close medical management of multiple medical conditions and rehab MD to drive the rehab process.  The patient is medically stable for IRF level of care.    Past Medical History:   Medical History[1]    Relevant Speech Therapy History  Speech Therapy History Comments: no hx of SLP    BIMS/SAM Assessment      INPATIENT REHABILITATION FACILITY - PATIENT ASSESSMENT INSTRUMENT QUALITY INDICATORS     INPATIENT REHABILITATION FACILITY - PATIENT ASSESSMENT INSTRUMENT QUALITY INDICATORS: COGNITIVE PATTERNS    Cognitive Pattern Assessment Used: BIMS   Repetition of Three Words (First Attempt): 3   Temporal Orientation: Year: Correct   Temporal Orientation: Month: Accurate within 5 days  Temporal Orientation: Day: Correct  Recall: Sock: No, could not recall  Recall: Blue: Yes, no cue required  Recall: Bed: No, could not recall  BIMS Summary Score: 11       BIMS/SAM smart phrase has been refreshed to show my new assessment values? yes       Subjective   Patient Report: I had to take some pain medication for my shoulder. Agreeable to participation. Appreciate RN handoff with no medical concerns with pt participation in SLP services.  Patient/Caregiver Goals: Stop falling.  Prior Level of Function: Lives with husband.  Retired from job as a Retail banker. Some responsibility for caring for grandchildren. Completes IADLs ind.  Pain:  Pain Assessment: Numeric Scale (0-10)  Pain Score: 2-mild pain  POSS Score: Slightly drowsy, easily aroused  Pain Location: Shoulder    Objective     Hearing, Speech, and Vision:  Ability to Hear: Adequate  Expression of Ideas and Wants: Without difficulty  Understanding Verbal and Non-Verbal Content: Understands     Health Literacy:  How often  do you need to have someone help you when you read instructions, pamphlets, or other written material from your doctor or pharmacy?: Rarely    Evaluation:  Language/Cognition/Swallow:   Cognition  Oriented to: Self, Date, Place, Situation (generally to place - rehab)  Sustained Attention: Moderately impaired (primarily appearing to be mitigated by fatigue today )  Selective Attention: Mild-moderately impaired (for finding target medication info on label, topic maintenance)  Processing: Mildly impaired (suspect primarily due to foundation deficits in attention)  Short Term Memory: WFL (Recalls tasks completed during PT/OT, hospitalization information (incl MRI completion), and medications taken earlier today )    Verbal Expression  Conversational Level: WFL  Discourse Level: Reduced topic maintenance; suspect due to selective attention deficits    Assessments:  Self - Anchored Rating Scale re: Thinking Skills was introduced in order for patient to self-assess previous, current, and future goals.   10:   9:   8: Day of evaluation: I think part of it is the medicine.  Prior to hospitalization: I think the medication does impact me. But I think I need it.  7:   6:  5:   4:   3:   2:   1:      03/04/24 1131   Assessment of Language-Related Functional Activities (ALFA)   Understanding Medicine Labels (UM) Score 8   Understanding Medicine Labels (UM) Functional Impairment 1: high probability of independent functioning on this task   Using a Calendar (UC) Score 8   Using a Calendar (UC) Functional Impairment 1: high probability of independent functioning on this task   Reading Instructions (RI) Score   (attempted but discontinued due to fatigue)     Education Documentation  No documentation found.  Education Comments  No comments found.    Education regarding SLP scope of practice, assessment results, multifactorial cognitive presentation    Assessment & Plan   Assessment: Other (Comment) (see assessment below)  68 y/o  admitted to acute rehab following hospitalization for fall in shower, with recent shoulder replacement. SLP consulted due to team's concerns for presentation that may be c/w cognitive linguistic deficits, including hyperverbosity, impaired topic maintenance, and processing changes. SLP evaluation today  is significant for similar symptoms, including impairments in selective attention and processing. Pt reports her cognitive linguistic function is currently c/w her baseline (see self-anchored rating scale above), which seems reasonable given no neurological events during hospitalization. Suspect cognitive linguistic presentation is multifactorial: medication effects (had recently taken oxycodone  for pain), fatigue, and pt reported anxiety. At this time, anticipating this will be best managed by medical team and/or neuropsychologist, given suspected etiologies. No SLP services recommended during AR.     Communicated with MD, PT/OT, RN, and neuropsych regarding assessment results and recommendations for management in therapy sessions: reducing distractions, identifying specific goals or sub-parts of a task (I.e. focus on moving your left leg more), making sure you have her attention before starting something, and even shortening phrases/have her say back to you what she's focusing on. May consider SLP co-tx  if needed.    Plan:  Risks/Benefits/POC Discussed with Pt/Family: With patient  Treatment/Interventions: Cognitive evaluation    Recommendation:  Discharge Recommendation: Other (Comment) (per PT/OT)  SLP Therapy Recommendation: Discharge SLP services           [1]   Past Medical History:  Diagnosis Date    Abnormal vision     glasses    Arrhythmia 2018    SVT (had ablation)    Arthritis     Complication of anesthesia     urinary retention with every joint replacement, BP drop with spinal X1, another time spinal didn't work    Convulsions (CMS/HCC)     epilepsy, last seizure 2010    Deep venous thrombosis of distal  lower extremity (CMS/HCC) 2018    after cholecystectomy; left leg    Diverticulitis     takes miralax     Encounter for blood transfusion 2018    Gastric ulceration 2018    GI bleed with requiring 4 units PRBCs per pt    Hyperlipidemia     Hypothyroidism     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

## 2024-03-04 NOTE — Progress Notes (Signed)
 CM met with patient at bedside.   During the conversation discussed the role of CM, reviewed information on the [employer]. Shared Unit phone number.    CM explained Team conferences are held on Wednesday 11 am to discuss current functional status, barriers to discharge, estimated length of stay, and discharge recommendations.     Discussed Family Meeting and will schedule as needed.     CM explained importance of family training, within 7-10 days of admission    Discussed referrals for Home health vs Outpatient therapy based on team's recommendations.        CM requested that patient/Significant other update the bedside/charge nurse/ CM if there are any outside appointments scheduled during rehab admission. CM will continue to follow patient for a safe, effective, efficient discharge plan.    Patient resides with spouse, daughter and son in law in [place]; Performs ADL's on one level; Stairs to enter without rails 2 STE with no rails. 5+2 stairs down to basement with single HR (R needs clarificaiton on ascending/descending).   Patient identified her spouse and children as her main support system ( 1 daughter [place], son in [place] and resides with daughter [place]).  Patient was previously able to drive self to appointments, able to complete ADL, IADL.  Patient has some DME for home use. Patient's spouse identified as best for family training.       Pharmacy: [phone], [place] - [phone] [phone]       Follow up: PCP, Neurology (seizure), ortho surgery       03/04/24 1603   Patient Type   Within 30 Days of Previous Admission? Yes   Healthcare Decisions   Interviewed: Patient   Orientation/Decision Making Abilities of Patient Alert and Oriented x3, able to make decisions   Prior to admission   Type of Residence Private residence   Have running water , electricity, heat, etc? Yes   How do you get to your MD appointments? Self and spouse   How do you get your  groceries? Self and spouse   Who fixes your meals? Self and spouse   Who does your laundry? Self and spouse   Who picks up your prescriptions? Self and spouse   Health visitor None   Discharge Planning   Support Systems Spouse/significant other   Expected Discharge Disposition HH Services   Anticipated [place] plan discussed with: Same as interviewed   Potential barriers to discharge: Decreased mobility   Mode of transportation: Private car (family member)   Does the patient have perscription coverage? Yes   Financial Resource Strain   How hard is it for you to pay for the very basics like food, housing, medical care, and heating? Not hard   Housing   Do you have housing? Y   Are you worried about losing your housing? N   Transportation Needs   In the past 12 months, has lack of transportation kept you from medical appointments or from getting medications? no   In the past 12 months, has lack of transportation kept you from meetings, work, or from getting things needed for daily living? No   Family and PCP   PCP on file was verified as the current PCP? Yes   In case you are admitted, transferred or discharged, would like family notified? Unknown   In case you are admitted, transferred or discharged, would like your PCP notified? Unknown  Montie Levy, LMSW, ACMA-SW  Social Work Case Manager  [facility]   Phone: [phone]  Fax: [phone]  [email or web address]

## 2024-03-04 NOTE — Care and Service Plan (Signed)
 Team Conference Note    Patient Name:  Jennifer [name]       Medical Record Number: [id number]   Date of Birth: 08-23-1955  Sex: Female          Room/Bed:  M555/M555.01    Admitting Diagnosis: Seizures (CMS/HCC) [R56.9]   Admit Date/Time:  03/03/2024  6:53 PM  Admission Comments: No comment available     Primary Diagnosis: Seizures (CMS/HCC)    Patient Active Problem List    Diagnosis Date Noted    Seizures (CMS/HCC) 03/03/2024    Morbid obesity (CMS/HCC) 04/26/2023    Morbid obesity with BMI of 45.0-49.9, adult (CMS/HCC) 04/10/2023    DDD (degenerative disc disease), lumbosacral 12/26/2022    Prediabetes 12/26/2022    Class 3 severe obesity with serious comorbidity and body mass index (BMI) of 40.0 to 44.9 in adult, unspecified obesity type (CMS/HCC) 12/26/2022    Seizure disorder (CMS/HCC) 12/26/2022    SVT (supraventricular tachycardia) 12/26/2022    History of colon resection 12/26/2022    History of cholecystectomy 12/26/2022    History of maternal deep vein thrombosis (DVT) 12/26/2022    Ventricular tachyarrhythmia (CMS/HCC) 04/06/2022    Hypertriglyceridemia 04/06/2022    Hypothyroidism, unspecified type 04/06/2022    Mass of right breast, unspecified quadrant 09/25/2021    Primary osteoarthritis of left knee 02/12/2020       Vital Signs  Blood Pressure: 106/70  Temperature: 98.1 F (36.7 C)  Pulse: 68  Respirations: 18  Pain Scale Used: Numeric Scale (0-10)  Pain Score: 0  Pain Location: Shoulder  Pain Orientation: Right  Pain Descriptors: Aching; Discomfort  Pain Frequency: Intermittent; Increases with movement      Weight and Nutrition  Admission Weight: 101 kg (222 lb 10.6 oz)  Current Weight: 101 kg (222 lb 10.6 oz)  Diet Type: Regular, Thin (IDDSI level 0)    Plan of Care  Anticipated Discharge Date: 03/13/24  Discharge Plan: Home  Fall Risk Level: Yellow  Is the duration of therapy 15 hours over 7 days instead of the standard 3 hours of therapy, 5 out of 7 days a week?: No  The patient will benefit  from continued services by:   Physical Therapy Recommendation: 60-120 mins/day, 1:1 treatment, 5-6 days/week  Occupational Therapy Recommendation: 60-120 mins/day, 1:1 treatment, 5-6 days/week  Speech Language Pathology Recommendation: Evaluation in progress      The following is a list of patient problems that have been identified by the interdisciplinary team:   Encounter Problems       Encounter Problems (Active)       Compromised Activity/Mobility       Activity/Mobility Interventions (Progressing)       Start:  03/03/24    Expected End:  04/04/24       Interventions:  1. Pad bony prominences, TAP Seated positioning system when OOB, Promote PMP, Reposition q 2 hrs/turn clock, Offload heels       Initial Last    Activity/Mobility Interventions: Pad bony prominences, TAP Seated positioning system when OOB, Promote PMP, Reposition q 2 hrs / turn clock, Offload heels Pad bony prominences, TAP Seated positioning system when OOB, Promote PMP, Reposition q 2 hrs / turn clock, Offload heels            Compromised Moisture       Moisture level Interventions (Progressing)       Start:  03/03/24    Expected End:  04/04/24       Interventions:  1.  Moisture wicking products, Moisture barrier cream       Initial Last    Moisture level Interventions: Moisture wicking products, Moisture barrier cream Moisture wicking products, Moisture barrier cream            Pain Management       LTG: Patient will verbalize pain relief less than 2/10 with the use of pain medication and nonpharmacological pain measures such as deep breathing, relaxation techniques and repositioning 100% of the time (Progressing)       Start:  03/04/24    Expected End:  04/04/24               Pain interferes with ability to perform ADL       Pain at adequate level as identified by patient (Progressing)       Start:  03/03/24    Expected End:  04/04/24       Interventions:  1. Identify patient comfort function goal  2. Evaluate if patient comfort function goal  is met  3. Assess pain on admission, during daily assessment and/or before any as needed intervention(s)  4. Reassess pain within 30-60 minutes of any procedure/intervention, per Pain Assessment, Intervention, Reassessment (AIR) Cycle  5. Evaluate patient's satisfaction with pain management progress  6. Offer non-pharmacological pain management interventions  7. Consult/collaborate with Pain Service  8. Consult/collaborate with Physical Therapy, Occupational Therapy, and/or Speech Therapy  9. Assess for risk of opioid induced respiratory depression and side effects, including snoring/sleep apnea. Alert healthcare team of risk factors identified.  10. Include patient/patient care companion in decisions related to pain management as needed            Risk of Infection       LTG: Patient incisions and abrasions will be free of any signs or symptoms of infection and will advance to healed by at least 50% by discharge date. (Progressing)       Start:  03/04/24    Expected End:  04/04/24               Safety Risk       LTG: Patient will acknowledge limitations and call for assistance before transferring in and out of bed and with ambulation 100% of the tim (Progressing)       Start:  03/04/24    Expected End:  04/04/24               Self Care Management       LTG: Pt will complete daily grooming routine with mod I       Start:  03/04/24    Expected End:  03/11/24               Side Effects from Pain Analgesia       Patient will experience minimal side effects of analgesic therapy (Progressing)       Start:  03/03/24    Expected End:  04/04/24       Interventions:  1. Monitor/assess patient's respiratory status (RR depth, effort, breath sounds)  2. Assess for changes in cognitive function   3. Prevent/manage side effects per LIP orders (i.e. nausea, vomiting, pruritus, constipation, urinary retention, etc.)  4. Evaluate for opioid-induced sedation with appropriate assessment tool (i.e. POSS)            Skin Wound  Management       LTG: Patient will show no evidence of skin breakdown during hospitalization. (Progressing)       Start:  03/04/24    Expected End:  04/04/24                   Rehab Team Discussion        Self Care Functional Status:   Admission Assessment Current Status Discharge  Goal   Functional Area: Care Score:  Care Score:    Eating 5 3 (03/04/24 0800) 6   Oral Hygiene 4 4 (03/04/24 0800) 6   Toileting Hygiene 3 3 (03/04/24 0800) 6   Shower/Bathe Self 3 3 (03/04/24 0800) 6   Upper Body Dressing 2 2 (03/04/24 0800) 6   Lower Body Dressing 2 2 (03/04/24 0800) 6   Putting On/Taking Off Footwear 3 3 (03/04/24 0800) 6       Mobility Functional Status:   Admission Assessment Current   Status  Discharge   Goal   Functional Area: Care Score:  Care Score:    Roll Left and Right 3 3 (03/04/24 0432)     Sit to Lying 3 3 (03/04/24 0432)     Lying to Sitting on Side of Bed 3 3 (03/04/24 0431)     Sit to Stand 3 3 (03/04/24 0432)     Chair/Bed-to-Chair Transfer 2 2 (03/03/24 2300)     Toilet Transfer 3 4 (03/04/24 0800) 6   Car Transfer         Walk 10 Feet         Walk 50 Feet with Two Turns         Walk 150 Feet         Walking 10 Feet on Uneven Surfaces         1 Step (Curb)         4 Steps         12 Steps         Wheel 50 Feet with Two Turns         Wheel 150 Feet         Picking Up Object 4 4 (03/04/24 0800) Independent     As the rehab MD, I led this interdisciplinary meeting and concur with its results and findings.   Rea Ozell RAMAN, MD

## 2024-03-05 NOTE — Progress Notes (Signed)
 PHYSICAL MEDICINE AND REHABILITATION  PROGRESS NOTE -- FACE-TO-FACE ENCOUNTER    Date Time: 03/05/24 3:21 PM  Patient Name: Jennifer [name], Jennifer [name]    Admission date:  03/03/2024  (LOS: 2 days)    Subjective:     Patient had BM yesterday, no complaints of dizziness today .  Patient with right shoulder pain, no complaint chest or abdominal pain.  Patient was walking in therapy with [name] left hemiwalker.  Patient's husband was present today  during therapy.  Canceled [name] follow-up with surgeon, rescheduled for next week.    Functional Status:     Pt completed supine > sit with SPV and use of bed rails. Noted BP to drop upon sitting- therefore, pt completed dressing at EOB for safety. Pt is asymptomatic. Please see CARETool scores below for more details:     Noted pt to be retropulsive during static sitting requiring OT to instruct pt to lean forward. Pt also with multiple posterior LOB while standing- resulting in pt to sit back down at EOB.      Pt completed sit > supine with SPV.      Patient Disposition: in room, in bed  Safety Interventions: handoff to nurse, bed alarm activated, oriented to call bell and placed within reach, personal items within reach, assistive devices out of reach, bed placed in lowest position        Self Care Functional Status:    Current Status Current Status   Functional Area: CareTool Score: Comments:   Upper Body Dressing 2 Pt able to doff shirt with mod [name] via over head method. Required assist to don shirt. Max [name] to manage sling   Lower Body Dressing 2 mod [name] to thread through BLE. Pt able to pull over L hip. Required assist to pull over R hip        Medications:   Medication reviewed by me:     Scheduled Meds: PRN Meds:   aspirin  EC, 325 mg, Oral, Daily  clonazePAM , 1 mg, Oral, Daily  docusate sodium , 100 mg, Oral, Daily  gabapentin , 300 mg, Oral, 4 times per day  heparin (porcine), 5,000 Units, Subcutaneous, Q8H SCH  lacosamide , 200 mg, Oral, BID  levETIRAcetam , 1,500 mg, Oral, Q12H  SCH  levothyroxine , 100 mcg, Oral, Daily at 0600  metoprolol  succinate XL, 25 mg, Oral, Daily  pantoprazole, 40 mg, Oral, QAM AC  polyethylene glycol, 17 g, Oral, Daily  rosuvastatin , 5 mg, Oral, QHS  triamterene -hydrochlorothiazide , 1 tablet, Oral, Daily  [START ON 03/08/2024] vitamin D  (ergocalciferol ), 50,000 Units, Oral, Weekly        Continuous Infusions:   lactulose, 10 g, Daily PRN  naloxone , 0.4 mg, PRN  ondansetron , 4 mg, Q8H PRN   Or  ondansetron , 4 mg, Q8H PRN  oxyCODONE , 10 mg, Q4H PRN  oxyCODONE , 5 mg, Q4H PRN  polyethylene glycol, 17 g, Daily PRN            Medication Review  [name] complete drug regimen review was completed: Yes  Were any drug issues found during review?: No       Review of Systems:   [name] comprehensive review of systems was: No fevers, chills, nausea, vomiting, chest pain, shortness of breath, cough, headache, double vision.  All others negative.    Physical Exam:     Vitals:    03/05/24 0848 03/05/24 1000 03/05/24 1026 03/05/24 1521   BP: 97/63 117/69 117/69 93/55   Pulse: 68 64 64 61   Resp:       Temp:  98.2 F (36.8 C)      TempSrc: Oral      SpO2: 98% 99% 99%    Weight:       Height:           Intake and Output Summary (Last 24 hours) at Date Time    Intake/Output Summary (Last 24 hours) at 03/05/2024 1521  Last data filed at 03/05/2024 1300  Gross per 24 hour   Intake 1260 ml   Output 1 ml   Net 1259 ml     P.O.: 240 mL (03/05/24 1300)     Urine: 0 mL (03/05/24 0900)  Bladder Scan Volume (mL): 247 mL (03/05/24 0615)  Intermittent/Straight Cath (mL): 500 mL (03/04/24 0600)     Alert and NAD  Cardiac: regular rate and rhythm  Chest / Lungs:  Clear to auscultation.  Abdomen:  + bowel sounds, Soft, non-tender, non-distended.  Extremities: no calf tenderness. No pitting edema of BLE.    Labs:   No results for input(s): GLUCOSEWHOLE in the last 24 hours.    Recent Labs   Lab 03/04/24  0525   WBC 6.97   Hemoglobin 10.5*   Hematocrit 32.3*   Platelet Count 352*        Recent Labs   Lab  03/04/24  0525   Sodium 138   Potassium 4.1   Chloride 102   CO2 27   BUN 15   Creatinine 0.7   Calcium  9.0   Albumin  2.9*   Protein, Total 5.7*   Bilirubin, Total 0.5   Alkaline Phosphatase 57   ALT 14   AST (SGOT) 16   Glucose 97                       Rads:   Radiological Procedure reviewed.  No results found.        Assessment and Plan:     68 y/o female with PMH of epilepsy, HLD, hypothyroidism, DVT, s/p IVD filter, migraines, SVT, iron deficiency anemia, knee and hip replacements, colon resection, lumbar laminectomy, adverse anesthesia effects,  s/p fall after accidental OD on her meds, she had recent R shoulder replacement on 02/26/24.     #Rehab  - PT, OT with ADL, mobility, and ambulation with AD  - TR  - Fall, seizures, NWB RUE, and sling  -We had [name] team conference today  led by me and concur with team findings and recommendations. Please refer to the note for details.      #Ataxia, accidental OD with meds  #s/p fall  -Monitor, esp with use of pain meds  -Therapy with gait training, stability  -Per neurology, ataxia attributed to medication OD     #H/o seizure d/o  -Poss seizures  -CT, MRI of the brain with no acute abn  -Continue with seizure meds: Keppra , Lacosamide ,   -On low dose of clonazepam   -Seizure precaution  -Neurology f/u     #S/p R shoulder replacement  -NWB RUE, on sling  -f/u with Dr. Barnabas  -shoulder x ray with postop changes, no displaced or fx  -Pt has f/u with orthopedic NP this Friday for postop f/u.  10/9: Per patient's husband, rescheduled for surgery follow-up with Dr. Brendon on 03/10/24 for postop follow-up.     #HLD  - On Crestor      #Hypothyroidism  -On synthroid      #H/o iron def anemia  -CBC in am  -Est baseline Hgb 12.0 g/dl  -H/H 89.4/67.6 today , interm  f/u. Postop anemia.  10/9: F/u CBC in am.     #H/o SVT  -On metoprolol , maxzide      #H/o migrane     #Obesity class II with BMI 39.44 kg/m2     #DVT, h/o  -s/p IVD filter  -heparin sq  -TEDS/ SCD     #Dispo  -Plan for d/c on  03/13/24 with HH f/u.       Problem List[1]    Continue comprehensive and intensive inpatient rehab program, including:   Physical therapy 60-120 min daily, 5-6 times per week, Occupational therapy 60-120 min daily, 5-6 times per week, TR,Case management and Rehabilitation nursing                  Acute blood loss anemia requiring monitoring     Highest Value Lowest Value Decrease   Hemoglobin 13.2 g/dl  0/75/7974  8:71 PM 89.4 g/dl  89/05/7972  4:74 AM -2.7 g/dl   Hematocrit 59%  0/75/7974  1:28 PM  32.3%  03/04/2024  5:25 AM -7.7%           Signed by: Ozell GORMAN Lawyer, MD MD    [facility]    If there are questions or concerns about the content of this note or information contained within the body of this dictation they should be addressed directly with the author for clarification.               [1]   Patient Active Problem List  Diagnosis    Primary osteoarthritis of left knee    Mass of right breast, unspecified quadrant    Ventricular tachyarrhythmia (CMS/HCC)    Hypertriglyceridemia    Hypothyroidism, unspecified type    DDD (degenerative disc disease), lumbosacral    Prediabetes    Class 3 severe obesity with serious comorbidity and body mass index (BMI) of 40.0 to 44.9 in adult, unspecified obesity type (CMS/HCC)    Seizure disorder (CMS/HCC)    SVT (supraventricular tachycardia)    History of colon resection    History of cholecystectomy    History of maternal deep vein thrombosis (DVT)    Morbid obesity with BMI of 45.0-49.9, adult (CMS/HCC)    Morbid obesity (CMS/HCC)    Seizures (CMS/HCC)

## 2024-03-05 NOTE — Progress Notes (Signed)
 Therapeutic Recreation  Inpatient Rehabilitation Progress Note    Patient Name:  Jennifer [name]       Medical Record Number: [id number]   Date of Birth: Jun 02, 1955  Sex: Female          Room/Bed:  M555/M555.01    Therapy Received:  Start Time: 11:00  Stop Time: 12:00  Total Therapy Minutes: 60    Rehabilitation Precautions/Restrictions:  Weight Bearing Status: RUE non weight bearing  Other Precautions: fall, seizure, sling at all times, cognitive impairments    Rehab Diagnosis: Seizures (CMS/HCC) [R56.9]     Subjective   Patient Report: I like to do those word searches.  Patient/Caregiver Goals: Stop falling.  Therapeutic Recreation  Intellectual Interests: Reading, Puzzles  Home Activities: Television  Activities: Relaxation therapy, Music therapy, Exercise, Arts/Crafts    Objective   Pt received in TR dayroom, fatigued but agreeable to session. Pt attempted engagement in jigsaw puzzle however demonstrated max difficulty organizing pieces by shape. Pt then redirected to word search activity, which she identified as a familiar activity. Pt engaged in task with max cues to locate hidden words.   Education Documentation  No documentation found.  Education Comments  No comments found.        Assessment & Plan   Pt limited by fatigue this hour, however demonstrated increased participation in familiar and personally relevant activity. Husband present and supportive.   Groups appropriate for patient:        Group Justification: Improve coping and therapeutic participation with support by peers  Practice communication/functional cognitive skills in a setting that mimics a real world environment through conversations with peers  Practice /carry over skills learned in individual sessions with new partners, clinicians, and context  Practice strategies to manage communication/cognitive challenges in a distracting environment  Experience modeling by peers  Carryover skills learned in individual sessions in group  format  Emotional and/or spiritual support from peers experiencing similar circumstances

## 2024-03-05 NOTE — PT Progress Note (Signed)
 Physical Therapy  Inpatient Rehabilitation Daily Progress Note    Patient Name:  Jennifer [name]       Medical Record Number: [id number]   Date of Birth: 09-Jul-1955  Sex: Female          Room/Bed:  M555/M555.01    Therapy Received:  Start Time: 1000   Stop Time: 1100  Total Therapy Minutes: 60    Rehabilitation Precautions/Restrictions:  Weight Bearing Status: RUE non weight bearing  Other Precautions: fall, seizure, sling at all times, cognitive impairments    Rehab Diagnosis: Seizures (CMS/HCC) [R56.9]     Subjective   Patient Report: Why do I do that (lean back) when I stand?  Patient/Caregiver Goals: Stop falling.  Pain Assessment:  Pain Assessment: No/denies pain (Pt pre-medicated for pain.)    Objective   Vitals:  Heart Rate: 64  BP: 117/69  BP Location: Left arm  MAP (mmHg): 85  Patient Position: Sitting  SpO2: 99 %    Interventions:   Pt sitting in WC, ready for therapy. Appreciate handoff from RN Sia who clears pt for therapy.     Husband entered at beginning of session and brining RUE shoulder exercises provided from surgeon; pt's primary PT notified.     Transfers  CGA to MIN-A to stand to [name].  Pt cued for hand placement, abiding ~70% of time for safety with transitions.   Pt mildly impulsive throughout session, does attempt to stand from chair to reposition with MIN-A to avoid anterior LOB.    Gait Training  Pt instructed in ambulation using HW with CGA to MIN-A with gait belt and WC follow for safety.  Pt completes 40', 20', and 15'.  Pt with intermittent LOB to the R and posteriorly.  Heavy cues for sequencing and safety with use of HW.  Pt also noted to have dec step length and height, of which husband reports pt does at baseline.    Pt's RUE sling adjusted throughout session, ortho tech Smantha assisting at end of session and determining R arm tray not needed.     Towards end of session pt becoming very tired, able to attend when cued and suspect lethargy 2' pain medicine kicking in.     Patient  Disposition: in chair, handoff to next therapist, other (comment) (in TR gym wiht [name]; husband present.)  Safety Interventions: personal items within reach, chair alarm acitvated, handoff to next therapist      Education Documentation  Safety issues and interventions, taught by Rollene Laneta Browning, LPTA at 03/05/2024 10:00 AM.  Learner: Significant Other, Patient  Readiness: Acceptance  Method: Explanation, Teach Back, Demonstration  Response: Verbalizes Understanding, Needs Reinforcement    Functional transfers/mobility, taught by Rollene Laneta Browning, LPTA at 03/05/2024 10:00 AM.  Learner: Significant Other, Patient  Readiness: Acceptance  Method: Explanation, Teach Back, Demonstration  Response: Verbalizes Understanding, Needs Reinforcement    Education Comments  No comments found.         Assessment & Plan   Jennifer [name] had ongoing difficulty attending to directions this session and overall poor safety awareness. She will benefit from ongoing balance training, tactics for alternative pain control techniques, and safety with mobilizing using the hemi walker. Continue plan of care as set by evaluating Physical Therapist.     Plan:  Risks/Benefits/POC Discussed with Pt/Family: With patient/family  Treatment/Interventions: Exercise, Gait training, Stair training, Functional transfer training, LE strengthening/ROM, Endurance training, Cognitive reorientation, Patient/family training, Compensatory technique education    Recommendation:  Discharge Recommendation: Home  with supervision, Home with home health PT  PT Therapy Recommendation: 5-6 days/week, 60-120 mins/day, 1:1 treatment, Group therapy  PT Estimated Length of Stay: 7-10 days from eval 10/8

## 2024-03-05 NOTE — Progress Notes (Signed)
 Case Management Progress Note:  LOA:    Ozell LULLA Foot, MD-Orthopedic Surgery   Appointment scheduled 03/10/2024 at 1:15 PM  [address]  [place], [place] [address]  Phone: [phone]    H&M [[phone]]    Date: 03/10/2024  Pick up ETA: 12:15  Return ETA: 2:30            Montie Levy, LMSW, ACMA-SW  Social Work Case Manager  [facility]   Phone: [phone]  Fax: [phone]  [email or web address]

## 2024-03-05 NOTE — Plan of Care (Signed)
 Problem: Pain Management  Goal: LTG: Patient will verbalize pain relief less than 2/10 with the use of pain medication and nonpharmacological pain measures such as deep breathing, relaxation techniques and repositioning 100% of the time  Outcome: Progressing     Problem: Skin Wound Management  Goal: LTG: Patient will show no evidence of skin breakdown during hospitalization.  Outcome: Progressing     Problem: Risk of Infection  Goal: LTG: Patient incisions and abrasions will be free of any signs or symptoms of infection and will advance to healed by at least 50% by discharge date.  Outcome: Progressing     Problem: Safety Risk  Goal: LTG: Patient will acknowledge limitations and call for assistance before transferring in and out of bed and with ambulation 100% of the tim  Outcome: Progressing       Problem: Pain Management  Goal: LTG: Patient will verbalize pain relief less than 2/10 with the use of pain medication and nonpharmacological pain measures such as deep breathing, relaxation techniques and repositioning 100% of the time  Outcome: Progressing     Patient on seizures and fall precautions in place. Shoulder sling on at all times. Pt is continent of B/B, assisted to the bathroom using W/C. Pt verbalized severe pain in Rt shoulder relieved by Oxycodone  10mg , No s/s of infection, safety maintained.

## 2024-03-05 NOTE — OT Progress Note (Addendum)
 Occupational Therapy  Inpatient Rehabilitation Daily Progress Note    Patient Name:  Jennifer [name]       Medical Record Number: [id number]   Date of Birth: 17-May-1956  Sex: Female          Room/Bed:  M555/M555.01    Therapy Received:  Start Time: 0830   Stop Time: 0900  Total Therapy Minutes: 30    Rehabilitation Precautions/Restrictions:  Weight Bearing Status: RUE non weight bearing  Other Precautions: fall, seizure, sling at all times, cognitive impairments    Rehab Diagnosis: Seizures (CMS/HCC) [R56.9]     Subjective   Patient Report: I'm sorry I messed up people's schedule today    Patient/Caregiver Goals: Stop falling.  Pain Assessment:      Objective   Vitals:  Heart Rate: 68  BP: 97/63  MAP (mmHg): 74  SpO2: 98 %    Interventions:     Pt received supine in bed. Pt is agreeable to participation in 30 minutes of missed therapy session. Handoff with RN appreciated prior to session, reporting no concerns with participation in session.    Pt completed supine > sit with SPV and use of bed rails. Noted BP to drop upon sitting- therefore, pt completed dressing at EOB for safety. Pt is asymptomatic. Please see CARETool scores below for more details:     Noted pt to be retropulsive during static sitting requiring OT to instruct pt to lean forward. Pt also with multiple posterior LOB while standing- resulting in pt to sit back down at EOB.     Pt completed sit > supine with SPV.     Patient Disposition: in room, in bed  Safety Interventions: handoff to nurse, bed alarm activated, oriented to call bell and placed within reach, personal items within reach, assistive devices out of reach, bed placed in lowest position      Self Care Functional Status:    Current Status Current Status   Functional Area: CareTool Score: Comments:   Upper Body Dressing 2 Pt able to doff shirt with mod A via over head method. Required assist to don shirt. Max A to manage sling   Lower Body Dressing 2 mod A to thread through BLE. Pt able  to pull over L hip. Required assist to pull over R hip      Education Documentation  Precautions, taught by Norvel Norris, OT at 03/05/2024  9:27 AM.  Learner: Patient  Readiness: Acceptance  Method: Explanation, Demonstration  Response: [place]rell Understanding, Demonstrated Understanding, Needs Reinforcement    Plan of care, taught by Norvel Norris, OT at 03/05/2024  9:27 AM.  Learner: Patient  Readiness: Acceptance  Method: Explanation, Demonstration  Response: Verbalizes Understanding, Demonstrated Understanding, Needs Reinforcement    Functional transfers/mobility, taught by Norvel Norris, OT at 03/05/2024  9:27 AM.  Learner: Patient  Readiness: Acceptance  Method: Explanation, Demonstration  Response: Verbalizes Understanding, Demonstrated Understanding, Needs Reinforcement    Fall prevention/balance training, taught by Norvel Norris, OT at 03/05/2024  9:27 AM.  Learner: Patient  Readiness: Acceptance  Method: Explanation, Demonstration  Response: Verbalizes Understanding, Demonstrated Understanding, Needs Reinforcement    ADL retraining, taught by Norvel Norris, OT at 03/05/2024  9:27 AM.  Learner: Patient  Readiness: Acceptance  Method: Explanation, Demonstration  Response: Verbalizes Understanding, Demonstrated Understanding, Needs Reinforcement    Education Comments  No comments found.        Assessment & Plan   Pt required mod-max A for UB and LB dressing d/t orthopedic precaution of  RUE. She also demonstrates multiple retropulsive tendencies during static sitting and standing. Pt would benefit from continued interventions addressing ADL retraining, compensatory strategies, dynamic & static balance, weakness, and endurance. Continue OT POC.    Plan:  Risks/Benefits/POC Discussed with Pt/Family: With patient  Treatment Interventions: ADL retraining, Functional transfer training, Endurance training, Patient/Family training, Equipment eval/education, Compensatory technique  education    Recommendation:  Discharge Recommendation: Home with home health OT  OT Estimated Length of Stay: 7-10 days
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
## 2024-03-05 NOTE — PT Progress Note (Signed)
 Physical Therapy  Inpatient Rehabilitation Daily Progress Note    Patient Name:  Jennifer [name]       Medical Record Number: [id number]   Date of Birth: 1955/05/30  Sex: Female          Room/Bed:  M555/M555.01    Therapy Received:  Start Time: 1500   Stop Time: 1540  Total Therapy Minutes: 40     Pt is 15/7: No  Missed Therapy Reason: Medical complications (Pt unable to maintain arosal to appropriatly particiapte in therapy)  Plan to make up missed minutes: Later today , not scheduled    Rehabilitation Precautions/Restrictions:  Weight Bearing Status: RUE non weight bearing  Other Precautions: fall, seizure, sling at all times, cognitive impairments    Rehab Diagnosis: Seizures (CMS/HCC) [R56.9]     Subjective   Patient Report: Did I fall asleep again?  Patient/Caregiver Goals: Stop falling.  Pain Assessment:  Pain Assessment: No/denies pain    Objective   Vitals:  Heart Rate: 61  BP: 93/55  BP Location: Left arm  BP Method: Automatic  MAP (mmHg): 67  Patient Position: Sitting      Interventions:     Handoff from RN who reports pt is appropriate for therapy. Pt received supine in bed. Agreeable to therapy upon awakening. Supine to sit with max A. Stand pivot transfer to w/c with min A and max VC for not using RUE. Dependently taken to 1st floor gym. Stand pivot transfer to new wider w/c at hemiheight to fit pt's body better with min A. Pt often closing eyes and falling asleep - BP 93/55, so dependently returned to room. Stand pivot transfer to bed with min A. Max A at LE to bring into bed and max VC for scooting laterally in bed.     Pt performed PROM to following joints per surgeon's HEP after shoulder surgery. 2x10 ea: shoulder ER to 30 deg, flexion to 45 deg, elbow F/E full range, wrist F/W full range. Pt continued to fall asleep - left in bed and informed RN.     Education Documentation  Safety issues and interventions, taught by Fernando Quarry, PT at 03/05/2024  3:00 PM.  Learner: Patient  Readiness:  Acceptance  Method: Explanation, Demonstration  Response: Needs Reinforcement    Rehab techniques/procedure, taught by Fernando Quarry, PT at 03/05/2024  3:00 PM.  Learner: Patient  Readiness: Acceptance  Method: Explanation, Demonstration  Response: Needs Reinforcement    Orthotics/prosthetics, taught by Fernando Quarry, PT at 03/05/2024  3:00 PM.  Learner: Patient  Readiness: Acceptance  Method: Explanation, Demonstration  Response: Needs Reinforcement    Home management activities, taught by Fernando Quarry, PT at 03/05/2024  3:00 PM.  Learner: Patient  Readiness: Acceptance  Method: Explanation, Demonstration  Response: Needs Reinforcement    Equipment, taught by Fernando Quarry, PT at 03/05/2024  3:00 PM.  Learner: Patient  Readiness: Acceptance  Method: Explanation, Demonstration  Response: Needs Reinforcement    Education Comments  No comments found.         Assessment & Plan     Pt limited in session today  by low arousal, possibly due to pain meds taken prior to session. Decreased arousal requiring increased cuing for sequencing of all tasks.     Plan:  Risks/Benefits/POC Discussed with Pt/Family: With patient/family  Treatment/Interventions: Exercise, Gait training, Stair training, Functional transfer training, LE strengthening/ROM, Endurance training, Cognitive reorientation, Patient/family training, Compensatory technique education    Recommendation:  Discharge Recommendation: Home with supervision, Home with home  health PT  PT Therapy Recommendation: 5-6 days/week, 60-120 mins/day, 1:1 treatment, Group therapy  PT Estimated Length of Stay: 7-10 days from eval 10/8

## 2024-03-05 NOTE — Plan of Care (Signed)
 Problem: Pain Management  Goal: LTG: Patient will verbalize pain relief less than 2/10 with the use of pain medication and nonpharmacological pain measures such as deep breathing, relaxation techniques and repositioning 100% of the time  Outcome: Progressing:  Patient tolerated her pain medication well and its effective. No concerns at this time.     Problem: Skin Wound Management  Goal: LTG: Patient will show no evidence of skin breakdown during hospitalization.  Outcome: Progressing: Surgical site to right shoulder remains with dressing in place. No signs of infection noted.     Problem: Safety Risk  Goal: LTG: Patient will acknowledge limitations and call for assistance before transferring in and out of bed and with ambulation 100% of the tim  Outcome: Progressing: Patient remains safe in her room with call light and frequently used items placed within her reach.

## 2024-03-06 LAB — LAB USE ONLY - CBC WITH DIFFERENTIAL
Absolute Basophils: 0.03 x10 3/uL (ref 0.00–0.08)
Absolute Eosinophils: 0.39 x10 3/uL (ref 0.00–0.44)
Absolute Immature Granulocytes: 0.04 x10 3/uL (ref 0.00–0.07)
Absolute Lymphocytes: 2.04 x10 3/uL (ref 0.42–3.22)
Absolute Monocytes: 0.43 x10 3/uL (ref 0.21–0.85)
Absolute Neutrophils: 3.86 x10 3/uL (ref 1.10–6.33)
Absolute nRBC: 0 x10 3/uL (ref <=0.00)
Basophils %: 0.4 %
Eosinophils %: 5.7 %
Hematocrit: 31.3 % — ABNORMAL LOW (ref 34.7–43.7)
Hemoglobin: 10.2 g/dL — ABNORMAL LOW (ref 11.4–14.8)
Immature Granulocytes %: 0.6 %
Lymphocytes %: 30 %
MCH: 31.5 pg (ref 25.1–33.5)
MCHC: 32.6 g/dL (ref 31.5–35.8)
MCV: 96.6 fL — ABNORMAL HIGH (ref 78.0–96.0)
MPV: 10.5 fL (ref 8.9–12.5)
Monocytes %: 6.3 %
Neutrophils %: 57 %
Platelet Count: 357 x10 3/uL — ABNORMAL HIGH (ref 142–346)
Preliminary Absolute Neutrophil Count: 3.86 x10 3/uL (ref 1.10–6.33)
RBC: 3.24 x10 6/uL — ABNORMAL LOW (ref 3.90–5.10)
RDW: 13 % (ref 11–15)
WBC: 6.79 x10 3/uL (ref 3.10–9.50)
nRBC %: 0 /100{WBCs} (ref <=0.0)

## 2024-03-06 MED ORDER — GABAPENTIN 100 MG PO CAPS
200.0000 mg | ORAL_CAPSULE | Freq: Four times a day (QID) | ORAL | Status: DC
Start: 2024-03-06 — End: 2024-03-06
  Administered 2024-03-06: 200 mg via ORAL
  Filled 2024-03-06: qty 2

## 2024-03-06 MED ORDER — GABAPENTIN 100 MG PO CAPS
200.0000 mg | ORAL_CAPSULE | Freq: Three times a day (TID) | ORAL | Status: DC
Start: 2024-03-06 — End: 2024-03-12
  Administered 2024-03-06 – 2024-03-12 (×16): 200 mg via ORAL
  Filled 2024-03-06 (×16): qty 2

## 2024-03-06 NOTE — Progress Notes (Signed)
 IRF PPS Data Collection Rights and Rehab Privacy Act Notice: Patient and family/caregiver received a copy of the IRF-PAI Data Collection Rights and Rehab Privacy Act Notice.

## 2024-03-06 NOTE — PT Progress Note (Signed)
 Physical Therapy  Inpatient Rehabilitation Daily Progress Note    Patient Name:  Jennifer [name]       Medical Record Number: [id number]   Date of Birth: [birth year]  Sex: Female          Room/Bed:  M555/M555.01    Therapy Received:  Start Time: 1100   Stop Time: 1200  Total Therapy Minutes: 60    Rehabilitation Precautions/Restrictions:  Weight Bearing Status: RUE non weight bearing  Other Precautions: fall, seizure, sling at all times, cognitive impairments    Rehab Diagnosis: Seizures (CMS/HCC) [R56.9]     Subjective   Patient Report: Jennifer [name] reports she just finished getting a shower.   Patient/Caregiver Goals: Stop falling.  Pain Assessment:  Pain Assessment: No/denies pain (Jennifer [name] does not appear in pain; did not ask.)    Objective     Interventions:   Jennifer [name] sitting in WC, ready for therapy. Appreciate handoff from RN Sia who clears Jennifer [name] for therapy.   OT Kaitlyn just finishing with Jennifer [name], reports no issues.  Jennifer [name] brought to [facility] gym.    Transfers  CGA SqPT WC<>NuStep leading to L side with LUE reaching to arm rest.  CGA for safety via gait belt STS to [facility] during session.    Therapeutic Exercise  Jennifer [name] completed 10 minutes on NuStep BUE/BLE level 0, for improved cardiovascular efficiency and improved range of motion. Avg SPM 27 and .15 miles total. Jennifer [name] required VCS to attend during session 2' ongoing lethargy.     Jennifer [name] provided with seated AROM BLE exercises to complete Sunday (when Jennifer [name] has no therapy), for the following:   Access Code: [email or web address]  URL: [email or web address]  Date: 03/06/2024  Prepared by: Laneta [place]    Exercises  - Seated Kick Outs  - 1 x daily - 7 x weekly - 3 sets - 10 reps  - Seated March  - 1 x daily - 7 x weekly - 3 sets - 10 reps  - Seated Heel Toe Raises  - 1 x daily - 7 x weekly - 3 sets - 10 reps  - Seated Heel Slide  - 1 x daily - 7 x weekly - 3 sets - 10 reps  - Seated Hip Abduction  - 1 x daily - 7 x weekly - 3 sets - 10 reps  - Seated Gluteal Sets  - 1 x daily - 7 x weekly - 3 sets - 10 reps - 5  hold  - Seated Hip Adduction Isometrics with Ball  - 1 x daily - 7 x weekly - 3 sets - 10 reps    Gait Training  Jennifer [name] instructed in ambulation using HW with MIN-A via gait belt. WC follow provided for safety 2' dec balance and dec safety awareness.  Jennifer [name] completes 25'.  Jennifer [name] cued for sequencing and heavy cues to inc step length/height with moderate reception to decrease risk for falls and normalize gait pattern.     Patient Disposition: in room, in chair  Safety Interventions: handoff to nurse, chair alarm acitvated, oriented to call bell and placed within reach, personal items within reach, assistive devices out of reach    Education Documentation  Fall prevention/balance training, taught by [place] Laneta Browning, LPTA at 03/06/2024 11:00 AM.  Learner: Patient  Readiness: Acceptance  Method: Explanation, Teach Back  Response: Verbalizes Understanding    Education Comments  No comments found.         Assessment & Plan   Jennifer [name] had improved transfers this date, though  continues to be lethargic at times which can impact her therapy sessions. Continue to focus on balance and endurance. Continue plan of care as set by evaluating Physical Therapist.     Plan:  Risks/Benefits/POC Discussed with Jennifer [name]/Family: With patient/family  Treatment/Interventions: Exercise, Gait training, Stair training, Functional transfer training, LE strengthening/ROM, Endurance training, Cognitive reorientation, Patient/family training, Compensatory technique education    Recommendation:  Discharge Recommendation: Home with supervision, Home with home health Jennifer [name]  Jennifer [name] Therapy Recommendation: 5-6 days/week, 60-120 mins/day, 1:1 treatment, Group therapy  Jennifer [name] Estimated Length of Stay: 7-10 days from eval 10/8
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
## 2024-03-06 NOTE — OT Progress Note (Signed)
 Occupational Therapy  Inpatient Rehabilitation Daily Progress Note    Patient Name:  Jennifer [name]       Medical Record Number: [id number]   Date of Birth: 1956/01/19  Sex: Female          Room/Bed:  M555/M555.01    Therapy Received:  Start Time: 1000   Stop Time: 1100  Total Therapy Minutes: 60    Rehabilitation Precautions/Restrictions:  Weight Bearing Status: RUE non weight bearing  Other Precautions: fall, seizure, sling at all times, cognitive impairments    Rehab Diagnosis: Seizures (CMS/HCC) [R56.9]     Subjective   Patient Report: My hair is a mess  Patient/Caregiver Goals: Stop falling.  Pain Assessment:      Objective   Interventions: Handoff completed with RN, Pt cleared for therapy. Pt met in chair, agreeable to therapy.     Pt dep taken to bathroom for time efficiency and energy conservation. Session focused on morning ADL routine to promote ind and safety. OT assisted Pt c washing hair. Pt is going to see surgeon next week and will clarify orders about brace AAT. PT required mod A to doff brace and completed the following HEP exercises.     Pendulums   Bicep flexion/extension   Wrist flexion/ extension   Shoulder rolls and shrugs    Pt able to don shirt c modA to thread up arms and don over head c modA to don brace. Completed grooming tasks at sink c set up assist. Able to use R hand to stabilize objects while manipulating c L hand. Transtioend to [place] minA c cues to maintain NWB precautions. Required modA to doff pants in standing c use of GB to steady. Completing tolieting (+BM) c assist for pericare in sitting with anterior lean. Therapist assisted with threading pants for time efficiency and assisted in donning over hips in standing c Pt using GB to steady. Transition back to w/c same as above. Direct handoff off to PTA Kennan.        Education Documentation  Rehab techniques/procedure, taught by Georgina Krist, OT at 03/06/2024 12:29 PM.  Learner: Patient  Readiness: Acceptance  Method:  Explanation, Teach Back  Response: Verbalizes Understanding, Needs Reinforcement, Demonstrated Understanding    Precautions, taught by Yeshua Stryker, OT at 03/06/2024 12:29 PM.  Learner: Patient  Readiness: Acceptance  Method: Explanation, Teach Back  Response: Verbalizes Understanding, Needs Reinforcement, Demonstrated Understanding    Plan of care, taught by Wendal Wilkie, OT at 03/06/2024 12:29 PM.  Learner: Patient  Readiness: Acceptance  Method: Explanation, Teach Back  Response: Verbalizes Understanding, Needs Reinforcement, Demonstrated Understanding    Fall prevention/balance training, taught by Ainsley Sanguinetti, OT at 03/06/2024 12:29 PM.  Learner: Patient  Readiness: Acceptance  Method: Explanation, Teach Back  Response: Verbalizes Understanding, Needs Reinforcement, Demonstrated Understanding    ADL retraining, taught by Eber Ferrufino, OT at 03/06/2024 12:29 PM.  Learner: Patient  Readiness: Acceptance  Method: Explanation, Teach Back  Response: Verbalizes Understanding, Needs Reinforcement, Demonstrated Understanding    Education Comments  No comments found.        Assessment & Plan   Pt tolerate 60 min session well this date. Pt continues to be limited by decreased endurance, strength, and NWB precautions . Pt would benefit from continued skilled intervention to address mentioned deficits to and ensure safe d/c. Continue with POC.      Plan:  Risks/Benefits/POC Discussed with Pt/Family: With patient  Treatment Interventions: ADL retraining, Functional transfer training, Endurance training, Patient/Family training,  Equipment eval/education, Compensatory technique education    Recommendation:  Discharge Recommendation: Home with home health OT  OT Estimated Length of Stay: 7-10 days

## 2024-03-06 NOTE — Plan of Care (Signed)
 Problem: Pain Management  Goal: LTG: Patient will verbalize pain relief less than 2/10 with the use of pain medication and nonpharmacological pain measures such as deep breathing, relaxation techniques and repositioning 100% of the time  Outcome: Progressing:  Patient continues to tolerates her pain medications well.  Blood pressure however is noted low and patient noted to be fall asleep easy. Patient agrees falling asleep and agrees for reduction of some of her scheduled pain medication especially Gabapentin .  PRN oxy was also not administer this morning due to low blood pressure.  Patient however insisted on taking her metoprolol  despite the low BP and not too high heart rate. Will continue to monitor.     Problem: Safety Risk  Goal: LTG: Patient will acknowledge limitations and call for assistance before transferring in and out of bed and with ambulation 100% of the tim  Outcome: Progressing: Patient remains safe in her room with call light and frequently used items placed within her reach.

## 2024-03-06 NOTE — Progress Notes (Signed)
 PHYSICAL MEDICINE AND REHABILITATION  PROGRESS NOTE -- FACE-TO-FACE ENCOUNTER    Date Time: 03/06/24 1:52 PM  Patient Name: Jennifer [name], Jennifer [name]    Admission date:  03/03/2024  (LOS: 3 days)    Subjective:     Patient noticed stable when walking in therapy with hemiwalker.  Patient with no complaint chest pain or abdominal pain.  Patient with no complaints of headache, denies nausea or vomiting.  Patient slept well last night.  Patient with no BM, denies dysuria.    Functional Status:     OT assisted Pt c washing hair. Pt is going to see surgeon next week and will clarify orders about brace AAT. PT required mod [name] to doff brace and completed the following HEP exercises.      Pendulums   Bicep flexion/extension   Wrist flexion/ extension   Shoulder rolls and shrugs     Pt able to don shirt c modA to thread up arms and don over head c modA to don brace. Completed grooming tasks at sink c set up assist. Able to use R hand to stabilize objects while manipulating c L hand. Transtioend to [facility] minA c cues to maintain NWB precautions. Required modA to doff pants in standing c use of GB to steady. Completing tolieting (+BM) c assist for pericare in sitting with anterior lean. Therapist assisted with threading pants for time efficiency and assisted in donning over hips in standing c Pt using GB to steady.     Medications:   Medication reviewed by me:     Scheduled Meds: PRN Meds:   aspirin  EC, 325 mg, Oral, Daily  clonazePAM , 1 mg, Oral, Daily  docusate sodium , 100 mg, Oral, Daily  gabapentin , 200 mg, Oral, 4 times per day  heparin (porcine), 5,000 Units, Subcutaneous, Q8H SCH  lacosamide , 200 mg, Oral, BID  levETIRAcetam , 1,500 mg, Oral, Q12H SCH  levothyroxine , 100 mcg, Oral, Daily at 0600  metoprolol  succinate XL, 25 mg, Oral, Daily  pantoprazole, 40 mg, Oral, QAM AC  polyethylene glycol, 17 g, Oral, Daily  rosuvastatin , 5 mg, Oral, QHS  triamterene -hydrochlorothiazide , 1 tablet, Oral, Daily  [START ON 03/08/2024] vitamin D   (ergocalciferol ), 50,000 Units, Oral, Weekly        Continuous Infusions:   lactulose, 10 g, Daily PRN  naloxone , 0.4 mg, PRN  ondansetron , 4 mg, Q8H PRN   Or  ondansetron , 4 mg, Q8H PRN  oxyCODONE , 10 mg, Q4H PRN  oxyCODONE , 5 mg, Q4H PRN  polyethylene glycol, 17 g, Daily PRN            Medication Review  [name] complete drug regimen review was completed: Yes  Were any drug issues found during review?: No       Review of Systems:   [name] comprehensive review of systems was: No fevers, chills, nausea, vomiting, chest pain, shortness of breath, cough, headache, double vision.  All others negative.    Physical Exam:     Vitals:    03/06/24 0530 03/06/24 0804 03/06/24 1245 03/06/24 1326   BP: 123/64 95/53 105/65 93/54   Pulse: 65 65 61 68   Resp: 17      Temp: 97.7 F (36.5 C) 97.3 F (36.3 C)     TempSrc: Oral Oral     SpO2: 100% 100%     Weight:       Height:           Intake and Output Summary (Last 24 hours) at Date Time    Intake/Output  Summary (Last 24 hours) at 03/06/2024 1352  Last data filed at 03/06/2024 0900  Gross per 24 hour   Intake 960 ml   Output 2 ml   Net 958 ml     P.O.: 240 mL (03/06/24 0900)     Urine: 0 mL (03/06/24 0900)  Bladder Scan Volume (mL): 247 mL (03/05/24 0615)  Intermittent/Straight Cath (mL): 500 mL (03/04/24 0600)     Alert and NAD  Cardiac: regular rate and rhythm  Chest / Lungs:  Clear to auscultation.  Abdomen:  + bowel sounds, Soft, non-tender, non-distended.  Extremities: no calf tenderness. No pitting edema of BLE.    Labs:   No results for input(s): GLUCOSEWHOLE in the last 24 hours.    Recent Labs   Lab 03/06/24  0610 03/04/24  0525   WBC 6.79 6.97   Hemoglobin 10.2* 10.5*   Hematocrit 31.3* 32.3*   Platelet Count 357* 352*        Recent Labs   Lab 03/04/24  0525   Sodium 138   Potassium 4.1   Chloride 102   CO2 27   BUN 15   Creatinine 0.7   Calcium  9.0   Albumin  2.9*   Protein, Total 5.7*   Bilirubin, Total 0.5   Alkaline Phosphatase 57   ALT 14   AST (SGOT) 16   Glucose 97              Results for orders placed or performed during the hospital encounter of 03/03/24 (from the past 24 hours)   CBC with Differential (Component)    Collection Time: 03/06/24  6:10 AM   Result Value    WBC 6.79    Hemoglobin 10.2 (L)    Hematocrit 31.3 (L)    Platelet Count 357 (H)    MPV 10.5    RBC 3.24 (L)    MCV 96.6 (H)    MCH 31.5    MCHC 32.6    RDW 13    nRBC % 0.0    Absolute nRBC 0.00    Preliminary Absolute Neutrophil Count 3.86    Neutrophils % 57.0    Lymphocytes % 30.0    Monocytes % 6.3    Eosinophils % 5.7    Basophils % 0.4    Immature Granulocytes % 0.6    Absolute Neutrophils 3.86    Absolute Lymphocytes 2.04    Absolute Monocytes 0.43    Absolute Eosinophils 0.39    Absolute Basophils 0.03    Absolute Immature Granulocytes 0.04            Rads:   Radiological Procedure reviewed.  No results found.        Assessment and Plan:     68 y/o female with PMH of epilepsy, HLD, hypothyroidism, DVT, s/p IVD filter, migraines, SVT, iron deficiency anemia, knee and hip replacements, colon resection, lumbar laminectomy, adverse anesthesia effects,  s/p fall after accidental OD on her meds, she had recent R shoulder replacement on 02/26/24.     #Rehab  - PT, OT with ADL, mobility, and ambulation with AD  - TR  - Fall, seizures, NWB RUE, and sling  -We had [name] team conference today  led by me and concur with team findings and recommendations. Please refer to the note for details.   10/10: IPOC completed today .     #Ataxia, accidental OD with meds  #s/p fall  -Monitor, esp with use of pain meds  -Therapy with gait training, stability  -Per  neurology, ataxia attributed to medication OD     #H/o seizure d/o  -Poss seizures  -CT, MRI of the brain with no acute abn  -Continue with seizure meds: Keppra , Lacosamide ,   -On low dose of clonazepam   -Seizure precaution  -Neurology f/u  10/10: Per medicine, decrease gabapentin , after discussing with patient's husband who is [name] pharmacist, request reducing dosage,  neurology consulted per medicine.     #S/p R shoulder replacement  -NWB RUE, on sling  -f/u with Dr. Barnabas  -shoulder x ray with postop changes, no displaced or fx  -Pt has f/u with orthopedic NP this Friday for postop f/u.  10/9: Per patient's husband, rescheduled for surgery follow-up with Dr. Brendon on 03/10/24 for postop follow-up.     #HLD  - On Crestor      #Hypothyroidism  -On synthroid      #H/o iron def anemia  -CBC in am  -Est baseline Hgb 12.0 g/dl  -H/H 89.4/67.6 today , interm f/u. Postop anemia.  10/9: F/u CBC in am.  10/10: H/H 10.2/31.3 today , f/u CBC Monday.     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       Problem List[1]    Continue comprehensive and intensive inpatient rehab program, including:   Physical therapy 60-120 min daily, 5-6 times per week, Occupational therapy 60-120 min daily, 5-6 times per week, TR,Case management and Rehabilitation nursing                   Acute blood loss anemia requiring monitoring     Highest Value Lowest Value Decrease   Hemoglobin 13.2 g/dl  0/75/7974  8:71 PM 89.7 g/dl  89/89/7974  3:89 AM -3 g/dl   Hematocrit 59%  0/75/7974  1:28 PM  31.3%  03/06/2024  6:10 AM -8.7%           Signed by: Ozell GORMAN Lawyer, MD MD    [facility]    If there are questions or concerns about the content of this note or information contained within the body of this dictation they should be addressed directly with the author for clarification.                 [1]   Patient Active Problem List  Diagnosis    Primary osteoarthritis of left knee    Mass of right breast, unspecified quadrant    Ventricular tachyarrhythmia (CMS/HCC)    Hypertriglyceridemia    Hypothyroidism, unspecified type    DDD (degenerative disc disease), lumbosacral    Prediabetes    Class 3 severe obesity with serious  comorbidity and body mass index (BMI) of 40.0 to 44.9 in adult, unspecified obesity type (CMS/HCC)    Seizure disorder (CMS/HCC)    SVT (supraventricular tachycardia)    History of colon resection    History of cholecystectomy    History of maternal deep vein thrombosis (DVT)    Morbid obesity with BMI of 45.0-49.9, adult (CMS/HCC)    Morbid obesity (CMS/HCC)    Seizures (CMS/HCC)

## 2024-03-06 NOTE — Consults (Signed)
 Psychology Services  Inpatient Rehabilitation Consultation    Patient Name:  Jennifer [name]       Medical Record Number: [id number]   Date of Birth: May 07, 1956  Sex: Female          Room/Bed:  M555/M555.01    Rehabilitation Precautions/Restrictions:  Precautions  Weight Bearing Status: RUE non weight bearing  Other Precautions: fall, seizure, sling at all times, cognitive impairments       Rehab Diagnosis: Seizures (CMS/HCC) [R56.9]      Past Medical History:   Medical History[1]      Behavioral Observations and Mental Status:  The patient was seen for a psychological session in the patient's room for adjustment to medical situation and rehabilitation hospitalization in accordance with the attending physician's initial plan of care. The patient has consented to psychology/neuropsychology services. The patient was found in her room. She was alert and cooperative. Jennifer [name] was oriented to person, place, time, date and situation. Affect was WNL, and she made appropriate eye contact. She was engaging throughout the session and demonstrated spontaneous speech. Personal grooming and hygiene were good. Speech was of normal rhythm, volume, rate, and prosody.  Processing speed was within normal limits. Behavioral signs of pain were not observed. There was no evidence of her responding to internal stimuli, and there was no delusional content in her speech. At the time of the session, the patient did not appear to be in or report any acute distress. Thought process was linear and goal oriented; content was appropriate. Patient did not endorse any suicidal ideation, intent, and/or plan.    O-log: 30 / 30 - fully Oriented    C-SSRS: Patient did not endorse any suicidal ideation, intent, and/or plan.        Assessment  Relevant background information was gathered.     Current mental health: The patient reported adjustment related challenges to pain management. She also endorsed significant stress from interpersonal factors  and the affect of those dynamics on her treatment. The patient was given space to express frustrations and concerns. The patient did not endorse other symptoms of psychopathology.     Mental health history: Patient did not endorse a history of mental health conditions or intervention; however, she reported taking clonazepam  for sleep due to side effects from Epilepsy medication.    PHQ-9: 2/25, not clinically significant for depression.    HSQ-10 [facility] Stress Questionnaire): 9/100 - mild stress related to dependency    The patient was provided with supportive counseling from a person centered and cognitive behavioral perspective. This clinician explored and reflected the patient's emotional process with adjustment. Support and encouragement were provided. Goals and personal values were explored, and coping strategies were integrated. Patient's hospital course and therapeutic process were discussed at length. The patient is in agreement with the plan of care and rehabilitation process.      Plan  Facilitate adaptive coping and adjustment to medical symptoms and related stressors.    Recommendations: Individual and co-treat as needed         [1]   Past Medical History:  Diagnosis Date    Abnormal vision     glasses    Arrhythmia 2018    SVT (had ablation)    Arthritis     Complication of anesthesia     urinary retention with every joint replacement, BP drop with spinal X1, another time spinal didn't work    Convulsions (CMS/HCC)     epilepsy, last seizure 2010  Deep venous thrombosis of distal lower extremity (CMS/HCC) 2018    after cholecystectomy; left leg    Diverticulitis     takes miralax     Encounter for blood transfusion 2018    Gastric ulceration 2018    GI bleed with requiring 4 units PRBCs per pt    Hyperlipidemia     Hypothyroidism     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

## 2024-03-06 NOTE — PT Progress Note (Signed)
 Physical Therapy  Inpatient Rehabilitation Daily Progress Note    Patient Name:  Jennifer [name]       Medical Record Number: [id number]   Date of Birth: 03-Oct-1955  Sex: Female          Room/Bed:  M555/M555.01    Therapy Received:  Start Time: 1300   Stop Time: 1400  Total Therapy Minutes: 60    Rehabilitation Precautions/Restrictions:  Weight Bearing Status: RUE non weight bearing  Other Precautions: fall, seizure, sling at all times, cognitive impairments    Rehab Diagnosis: Seizures (CMS/HCC) [R56.9]     Subjective   Patient Report: If I knew it would be like this, then I never would have done the surgery.  Patient/Caregiver Goals: Stop falling.  Pain Assessment:  Pain Assessment: Numeric Scale (0-10)  Pain Score: 8-severe pain  Pain Location: Shoulder  Pain Orientation: Right  Pain Intervention(s): Medication (See eMAR), Repositioned, Rest    Objective   Vitals:  Heart Rate: 68  BP: 93/54  BP Location: Left arm  MAP (mmHg): 67  Patient Position: Sitting      Interventions:   Handoff completed with RN, pt cleared for participation in therapy. Care Tech present and reports just returning from taking pt to the bathroom. Pt sitting up in W/C at bedside upon arrival, agreeable to treatment. Pt reports need to use the toilet again for BM. States I took too many laxatives this morning.  Pt was Dep wheeled into the bathroom in the W/C.  Pt stood with Supv and transferred with grab bar and CGA to the toilet. Pt could manage L sided pants and underwear, but required assist for R side due to R UE in post-surgical sling. Pt was able to urinate only.   Pt performed pericare with Set-up and CGA for balance in standing.   Pt performed hand hygiene seated at the sink with Set-up assist.  Pt was Dep brought to [facility] therapy gym in the W/C for efficiency.   BP checked and found as above (lower).   Pt transferred to the Nu-Step without AD with CGA.  R UE sling adjusted for improved comfort and alignment.   Pt performed Nu-Step  to increase cardiovascular return for BP management and for endurance/activity tolerance training at L2 decreasing to L0 x 10 min (5 min with B LE's and L UE and 5 min with LE's only), traveling a distance of 0.14 miles. Frequent cues required to complete task due to lethargy.   Pt transferred back to the W/C without AD with Close Supv.   Pt reports need to use bathroom again for BM.  Pt was Dep returned to her room and bathroom in the W/C. Pt transferred with CGA and grab bar. Assist provided to manage R sided pants/underwear.   Pt was handed off to RN and RN student at end of session due to need to sit longer to complete BM.       Education Documentation  Safety issues and interventions, taught by Lucillie Fitch, PT at 03/06/2024  2:38 PM.  Learner: Patient  Readiness: Acceptance  Method: Explanation, Teach Back, Demonstration  Response: Verbalizes Understanding, Demonstrated Understanding, Able to Teach Back, Needs Reinforcement    Rehab techniques/procedure, taught by Lucillie Fitch, PT at 03/06/2024  2:38 PM.  Learner: Patient  Readiness: Acceptance  Method: Explanation, Teach Back, Demonstration  Response: Verbalizes Understanding, Demonstrated Understanding, Able to Teach Back, Needs Reinforcement    Equipment, taught by Lucillie Fitch, PT at 03/06/2024  2:38 PM.  Learner: Patient  Readiness: Acceptance  Method: Explanation, Teach Back, Demonstration  Response: Verbalizes Understanding, Demonstrated Understanding, Able to Teach Back, Needs Reinforcement    Bowel and bladder management, taught by Lucillie Fitch, PT at 03/06/2024  2:38 PM.  Learner: Patient  Readiness: Acceptance  Method: Explanation, Teach Back, Demonstration  Response: Verbalizes Understanding, Demonstrated Understanding, Able to Teach Back, Needs Reinforcement    Precautions, taught by Lucillie Fitch, PT at 03/06/2024  2:38 PM.  Learner: Patient  Readiness: Acceptance  Method: Explanation, Teach Back, Demonstration  Response:  Verbalizes Understanding, Demonstrated Understanding, Able to Teach Back, Needs Reinforcement    Pain management, taught by Lucillie Fitch, PT at 03/06/2024  2:38 PM.  Learner: Patient  Readiness: Acceptance  Method: Explanation, Teach Back, Demonstration  Response: Verbalizes Understanding, Demonstrated Understanding, Able to Teach Back, Needs Reinforcement    Functional transfers/mobility, taught by Lucillie Fitch, PT at 03/06/2024  2:38 PM.  Learner: Patient  Readiness: Acceptance  Method: Explanation, Teach Back, Demonstration  Response: Verbalizes Understanding, Demonstrated Understanding, Able to Teach Back, Needs Reinforcement    Fall prevention/balance training, taught by Lucillie Fitch, PT at 03/06/2024  2:38 PM.  Learner: Patient  Readiness: Acceptance  Method: Explanation, Teach Back, Demonstration  Response: Verbalizes Understanding, Demonstrated Understanding, Able to Teach Back, Needs Reinforcement    ADL retraining, taught by Lucillie Fitch, PT at 03/06/2024  2:38 PM.  Learner: Patient  Readiness: Acceptance  Method: Explanation, Teach Back, Demonstration  Response: Verbalizes Understanding, Demonstrated Understanding, Able to Teach Back, Needs Reinforcement    Education Comments  No comments found.         Assessment & Plan   Pt reports today  being the worst day for R UE, but was able to participate in session. Pt continues to display lethargy intermittently during session, requiring cuing to stay on task. Pt requested to use the bathroom twice during session, c/o taking double dose of laxative this morning, affecting plan for session activities. Pt unable to walk during session due to urgent need to return to bathroom. Pt can benefit from continued PT services to progress mobility, maximize functional independence and decrease caregiver burden upon discharge.      Plan:  Risks/Benefits/POC Discussed with Pt/Family: With patient/family  Treatment/Interventions: Exercise, Gait training, Stair  training, Functional transfer training, LE strengthening/ROM, Endurance training, Cognitive reorientation, Patient/family training, Compensatory technique education    Recommendation:  Discharge Recommendation: Home with supervision, Home with home health PT  PT Therapy Recommendation: 5-6 days/week, 60-120 mins/day, 1:1 treatment, Group therapy  PT Estimated Length of Stay: 7-10 days from eval 10/8

## 2024-03-06 NOTE — Progress Notes (Deleted)
 Psychology Services  Inpatient Rehabilitation Consultation    Patient Name:  Jennifer [name]       Medical Record Number: [id number]   Date of Birth: May 07, 1956  Sex: Female          Room/Bed:  M555/M555.01    Rehabilitation Precautions/Restrictions:  Precautions  Weight Bearing Status: RUE non weight bearing  Other Precautions: fall, seizure, sling at all times, cognitive impairments       Rehab Diagnosis: Seizures (CMS/HCC) [R56.9]      Past Medical History:   Medical History[1]      Behavioral Observations and Mental Status:  The patient was seen for a psychological session in the patient's room for adjustment to medical situation and rehabilitation hospitalization in accordance with the attending physician's initial plan of care. The patient has consented to psychology/neuropsychology services. The patient was found in her room. She was alert and cooperative. Jennifer [name] was oriented to person, place, time, date and situation. Affect was WNL, and she made appropriate eye contact. She was engaging throughout the session and demonstrated spontaneous speech. Personal grooming and hygiene were good. Speech was of normal rhythm, volume, rate, and prosody.  Processing speed was within normal limits. Behavioral signs of pain were not observed. There was no evidence of her responding to internal stimuli, and there was no delusional content in her speech. At the time of the session, the patient did not appear to be in or report any acute distress. Thought process was linear and goal oriented; content was appropriate. Patient did not endorse any suicidal ideation, intent, and/or plan.    O-log: 30 / 30 - fully Oriented    C-SSRS: Patient did not endorse any suicidal ideation, intent, and/or plan.        Assessment  Relevant background information was gathered.     Current mental health: The patient reported adjustment related challenges to pain management. She also endorsed significant stress from interpersonal factors  and the affect of those dynamics on her treatment. The patient was given space to express frustrations and concerns. The patient did not endorse other symptoms of psychopathology.     Mental health history: Patient did not endorse a history of mental health conditions or intervention; however, she reported taking clonazepam  for sleep due to side effects from Epilepsy medication.    PHQ-9: 2/25, not clinically significant for depression.    HSQ-10 [facility] Stress Questionnaire): 9/100 - mild stress related to dependency    The patient was provided with supportive counseling from a person centered and cognitive behavioral perspective. This clinician explored and reflected the patient's emotional process with adjustment. Support and encouragement were provided. Goals and personal values were explored, and coping strategies were integrated. Patient's hospital course and therapeutic process were discussed at length. The patient is in agreement with the plan of care and rehabilitation process.      Plan  Facilitate adaptive coping and adjustment to medical symptoms and related stressors.    Recommendations: Individual and co-treat as needed         [1]   Past Medical History:  Diagnosis Date    Abnormal vision     glasses    Arrhythmia 2018    SVT (had ablation)    Arthritis     Complication of anesthesia     urinary retention with every joint replacement, BP drop with spinal X1, another time spinal didn't work    Convulsions (CMS/HCC)     epilepsy, last seizure 2010  Deep venous thrombosis of distal lower extremity (CMS/HCC) 2018    after cholecystectomy; left leg    Diverticulitis     takes miralax     Encounter for blood transfusion 2018    Gastric ulceration 2018    GI bleed with requiring 4 units PRBCs per pt    Hyperlipidemia     Hypothyroidism     

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
## 2024-03-06 NOTE — Progress Notes (Signed)
 Therapeutic Recreation  Inpatient Rehabilitation Progress Note    Patient Name:  Jennifer [name]       Medical Record Number: [id number]   Date of Birth: [birth year]  Sex: Female          Room/Bed:  M555/M555.01    Therapy Received:  Start Time: 3:00   Stop Time: 3:30  Total Therapy Minutes: 30    Rehabilitation Precautions/Restrictions:  Weight Bearing Status: RUE non weight bearing  Other Precautions: fall, seizure, sling at all times, cognitive impairments    Rehab Diagnosis: Seizures (CMS/HCC) [R56.9]     Subjective   Patient Report: I am sad today .  Patient/Caregiver Goals: Stop falling.  Therapeutic Recreation  Intellectual Interests: Reading, Puzzles  Home Activities: Television  Activities: Relaxation therapy, Music therapy, Exercise, Arts/Crafts    Objective   Pt received in bed, requested to stay in bed. Pt discussing personal stressors and feelings of being down. Support given, pt appreciated hearing recovery is not linear and the strategy of recognizing the wins of each day.   Education Documentation  No documentation found.  Education Comments  No comments found.        Assessment & Plan   Pt limited by fatigue this hour, benefiting from bedside therapeutic listening and encouragement.   Groups appropriate for patient:        Group Justification: Improve coping and therapeutic participation with support by peers  Practice /carry over skills learned in individual sessions with new partners, clinicians, and context  Emotional and/or spiritual support from peers experiencing similar circumstances

## 2024-03-06 NOTE — Plan of Care (Signed)
 Problem: Pain Management  Goal: LTG: Patient will verbalize pain relief less than 2/10 with the use of pain medication and nonpharmacological pain measures such as deep breathing, relaxation techniques and repositioning 100% of the time  Outcome: Progressing     Problem: Skin Wound Management  Goal: LTG: Patient will show no evidence of skin breakdown during hospitalization.  Outcome: Progressing     Problem: Risk of Infection  Goal: LTG: Patient incisions and abrasions will be free of any signs or symptoms of infection and will advance to healed by at least 50% by discharge date.  Outcome: Progressing     Problem: Safety Risk  Goal: LTG: Patient will acknowledge limitations and call for assistance before transferring in and out of bed and with ambulation 100% of the tim  Outcome: Progressing     Seizures and fall precautions in place. Shoulder sling on at all times. Pt is continent of B/B, assisted to the bathroom using W/C. Pt verbalized severe pain in Rt shoulder relieved by Oxycodone  10mg , No s/s of infection, safety maintained.

## 2024-03-06 NOTE — Individualized Plan of Care (Signed)
 Physical Medicine and Rehabilitation  Individualized Overall Plan of Care    Patient Name: Jennifer [name]   Medical Record Number: [id number]   Date of Birth: 1955/11/22   Age: 68 y.o.   Sex: Female     Primary Rehab (Etiologic) Diagnosis: Seizures (CMS/HCC)      Admit Date/Time: 03/03/2024  6:53 PM     Prognosis: good    Plan of Care  Anticipated Discharge Date: 03/13/2024  Estimated LOS: 10 days  Anticipated Discharge Destination: home  Is the duration of therapy 15 hours over 7 days instead of the standard 3 hours of therapy, 5 out of 7 days a week?: No  Medical Necessity Expected Level Rationale: Monitor gait, ataxia, right shoulder pain, postop wound, anemia, SVT, nutrition and hydration will be optimized.  The patient will benefit from continued services by: Physical therapy 60-120 min daily, 5-6 times per week for 10 days, Occupational therapy 60-120 min daily, 5-6 times per week for 10 days, Therapeutic recreation, Case management, and Rehabilitation nursing    The following is a list of patient problems that have been identified by the interdisciplinary team:   Encounter Problems       Encounter Problems (Active)       Compromised Activity/Mobility       Activity/Mobility Interventions       Start:  03/03/24    Expected End:  04/04/24       Interventions:  1. Pad bony prominences, TAP Seated positioning system when OOB, Promote PMP, Reposition q 2 hrs/turn clock, Offload heels       Initial Last    Activity/Mobility Interventions: Pad bony prominences, TAP Seated positioning system when OOB, Promote PMP, Reposition q 2 hrs / turn clock, Offload heels Pad bony prominences, TAP Seated positioning system when OOB, Promote PMP, Reposition q 2 hrs / turn clock, Offload heels            Compromised Friction/Shear       Friction and Shear Interventions       Start:  03/06/24       Interventions:  1. Pad bony prominences, Off load heels, HOB 30 degrees or less unless contraindicated, Consider: TAP seated  positioning, Heel foams       Initial    Friction and Shear Interventions: Pad bony prominences, Off load heels, HOB 30 degrees or less unless contraindicated, Consider: TAP seated positioning, Heel foams            Compromised Moisture       Moisture level Interventions       Start:  03/03/24    Expected End:  04/04/24       Interventions:  1. Moisture wicking products, Moisture barrier cream       Initial Last    Moisture level Interventions: Moisture wicking products, Moisture barrier cream Moisture wicking products, Moisture barrier cream            Mobility       LTG: Pt will transfer and ambulate x50' independently with LRAD to navigate home environment        Start:  03/04/24               Pain Management       LTG: Patient will verbalize pain relief less than 2/10 with the use of pain medication and nonpharmacological pain measures such as deep breathing, relaxation techniques and repositioning 100% of the time       Start:  03/04/24  Expected End:  04/04/24               Pain interferes with ability to perform ADL       Pain at adequate level as identified by patient       Start:  03/03/24    Expected End:  04/04/24       Interventions:  1. Identify patient comfort function goal  2. Evaluate if patient comfort function goal is met  3. Assess pain on admission, during daily assessment and/or before any as needed intervention(s)  4. Reassess pain within 30-60 minutes of any procedure/intervention, per Pain Assessment, Intervention, Reassessment (AIR) Cycle  5. Evaluate patient's satisfaction with pain management progress  6. Offer non-pharmacological pain management interventions  7. Consult/collaborate with Pain Service  8. Consult/collaborate with Physical Therapy, Occupational Therapy, and/or Speech Therapy  9. Assess for risk of opioid induced respiratory depression and side effects, including snoring/sleep apnea. Alert healthcare team of risk factors identified.  10. Include patient/patient care  companion in decisions related to pain management as needed            Risk of Infection       LTG: Patient incisions and abrasions will be free of any signs or symptoms of infection and will advance to healed by at least 50% by discharge date.       Start:  03/04/24    Expected End:  04/04/24               Safety Risk       LTG: Patient will acknowledge limitations and call for assistance before transferring in and out of bed and with ambulation 100% of the tim       Start:  03/04/24    Expected End:  04/04/24               Self Care Management       LTG: Pt will complete daily grooming routine with mod I       Start:  03/04/24    Expected End:  03/11/24               Side Effects from Pain Analgesia       Patient will experience minimal side effects of analgesic therapy       Start:  03/03/24    Expected End:  04/04/24       Interventions:  1. Monitor/assess patient's respiratory status (RR depth, effort, breath sounds)  2. Assess for changes in cognitive function   3. Prevent/manage side effects per LIP orders (i.e. nausea, vomiting, pruritus, constipation, urinary retention, etc.)  4. Evaluate for opioid-induced sedation with appropriate assessment tool (i.e. POSS)            Skin Wound Management       LTG: Patient will show no evidence of skin breakdown during hospitalization.       Start:  03/04/24    Expected End:  04/04/24                    Anticipated Interventions    The patient will undergo inpatient rehabilitation to manage complex medical and rehabilitation needs related to Seizures (CMS/HCC).    The patient will receive interdisciplinary care, including daily rehabilitation physician management for the following: Pain, Monitoring for medication side effects, Nutrition, and ataxia, seizure, right shoulder pain postop, anemia, SVT, and insomnia.    Patient goals were discussed and agreed upon with the patient. I have reviewed the  therapy notes and agree with the current plan of care.      Ozell GORMAN Lawyer, MD  03/06/2024 1:50 PM

## 2024-03-07 ENCOUNTER — Encounter: Payer: Self-pay | Admitting: Pain Medicine

## 2024-03-07 NOTE — OT Progress Note (Signed)
 Occupational Therapy  Inpatient Rehabilitation Weekly Progress Note    Patient Name:  Jennifer [name]       Medical Record Number: [id number]   Date of Birth: Sep 26, 1955  Sex: Female          Room/Bed:  M555/M555.01    Therapy Received:  Start Time: 0800   Stop Time: 0900  Total Therapy Minutes: 60    Rehabilitation Precautions/Restrictions:  Weight Bearing Status: RUE non weight bearing  Other Precautions: fall, seizure, sling at all times, cognitive impairments, shoulder replacmenet precautions - see surgeons guide in pt's binder    Rehab Diagnosis: Seizures (CMS/HCC) [R56.9]     Subjective   Patient Report: I had been lethargic, but I think I am doing much better.  Patient/Caregiver Goals: Stop falling.  Pain Assessment:  Pain Assessment: Numeric Scale (0-10)  Pain Score: 5-moderate pain  Pain Location: Shoulder  Pain Orientation: Right  Pain Descriptors: Aching  Pain Frequency: Increases with movement  Pain Intervention(s): Medication (See eMAR), Repositioned, Rest    Objective   Vitals:  Heart Rate: 64  BP: 96/48  MAP (mmHg): (!) 64  SpO2: 99 %    Interventions: Pt cleared for therapy and for OT to work with her on a shower.  Pt seen for ADL retraining and advanced to shower from seated position on bench.  Standing briefly for washing buttocks with supervision.  See below for specifics.    Self Care Functional Status:   Current Status Current Status Discharge Goal   Functional Area: Care Score: Comments:    Eating 3  Independent   Oral Hygiene 5 seated at sink Independent   Toileting Hygiene 1 Required total assist for hygiene after a bowel movement Independent   Shower/Bathe Self 3 Helper washes left UE and buttocks; dries LE's Independent   Upper Body Dressing 3 Cues and helper assists advancing and holding shirt above elbow and straightens shirt Independent   Lower Body Dressing 3 Helper advances pants/underwear over right hip Independent   Putting On/Taking Off Footwear 3  Independent     Mobility  Functional Status:   Current   Status Current Status Discharge Goal   Functional Area: Care Score: Comments:    Roll Left and Right        Sit to Lying        Lying to Sitting on Side of Bed        Sit to Stand        Chair/Bed-to-Chair Transfer        Toilet Transfer 4 Supervision for safety; grab bar is on pt's left so she doesnt attempt to reach for it with RUE Pharmacist, hospital Up Object 4  Independent     Education Documentation  Safety issues and interventions, taught by Tanda Rock BIRCH, OT at 03/07/2024 12:12 PM.  Learner: Patient  Readiness: Acceptance  Method: Explanation, Demonstration  Response: Needs Reinforcement    Equipment, taught by Tanda Rock BIRCH, OT at 03/07/2024 12:12 PM.  Learner: Patient  Readiness: Acceptance  Method: Explanation, Demonstration  Response: Needs Reinforcement    Functional transfers/mobility, taught by Tanda Rock BIRCH, OT at 03/07/2024 12:12 PM.  Learner: Patient  Readiness: Acceptance  Method: Explanation, Demonstration  Response: Needs Reinforcement    Fall prevention/balance training, taught by Tanda Rock BIRCH, OT at 03/07/2024 12:12 PM.  Learner: Patient  Readiness: Acceptance  Method: Explanation, Demonstration  Response: Needs Reinforcement  ADL retraining, taught by Tanda Rock BIRCH, OT at 03/07/2024 12:12 PM.  Learner: Patient  Readiness: Acceptance  Method: Explanation, Demonstration  Response: Needs Reinforcement    Education Comments  No comments found.        Assessment & Plan   Pt demo excellent engagement in session and improved to no evidence of lethargy over the hour.  She need mod cues within tasks to avoid using her operated arm against precautions.  Making strides, but family will benefit from education about pt's need for help to prevent future falls.    Plan:  Risks/Benefits/POC Discussed with Pt/Family: With patient  Treatment Interventions: ADL retraining, Functional transfer training, Endurance training, Patient/Family training,  Equipment eval/education, Compensatory technique education    Recommendation:  Discharge Recommendation: Home with home health OT  OT Estimated Length of Stay: 7-10 days

## 2024-03-07 NOTE — Plan of Care (Signed)
 Problem: Pain Management  Goal: LTG: Patient will verbalize pain relief less than 2/10 with the use of pain medication and nonpharmacological pain measures such as deep breathing, relaxation techniques and repositioning 100% of the time  Outcome: Progressing  Note: Patient on scheduled pain medications and also requesting for PRN  medication for pain management with helpful result.     Problem: Skin Wound Management  Goal: LTG: Patient will show no evidence of skin breakdown during hospitalization.  Outcome: Progressing  Note: Continue to encourage TARP Q 2-3 hrs while in bed and off load weight Q 30 minutes while up in w/chair in order to relieve pressure thus prevent further skin issues.     Problem: Risk of Infection  Goal: LTG: Patient incisions and abrasions will be free of any signs or symptoms of infection and will advance to healed by at least 50% by discharge date.  Outcome: Progressing  Note: Continue with incision care as ordered in order to promote prompt wound healing. No S/S of infection observed     Problem: Safety Risk  Goal: LTG: Patient will acknowledge limitations and call for assistance before transferring in and out of bed and with ambulation 100% of the tim  Outcome: Progressing  Note: Patient is calling staff for assistance prior ttransfering at least 75% of the time in order to prevent falls with injury

## 2024-03-07 NOTE — Plan of Care (Signed)
 Problem: Pain Management  Goal: LTG: Patient will verbalize pain relief less than 2/10 with the use of pain medication and nonpharmacological pain measures such as deep breathing, relaxation techniques and repositioning 100% of the time  Outcome: Progressing     Problem: Mobility  Goal: LTG: Pt will transfer and ambulate x50' independently with LRAD to navigate home environment   Outcome: Progressing     Problem: Risk of Infection  Goal: LTG: Patient incisions and abrasions will be free of any signs or symptoms of infection and will advance to healed by at least 50% by discharge date.  Outcome: Progressing     Problem: Safety Risk  Goal: LTG: Patient will acknowledge limitations and call for assistance before transferring in and out of bed and with ambulation 100% of the tim  Outcome: Progressing     Seizures and fall precautions in place. Shoulder sling on at all times. Pt is continent of B/B, assisted to the bathroom using W/C. Pt verbalized severe pain in Rt shoulder relieved by Oxycodone  10mg , No s/s of infection, safety maintained.

## 2024-03-07 NOTE — PT Progress Note (Signed)
 Physical Therapy  Inpatient Rehabilitation Daily Progress Note    Patient Name:  Jennifer [name]       Medical Record Number: [id number]   Date of Birth: Jan 23, 1956  Sex: Female          Room/Bed:  M555/M555.01    Therapy Received:  Start Time: 1000   Stop Time: 1200  Total Therapy Minutes: 120    Rehabilitation Precautions/Restrictions:  Weight Bearing Status: RUE non weight bearing  Other Precautions: fall, seizure, sling at all times, cognitive impairments, shoulder replacmenet precautions - see surgeons guide in pt's binder    Rehab Diagnosis: Seizures (CMS/HCC) [R56.9]     Subjective   Patient Report: I think I am feeling good today   Patient/Caregiver Goals: Stop falling.  Pain Assessment:  Pain Assessment: Wong-Baker FACES  Wong-Baker FACES Pain Rating: Hurts little more  Pain Location: Shoulder  Pain Orientation: Right  Pain Descriptors: Aching  Pain Frequency: Intermittent  Pain Intervention(s): Distraction    Objective   Vitals:  Heart Rate: 68  BP: 103/68  BP Location: Left arm  BP Method: Automatic  MAP (mmHg): 79  Patient Position: Sitting      Interventions:     Handoff from RN who reports pt is appropriate for therapy. Pt received sitting up in bed with husband in room (remained in room throughout session). Agreeable to therapy. Transferred to EOB with min A. Stand pivot transfer to w/c with no AD and CGA. Pt dependently taken to [facility] gym.     Ambulated X80' x70' ea with hemiwalker, CGA, and w/c follow to allow max distance tolerated. Pt then reported needing to use the bathroom - dependently taken back to room. SPV stand pivot on/off elevated toilet seat with use of grab bars. Mod A for clothing mgmt.. PT dependently completed posterior pericare due to R shoulder limitations. Dependently taken back to gym.     Ambulatory transfer on/off NuStep with no AD and CGA. X10 min on L0 with BLE and LUE only.     Pt transferred to supine on mat table with CGA for transfer and min A once sitting to doff sling  and max VC for maintaining sitting balance to prevent extension at RLE. In supine, PT completed PROM 2x15 in ER to 30 deg and flexion to about 70 deg with rest in between groups. Then instructed on AAROM of elbow F/E and wrist F/E. X10 isometric deltoid contractions with 5 sec hold. Max A at trunk to sit up from table with PT holding RUE in place. While pt was on table - discussed PT goals and introduced pt to binder. Created walking, stairs, and bed mobility goals as main focus for physical therapy. Educated on role of home health and eventual transition to OP PT.     Pt ascended/descended x4 6 stais with single HR (L ascending) per home set up with CGA. Instructed pt on side stepping when descending due to rail on affected side.     Pt dependently taken back to room. Left with all immediate needs met.     Education Documentation  Safety issues and interventions, taught by Fernando Quarry, PT at 03/07/2024 10:00 AM.  Learner: Patient  Readiness: Acceptance  Method: Explanation, Demonstration  Response: Merrell Understanding, Demonstrated Understanding    Rehab techniques/procedure, taught by Fernando Quarry, PT at 03/07/2024 10:00 AM.  Learner: Patient  Readiness: Acceptance  Method: Explanation, Demonstration  Response: Merrell Understanding, Demonstrated Understanding    Orthotics/prosthetics, taught by Fernando Quarry, PT at 03/07/2024  10:00 AM.  Learner: Patient  Readiness: Acceptance  Method: Explanation, Demonstration  Response: Verbalizes Understanding, Demonstrated Understanding    Home management activities, taught by Fernando Quarry, PT at 03/07/2024 10:00 AM.  Learner: Patient  Readiness: Acceptance  Method: Explanation, Demonstration  Response: Merrell Understanding, Demonstrated Understanding    Home exercise program, taught by Fernando Quarry, PT at 03/07/2024 10:00 AM.  Learner: Patient  Readiness: Acceptance  Method: Explanation, Demonstration  Response: Merrell Understanding,  Demonstrated Understanding    Precautions, taught by Fernando Quarry, PT at 03/07/2024 10:00 AM.  Learner: Patient  Readiness: Acceptance  Method: Explanation, Demonstration  Response: Merrell Understanding, Demonstrated Understanding    Plan of care, taught by Fernando Quarry, PT at 03/07/2024 10:00 AM.  Learner: Patient  Readiness: Acceptance  Method: Explanation, Demonstration  Response: Verbalizes Understanding, Demonstrated Understanding    Pain management, taught by Fernando Quarry, PT at 03/07/2024 10:00 AM.  Learner: Patient  Readiness: Acceptance  Method: Explanation, Demonstration  Response: Verbalizes Understanding, Demonstrated Understanding    Education Comments  No comments found.         Assessment & Plan     Pt with improved ambulation tolerance and stability today  noted in increased distances and decreased LOB, likely affected by improved overall arousal. Shoulder flexion PROM limited to 70 deg (up to 90 allowed per surgeon). Will continue to benefit from stair training to access home and balance training to decrease fall risk.     Plan:  Risks/Benefits/POC Discussed with Pt/Family: With patient/family  Treatment/Interventions: Exercise, Gait training, Stair training, Functional transfer training, LE strengthening/ROM, Endurance training, Cognitive reorientation, Patient/family training, Compensatory technique education    Recommendation:  Discharge Recommendation: Home with supervision, Home with home health PT  PT Therapy Recommendation: 5-6 days/week, 60-120 mins/day, 1:1 treatment, Group therapy  PT Estimated Length of Stay: 7-10 days from eval 10/8

## 2024-03-07 NOTE — PT Progress Note (Signed)
 Physical Therapy  Inpatient Rehabilitation Daily Progress Note    Patient Name:  Jennifer [name]       Medical Record Number: [id number]   Date of Birth: 15-Oct-1955  Sex: Female          Room/Bed:  M555/M555.01    Therapy Received:  Start Time: 1330   Stop Time: 1400  Total Therapy Minutes: 30 - missed minutes session    Rehabilitation Precautions/Restrictions:  Weight Bearing Status: RUE non weight bearing  Other Precautions: fall, seizure, sling at all times, cognitive impairments, shoulder replacmenet precautions - see surgeons guide in pt's binder    Rehab Diagnosis: Seizures (CMS/HCC) [R56.9]     Subjective   Patient Report: That was good for my legs!  Patient/Caregiver Goals: Stop falling.  Pain Assessment:  Pain Assessment: Numeric Scale (0-10)  Pain Score: 8-severe pain  Pain Location: Shoulder  Pain Orientation: Right  Pain Intervention(s): Medication (See eMAR)    Objective   Interventions:   Pt cleared by RN Borteh, no acute medical needs at this time.  Pt received supine in bed at beginning of session, agreeable for therapy.  Supine>sit modA at trunk; sit>stand + amb to bathroom with CGA + HW.  Pants and hygeine managed by PT dependently - +BM, +void; then amb to [facility] CGA. Dependently transported to therapy gyms in w/c for energy conservation, efficiency and safety.      Pt practiced Endurance activity on Nustep Recumbent Stepper using BLE and LUE  Total Time: 8 minutes  Resistance: L2  Average SPM/cadence: 49 SPM  Total Distance: 0.22 miles    Upon return to room, SPT c HW to bed CGA.  Sit>supine with modA at trunk.  Increased time with blocking of feet to scoot superiorly in bed.     Patient Disposition: in room, in bed  Safety Interventions: handoff to nurse, bed alarm activated, personal items within reach, assistive devices out of reach, bed placed in lowest position, telesitter in use     Education Documentation  Equipment, taught by Wing, Aarvi Stotts, PT at 03/07/2024  1:30 PM.  Learner:  Patient  Readiness: Acceptance  Method: Explanation  Response: Verbalizes Understanding  Comment: HW for room    Education Comments  No comments found.         Assessment & Plan   Patient tolerates 30 minute missed minute session on this date with focus on toileting and endurance.  Continues to need assistance with LE management for bed mobility.  Continue PT POC per primary PT.     Plan:  Risks/Benefits/POC Discussed with Pt/Family: With patient/family  Treatment/Interventions: Exercise, Gait training, Stair training, Functional transfer training, LE strengthening/ROM, Endurance training, Cognitive reorientation, Patient/family training, Compensatory technique education    Recommendation:  Discharge Recommendation: Home with supervision, Home with home health PT  PT Therapy Recommendation: 5-6 days/week, 60-120 mins/day, 1:1 treatment, Group therapy  PT Estimated Length of Stay: 7-10 days from eval 10/8
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
## 2024-03-07 NOTE — Nursing Progress Note (Signed)
 Patient is alert and able to verbalize her needs. Shower given by OT this AM and tolerated with no complaints. Oxycodone  given this PM as per PRN order for pain management on Rt arm fractured arm with sling in place for proper alignment. Assisted to the bathroom x 4 this shift and has a BM X 2. Remain in her room calm and quiet.

## 2024-03-07 NOTE — Progress Notes (Signed)
 PHYSICAL MEDICINE AND REHABILITATION  PROGRESS NOTE -- FACE-TO-FACE ENCOUNTER    Date Time: 03/07/24 6:18 PM  Patient Name: Jennifer [name], Jennifer [name]    Admission date:  03/03/2024    Subjective:     Per nursing, patient is more lucid today .  No acute problems identified.  Neurology appreciated, Int Med appreciated.    Patient denies new symptoms, No chest pain or shortness of breath, No fevers or chills, No nausea, vomiting, diarrhea, abdominal pain, Tolerating therapies, No acute issues identified, and No change in neurologic symptoms        Medications:   Medication reviewed by me:     Scheduled Meds: PRN Meds:   aspirin  EC, 325 mg, Oral, Daily  clonazePAM , 1 mg, Oral, Daily  docusate sodium , 100 mg, Oral, Daily  gabapentin , 200 mg, Oral, Q8H  heparin (porcine), 5,000 Units, Subcutaneous, Q8H [facility]  lacosamide , 200 mg, Oral, BID  levETIRAcetam , 1,500 mg, Oral, Q12H SCH  levothyroxine , 100 mcg, Oral, Daily at 0600  metoprolol  succinate XL, 25 mg, Oral, Daily  pantoprazole, 40 mg, Oral, QAM AC  polyethylene glycol, 17 g, Oral, Daily  rosuvastatin , 5 mg, Oral, QHS  triamterene -hydrochlorothiazide , 1 tablet, Oral, Daily  [START ON 03/08/2024] vitamin D  (ergocalciferol ), 50,000 Units, Oral, Weekly        Continuous Infusions:   lactulose, 10 g, Daily PRN  naloxone , 0.4 mg, PRN  ondansetron , 4 mg, Q8H PRN   Or  ondansetron , 4 mg, Q8H PRN  oxyCODONE , 10 mg, Q4H PRN  oxyCODONE , 5 mg, Q4H PRN  polyethylene glycol, 17 g, Daily PRN          Medication Review  [name] complete drug regimen review was completed: Yes  Were any drug issues found during review?: No        If yes   Was I contacted and action was taken by midnight of the next calendar day once issue was identified?: N/[name]   Person who contacted me: N/[name]  What was the issue?: N/[name]  Action taken: N/[name]  What was the time the issue was identified?: N/[name]    Review of Systems:   [name] comprehensive review of systems was: No fevers, chills, nausea, vomiting, chest pain, shortness of breath,  cough, headache, double vision.  All others negative.    Physical Exam:     Vitals:    03/07/24 0913 03/07/24 1000 03/07/24 1101 03/07/24 1807   BP: 95/54 103/68 103/68 100/58   Pulse: 60 68 68 66   Resp: 18   18   Temp: 98.8 F (37.1 C)   99 F (37.2 C)   TempSrc: Oral   Oral   SpO2: 94%   98%   Weight:       Height:           Intake and Output Summary (Last 24 hours) at Date Time    Intake/Output Summary (Last 24 hours) at 03/07/2024 1818  Last data filed at 03/07/2024 1350  Gross per 24 hour   Intake 1100 ml   Output 5 ml   Net 1095 ml     P.O.: 480 mL (03/07/24 1245)     Urine: 1 mL (03/07/24 1350)  Bladder Scan Volume (mL): 105 mL (03/06/24 1600)  Intermittent/Straight Cath (mL): 500 mL (03/04/24 0600)         Awake and Alert, No acute distress, Resting comfortably, No respiratory distress, and No calf tenderness        Labs:   No results for input(s): GLUCOSEWHOLE in  the last 24 hours.    Recent Labs   Lab 03/06/24  0610 03/04/24  0525   WBC 6.79 6.97   Hemoglobin 10.2* 10.5*   Hematocrit 31.3* 32.3*   Platelet Count 357* 352*        Recent Labs   Lab 03/04/24  0525   Sodium 138   Potassium 4.1   Chloride 102   CO2 27   BUN 15   Creatinine 0.7   Calcium  9.0   Albumin  2.9*   Protein, Total 5.7*   Bilirubin, Total 0.5   Alkaline Phosphatase 57   ALT 14   AST (SGOT) 16   Glucose 97                       Rads:   Radiological Procedure reviewed.  No results found.        Assessment and Plan:     68 y.o. female with fall, recent Right shoulder replacement    Problem List[1]         Continue comprehensive and intensive inpatient rehab program, including:   Physical therapy 60-120 min daily, 5-6 times per week, Occupational therapy 60-120 min daily, 5-6 times per week, Case management and Rehabilitation nursing    Neurology appreciated, follow up with recommendations.  Continue keppra  and vimpat  for seizure prophylaxis  NWB RUE in shoulder sling  Outpatient ortho follow up  Follow H/H  Continue cardiac  medications  DVT ppx - SQ heparin        Signed by: Franky JULIANNA Coats, MD    [facility]    If there are questions or concerns about the content of this note or information contained within the body of this dictation they should be addressed directly with the author for clarification.                 [1]   Patient Active Problem List  Diagnosis    Primary osteoarthritis of left knee    Mass of right breast, unspecified quadrant    Ventricular tachyarrhythmia (CMS/HCC)    Hypertriglyceridemia    Hypothyroidism, unspecified type    DDD (degenerative disc disease), lumbosacral    Prediabetes    Class 3 severe obesity with serious comorbidity and body mass index (BMI) of 40.0 to 44.9 in adult, unspecified obesity type (CMS/HCC)    Seizure disorder (CMS/HCC)    SVT (supraventricular tachycardia)    History of colon resection    History of cholecystectomy    History of maternal deep vein thrombosis (DVT)    Morbid obesity with BMI of 45.0-49.9, adult (CMS/HCC)    Morbid obesity (CMS/HCC)    Seizures (CMS/HCC)

## 2024-03-08 NOTE — Plan of Care (Signed)
 Problem: Pain Management  Goal: LTG: Patient will verbalize pain relief less than 2/10 with the use of pain medication and nonpharmacological pain measures such as deep breathing, relaxation techniques and repositioning 100% of the time  Outcome: Progressing  Note: Continue on scheduled pain management and also request for PRN Oxycodone  for increase pain     Problem: Skin Wound Management  Goal: LTG: Patient will show no evidence of skin breakdown during hospitalization.  Outcome: Progressing  Note: Continue with daily skin care and encourage TARP Q 2-3 hrs while in bed in order to relieve pressure so as to prevent skin break.     Problem: Risk of Infection  Goal: LTG: Patient incisions and abrasions will be free of any signs or symptoms of infection and will advance to healed by at least 50% by discharge date.  Outcome: Progressing  Note: Aquacel dsg remain in place on Rt shouler incision with no drainage noted on dressing. No S/S of infection noted.     Problem: Safety Risk  Goal: LTG: Patient will acknowledge limitations and call for assistance before transferring in and out of bed and with ambulation 100% of the tim  Outcome: Progressing  Note: Patient is calling appropriately for staff assist prior to transferring in order to prevent falls with injury.

## 2024-03-08 NOTE — Nursing Progress Note (Signed)
 Patient is alert and able to verbalize her needs. ADL care provided and remain in bed. Sling in place on her Rt arm for proper alignment d/t Rt shoulder surgical repair. Requested x 3 this shift for PRN Oxycodone  for increase pain on her Rt shoulder with some relief. Discharge education done on her medication list and blood sugar management. Verbalized understanding.
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
## 2024-03-08 NOTE — Progress Notes (Signed)
 PHYSICAL MEDICINE AND REHABILITATION  PROGRESS NOTE -- FACE-TO-FACE ENCOUNTER    Date Time: 03/08/24 11:27 AM  Patient Name: Jennifer [name], Jennifer [name]    Admission date:  03/03/2024    Subjective:     No acute problems identified.  Neurology appreciated, Int Med appreciated.    Patient denies new symptoms, No chest pain or shortness of breath, No fevers or chills, No nausea, vomiting, diarrhea, abdominal pain, Tolerating therapies, No acute issues identified, and No change in neurologic symptoms        Medications:   Medication reviewed by me:     Scheduled Meds: PRN Meds:   aspirin  EC, 325 mg, Oral, Daily  clonazePAM , 1 mg, Oral, Daily  docusate sodium , 100 mg, Oral, Daily  gabapentin , 200 mg, Oral, Q8H  heparin (porcine), 5,000 Units, Subcutaneous, Q8H [facility]  lacosamide , 200 mg, Oral, BID  levETIRAcetam , 1,500 mg, Oral, Q12H [facility]  levothyroxine , 100 mcg, Oral, Daily at 0600  metoprolol  succinate XL, 25 mg, Oral, Daily  pantoprazole, 40 mg, Oral, QAM AC  polyethylene glycol, 17 g, Oral, Daily  rosuvastatin , 5 mg, Oral, QHS  triamterene -hydrochlorothiazide , 1 tablet, Oral, Daily  vitamin D  (ergocalciferol ), 50,000 Units, Oral, Weekly        Continuous Infusions:   lactulose, 10 g, Daily PRN  naloxone , 0.4 mg, PRN  ondansetron , 4 mg, Q8H PRN   Or  ondansetron , 4 mg, Q8H PRN  oxyCODONE , 10 mg, Q4H PRN  oxyCODONE , 5 mg, Q4H PRN  polyethylene glycol, 17 g, Daily PRN          Medication Review  [name] complete drug regimen review was completed: Yes  Were any drug issues found during review?: No        If yes   Was I contacted and action was taken by midnight of the next calendar day once issue was identified?: N/[name]   Person who contacted me: N/[name]  What was the issue?: N/[name]  Action taken: N/[name]  What was the time the issue was identified?: N/[name]    Review of Systems:   [name] comprehensive review of systems was: No fevers, chills, nausea, vomiting, chest pain, shortness of breath, cough, headache, double vision.  All others negative.    Physical  Exam:     Vitals:    03/07/24 1101 03/07/24 1807 03/08/24 0557 03/08/24 0756   BP: 103/68 100/58 104/57 94/54   Pulse: 68 66 (!) 55 67   Resp:  18 15 18    Temp:  99 F (37.2 C) 97.7 F (36.5 C) 98.1 F (36.7 C)   TempSrc:  Oral Oral Oral   SpO2:  98% 96% 99%   Weight:   105.8 kg (233 lb 4 oz)    Height:           Intake and Output Summary (Last 24 hours) at Date Time    Intake/Output Summary (Last 24 hours) at 03/08/2024 1127  Last data filed at 03/08/2024 0755  Gross per 24 hour   Intake 1620 ml   Output 4 ml   Net 1616 ml     P.O.: 400 mL (03/08/24 0755)     Urine: 1 mL (03/07/24 2228)  Bladder Scan Volume (mL): 105 mL (03/06/24 1600)  Intermittent/Straight Cath (mL): 500 mL (03/04/24 0600)         Awake and Alert, No acute distress, Resting comfortably, No respiratory distress, and No calf tenderness        Labs:   No results for input(s): GLUCOSEWHOLE in the last  24 hours.    Recent Labs   Lab 03/06/24  0610 03/04/24  0525   WBC 6.79 6.97   Hemoglobin 10.2* 10.5*   Hematocrit 31.3* 32.3*   Platelet Count 357* 352*        Recent Labs   Lab 03/04/24  0525   Sodium 138   Potassium 4.1   Chloride 102   CO2 27   BUN 15   Creatinine 0.7   Calcium  9.0   Albumin  2.9*   Protein, Total 5.7*   Bilirubin, Total 0.5   Alkaline Phosphatase 57   ALT 14   AST (SGOT) 16   Glucose 97                       Rads:   Radiological Procedure reviewed.  No results found.        Assessment and Plan:     68 y.o. female with fall, recent Right shoulder replacement    Problem List[1]         Continue comprehensive and intensive inpatient rehab program, including:   Physical therapy 60-120 min daily, 5-6 times per week, Occupational therapy 60-120 min daily, 5-6 times per week, Case management and Rehabilitation nursing    Neurology appreciated, follow up with recommendations.  Continue keppra  and vimpat  for seizure prophylaxis  NWB RUE in shoulder sling  Outpatient ortho follow up  Follow H/H  Continue cardiac medications  DVT ppx - SQ  heparin        Signed by: Franky JULIANNA Coats, MD    [facility]    If there are questions or concerns about the content of this note or information contained within the body of this dictation they should be addressed directly with the author for clarification.                   [1]   Patient Active Problem List  Diagnosis    Primary osteoarthritis of left knee    Mass of right breast, unspecified quadrant    Ventricular tachyarrhythmia (CMS/HCC)    Hypertriglyceridemia    Hypothyroidism, unspecified type    DDD (degenerative disc disease), lumbosacral    Prediabetes    Class 3 severe obesity with serious comorbidity and body mass index (BMI) of 40.0 to 44.9 in adult, unspecified obesity type (CMS/HCC)    Seizure disorder (CMS/HCC)    SVT (supraventricular tachycardia)    History of colon resection    History of cholecystectomy    History of maternal deep vein thrombosis (DVT)    Morbid obesity with BMI of 45.0-49.9, adult (CMS/HCC)    Morbid obesity (CMS/HCC)    Seizures (CMS/HCC)

## 2024-03-08 NOTE — Plan of Care (Signed)
 Problem: Pain Management  Goal: LTG: Patient will verbalize pain relief less than 2/10 with the use of pain medication and nonpharmacological pain measures such as deep breathing, relaxation techniques and repositioning 100% of the time  Outcome: Progressing     Problem: Skin Wound Management  Goal: LTG: Patient will show no evidence of skin breakdown during hospitalization.  Outcome: Progressing     Problem: Risk of Infection  Goal: LTG: Patient incisions and abrasions will be free of any signs or symptoms of infection and will advance to healed by at least 50% by discharge date.  Outcome: Progressing     Problem: Safety Risk  Goal: LTG: Patient will acknowledge limitations and call for assistance before transferring in and out of bed and with ambulation 100% of the tim  Outcome: Progressing     Patient alert and oriented x4, calm and cooperative. Complained of pain, managed with PRN pain medication; no signs of discomfort observed throughout the shift. right arm sling  in place as ordered. Patient turns and repositions independently; education reinforced to continue frequent turning while in bed to prevent skin breakdown, patient verbalized understanding. Scheduled medications administered per MAR; tolerated without complaint. Safety precautions maintained for duration of shift. No acute events noted.

## 2024-03-09 LAB — URINALYSIS WITH REFLEX TO MICROSCOPIC EXAM - REFLEX TO CULTURE
Urine Bilirubin: NEGATIVE
Urine Blood: NEGATIVE
Urine Glucose: NEGATIVE
Urine Ketones: NEGATIVE mg/dL
Urine Nitrite: POSITIVE — AB
Urine Protein: NEGATIVE
Urine Specific Gravity: 1.017 (ref 1.001–1.035)
Urine Urobilinogen: NORMAL mg/dL (ref 0.2–2.0)
Urine pH: 6 (ref 5.0–8.0)

## 2024-03-09 LAB — LAB USE ONLY - CBC WITH DIFFERENTIAL
Absolute Basophils: 0.03 x10 3/uL (ref 0.00–0.08)
Absolute Eosinophils: 0.32 x10 3/uL (ref 0.00–0.44)
Absolute Immature Granulocytes: 0.03 x10 3/uL (ref 0.00–0.07)
Absolute Lymphocytes: 1.35 x10 3/uL (ref 0.42–3.22)
Absolute Monocytes: 0.47 x10 3/uL (ref 0.21–0.85)
Absolute Neutrophils: 5.1 x10 3/uL (ref 1.10–6.33)
Absolute nRBC: 0 x10 3/uL (ref <=0.00)
Basophils %: 0.4 %
Eosinophils %: 4.4 %
Hematocrit: 29.6 % — ABNORMAL LOW (ref 34.7–43.7)
Hemoglobin: 9.6 g/dL — ABNORMAL LOW (ref 11.4–14.8)
Immature Granulocytes %: 0.4 %
Lymphocytes %: 18.5 %
MCH: 31.5 pg (ref 25.1–33.5)
MCHC: 32.4 g/dL (ref 31.5–35.8)
MCV: 97 fL — ABNORMAL HIGH (ref 78.0–96.0)
MPV: 10.5 fL (ref 8.9–12.5)
Monocytes %: 6.4 %
Neutrophils %: 69.9 %
Platelet Count: 336 x10 3/uL (ref 142–346)
Preliminary Absolute Neutrophil Count: 5.1 x10 3/uL (ref 1.10–6.33)
RBC: 3.05 x10 6/uL — ABNORMAL LOW (ref 3.90–5.10)
RDW: 13 % (ref 11–15)
WBC: 7.3 x10 3/uL (ref 3.10–9.50)
nRBC %: 0 /100{WBCs} (ref <=0.0)

## 2024-03-09 NOTE — Nursing Progress Note (Signed)
 Patient is alert and able to verbalize her needs. Oxycodone  10 mg given prior to therapy with helpful result. Assisted to the bathroom x 3 and remain continent of both B&B. His husband came in to visit. Remain calm and stable.

## 2024-03-09 NOTE — Progress Notes (Signed)
 Psychology Services  Inpatient Rehabilitation Progress Note    Patient Name:  Jennifer [name]       Medical Record Number: [id number]   Date of Birth: [birth year]  Sex: Female          Room/Bed:  M555/M555.01       Rehab Diagnosis: Seizures (CMS/HCC) [R56.9]      Past Medical History:   Medical History[1]      Behavioral Observations and Mental Status:  The patient was seen for a psychological session in the patient's room for adjustment to medical situation and rehabilitation hospitalization in accordance with the attending physician's initial plan of care. The patient has consented to psychology/neuropsychology services. The patient was found in her room and her husband was present. She was alert and cooperative. Ms. Ishee was oriented to person, place, time, date and situation. Affect was WNL, and she made appropriate eye contact. She was engaging throughout the session and demonstrated spontaneous speech. Personal grooming and hygiene were good. Speech was of normal rhythm, volume, rate, and prosody.  Processing speed was within normal limits. Behavioral signs of pain were not observed. There was no evidence of her responding to internal stimuli, and there was no delusional content in her speech. At the time of the session, the patient did not appear to be in or report any acute distress. Thought process was linear and goal oriented; content was appropriate. Patient did not endorse any suicidal ideation, intent, and/or plan.    Assessment  Impressions: During this session, the patient and her husband were given space to process hospital and recovery related stressors. This included prognosis and realistic expectations. Stress management was also discussed in depth. Personal values and adaptive goals were integrated into adjustment and coping. A person centered and ACT approach was utilized.    Plan  Facilitate adaptive coping and adjustment to medical symptoms and functional mobility.           [1]   Past  Medical History:  Diagnosis Date    Abnormal vision     glasses    Arrhythmia 2018    SVT (had ablation)    Arthritis     Complication of anesthesia     urinary retention with every joint replacement, BP drop with spinal X1, another time spinal didn't work    Convulsions (CMS/HCC)     epilepsy, last seizure 2010    Deep venous thrombosis of distal lower extremity (CMS/HCC) 2018    after cholecystectomy; left leg    Diverticulitis     takes miralax     Encounter for blood transfusion 2018    Gastric ulceration 2018    GI bleed with requiring 4 units PRBCs per pt    Hyperlipidemia     Hypothyroidism     

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
## 2024-03-09 NOTE — OT Progress Note (Signed)
 Occupational Therapy  Inpatient Rehabilitation Daily Progress Note    Patient Name:  Jennifer [name]       Medical Record Number: [id number]   Date of Birth: 11/18/1955  Sex: Female          Room/Bed:  M555/M555.01    Therapy Received:  Start Time: 1300   Stop Time: 1400  Total Therapy Minutes: 60    Rehabilitation Precautions/Restrictions:  Weight Bearing Status: RUE non weight bearing  Other Precautions: fall, seizure, sling at all times, cognitive impairments, shoulder replacmenet precautions - see surgeons guide in pt's binder    Rehab Diagnosis: Seizures (CMS/HCC) [R56.9]     Subjective   Patient Report: Herminia I just want to use the one at home re: shower chair   Patient/Caregiver Goals: Stop falling.  Pain Assessment:      Objective   Vitals:        Interventions: Direct handoff from nsg. Pt found in w/c + sling, husband present, agreeable to therapy. Pt educated in proper donning of sling to ensure precaution following. Dep transition to 5B gym for ease of session.     Pt and husband requesting to discuss DME options for shower, pt reporting walk in shower with ledge to clear with suction grab bars. OT educating on the safety issues with suction grab bars ans how shower bench could be a safer alternative. OT educated on TTB, shower chair and shower bench c pt and husband stating concerns for fitting in with current shower set-up glass doors. OT educated on possibility of removing curtains in shower. Pt stating preference for shower bench at home.    Pt reported need for BR, transition back to room husband leaving session after gym. Pt completed and toilet hygiene in room. Pt required mod verbal/tactile  cueing for adherence to precaution following. See care tools below for details.  Left in room all needs met. Handoff to nsg.      Education Documentation  Precautions, taught by Joshua Bread at 03/09/2024  4:21 PM.  Learner: Significant Other, Patient  Readiness: Acceptance  Method: Explanation  Response:  Verbalizes Understanding, Needs Reinforcement    Home management activities, taught by Joshua Bread at 03/09/2024  4:21 PM.  Learner: Significant Other, Patient  Readiness: Acceptance  Method: Explanation  Response: Verbalizes Understanding, Needs Reinforcement    Equipment, taught by Joshua Bread at 03/09/2024  4:21 PM.  Learner: Significant Other, Patient  Readiness: Acceptance  Method: Explanation  Response: Verbalizes Understanding, Needs Reinforcement    Functional transfers/mobility, taught by Joshua Bread at 03/09/2024  4:21 PM.  Learner: Significant Other, Patient  Readiness: Acceptance  Method: Explanation  Response: Verbalizes Understanding, Needs Reinforcement    Fall prevention/balance training, taught by Joshua Bread at 03/09/2024  4:21 PM.  Learner: Significant Other, Patient  Readiness: Acceptance  Method: Explanation  Response: Verbalizes Understanding, Needs Reinforcement    ADL retraining, taught by Joshua Bread at 03/09/2024  4:21 PM.  Learner: Significant Other, Patient  Readiness: Acceptance  Method: Explanation  Response: Verbalizes Understanding, Needs Reinforcement    Education Comments  No comments found.        Assessment & Plan   Pt tolerated 60 min OT session well. Pt benefited from education on DME to increase understanding of safety awareness and precaution following in home. Pt would benefit from further OT intervention targeting ADL retraining and DME, but recommending use of TTB to maximize safety and prevent fall risk. Continue c POC.     Plan:  Risks/Benefits/POC Discussed with Pt/Family: With patient  Treatment Interventions: ADL retraining, Functional transfer training, Endurance training, Patient/Family training, Equipment eval/education, Compensatory technique education    Recommendation:  Discharge Recommendation: Home with home health OT  OT Estimated Length of Stay: 7-10 days

## 2024-03-09 NOTE — Progress Notes (Signed)
 PHYSICAL MEDICINE AND REHABILITATION  PROGRESS NOTE -- FACE-TO-FACE ENCOUNTER    Date Time: 03/09/24 7:00 PM  Patient Name: Jennifer Jennifer [name], Jennifer Jennifer [name]    Admission date:  03/03/2024  (LOS: 6 days)    Subjective:     Patient noticed decrease in shoulder pain with medication, walking better in therapy.  Patient with no complaint chest pain or abdominal pain.  Patient denies fever and chills,, had BM today , patient with minimal dysuria.    Functional Status:     Self Care Functional Status:    Current Status Current Status Discharge Goal   Functional Area: Care Score: Comments:     Eating 3   Independent   Oral Hygiene 5 able to hold toothbrush in R hand and place toothpaste on c L, bru Independent   Toileting Hygiene 2 total Jennifer [name] for pericare- able to assist c pants managment on L side Independent   Shower/Bathe Self 3 assist to wash and dry LUE, buttocks Independent   Upper Body Dressing 3 assist to orient shirt and max cues to thread up R arm, able to don over head and desend over torso, assist to don/doff brace Independent   Lower Body Dressing 3 assist to thread BLE and don up legs, able to assist c L hand and OT assisted with donning over R hip in standing Independent   Putting On/Taking Off Footwear 3 assist to don socks using sock aide with cues to orientation and use and to maintan percautions Independent      Mobility Functional Status:    Current   Status Current Status Discharge Goal   Functional Area: Care Score: Comments:     Toilet Transfer 4 CGA to completed SPT to [facility] Independent   Picking Up Object 4 CGA to reach c L arm Independent     Medications:   Medication reviewed by me:     Scheduled Meds: PRN Meds:   aspirin  EC, 325 mg, Oral, Daily  clonazePAM , 1 mg, Oral, Daily  docusate sodium , 100 mg, Oral, Daily  gabapentin , 200 mg, Oral, Q8H  heparin (porcine), 5,000 Units, Subcutaneous, Q8H SCH  lacosamide , 200 mg, Oral, BID  levETIRAcetam , 1,500 mg, Oral, Q12H SCH  levothyroxine , 100 mcg, Oral, Daily at  0600  metoprolol  succinate XL, 25 mg, Oral, Daily  pantoprazole, 40 mg, Oral, QAM AC  polyethylene glycol, 17 g, Oral, Daily  rosuvastatin , 5 mg, Oral, QHS  triamterene -hydrochlorothiazide , 1 tablet, Oral, Daily  vitamin D  (ergocalciferol ), 50,000 Units, Oral, Weekly        Continuous Infusions:   lactulose, 10 g, Daily PRN  naloxone , 0.4 mg, PRN  ondansetron , 4 mg, Q8H PRN   Or  ondansetron , 4 mg, Q8H PRN  oxyCODONE , 10 mg, Q4H PRN  oxyCODONE , 5 mg, Q4H PRN  polyethylene glycol, 17 g, Daily PRN            Medication Review  Jennifer [name] complete drug regimen review was completed: Yes  Were any drug issues found during review?: No       Review of Systems:   Jennifer [name] comprehensive review of systems was: No fevers, chills, nausea, vomiting, chest pain, shortness of breath, cough, headache, double vision.  All others negative.    Physical Exam:     Vitals:    03/09/24 0806 03/09/24 0808 03/09/24 0936 03/09/24 1613   BP: 103/43 109/62 96/60 117/61   Pulse: 68 74 65 71   Resp: 18      Temp: 98.2 F (36.8 C)   98.6 F (  37 C)   TempSrc: Oral   Oral   SpO2: 99%  95% 100%   Weight:       Height:           Intake and Output Summary (Last 24 hours) at Date Time    Intake/Output Summary (Last 24 hours) at 03/09/2024 1900  Last data filed at 03/09/2024 1720  Gross per 24 hour   Intake 1317 ml   Output 0 ml   Net 1317 ml     P.O.: 237 mL (03/09/24 1720)     Urine: 0 mL (03/09/24 0600)  Bladder Scan Volume (mL): 105 mL (03/06/24 1600)  Intermittent/Straight Cath (mL): 500 mL (03/04/24 0600)     Alert and NAD  Cardiac: regular rate and rhythm  Chest / Lungs:  Clear to auscultation.  Abdomen:  + bowel sounds, Soft, non-tender, non-distended.  Extremities: no calf tenderness. No pitting edema of BLE.    Labs:   No results for input(s): GLUCOSEWHOLE in the last 24 hours.    Recent Labs   Lab 03/09/24  0648 03/06/24  0610 03/04/24  0525   WBC 7.30 6.79 6.97   Hemoglobin 9.6* 10.2* 10.5*   Hematocrit 29.6* 31.3* 32.3*   Platelet Count 336 357* 352*         Recent Labs   Lab 03/04/24  0525   Sodium 138   Potassium 4.1   Chloride 102   CO2 27   BUN 15   Creatinine 0.7   Calcium  9.0   Albumin  2.9*   Protein, Total 5.7*   Bilirubin, Total 0.5   Alkaline Phosphatase 57   ALT 14   AST (SGOT) 16   Glucose 97             Results for orders placed or performed during the hospital encounter of 03/03/24 (from the past 24 hours)   CBC with Differential (Component)    Collection Time: 03/09/24  6:48 AM   Result Value    WBC 7.30    Hemoglobin 9.6 (L)    Hematocrit 29.6 (L)    Platelet Count 336    MPV 10.5    RBC 3.05 (L)    MCV 97.0 (H)    MCH 31.5    MCHC 32.4    RDW 13    nRBC % 0.0    Absolute nRBC 0.00    Preliminary Absolute Neutrophil Count 5.10    Neutrophils % 69.9    Lymphocytes % 18.5    Monocytes % 6.4    Eosinophils % 4.4    Basophils % 0.4    Immature Granulocytes % 0.4    Absolute Neutrophils 5.10    Absolute Lymphocytes 1.35    Absolute Monocytes 0.47    Absolute Eosinophils 0.32    Absolute Basophils 0.03    Absolute Immature Granulocytes 0.03            Rads:   Radiological Procedure reviewed.  No results found.        Assessment and Plan:     68 y/o female with PMH of epilepsy, HLD, hypothyroidism, DVT, s/p IVD filter, migraines, SVT, iron deficiency anemia, knee and hip replacements, colon resection, lumbar laminectomy, adverse anesthesia effects,  s/p fall after accidental OD on her meds, she had recent R shoulder replacement on 02/26/24.     #Rehab  - PT, OT with ADL, mobility, and ambulation with AD  - TR  - Fall, seizures, NWB RUE, and sling  -We  had Jennifer [name] team conference today  led by me and concur with team findings and recommendations. Please refer to the note for details.   10/10: IPOC completed today .     #Ataxia, accidental OD with meds  #s/p fall  -Monitor, esp with use of pain meds  -Therapy with gait training, stability  -Per neurology, ataxia attributed to medication OD     #H/o seizure d/o  -Poss seizures  -CT, MRI of the brain with no acute  abn  -Continue with seizure meds: Keppra , Lacosamide ,   -On low dose of clonazepam   -Seizure precaution  -Neurology f/u  10/10: Per medicine, decrease gabapentin , after discussing with patient's husband who is Jennifer [name] pharmacist, request reducing dosage, neurology consulted per medicine.     #S/p R shoulder replacement  -NWB RUE, on sling  -f/u with Dr. Barnabas  -shoulder x ray with postop changes, no displaced or fx  -Pt has f/u with orthopedic NP this Friday for postop f/u.  10/9: Per patient's husband, rescheduled for surgery follow-up with Dr. Brendon on 03/10/24 for postop follow-up.  10/13: LOA tomorrow for f/u with surgeon for postop f/u.     #HLD  - On Crestor      #Hypothyroidism  -On synthroid      #H/o iron def anemia  -CBC in am  -Est baseline Hgb 12.0 g/dl  -H/H 89.4/67.6 today , interm f/u. Postop anemia.  10/9: F/u CBC in am.  10/10: H/H 10.2/31.3 today , f/u CBC Monday.  10/13: H/H 9.6/29.6 today , f/u in 2 days to monitor anemia.     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 No leukocytosis.     #Dispo  -Plan for d/c on 03/13/24 with HH f/u.       Problem List[1]    Continue comprehensive and intensive inpatient rehab program, including:   Physical therapy 60-120 min daily, 5-6 times per week, Occupational therapy 60-120 min daily, 5-6 times per week, TR,Case management and Rehabilitation nursing                   Acute blood loss anemia requiring monitoring     Highest Value Lowest Value Decrease   Hemoglobin 13.2 g/dl  0/75/7974  8:71 PM 9.6 g/dl  89/86/7974  3:51 AM -3.6 g/dl   Hematocrit 59%  0/75/7974  1:28 PM  29.6%  03/09/2024  6:48 AM -10.4%           Signed by: Ozell GORMAN Lawyer, MD MD    [facility]    If there are questions or concerns about the content of this note or information contained within the body of  this dictation they should be addressed directly with the author for clarification.                   [1]   Patient Active Problem List  Diagnosis    Primary osteoarthritis of left knee    Mass of right breast, unspecified quadrant    Ventricular tachyarrhythmia (CMS/HCC)    Hypertriglyceridemia    Hypothyroidism, unspecified type    DDD (degenerative disc disease), lumbosacral    Prediabetes    Class 3 severe obesity with serious comorbidity and body mass index (BMI) of 40.0 to 44.9 in  adult, unspecified obesity type (CMS/HCC)    Seizure disorder (CMS/HCC)    SVT (supraventricular tachycardia)    History of colon resection    History of cholecystectomy    History of maternal deep vein thrombosis (DVT)    Morbid obesity with BMI of 45.0-49.9, adult (CMS/HCC)    Morbid obesity (CMS/HCC)    Seizures (CMS/HCC)

## 2024-03-09 NOTE — PT Progress Note (Addendum)
 Physical Therapy  Inpatient Rehabilitation Weekly Progress Note    Patient Name:  Jennifer [name]       Medical Record Number: [id number]   Date of Birth: 29-Jun-1955  Sex: Female          Room/Bed:  M555/M555.01    Therapy Received:  Start Time: 1400   Stop Time: 1500  Total Therapy Minutes: 60    Rehabilitation Precautions/Restrictions:  Weight Bearing Status: RUE non weight bearing  Other Precautions: fall, seizure, sling at all times, cognitive impairments, shoulder replacmenet precautions - see surgeons guide in pt's binder    Rehab Diagnosis: Seizures (CMS/HCC) [R56.9]     Subjective   Patient Report: Wow this shoulder hurts  Patient/Caregiver Goals: Stop falling.  Pain Assessment:  Pain Assessment: Numeric Scale (0-10)  Pain Score: 8-severe pain    Objective   Vitals:        Interventions:     Handoff from RN who reports pt is appropriate for therapy. Pt received sitting up in w/c. Agreeable to therapy. Pt dependently taken to [facility] gym. Assessed ambulation and transfers via CARE tool see below.     Instructed on seated LE exercises for gross strengthening in NWB position due to high levels of shoulder pain. 2x10 ea: LAQ with 3 sec isometric hold and marches     Pt dependently taken back to room. Requested to use bathroom - stand pivot transfer to toilet with SPV and use of rails. PT dependently completed posterior pericare. Transfer back to bed as outlines below. Left with all immediate needs met.       Mobility Functional Status:   Current   Status Current Status Discharge Goal   Functional Area: Care Score:  Comments:    Roll Left and Right 88 Did not assess due to NWB on R side and pain in shoulder Independent (To L, will not roll to R due ot precautions)   Sit to Lying 4 Extended time, flat bed and no rails. VC for seqencing LE Independent   Lying to Sitting on Side of Bed 3 Mod A at trunk Independent   Sit to Stand 4 SPV to hemiwalker Independent   Chair/Bed-to-Chair Transfer 4 CGA stands pivot HW  Independent   Car Transfer 3 Simulated Independent   Walk 10 Feet 4 HW Independent   Walk 50 Feet with Two Turns 4 HW Independent   Walk 10 Feet on Uneven Surface 4 CGA for steady support HW Independent   Walk 150 Feet 4 CGA for steady support HW Independent   1 Step (Curb) 88 Unable to complete due to increase in R shoulder pain Independent   4 Steps 4 CGA with single HR per typical performance Independent   12 Steps 88 Rest break required after 4 steps due to fatigue Supervision or touching assistance   Wheel 50 Feet with Two Turns        Wheel 150 Feet            Encounter Problems       Encounter Problems (Active)       PT - General and Outcome Measures       LTG: Patient will complete supine to/from sit transfer independently to maximize independence with bed mobility/decrease burden of care (Progressing)       Start:  03/04/24            LTG: Patient will complete independently to maximize independence and prevent falls. (Progressing)       Start:  03/04/24            LTG: Patient will walk 50 ft independently with LRAD to access areas of their home and community/decrease burden of care (Progressing)       Start:  03/04/24            LTG: Patient will ascend/descend 12 stairs with single HR and supervision to safely and independently access their home/community (Progressing)       Start:  03/04/24            LTG: Patient will ascend/descend 2 stairs with no HR and SPV to safely and independently access their home/community (Progressing)       Start:  03/04/24            LTG: Patient will improve score on Berg Balance Assessment by 7 points in order to demonstrate decrease risk for falls as compared to initial Berg Balance Assessment score (Progressing)       Start:  03/04/24                   Education Documentation  Wheelchair management/mobility, taught by Fernando Quarry, PT at 03/09/2024  2:00 PM.  Learner: Patient  Readiness: Acceptance  Method: Explanation, Demonstration  Response: [place]  Understanding, Demonstrated Understanding    Safety issues and interventions, taught by Fernando Quarry, PT at 03/09/2024  2:00 PM.  Learner: Patient  Readiness: Acceptance  Method: Explanation, Demonstration  Response: [place]rell Understanding, Demonstrated Understanding    Rehab techniques/procedure, taught by Fernando Quarry, PT at 03/09/2024  2:00 PM.  Learner: Patient  Readiness: Acceptance  Method: Explanation, Demonstration  Response: Verbalizes Understanding, Demonstrated Understanding    Orthotics/prosthetics, taught by Fernando Quarry, PT at 03/09/2024  2:00 PM.  Learner: Patient  Readiness: Acceptance  Method: Explanation, Demonstration  Response: Verbalizes Understanding, Demonstrated Understanding    Home management activities, taught by Fernando Quarry, PT at 03/09/2024  2:00 PM.  Learner: Patient  Readiness: Acceptance  Method: Explanation, Demonstration  Response: Verbalizes Understanding, Demonstrated Understanding    Plan of care, taught by Fernando Quarry, PT at 03/09/2024  2:00 PM.  Learner: Patient  Readiness: Acceptance  Method: Explanation, Demonstration  Response: Verbalizes Understanding, Demonstrated Understanding    Functional transfers/mobility, taught by Fernando Quarry, PT at 03/09/2024  2:00 PM.  Learner: Patient  Readiness: Acceptance  Method: Explanation, Demonstration  Response: Verbalizes Understanding, Demonstrated Understanding    Fall prevention/balance training, taught by Fernando Quarry, PT at 03/09/2024  2:00 PM.  Learner: Patient  Readiness: Acceptance  Method: Explanation, Demonstration  Response: Verbalizes Understanding, Demonstrated Understanding    Education Comments  No comments found.        Assessment & Plan     Pt demonstrating good progress toward functional mobility as demonstrated by decreased assistance required for transfers and tolerating increased in ambulation distance. Pt's main barriers to d/c remain high levels of pain, inconsistent levels of assist for  bed mobility, balance deficits, and ROM limitations in R shoulder due to TSA. Pt will continue to benefit from skilled physical therapy to address above impairments to increase independence and decrease burden of care.       Plan:  Risks/Benefits/POC Discussed with Pt/Family: With patient/family  Treatment/Interventions: Exercise, Gait training, Stair training, Functional transfer training, LE strengthening/ROM, Endurance training, Cognitive reorientation, Patient/family training, Compensatory technique education    Recommendation:  Discharge Recommendation: Home with supervision, Home with home health PT  PT Therapy Recommendation: 5-6 days/week, 60-120 mins/day, 1:1 treatment, Group therapy  PT Estimated Length  of Stay: 7-10 days from eval 10/8    DME: Hemiwalker vs quad cane    Groups appropriate for patient:   Other (not GG appropriate)

## 2024-03-09 NOTE — Discharge Instr - AVS First Page (Addendum)
 Reason for your Hospital Admission:  ***      Instructions for after your discharge:  ***            Home Health Discharge Information     Your doctor has ordered Skilled Nursing, Physical Therapy, and Occupational Therapy in-home service(s) for you while you recuperate at home, to assist you in the transition from hospital to home.     The agency that you or your representative chose to provide the service:  Name of Home Health Agency Placement: [facility]]  Phone: [phone]      The above services were set up by:  Renne Casa, RN (Post Acute Care Coordinator)   Phone: [phone]        IF YOU HAVE NOT HEARD FROM YOUR HOME HEALTH AGENCY WITHIN 24-48 HOURS AFTER DISCHARGE PLEASE CALL YOUR AGENCY TO ARRANGE A TIME FOR YOUR FIRST VISIT. FOR ANY SCHEDULING CONCERNS OR QUESTIONS RELATED TO HOME HEALTH, SUCH AS TIME OR DATE PLEASE CONTACT YOUR HOME HEALTH AGENCY AT THE NUMBER LISTED ABOVE.

## 2024-03-09 NOTE — Progress Notes (Addendum)
 Case Management Progress Note:  SW spoke with patient's spouse to schedule FT and FM .  Patient's spouse is available for FT 10/15 9-11AM and will be available 10/15 1PM for FM.      SW will continue to follow for support and discharge planning.     Montie Levy, LMSW, ACMA-SW  Social Work Case Manager  [place] [facility]   Phone: [phone]  Fax: [phone]  [email or web address]

## 2024-03-09 NOTE — Plan of Care (Signed)
 Problem: Pain Management  Goal: LTG: Patient will verbalize pain relief less than 2/10 with the use of pain medication and nonpharmacological pain measures such as deep breathing, relaxation techniques and repositioning 100% of the time  Outcome: Progressing  Note: PRN pain medication given once during the shift with possible result .     Problem: Skin Wound Management  Goal: LTG: Patient will show no evidence of skin breakdown during hospitalization.  Outcome: Progressing     Problem: Risk of Infection  Goal: LTG: Patient incisions and abrasions will be free of any signs or symptoms of infection and will advance to healed by at least 50% by discharge date.  Outcome: Progressing     Problem: Safety Risk  Goal: LTG: Patient will acknowledge limitations and call for assistance before transferring in and out of bed and with ambulation 100% of the tim  Outcome: Progressing

## 2024-03-09 NOTE — Consults (Signed)
 Start [facility] Note  Home Health Referral    Referral from Montie NOVAK (Case Manager) for home health care upon discharge.    By [employer], the patient has the right to freely choose a home care provider.    A company of the patients choosing. We have supplied the patient with a listing of providers in your area who asked to be included and participate in Medicare.   [facility] a home care agency that provides adult home care services and participates in Medicare   The preferred provider of your insurance company. Choosing a home care provider other than your insurance company's preferred provider may affect your insurance coverage.      Home Health Discharge Information    Your doctor has ordered Skilled Nursing, Physical Therapy, and Occupational Therapy in-home service(s) for you while you recuperate at home, to assist you in the transition from hospital to home.    The agency that you or your representative chose to provide the service:  Name of Home Health Agency Placement: [facility]]  Phone: [phone]        The above services were set up by:  Equilla Olden, RN (Post Acute Care Coordinator)   Phone: [phone]      IF YOU HAVE NOT HEARD FROM YOUR HOME HEALTH AGENCY WITHIN 24-48 HOURS AFTER DISCHARGE PLEASE CALL YOUR AGENCY TO ARRANGE A TIME FOR YOUR FIRST VISIT. FOR ANY SCHEDULING CONCERNS OR QUESTIONS RELATED TO HOME HEALTH, SUCH AS TIME OR DATE PLEASE CONTACT YOUR HOME HEALTH AGENCY AT THE NUMBER LISTED ABOVE.    Additional comments:        START PATIENT REGISTRATION INFORMATION     Order Information  Order Signing Physician: Rea Ozell RAMAN, MD    Service Ordered RN ?: Yes  Service Ordered PT ?: Yes  Service Ordered OT ?: Yes  Service Ordered ST ?: No    Service Ordered MSW?: No    Service Ordered HHA?: No    Following Physician: Sallyann Dennison DEL, MD   Following Physician Phone: [phone]   Overseeing Physician: N/A  (Required for Residents only)   Agreeable to  Follow?: N/A  Spoke with: N/A  Date/Time of Call: 03/09/24 6:27 AM      Care Coordination   SOC Call from [facility] Required?: no  Same Day [facility]?: no  Primary Care Physician:Nghi DEL Sallyann, MD  Primary Care Physician [phone]  Primary Care Physician Address: [address] / [place] [address]  PCP NPI: [id number]  Visit Instructions: N/A  Service Discharge Location Type: Home  Service Facility Name: N/A  Service Floor Facility: N/A  Service Room No: N/A    Demographics  Patient Last Name: Arzola   Patient First Name: Jennifer [name]  Language/Communication Barrier: no  Service Address: [address]  [place] [address]   Service Home Phone: [phone] (home)   Other phone numbers:    Telephone Information:   Mobile [phone]     Emergency Contact: Extended Emergency Contact Information  Primary Emergency Contact: Uhde,Dale  Address: [address]           [place], [place] [address] [place]  Home Phone: [phone]  Mobile Phone: [phone]  Relation: Spouse  Preferred language: English  Interpreter needed? No    Admission Information  Admit Date: 03/03/2024  Patient Status at discharge: Inpatient  Admitting Diagnosis: Seizures (CMS/HCC) [R56.9]     Caregiver Information  Caregiver First Name: N/A  Caregiver Last Name:  N/A  Caregiver Relationship to Patient: Other  Caregiver Phone Number: N/A  Caregiver Notes: N/A            Data processing manager Information  Primary Subscriber:   Primary Subscriber Relation To Guarantor:   Primary Payor:   Primary Plan:   Primary Group #:    Primary Subscriber ID:    Estate agent DOB:   Secondary Insurance Information  Secondary Subscriber:   Secondary Subscriber Relation To Guarantor:   Secondary Payor:   Secondary Plan:   Secondary Group #:   Secondary Subscriber ID:   Secondary Subscriber DOB:   HITECH  NO      END PATIENT REGISTRATION INFORMATION       Diagnosis: Seizures (CMS/HCC) [R56.9]    Start [facility]  Summary        Additional Comments:        Home Health face-to-face (FTF) Encounter (Order [id number])  Consult  Date: 03/05/2024 Department: [facility] Ordering/Authorizing: Rea Ozell RAMAN, MD     Order Information    Order Date/Time Release Date/Time Start Date/Time End Date/Time   03/05/24 02:58 PM None 03/05/24 02:30 PM 03/05/24 02:30 PM     Order Details    Frequency Duration Priority Order Class   Once 1  occurrence Routine Hospital Performed     Standing Order Information    Remaining Occurrences Interval Last Released     0/1 Once 03/05/2024              Provider Information    Ordering User Ordering Provider Authorizing Provider   Coleman Showers, RN Rea Ozell RAMAN, MD Rea Ozell RAMAN, MD   Attending Provider(s) Admitting Provider PCP   Rea Ozell RAMAN, MD Rea Ozell RAMAN, MD Sallyann Dennison DEL, MD     Verbal Order Info    Action Created on Order Mode Entered by Responsible Provider Signed by Signed on   Ordering 03/05/24 1458 Per protocol: cosign required Coleman Showers, RN Rea Ozell RAMAN, MD Rea Ozell RAMAN, MD 03/05/24 1613           Comments       Primary Diagnosis:  Seizures (CMS/HCC) [R56.9]  #Ataxia  #s/p falL  #S/p R shoulder replacement      Following Physician:  Sallyann Dennison DEL, MD  Address: [address] / [place] [address]  Phone: [phone]    Fax: [phone]                Home Health face-to-face (FTF) Encounter: Patient Communication     Not Released  Not seen         Order Questions    Question Answer   Date I saw the patient face-to-face: 03/05/2024   Evidence this patient is homebound because: B.  Profound weakness, poor balance/unsteady gait d/t illness/treatment/procedure    C.  Decreased endurance, strength, ROM, cadence, safety/judgment during mobility    I.  Restricted to home to decrease risk of infection    G.  Fall risk due to impaired coordination, gait and decreased balance   Medical conditions that necessitate Home Health  care: B.  Functional impairment due to recent hospitalization/procedure/treatment    C.  Risk for complication/infection/pain requiring follow up and monitoring    F.  New diagnosis & treatment requiring follow up monitoring and management    E.  Exacerbation of disease requiring follow up monitoring    G.  High risk/complex medications requiring instruction  and management    H.  Multiple new medications requiring management and monitoring   Per clinical findings, following services are medically necessary: Skilled Nursing    PT    OT   Clinical findings that support the need for Skilled Nursing. SN will: C. Monitor for signs and symptoms of exacerbation of disease and management    D. Review medication reconciliation, manage and educate on use and side effects    G. Educate on new diagnosis, treatment & management to prevent re-hospitalization    H. Assess cardiopulmonary status and monitor for signs &symptoms of exacerbation    I.  Educate dietary and or fluid restrictions and weight management   Clinical findings that support the need for Physical Therapy. PT will A.  Evaluate and treat functional impairment and improve mobility    C.  Educate on weight bearing status, stair/gait training, balance & coordination    F.  Perform home safety assessment & develop safe in home exercise program    E.  Educate on functional mobility; bed, chair, sit, stand and transfer activities    D.  Provide services to help restore function, mobility, and releive pain    G.  Implement activities to improve stance time, cadence & step length    H.  Educate on the safe use of assistive device/ durable medical equipment    I.  Instruct on restorative activities to restore ability to perform ADL   OT will provide assistance with: Home program to improve ability to perform ADLs    Recovery and maintenance skills    Basic motor function and reasoning abilities    Strategies to compensate for loss of function    Restorative program to  improve mobility and independence   Other (please specify) SN/PT/OT                    Process Instructions    Please select Home Care Services medically necessary.    Based on the above findings, I certify that this patient is confined to the home and needs intermittent skilled nursing care, physical therapry and / or speech therapy or continues to need occupational therapy. The patient is under my care, and I have initiated the establishment of the plan of care. This patient will be followed by a physician who will periodically review the plan of care.    Collection Information            Consult Order Info    ID Description Priority Start Date Start Time   [id number] Home Health face-to-face (FTF) Encounter Routine 03/05/2024  2:30 PM   Provider Specialty Referred to   ______________________________________ _____________________________________         Acknowledgement Info    For At Acknowledged By Acknowledged On   Placing Order 03/05/24 1458 Linda Rumpf, RN 03/05/24 1506                     Verbal Order Info    Action Created on Order Mode Entered by Responsible Provider Signed by Signed on   Ordering 03/05/24 1458 Per protocol: cosign required Coleman Showers, RN Rea Ozell RAMAN, MD Rea Ozell RAMAN, MD 03/05/24 1613           Patient Information    Patient Name  Laurian Jennifer [name] LABOR Legal Sex  Female DOB  02-Sep-1955       Reprint Order Requisition    Home Health face-to-face (FTF) Encounter (Order #[id number]) on 03/05/24  Additional Information    Associated Reports External References   Priority and Order Details [employer]       End [facility] Summary     Discharge Date:  03/13/24    Referral Source  Signed by: Equilla Olden, RN  Date Time: 03/09/24 6:27 AM      End [facility] Note

## 2024-03-09 NOTE — Plan of Care (Signed)
 Problem: Pain Management  Goal: LTG: Patient will verbalize pain relief less than 2/10 with the use of pain medication and nonpharmacological pain measures such as deep breathing, relaxation techniques and repositioning 100% of the time  Outcome: Progressing  Note: Continue on scheduled and PRN pain medications with helpful result. Able to tolerate all therapy sessions     Problem: Skin Wound Management  Goal: LTG: Patient will show no evidence of skin breakdown during hospitalization.  Outcome: Progressing  Note: Nursing staff continue to encourage TARP Q 2-3 hrs while in bed and offload weight Q 30 minutes while in her w/chair in order to relieve pressure so as to prevent further skin issues     Problem: Risk of Infection  Goal: LTG: Patient incisions and abrasions will be free of any signs or symptoms of infection and will advance to healed by at least 50% by discharge date.  Outcome: Progressing  Note: Post op dressing in place on Rt shoulder incision with no S/S of infection noted.     Problem: Safety Risk  Goal: LTG: Patient will acknowledge limitations and call for assistance before transferring in and out of bed and with ambulation 100% of the tim  Outcome: Progressing  Note: Patient is using her call light to call for staff assist prior to transferring in and out of bed and w/chair.

## 2024-03-09 NOTE — OT Progress Note (Addendum)
 Occupational Therapy  Inpatient Rehabilitation Weekly Progress Note    Patient Name:  Jennifer [name]       Medical Record Number: [id number]   Date of Birth: 1955-06-30  Sex: Female          Room/Bed:  M555/M555.01    Therapy Received:  Start Time: 58  Stop Time: 1000  Total Therapy Minutes: 80    Rehabilitation Precautions/Restrictions:  Weight Bearing Status: RUE non weight bearing  Other Precautions: fall, seizure, sling at all times, cognitive impairments, shoulder replacmenet precautions - see surgeons guide in pt's binder    Rehab Diagnosis: Seizures (CMS/HCC) [R56.9]     Subjective   Patient Report: I wan na wash my hair before the surgeon tomorrow  Patient/Caregiver Goals: Stop falling.  Pain Assessment:      Objective   Vitals:  Heart Rate: 65  BP: 96/60  BP Location: Left arm  MAP (mmHg): 72  Patient Position: Sitting  SpO2: 95 %  O2 Device: None (Room air)    Interventions: Handoff completed with RN, Pt cleared for therapy. Pt met in bed, agreeable to addition therapy to make up missed minutes as well as planned session.    Completed SPT to w/c CGA. Dep taken to bathroom. Session focused on promoting ind and safety c morning routine ADLs. OT assisted c washing hair. Pt benefited from frequent cues throughout session to maintain NWB precautions and ROM restrictions for R arm.  Set up Pt at sink to complete grooming ADLs, transitioned to toilet (+BM). See below for CLOF.    Rest of session focused on AE training to promote ind c LB dressing and footwear. Pt trailed using sock aid and reacher c extended time and max cues for problem solving and to maintain precautions. Pt also reporting difficulty c eating- trailed using built up and weighted handled utensils as well as rocker knife- pt able to cut putty using L hand. Provided Pt c handouts to self purchase utensils, hip kit, rocker knife, and tray table.       Pt left in chair c alarm on. Handoff to RN, all needs met.        Self Care Functional  Status:   Current Status Current Status Discharge Goal   Functional Area: Care Score: Comments:    Eating 3  Independent   Oral Hygiene 5 able to hold toothbrush in R hand and place toothpaste on c L, bru Independent   Toileting Hygiene 2 total A for pericare- able to assist c pants managment on L side Independent   Shower/Bathe Self 3 assist to wash and dry LUE, buttocks Independent   Upper Body Dressing 3 assist to orient shirt and max cues to thread up R arm, able to don over head and desend over torso, assist to don/doff brace Independent   Lower Body Dressing 3 assist to thread BLE and don up legs, able to assist c L hand and OT assisted with donning over R hip in standing Independent   Putting On/Taking Off Footwear 3 assist to don socks using sock aide with cues to orientation and use and to maintan percautions Independent     Mobility Functional Status:   Current   Status Current Status Discharge Goal   Functional Area: Care Score: Comments:    Toilet Transfer 4 CGA to completed SPT to [facility] Independent   Picking Up Object 4 CGA to reach c L arm Independent         Encounter Problems  Encounter Problems (Active)       Dressing Lower Body       LTG: Patient will dress lower body with Mod I using AE prn in order to maximize independence with basic self care. (Progressing)       Start:  03/04/24            STG: Patient will dress lower body with min assistance using AE prn and min verbal cues in order to maximize independence with basic self care. (Progressing)       Start:  03/04/24            LTG: Pt will incorporate adaptive/compensatory dressing technique of lower extremities with min verbal cues to maximize safety with basic self care. (Progressing)       Start:  03/04/24               Dressing Upper Extremities       LTG: Patient will complete UB dressing with independence in order to maximize independence with basic self-care tasks. (Progressing)       Start:  03/04/24            STG: Patient will  complete UB dressing with min assistance and  min verbal cues in order to maximize independence with basic self-care tasks. (Progressing)       Start:  03/04/24               Eating       LTG: Pt will demonstrate use of compensatory strategies/AE in order to feed self with independence d/t affected dominant extremity.  (Progressing)       Start:  03/04/24            STG: Pt will demonstrate use of compensatory strategies/AE in order to feed self with min assistance and  min verbal cues. (Progressing)       Start:  03/04/24               IADLs       LTG: Pt will utilize energy conservation strategies during IADL tasks with no cues in order to maximize safety and reduce falls in preparation for a safe discharge. (Progressing)       Start:  03/04/24            STG: Pt will utilize energy conservation strategies during IADL tasks with min verbal cues in order to maximize safety and reduce falls in preparation for a safe discharge. (Progressing)       Start:  03/04/24               Toilet Hygiene       LTG: Patient will perform toilet hygiene with independence using recommended equipment for safety. (Progressing)       Start:  03/04/24            STG: Patient will perform toilet hygiene with min assistance and min verbal cues using recommended equipment for safety. (Progressing)       Start:  03/04/24                   Education Documentation  Safety issues and interventions, taught by Savana Spina, OT at 03/09/2024  3:41 PM.  Learner: Patient  Readiness: Acceptance  Method: Explanation, Teach Back  Response: Merrell Understanding, Demonstrated Understanding, Needs Reinforcement    Rehab techniques/procedure, taught by Graceland Wachter, OT at 03/09/2024  3:41 PM.  Learner: Patient  Readiness: Acceptance  Method: Explanation, Teach Back  Response: Verbalizes Understanding,  Demonstrated Understanding, Needs Reinforcement    Precautions, taught by Aariya Ferrick, OT at 03/09/2024  3:41 PM.  Learner:  Patient  Readiness: Acceptance  Method: Explanation, Teach Back  Response: Verbalizes Understanding, Demonstrated Understanding, Needs Reinforcement    Plan of care, taught by Joene Gelder, OT at 03/09/2024  3:41 PM.  Learner: Patient  Readiness: Acceptance  Method: Explanation, Teach Back  Response: Verbalizes Understanding, Demonstrated Understanding, Needs Reinforcement    Fall prevention/balance training, taught by Tasheka Houseman, OT at 03/09/2024  3:41 PM.  Learner: Patient  Readiness: Acceptance  Method: Explanation, Teach Back  Response: Verbalizes Understanding, Demonstrated Understanding, Needs Reinforcement    ADL retraining, taught by Jackson Penner, OT at 03/09/2024  3:41 PM.  Learner: Patient  Readiness: Acceptance  Method: Explanation, Teach Back  Response: Verbalizes Understanding, Demonstrated Understanding, Needs Reinforcement    Education Comments  No comments found.        Assessment & Plan   Patient has made good progress with OT POC, and currently performs all ADL's at a maxA- set up assist level. Pt continue to progress toward all goals in OT POC. Patient still limited by ROM and NWB precautions, pain, decreased endurance, and decreased balance. Pt will continue to benefit from skilled OT services within an interdisciplinary team approach to maximize safety and improve independence prior to discharge.        Plan:  Risks/Benefits/POC Discussed with Pt/Family: With patient  Treatment Interventions: ADL retraining, Functional transfer training, Endurance training, Patient/Family training, Equipment eval/education, Compensatory technique education    Recommendation:  Discharge Recommendation: Home with home health OT  OT Estimated Length of Stay: 7-10 days        Groups appropriate for patient:   Other (not group approriate)

## 2024-03-10 LAB — LAB USE ONLY - URINE GRAY CULTURE HOLD TUBE

## 2024-03-10 MED ORDER — SODIUM CHLORIDE 0.9 % IV BOLUS
250.0000 mL | Freq: Once | INTRAVENOUS | Status: DC
Start: 2024-03-10 — End: 2024-03-13
  Filled 2024-03-10: qty 250
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
## 2024-03-10 NOTE — Plan of Care (Signed)
 Geriatric Focused Assessment    Is the patient age 69 or older?Yes    Sleep:   Does the patient have an ineffective sleeping pattern? Yes    Problems with feeding:    Does the patient have an adequate appetite? Yes   Is the patient able to feed themself? Yes    Incontinence:   Is the patient incontient of bowel or bladder? No      Confusion:   Does the patient have any signs or symptoms of deliruim? No      Evidence of falls:   Is the patient a documented high fall risk? Yes    Skin Breakdown:   Does the patient have any areas of skin breakdown? No     Are there any risk factors? No          Problem: Pain Management  Goal: LTG: Patient will verbalize pain relief less than 2/10 with the use of pain medication and nonpharmacological pain measures such as deep breathing, relaxation techniques and repositioning 100% of the time  Outcome: Progressing  Note: PRN pain medications given twice during the shift and well tolerated .     Problem: Skin Wound Management  Goal: LTG: Patient will show no evidence of skin breakdown during hospitalization.  Outcome: Progressing  Note: Skin intact , no new skin breakdown , patient able to turn and reposition self while in bed .     Problem: Risk of Infection  Goal: LTG: Patient incisions and abrasions will be free of any signs or symptoms of infection and will advance to healed by at least 50% by discharge date.  Outcome: Progressing  Note: No signs of infection noted during the shift , Vital signs stable and dressing intact , dry and clean .      Problem: Safety Risk  Goal: LTG: Patient will acknowledge limitations and call for assistance before transferring in and out of bed and with ambulation 100% of the tim  Outcome: Progressing  Note: Patient called appropriately during the shift and all precautions in place .

## 2024-03-10 NOTE — PT Progress Note (Signed)
 Physical Therapy  Inpatient Rehabilitation Daily Progress Note    Patient Name:  Jennifer [name]       Medical Record Number: [id number]   Date of Birth: 02/14/1956  Sex: Female          Room/Bed:  M555/M555.01    Therapy Received:  Start Time: 1500  Stop Time: 1600  Total Therapy Minutes: 60    Rehabilitation Precautions/Restrictions:  Weight Bearing Status: RUE non weight bearing  Other Precautions: fall, seizure, sling at all times, cognitive impairments, shoulder replacmenet precautions - see surgeons guide in pt's binder    Rehab Diagnosis: Seizures (CMS/HCC) [R56.9]     Subjective   Patient Report: Pt reports she is happy she is progressing with her personal goals.   Patient/Caregiver Goals: Stop falling.  Pain Assessment:  Pain Assessment: Numeric Scale (0-10)  Wong-Baker FACES Pain Rating: Hurts whole lot (Pt initially says 10/10; decreases subjective once education for 10/10 pain provided.)  Pain Location: Shoulder  Pain Orientation: Right  Pain Intervention(s): Medication (See eMAR), Repositioned (Completed exercises from surgeon.)    Objective   Vitals:  Heart Rate: 60  BP: 103/61      Interventions:   Pt finishing in restroom after returning from MD appt, ready for therapy. Appreciate handoff from RN Asberry who clears pt for therapy.   Clin tech in room, assisting pt with restroom.   Pt transfers to [facility] with close SBA and use of grab bar with LUE.   Pt brought to [facility] gym.    Stair Negotiation  Pt instructed in stair negotiation for improved home mobility management.   Pt completes 4 x 3 stairs at 6 inch height, L hand rail ascending with SBA.  Cues provided for step-to gait pattern, leading with mix of RLE and LLE when ascending and LLE when descending.    Reviewed pt's personal goals from rehab binder, with pt reporting increased comfort in progression to goals:  Walking 30 feet without external assistance (with AD as needed)  Managing a flight of stairs  Managing in & out of bed    Therapeutic  Exercise  Pt completes exercises from surgeon to decrease pain and continue to maintain strength while decreasing overall stiffness:  Pendulums at elevated mat table, CCW and CW x 10 each direction (and external motion provided with cues for passive ROM)  AROM elbow flexion/extension  AROM wrist flexion/extension  Pt provided with handout for rotator cuff isometrics (per suggestion from surgeon's protocol), and brief demonstration provided as well as education on what isometric exercises were. Pt shown isometric shoulder IR, ER, FLEX, EXT, ABD.     Gait Training  Pt instructed in ambulation using HW with CGA to SBA and NO WC follow.  Pt completes 100' near RN station with multiple turns.  No LOB and improved technique compared to observation during previous session this writer treated pt (pt lifts feet off ground and has normal step length).    Patient Disposition: in room, in chair (In [facility] to have dinner.)  Safety Interventions: oriented to call bell and placed within reach, personal items within reach, assistive devices out of reach, handoff to next therapist, chair alarm acitvated, bed placed in lowest position    Education Documentation  Home management activities, taught by Rollene Laneta Browning, LPTA at 03/10/2024  3:00 PM.  Learner: Patient  Readiness: Acceptance  Method: Explanation, Teach Back  Response: Verbalizes Understanding    Home exercise program, taught by Rollene Laneta Browning, LPTA at 03/10/2024  3:00 PM.  Learner: Patient  Readiness: Acceptance  Method: Explanation, Teach Back  Response: Verbalizes Understanding    Education Comments  No comments found.         Assessment & Plan   Jennifer [name] is improving with balance for basic home mobility tasks, and managed a flight of stairs this session. Provided exercises she will be able to progress with (now that she is 2 weeks post op), which she will benefit from trialing in upcoming session(s). Continue plan of care as set by evaluating Physical  Therapist.     Plan:  Risks/Benefits/POC Discussed with Pt/Family: With patient/family  Treatment/Interventions: Exercise, Gait training, Stair training, Functional transfer training, LE strengthening/ROM, Endurance training, Cognitive reorientation, Patient/family training, Compensatory technique education    Recommendation:  Discharge Recommendation: Home with supervision, Home with home health PT  PT Therapy Recommendation: 5-6 days/week, 60-120 mins/day, 1:1 treatment, Group therapy  PT Estimated Length of Stay: 7-10 days from eval 10/8

## 2024-03-10 NOTE — Progress Notes (Signed)
 Case Management  Inpatient Rehabilitation Discharge Instructions    Discharge Date: 10/17.2025    Transportation: Car     Discharge Plan: Home with Home Health Services          HOME HEALTH REFERRAL:                                                                             Referral from Montie Levy  (Case Manager) for home health care upon discharge.     By [employer], the patient has the right to freely choose a home care provider.  Arrangements have been made with:     A company of the patients choosing. We have supplied the patient with a listing of providers in your area who asked to be included and participate in Medicare, or:  The preferred provider of your insurance company. Choosing a home care provider other than your insurance company's preferred provider may affect your insurance coverage.      Home Health Discharge Information     Your doctor has ordered Skilled Nursing, Physical Therapy, and Occupational Therapy in-home service(s) for you while you recuperate at home, to assist you in the transition from hospital to home.     The agency that you or your representative chose to provide the service:  Name of Home Health Agency Placement:     [facility]  Phone: [phone]         The above services were set up by:  Equilla Olden, RN (Post Acute Care Coordinator)   Phone: [phone]            IF YOU HAVE NOT HEARD FROM YOUR HOME YOUR HOME HEALTH AGENCY WITHIN 24-48 HOURS AFTER DISCHARGE PLEASE CALL YOUR AGENCY TO ARRANGE A TIME FOR YOUR FIRST VISIT. FOR ANY SCHEDULING CONCERNS OR QUESTIONS RELATED TO HOME HEALTH, SUCH AS TIME OR DATE PLEASE CONTACT YOUR HOME HEALTH AGENCY AT THE NUMBER LISTED ABOVE.         Follow-up Appointment(s):       Nghi Bui,MD, Speciality-General Internal Medicine  Call to schedule appointment to be seen in 7/10 days  [address]  [place], [place] [address]  Phone: [phone]    Emery Loach, MD-Neurology  Call to schedule appointment to be  seen in 2 weeks  [facility] [address]  [place], [place] [address]    Ozell LULLA Foot, MD-Orthopedic Surgery   Call physician to schedule appointment to be seen in 2 weeks  [address]   [place], [place] [address]   Phone: [phone]       Community Resources:Private Hire Aide info and Home Health FAQ [facility]    Additional Information: Please call the Rehab Unit with questions about your discharge medications at Phone: [phone]      Contact information for Acute Rehabilitation Case Manager has been provided in your rehab binder, for your reference.        Montie Levy, LMSW, ACMA-SW  Social Work Case Manager  [facility]   Phone: [phone]  Fax: [phone]  [email or web address]

## 2024-03-10 NOTE — Plan of Care (Signed)
 Problem: Pain Management  Goal: LTG: Patient will verbalize pain relief less than 2/10 with the use of pain medication and nonpharmacological pain measures such as deep breathing, relaxation techniques and repositioning 100% of the time  Outcome: Progressing: patient reports pain in her right shoulder post a follow-up appointment out of the facility followed by physical therapy after returning to the hospital. Medicated with oxycodone  10mg  po with good effect. Patient did go to her appointment and was able to complete her physical therapy session.     Problem: Safety Risk  Goal: LTG: Patient will acknowledge limitations and call for assistance before transferring in and out of bed and with ambulation 100% of the tim  Outcome: Progressing: Patient with good safety awareness. She effectively uses the call bell to call for assistance with transfers or other needs and demonstrates good body mechanics when transferring to the wheelchair or back to the bed.

## 2024-03-10 NOTE — PT Progress Note (Signed)
 Physical Therapy  Inpatient Rehabilitation Daily Progress Note    Patient Name:  Jennifer [name]       Medical Record Number: [id number]   Date of Birth: [birth year]  Sex: Female          Room/Bed:  M555/M555.01    Therapy Received:  Start Time: 1000   Stop Time: 1100  Total Therapy Minutes: 60    Rehabilitation Precautions/Restrictions:  Weight Bearing Status: RUE non weight bearing  Other Precautions: fall, seizure, sling at all times, cognitive impairments, shoulder replacmenet precautions - see surgeons guide in pt's binder    Rehab Diagnosis: Seizures (CMS/HCC) [R56.9]     Subjective   Patient Report: I am tired today   Patient/Caregiver Goals: Stop falling.  Pain Assessment:  Pain Assessment: Numeric Scale (0-10)  Pain Score: 5-moderate pain  Pain Location: Shoulder  Pain Orientation: Right  Pain Descriptors: Aching  Pain Frequency: Intermittent  Pain Intervention(s): Medication (See eMAR)    Objective   Vitals:  Heart Rate: 60  BP: 104/58  BP Location: Left arm  BP Method: Automatic  MAP (mmHg): 73  Patient Position: Sitting      Interventions:     Handoff from RN who reports pt is appropriate for therapy. Pt received sitting up in w/c. Agreeable to therapy. Pt dependently taken to [facility] gym.     Ambulated x150' with hemiwalker and SPV. VC throughout for increasing B step length and clearance in swing. Very slow gait speed.     Pt ambulated x10' to mat table as above. SPV for sit to supine transfer and VC for positioning. PT performed 2x10 PROM of shoulder flexion to 90 deg and ER to 35 deg. Then instructed pt on 3x10 AAROM of elbow and wrist flexion/extension. Finally x15 isometric shoulder abduction. Pt ambulated back to w/c.    Pt dependently taken back to room. Left with all immediate needs met.     Education Documentation  Safety issues and interventions, taught by Fernando Quarry, PT at 03/10/2024 10:00 AM.  Learner: Patient  Readiness: Acceptance  Method: Explanation, Demonstration  Response: [place]  Understanding, Demonstrated Understanding    Rehab techniques/procedure, taught by Fernando Quarry, PT at 03/10/2024 10:00 AM.  Learner: Patient  Readiness: Acceptance  Method: Explanation, Demonstration  Response: [place] Understanding, Demonstrated Understanding    Home management activities, taught by Fernando Quarry, PT at 03/10/2024 10:00 AM.  Learner: Patient  Readiness: Acceptance  Method: Explanation, Demonstration  Response: [place] Understanding, Demonstrated Understanding    Equipment, taught by Fernando Quarry, PT at 03/10/2024 10:00 AM.  Learner: Patient  Readiness: Acceptance  Method: Explanation, Demonstration  Response: [place] Understanding, Demonstrated Understanding    Functional transfers/mobility, taught by Fernando Quarry, PT at 03/10/2024 10:00 AM.  Learner: Patient  Readiness: Acceptance  Method: Explanation, Demonstration  Response: [place] Understanding, Demonstrated Understanding    Fall prevention/balance training, taught by Fernando Quarry, PT at 03/10/2024 10:00 AM.  Learner: Patient  Readiness: Acceptance  Method: Explanation, Demonstration  Response: Verbalizes Understanding, Demonstrated Understanding    Education Comments  No comments found.         Assessment & Plan     Pt continues to ambulate with very slow gait speed requiring extended time and VC for increasing step length. Although, no LOB noted throughout session. Improving shoulder ROM noted especially in flexion PROM able to reach 90 deg on this date.     Plan:  Risks/Benefits/POC Discussed with Pt/Family: With patient/family  Treatment/Interventions: Exercise, Gait training, Stair training, Functional transfer  training, LE strengthening/ROM, Endurance training, Cognitive reorientation, Patient/family training, Compensatory technique education    Recommendation:  Discharge Recommendation: Home with supervision, Home with home health PT  PT Therapy Recommendation: 5-6 days/week, 60-120 mins/day, 1:1 treatment,  Group therapy  PT Estimated Length of Stay: 7-10 days from eval 10/8

## 2024-03-10 NOTE — Progress Notes (Signed)
 PHYSICAL MEDICINE AND REHABILITATION  PROGRESS NOTE -- FACE-TO-FACE ENCOUNTER    Date Time: 03/10/24 3:18 PM  Patient Name: Jennifer [name], Jennifer [name]    Admission date:  03/03/2024  (LOS: 7 days)    Subjective:     Patient with minimal shoulder pain today , LOA today  for follow-up with surgeon for postop.  Patient walking with hemiwalker in therapy.  Patient with no complaints of seizure, denies headache, denies dizziness and no complaint of chest pain.  Patient had BM yesterday, denies dysuria.    Functional Status:     Pt dep taken to therapy gym for time efficiency and energy conservation.  Session focused on trailing shower tranfers. Pt already owns shower bench but has concerns about shower doors making it hard to enter, Simulated shower set up and therapist demonstrated completing short ambulation c hemi walker, turning, and entering shower c [name] posterior step into the shower. Pt able to demonstrate teach back c maxA verbal and physical cues for sequencing and motor planning.      Husband joined session- able to demonstrate transfer/ level of assistance needed- will continue to educate in family training tomorrow. Educated Pt and family on long handled shower sponge, using [name] robe before  getting out of the shower, and completing [name] dry run. Would benefit from further education.      Assisted Pt with removing sling and completed the following exercises from HEP   Pendulums  Shoulder shrugs and rolls.     Medications:   Medication reviewed by me:     Scheduled Meds: PRN Meds:   aspirin  EC, 325 mg, Oral, Daily  clonazePAM , 1 mg, Oral, Daily  docusate sodium , 100 mg, Oral, Daily  gabapentin , 200 mg, Oral, Q8H  heparin (porcine), 5,000 Units, Subcutaneous, Q8H SCH  lacosamide , 200 mg, Oral, BID  levETIRAcetam , 1,500 mg, Oral, Q12H SCH  levothyroxine , 100 mcg, Oral, Daily at 0600  metoprolol  succinate XL, 25 mg, Oral, Daily  pantoprazole, 40 mg, Oral, QAM AC  polyethylene glycol, 17 g, Oral, Daily  rosuvastatin , 5 mg, Oral,  QHS  sodium chloride , 250 mL, Intravenous, Once  triamterene -hydrochlorothiazide , 1 tablet, Oral, Daily  vitamin D  (ergocalciferol ), 50,000 Units, Oral, Weekly        Continuous Infusions:   lactulose, 10 g, Daily PRN  naloxone , 0.4 mg, PRN  ondansetron , 4 mg, Q8H PRN   Or  ondansetron , 4 mg, Q8H PRN  oxyCODONE , 10 mg, Q4H PRN  oxyCODONE , 5 mg, Q4H PRN  polyethylene glycol, 17 g, Daily PRN            Medication Review  [name] complete drug regimen review was completed: Yes  Were any drug issues found during review?: No       Review of Systems:   [name] comprehensive review of systems was: No fevers, chills, nausea, vomiting, chest pain, shortness of breath, cough, headache, double vision.  All others negative.    Physical Exam:     Vitals:    03/10/24 0900 03/10/24 0956 03/10/24 1034 03/10/24 1131   BP: (!) 85/49 (!) 85/49 104/58 103/61   Pulse: 60 60 60 60   Resp:       Temp:       TempSrc:       SpO2: 94% 94%  99%   Weight:       Height:           Intake and Output Summary (Last 24 hours) at Date Time    Intake/Output Summary (Last 24 hours) at  03/10/2024 1518  Last data filed at 03/10/2024 1200  Gross per 24 hour   Intake 1057 ml   Output 1 ml   Net 1056 ml     P.O.: 240 mL (03/10/24 1200)     Urine: 0 mL (03/09/24 2200)  Bladder Scan Volume (mL): 105 mL (03/06/24 1600)  Intermittent/Straight Cath (mL): 500 mL (03/04/24 0600)     Alert and NAD  Cardiac: regular rate and rhythm  Chest / Lungs:  Clear to auscultation.  Abdomen:  + bowel sounds, Soft, non-tender, non-distended.  Extremities: no calf tenderness. No pitting edema of BLE.    Labs:   No results for input(s): GLUCOSEWHOLE in the last 24 hours.    Recent Labs   Lab 03/09/24  0648 03/06/24  0610 03/04/24  0525   WBC 7.30 6.79 6.97   Hemoglobin 9.6* 10.2* 10.5*   Hematocrit 29.6* 31.3* 32.3*   Platelet Count 336 357* 352*        Recent Labs   Lab 03/04/24  0525   Sodium 138   Potassium 4.1   Chloride 102   CO2 27   BUN 15   Creatinine 0.7   Calcium  9.0   Albumin   2.9*   Protein, Total 5.7*   Bilirubin, Total 0.5   Alkaline Phosphatase 57   ALT 14   AST (SGOT) 16   Glucose 97             Results for orders placed or performed during the hospital encounter of 03/03/24 (from the past 24 hours)   Urinalysis with Reflex to Microscopic Exam and Culture    Collection Time: 03/09/24  9:54 PM    Specimen: Urine, Clean Catch   Result Value    Urine Color Straw    Urine Clarity Hazy    Urine Specific Gravity 1.017    Urine pH 6.0    Urine Leukocyte Esterase Large ([name])    Urine Nitrite Positive ([name])    Urine Protein Negative    Urine Glucose Negative    Urine Ketones Negative    Urine Urobilinogen Normal    Urine Bilirubin Negative    Urine Blood Negative    RBC, UA 0-2    Urine WBC 26-50 ([name])    Urine Squamous Epithelial Cells 0-5   Urine Gray Culture Hold Tube    Collection Time: 03/09/24  9:54 PM   Result Value    Extra Tube Hold for add-ons.            Rads:   Radiological Procedure reviewed.  No results found.        Assessment and Plan:     68 y/o female with PMH of epilepsy, HLD, hypothyroidism, DVT, s/p IVD filter, migraines, SVT, iron deficiency anemia, knee and hip replacements, colon resection, lumbar laminectomy, adverse anesthesia effects,  s/p fall after accidental OD on her meds, she had recent R shoulder replacement on 02/26/24.     #Rehab  - PT, OT with ADL, mobility, and ambulation with AD  - TR  - Fall, seizures, NWB RUE, and sling  -We had [name] team conference today  led by me and concur with team findings and recommendations. Please refer to the note for details.   10/10: IPOC completed today .     #Ataxia, accidental OD with meds  #s/p fall  -Monitor, esp with use of pain meds  -Therapy with gait training, stability  -Per neurology, ataxia attributed to medication OD     #H/o seizure  d/o  -Poss seizures  -CT, MRI of the brain with no acute abn  -Continue with seizure meds: Keppra , Lacosamide ,   -On low dose of clonazepam   -Seizure precaution  -Neurology f/u  10/10: Per  medicine, decrease gabapentin , after discussing with patient's husband who is [name] pharmacist, request reducing dosage, neurology consulted per medicine.  10/14: Patient, her husband, agree with continue with lower dose of gabapentin , eventually taper further, follow-up with neurology.     #S/p R shoulder replacement  -NWB RUE, on sling  -f/u with Dr. Barnabas  -shoulder x ray with postop changes, no displaced or fx  -Pt has f/u with orthopedic NP this Friday for postop f/u.  10/9: Per patient's husband, rescheduled for surgery follow-up with Dr. Brendon on 03/10/24 for postop follow-up.  10/13: LOA tomorrow for f/u with surgeon for postop f/u.  10/14: LOA today  and f/u with surgeon.     #HLD  - On Crestor      #Hypothyroidism  -On synthroid      #H/o iron def anemia  -CBC in am  -Est baseline Hgb 12.0 g/dl  -H/H 89.4/67.6 today , interm f/u. Postop anemia.  10/9: F/u CBC in am.  10/10: H/H 10.2/31.3 today , f/u CBC Monday.  10/13: H/H 9.6/29.6 today , f/u in 2 days to monitor anemia.     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 No leukocytosis.  10/14: UA with mod pyuria, rec medicine f/u for poss abx tx. F/u c/s.     #Dispo  -Plan for d/c on 03/13/24 with HH f/u.       Problem List[1]    Continue comprehensive and intensive inpatient rehab program, including:   Physical therapy 60-120 min daily, 5-6 times per week, Occupational therapy 60-120 min daily, 5-6 times per week, TR,Case management and Rehabilitation nursing                   Acute blood loss anemia requiring monitoring     Highest Value Lowest Value Decrease   Hemoglobin 13.2 g/dl  0/75/7974  8:71 PM 9.6 g/dl  89/86/7974  3:51 AM -3.6 g/dl   Hematocrit 59%  0/75/7974  1:28 PM  29.6%  03/09/2024  6:48 AM -10.4%           Signed by: Ozell GORMAN Lawyer, MD MD    [facility]    If there are questions or concerns about the content of this note or information contained within the body of this dictation they should be addressed directly with the author for clarification.                     [1]   Patient Active Problem List  Diagnosis    Primary osteoarthritis of left knee    Mass of right breast, unspecified quadrant    Ventricular tachyarrhythmia (CMS/HCC)    Hypertriglyceridemia    Hypothyroidism, unspecified type    DDD (degenerative disc disease), lumbosacral    Prediabetes    Class 3 severe obesity with serious comorbidity and body mass index (BMI) of 40.0 to 44.9 in adult, unspecified obesity type (CMS/HCC)    Seizure disorder (CMS/HCC)    SVT (supraventricular tachycardia)    History of colon resection  History of cholecystectomy    History of maternal deep vein thrombosis (DVT)    Morbid obesity with BMI of 45.0-49.9, adult (CMS/HCC)    Morbid obesity (CMS/HCC)    Seizures (CMS/HCC)

## 2024-03-10 NOTE — OT Progress Note (Signed)
 Occupational Therapy  Inpatient Rehabilitation Daily Progress Note    Patient Name:  Jennifer [name]       Medical Record Number: [id number]   Date of Birth: [birth year]  Sex: Female          Room/Bed:  M555/M555.01    Therapy Received:  Start Time: 0900   Stop Time: 1000  Total Therapy Minutes: 60    Rehabilitation Precautions/Restrictions:  Weight Bearing Status: RUE non weight bearing  Other Precautions: fall, seizure, sling at all times, cognitive impairments, shoulder replacmenet precautions - see surgeons guide in pt's binder    Rehab Diagnosis: Seizures (CMS/HCC) [R56.9]     Subjective   Patient Report: I feel better about that  Patient/Caregiver Goals: Stop falling.  Pain Assessment:      Objective   Vitals:  Heart Rate: 60  BP: (!) 85/49  BP Location: Left arm  MAP (mmHg): (!) 61  Patient Position: Sitting  SpO2: 94 %  O2 Device: None (Room air)    Interventions: Handoff completed with RN, Pt cleared for therapy. Pt met in chair, agreeable to therapy.     Pt dep taken to therapy gym for time efficiency and energy conservation.  Session focused on trailing shower tranfers. Pt already owns shower bench but has concerns about shower doors making it hard to enter, Simulated shower set up and therapist demonstrated completing short ambulation c hemi walker, turning, and entering shower c a posterior step into the shower. Pt able to demonstrate teach back c maxA verbal and physical cues for sequencing and motor planning.     Husband joined session- able to demonstrate transfer/ level of assistance needed- will continue to educate in family training tomorrow. Educated Pt and family on long handled shower sponge, using a robe before  getting out of the shower, and completing a dry run. Would benefit from further education.     Assisted Pt with removing sling and completed the following exercises from HEP   Pendulums  Shoulder shrugs and rolls.     Educated Pt on how to don sling and correct clip- would benefit  from reduction. Dep taken back to room and left in w/c- direct handoff to PT [place].          Education Documentation  Safety issues and interventions, taught by Nocole Zammit, OT at 03/10/2024 10:12 AM.  Learner: Family, Patient  Readiness: Acceptance  Method: Explanation, Teach Back  Response: Verbalizes Understanding, Needs Reinforcement, Demonstrated Understanding    Rehab techniques/procedure, taught by Olanna Percifield, OT at 03/10/2024 10:12 AM.  Learner: Family, Patient  Readiness: Acceptance  Method: Explanation, Teach Back  Response: Verbalizes Understanding, Needs Reinforcement, Demonstrated Understanding    Bowel and bladder management, taught by Fahim Kats, OT at 03/10/2024 10:12 AM.  Learner: Family, Patient  Readiness: Acceptance  Method: Explanation, Teach Back  Response: Verbalizes Understanding, Needs Reinforcement, Demonstrated Understanding    Precautions, taught by Halaina Vanduzer, OT at 03/10/2024 10:12 AM.  Learner: Family, Patient  Readiness: Acceptance  Method: Explanation, Teach Back  Response: Verbalizes Understanding, Needs Reinforcement, Demonstrated Understanding    Plan of care, taught by Braylee Lal, OT at 03/10/2024 10:12 AM.  Learner: Family, Patient  Readiness: Acceptance  Method: Explanation, Teach Back  Response: Verbalizes Understanding, Needs Reinforcement, Demonstrated Understanding    Pain management, taught by Ivanna Kocak, OT at 03/10/2024 10:12 AM.  Learner: Family, Patient  Readiness: Acceptance  Method: Explanation, Teach Back  Response: Verbalizes Understanding, Needs Reinforcement, Demonstrated Understanding  Fall prevention/balance training, taught by Evadene Wardrip, OT at 03/10/2024 10:12 AM.  Learner: Family, Patient  Readiness: Acceptance  Method: Explanation, Teach Back  Response: Verbalizes Understanding, Needs Reinforcement, Demonstrated Understanding    ADL retraining, taught by Jameis Newsham, OT at 03/10/2024 10:12 AM.  Learner: Family,  Patient  Readiness: Acceptance  Method: Explanation, Teach Back  Response: Verbalizes Understanding, Needs Reinforcement, Demonstrated Understanding    Education Comments  No comments found.        Assessment & Plan   Pt tolerate 60 min session well this date. Pt continues to be limited by decreased balance, endurance, and decreased motor coordination. Pt would benefit from continued skilled intervention to address mentioned deficits to maximize functional ind and ensure safe d/c. Continue with POC.      Plan:  Risks/Benefits/POC Discussed with Pt/Family: With patient  Treatment Interventions: ADL retraining, Functional transfer training, Endurance training, Patient/Family training, Equipment eval/education, Compensatory technique education    Recommendation:  Discharge Recommendation: Home with home health OT  OT Estimated Length of Stay: 7-10 days

## 2024-03-10 NOTE — Nursing Progress Note (Signed)
 Patient departed the facility via wheelchair Jennifer [name] for her follow-up appointment.

## 2024-03-11 LAB — LAB USE ONLY - CBC WITH DIFFERENTIAL
Absolute Basophils: 0.03 x10 3/uL (ref 0.00–0.08)
Absolute Eosinophils: 0.24 x10 3/uL (ref 0.00–0.44)
Absolute Immature Granulocytes: 0.04 x10 3/uL (ref 0.00–0.07)
Absolute Lymphocytes: 1.57 x10 3/uL (ref 0.42–3.22)
Absolute Monocytes: 0.43 x10 3/uL (ref 0.21–0.85)
Absolute Neutrophils: 3.87 x10 3/uL (ref 1.10–6.33)
Absolute nRBC: 0 x10 3/uL (ref <=0.00)
Basophils %: 0.5 %
Eosinophils %: 3.9 %
Hematocrit: 29.1 % — ABNORMAL LOW (ref 34.7–43.7)
Hemoglobin: 9.6 g/dL — ABNORMAL LOW (ref 11.4–14.8)
Immature Granulocytes %: 0.6 %
Lymphocytes %: 25.4 %
MCH: 31.8 pg (ref 25.1–33.5)
MCHC: 33 g/dL (ref 31.5–35.8)
MCV: 96.4 fL — ABNORMAL HIGH (ref 78.0–96.0)
MPV: 11 fL (ref 8.9–12.5)
Monocytes %: 7 %
Neutrophils %: 62.6 %
Platelet Count: 333 x10 3/uL (ref 142–346)
Preliminary Absolute Neutrophil Count: 3.87 x10 3/uL (ref 1.10–6.33)
RBC: 3.02 x10 6/uL — ABNORMAL LOW (ref 3.90–5.10)
RDW: 13 % (ref 11–15)
WBC: 6.18 x10 3/uL (ref 3.10–9.50)
nRBC %: 0 /100{WBCs} (ref <=0.0)

## 2024-03-11 MED ORDER — ACETAMINOPHEN 500 MG PO TABS
1000.0000 mg | ORAL_TABLET | Freq: Three times a day (TID) | ORAL | Status: DC
Start: 2024-03-11 — End: 2024-03-13
  Administered 2024-03-11 – 2024-03-13 (×4): 1000 mg via ORAL
  Filled 2024-03-11 (×5): qty 2

## 2024-03-11 MED ORDER — CEFUROXIME AXETIL 250 MG PO TABS
500.0000 mg | ORAL_TABLET | Freq: Two times a day (BID) | ORAL | Status: DC
Start: 2024-03-11 — End: 2024-03-13
  Administered 2024-03-11 – 2024-03-13 (×5): 500 mg via ORAL
  Filled 2024-03-11 (×5): qty 2

## 2024-03-11 MED ORDER — LIDOCAINE 5 % EX PTCH
1.0000 | MEDICATED_PATCH | CUTANEOUS | Status: DC
Start: 2024-03-11 — End: 2024-03-13
  Administered 2024-03-12: 1 via TRANSDERMAL
  Filled 2024-03-11: qty 1

## 2024-03-11 MED ORDER — DICLOFENAC SODIUM 1 % EX GEL
Freq: Three times a day (TID) | CUTANEOUS | Status: DC
Start: 2024-03-11 — End: 2024-03-12
  Filled 2024-03-11: qty 100

## 2024-03-11 NOTE — PT Progress Note (Signed)
 Physical Therapy  Inpatient Rehabilitation Daily Progress Note    Patient Name:  Jennifer [name]       Medical Record Number: [id number]   Date of Birth: 01/14/1956  Sex: Female          Room/Bed:  M555/M555.01    Therapy Received:  Start Time: 1400   Stop Time: 1500  Total Therapy Minutes: 60    Rehabilitation Precautions/Restrictions:  Weight Bearing Status: RUE non weight bearing  Other Precautions: fall, seizure, sling at all times, cognitive impairments, shoulder replacmenet precautions - see surgeons guide in pt's binder    Rehab Diagnosis: Seizures (CMS/HCC) [R56.9]     Subjective   Patient Report: Pt agrees to session.  Patient/Caregiver Goals: Stop falling.  Pain Assessment:  Pain Assessment: No/denies pain (Didn not ask; pt did not appear to be in pain.)    Objective   Vitals:  Heart Rate: 71  BP: 103/69  BP Location: Left arm  MAP (mmHg): 80  Patient Position: Sitting  SpO2: 96 %    Interventions:   Stair Negotiation  Pt instructed in stair negotiation for improved home mobility management.   Pt completes 4 x 3 stairs at 6 inch height, L hand rail ascending with SBA.  Cues provided for step-to gait pattern, leading with mix of RLE and LLE when ascending and LLE when descending.     Therapeutic Exercise  Pt completes exercises from surgeon to decrease pain and continue to maintain strength while decreasing overall stiffness  x 15:  Shoulder shrugs  Shoulder circles  AROM elbow flexion/extension  AROM wrist flexion/extension     Gait Training  Pt instructed in ambulation using HW with CGA to SBA and NO WC follow.  Pt completes 49' with multiple turns.  No LOB and improved technique compared to observation during previous session this writer treated pt (pt lifts feet off ground and has normal step length).    Patient Disposition: in room, in chair, other (comment) (Initially left in TR gym, however pt declines at last minute. Assisted pt back to room.)  Safety Interventions: oriented to call bell and  placed within reach, personal items within reach, assistive devices out of reach, chair alarm acitvated, bed placed in lowest position    Education Documentation  Home management activities, taught by Rollene Laneta Browning, LPTA at 03/11/2024  2:00 PM.  Learner: Patient  Readiness: Acceptance  Method: Explanation, Teach Back  Response: Verbalizes Understanding    Education Comments  No comments found.         Assessment & Plan   [name] continues to complete a flight of stairs and ambulate 70 plus feet during sessions. She required some redirection to task this afternoon, after family conference. She is on track for her scheduled discharge date. Continue plan of care as set by evaluating Physical Therapist.     Plan:  Risks/Benefits/POC Discussed with Pt/Family: With patient/family  Treatment/Interventions: Exercise, Gait training, Stair training, Functional transfer training, LE strengthening/ROM, Endurance training, Cognitive reorientation, Patient/family training, Compensatory technique education    Recommendation:  Discharge Recommendation: Home with supervision, Home with home health PT  PT Therapy Recommendation: 5-6 days/week, 60-120 mins/day, 1:1 treatment, Group therapy  PT Estimated Length of Stay: 7-10 days from eval 10/8

## 2024-03-11 NOTE — Progress Notes (Signed)
 PHYSICAL MEDICINE AND REHABILITATION  PROGRESS NOTE -- FACE-TO-FACE ENCOUNTER    Date Time: 03/11/24 5:27 PM  Patient Name: Jennifer [name], Jennifer [name]    Admission date:  03/03/2024  (LOS: 8 days)    Subjective:     Patient with right shoulder pain, increased with movement.  No complaints of chest pain or abdominal pain.  Patient denies fever and chills.  Patient had BM today , denies dysuria.  Had family training, also had family meeting with therapist, patient and patient's husband, CM and nursing.    Functional Status:     Ambulation: Pt ambulated x40' with hemiwalker and SPV from husband and PT. PT educated on looking out for shuffling gait pattern and cuing for increasing B step length      Stairs: Pt ascended/descended 4x6 stairs 3x total. 1x with PT providing CGA and educating spouse on standing one step below. 2nd and 3rd bout completed with husband providing CGA. Completed 2x 8 stair with no HR - min [name] via HHA and at trunk x1 with PT and x1 with husband for entrance into home.     Bed mobility: Pt completed sit <> supine with SPV and extended time. Discussed use of raising HOB at home to assist with sitting up as needed. Educated placing pillow under pt' arm as needed for comfort and to prevent extension     Pain mgmt: Educated on pain science due to pt inquiring about level of pain to tae medication - reviewed anticipated pain after surgery and goal to be able to complete daily tasks. Educated on non-pharmological pain meds including ice, icyhot, etc.      Requested to use bathroom - stand pivot transfer to toilet with SPV and use of rails. PT dependently completed posterior pericare.     Medications:   Medication reviewed by me:     Scheduled Meds: PRN Meds:   acetaminophen , 1,000 mg, Oral, Q8H  aspirin  EC, 325 mg, Oral, Daily  cefuroxime, 500 mg, Oral, Q12H SCH  clonazePAM , 1 mg, Oral, Daily  diclofenac  Sodium, , Topical, TID  docusate sodium , 100 mg, Oral, Daily  gabapentin , 200 mg, Oral, Q8H  heparin  (porcine), 5,000 Units, Subcutaneous, Q8H SCH  lacosamide , 200 mg, Oral, BID  levETIRAcetam , 1,500 mg, Oral, Q12H SCH  levothyroxine , 100 mcg, Oral, Daily at 0600  lidocaine , 1 patch, Transdermal, Q24H  metoprolol  succinate XL, 25 mg, Oral, Daily  pantoprazole, 40 mg, Oral, QAM AC  polyethylene glycol, 17 g, Oral, Daily  rosuvastatin , 5 mg, Oral, QHS  sodium chloride , 250 mL, Intravenous, Once  triamterene -hydrochlorothiazide , 1 tablet, Oral, Daily  vitamin D  (ergocalciferol ), 50,000 Units, Oral, Weekly        Continuous Infusions:   lactulose, 10 g, Daily PRN  naloxone , 0.4 mg, PRN  ondansetron , 4 mg, Q8H PRN   Or  ondansetron , 4 mg, Q8H PRN  oxyCODONE , 10 mg, Q4H PRN  oxyCODONE , 5 mg, Q4H PRN  polyethylene glycol, 17 g, Daily PRN            Medication Review  [name] complete drug regimen review was completed: Yes  Were any drug issues found during review?: No       Review of Systems:   [name] comprehensive review of systems was: No fevers, chills, nausea, vomiting, chest pain, shortness of breath, cough, headache, double vision.  All others negative.    Physical Exam:     Vitals:    03/11/24 0626 03/11/24 0845 03/11/24 1454 03/11/24 1608   BP:  93/54 103/69 110/62  Pulse: 63 78 71 73   Resp:  20  18   Temp:  97.9 F (36.6 C)  98.6 F (37 C)   TempSrc:  Oral  Oral   SpO2:  98% 96% 98%   Weight:       Height:           Intake and Output Summary (Last 24 hours) at Date Time    Intake/Output Summary (Last 24 hours) at 03/11/2024 1727  Last data filed at 03/11/2024 1342  Gross per 24 hour   Intake 900 ml   Output 0 ml   Net 900 ml     P.O.: 240 mL (03/11/24 1342)     Urine: 0 mL (03/11/24 1342)  Bladder Scan Volume (mL): 105 mL (03/06/24 1600)  Intermittent/Straight Cath (mL): 500 mL (03/04/24 0600)     Alert and NAD  Cardiac: regular rate and rhythm  Chest / Lungs:  Clear to auscultation.  Abdomen:  + bowel sounds, Soft, non-tender, non-distended.  Extremities: no calf tenderness. No pitting edema of BLE.    Labs:   No  results for input(s): GLUCOSEWHOLE in the last 24 hours.    Recent Labs   Lab 03/11/24  0524 03/09/24  0648 03/06/24  0610   WBC 6.18 7.30 6.79   Hemoglobin 9.6* 9.6* 10.2*   Hematocrit 29.1* 29.6* 31.3*   Platelet Count 333 336 357*                      Results for orders placed or performed during the hospital encounter of 03/03/24 (from the past 24 hours)   CBC with Differential (Component)    Collection Time: 03/11/24  5:24 AM   Result Value    WBC 6.18    Hemoglobin 9.6 (L)    Hematocrit 29.1 (L)    Platelet Count 333    MPV 11.0    RBC 3.02 (L)    MCV 96.4 (H)    MCH 31.8    MCHC 33.0    RDW 13    nRBC % 0.0    Absolute nRBC 0.00    Preliminary Absolute Neutrophil Count 3.87    Neutrophils % 62.6    Lymphocytes % 25.4    Monocytes % 7.0    Eosinophils % 3.9    Basophils % 0.5    Immature Granulocytes % 0.6    Absolute Neutrophils 3.87    Absolute Lymphocytes 1.57    Absolute Monocytes 0.43    Absolute Eosinophils 0.24    Absolute Basophils 0.03    Absolute Immature Granulocytes 0.04            Rads:   Radiological Procedure reviewed.  No results found.        Assessment and Plan:     68 y/o female with PMH of epilepsy, HLD, hypothyroidism, DVT, s/p IVD filter, migraines, SVT, iron deficiency anemia, knee and hip replacements, colon resection, lumbar laminectomy, adverse anesthesia effects,  s/p fall after accidental OD on her meds, she had recent R shoulder replacement on 02/26/24.     #Rehab  - PT, OT with ADL, mobility, and ambulation with AD  - TR  - Fall, seizures, NWB RUE, and sling  -We had [name] team conference today  led by me and concur with team findings and recommendations. Please refer to the note for details.   10/10: IPOC completed today .  10/15: We had [name] team conference today  led by me and concur with team  findings and recommendations. Please refer to the note for details.  Also had family meeting with patient, her husband, CM, therapist, nursing and updated on the medical and functional status.      #Ataxia, accidental OD with meds  #s/p fall  -Monitor, esp with use of pain meds  -Therapy with gait training, stability  -Per neurology, ataxia attributed to medication OD     #H/o seizure d/o  -Poss seizures  -CT, MRI of the brain with no acute abn  -Continue with seizure meds: Keppra , Lacosamide ,   -On low dose of clonazepam   -Seizure precaution  -Neurology f/u  10/10: Per medicine, decrease gabapentin , after discussing with patient's husband who is [name] pharmacist, request reducing dosage, neurology consulted per medicine.  10/14: Patient, her husband, agree with continue with lower dose of gabapentin , eventually taper further, follow-up with neurology.  10/15: After discussing with family meeting will continue with current medication and to follow-up with neurologist to adjust meds.     #S/p R shoulder replacement  -NWB RUE, on sling  -f/u with Dr. Barnabas  -shoulder x ray with postop changes, no displaced or fx  -Pt has f/u with orthopedic NP this Friday for postop f/u.  10/9: Per patient's husband, rescheduled for surgery follow-up with Dr. Brendon on 03/10/24 for postop follow-up.  10/13: LOA tomorrow for f/u with surgeon for postop f/u.  10/14: LOA today  and f/u with surgeon.  10/15: Started on protocol for shoulder surgery, and Voltaren  gel, lidocaine  patch and Tylenol  1000 mg every 8 hours for shoulder pain.  Patient and her husband agree with the plan.  Consider taking less narcotics for pain.     #HLD  - On Crestor      #Hypothyroidism  -On synthroid      #H/o iron def anemia  -CBC in am  -Est baseline Hgb 12.0 g/dl  -H/H 89.4/67.6 today , interm f/u. Postop anemia.  10/9: F/u CBC in am.  10/10: H/H 10.2/31.3 today , f/u CBC Monday.  10/13: H/H 9.6/29.6 today , f/u in 2 days to monitor anemia.  10/15: H/H 9.6/29.1 today , stable, interm f/u.     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 No leukocytosis.  10/14: UA with mod pyuria, rec medicine f/u for poss abx tx. F/u c/s.  10/15: Urine growing E. coli, started on Ceftin per medicine. F/u C/S.     #Dispo  -Plan for d/c on 03/13/24 with HH f/u.       Problem List[1]    Continue comprehensive and intensive inpatient rehab program, including:   Physical therapy 60-120 min daily, 5-6 times per week, Occupational therapy 60-120 min daily, 5-6 times per week, TR,Case management and Rehabilitation nursing  Greater than 65 minutes was spent with patient regarding family meeting, treatment plan, discharge planning with the team.                 Acute blood loss anemia requiring monitoring     Highest Value Lowest Value Decrease   Hemoglobin 13.2 g/dl  0/75/7974  8:71 PM 9.6 g/dl  89/84/7974  4:75 AM -3.6 g/dl   Hematocrit 59%  0/75/7974  1:28 PM  29.1%  03/11/2024  5:24 AM -10.9%  Signed by: Ozell GORMAN Lawyer, MD MD    [facility]    If there are questions or concerns about the content of this note or information contained within the body of this dictation they should be addressed directly with the author for clarification.                       [1]   Patient Active Problem List  Diagnosis    Primary osteoarthritis of left knee    Mass of right breast, unspecified quadrant    Ventricular tachyarrhythmia (CMS/HCC)    Hypertriglyceridemia    Hypothyroidism, unspecified type    DDD (degenerative disc disease), lumbosacral    Prediabetes    Class 3 severe obesity with serious comorbidity and body mass index (BMI) of 40.0 to 44.9 in adult, unspecified obesity type (CMS/HCC)    Seizure disorder (CMS/HCC)    SVT (supraventricular tachycardia)    History of colon resection    History of cholecystectomy    History of maternal deep vein thrombosis (DVT)    Morbid obesity with BMI of 45.0-49.9, adult (CMS/HCC)    Morbid obesity (CMS/HCC)    Seizures (CMS/HCC)

## 2024-03-11 NOTE — Care and Service Plan (Signed)
 Team Conference Note    Patient Name:  Jennifer [name]       Medical Record Number: [id number]   Date of Birth: [birth year]  Sex: Female          Room/Bed:  M555/M555.01    Admitting Diagnosis: Seizures (CMS/HCC) [R56.9]   Admit Date/Time:  03/03/2024  6:53 PM  Admission Comments: No comment available     Primary Diagnosis: Seizures (CMS/HCC)    Patient Active Problem List    Diagnosis Date Noted    Seizures (CMS/HCC) 03/03/2024    Morbid obesity (CMS/HCC) 04/26/2023    Morbid obesity with BMI of 45.0-49.9, adult (CMS/HCC) 04/10/2023    DDD (degenerative disc disease), lumbosacral 12/26/2022    Prediabetes 12/26/2022    Class 3 severe obesity with serious comorbidity and body mass index (BMI) of 40.0 to 44.9 in adult, unspecified obesity type (CMS/HCC) 12/26/2022    Seizure disorder (CMS/HCC) 12/26/2022    SVT (supraventricular tachycardia) 12/26/2022    History of colon resection 12/26/2022    History of cholecystectomy 12/26/2022    History of maternal deep vein thrombosis (DVT) 12/26/2022    Ventricular tachyarrhythmia (CMS/HCC) 04/06/2022    Hypertriglyceridemia 04/06/2022    Hypothyroidism, unspecified type 04/06/2022    Mass of right breast, unspecified quadrant 09/25/2021    Primary osteoarthritis of left knee 02/12/2020       Vital Signs  Blood Pressure: 93/54  Temperature: 97.9 F (36.6 C)  Pulse: 78  Respirations: 20  Pain Scale Used: Numeric Scale (0-10)  Pain Score: 2  Pain Location: Shoulder  Pain Orientation: Right  Pain Descriptors: Aching; Discomfort  Pain Frequency: Intermittent      Weight and Nutrition  Admission Weight: 101 kg (222 lb 10.6 oz)  Current Weight: 105.8 kg (233 lb 4 oz)  Diet Type: Regular    Plan of Care  Anticipated Discharge Date: Mar 13, 2024  Discharge Plan: Home  Fall Risk Level: Yellow  Is the duration of therapy 15 hours over 7 days instead of the standard 3 hours of therapy, 5 out of 7 days a week?: No  The patient will benefit from continued services by:   Physical Therapy  Recommendation: 5-6 days/week, 60-120 mins/day, 1:1 treatment, Group therapy  Occupational Therapy Recommendation: 5-6 days/week, 60-120 mins/day, 1:1 treatment, Group therapy  Speech Language Pathology Recommendation: Discharge SLP services      The following is a list of patient problems that have been identified by the interdisciplinary team:   Encounter Problems       Encounter Problems (Active)       Mobility       LTG: Pt will transfer and ambulate x50' independently with LRAD to navigate home environment  (Progressing)       Start:  03/04/24    Expected End:  03/13/24               Pain Management       LTG: Patient will verbalize pain relief less than 2/10 with the use of pain medication and nonpharmacological pain measures such as deep breathing, relaxation techniques and repositioning 100% of the time (Progressing)       Start:  03/04/24    Expected End:  04/04/24         Goal Note       PRN pain medication given twice during the shift and well toerated .                 Risk of Infection  LTG: Patient incisions and abrasions will be free of any signs or symptoms of infection and will advance to healed by at least 50% by discharge date. (Progressing)       Start:  03/04/24    Expected End:  04/04/24         Goal Note       No signs of infection noted during the shift , related to L shoulder incision.                 Safety Risk       LTG: Patient will acknowledge limitations and call for assistance before transferring in and out of bed and with ambulation 100% of the tim (Progressing)       Start:  03/04/24    Expected End:  04/04/24         Goal Note       Patient called appropriately and fall precautions in place .                 Self Care Management       LTG: Pt will complete daily grooming routine with mod I (Progressing)       Start:  03/04/24    Expected End:  03/11/24               Skin Wound Management       LTG: Patient will show no evidence of skin breakdown during hospitalization.  (Progressing)       Start:  03/04/24    Expected End:  04/04/24         Goal Note       Skin intact , no new skin breakdown , patient able to turn and reposition self while in bed .                     Rehab Team Discussion        Self Care Functional Status:   Admission Assessment Current Status Discharge  Goal   Functional Area: Care Score:  Care Score:    Eating 5 3 (03/04/24 0800) 6   Oral Hygiene 4 5 (03/09/24 0840) 6   Toileting Hygiene 3 2 (03/09/24 1300) 6   Shower/Bathe Self 3 3 (03/09/24 0840) 6   Upper Body Dressing 2 3 (03/09/24 0840) 6   Lower Body Dressing 2 3 (03/09/24 0840) 6   Putting On/Taking Off Footwear 3 3 (03/09/24 0840) 6       Mobility Functional Status:   Admission Assessment Current   Status  Discharge   Goal   Functional Area: Care Score:  Care Score:    Roll Left and Right 3 88 (03/09/24 1400) 6 (To L, will not roll to R due ot precautions)   Sit to Lying 3 4 (03/09/24 1400) 6   Lying to Sitting on Side of Bed 3 3 (03/09/24 1400) 6   Sit to Stand 3 4 (03/09/24 1400) 6   Chair/Bed-to-Chair Transfer 2 4 (03/09/24 1400) 6   Toilet Transfer 3 4 (03/09/24 1300) 6   Car Transfer 88 3 (03/09/24 1400) 6   Walk 10 Feet 3 4 (03/09/24 1400) 6   Walk 50 Feet with Two Turns 3 4 (03/09/24 1400) 6   Walk 150 Feet 88 4 (03/09/24 1400) 6   Walking 10 Feet on Uneven Surfaces 88 4 (03/09/24 1400) 6   1 Step (Curb) 88 88 (03/09/24 1400) 6   4 Steps 88 4 (03/09/24 1400) 6  12 Steps 88 88 (03/09/24 1400) 4   Wheel 50 Feet with Two Turns         Wheel 150 Feet         Picking Up Object 4 4 (03/09/24 0840) Independent     As the rehab MD, I led this interdisciplinary meeting and concur with its results and findings.   Rea Ozell RAMAN, MD

## 2024-03-11 NOTE — Plan of Care (Signed)
 Problem: Pain Management  Goal: LTG: Patient will verbalize pain relief less than 2/10 with the use of pain medication and nonpharmacological pain measures such as deep breathing, relaxation techniques and repositioning 100% of the time  Outcome: Progressing: Per patient request, complaining of pain 8 out of 10, medicated with oxycodone  10mg  po.  Patient refused lidocaine  patch, diclofenac  gel and Tylenol . Dr. Rea made aware.     Problem: Risk of Infection  Goal: LTG: Patient incisions and abrasions will be free of any signs or symptoms of infection and will advance to healed by at least 50% by discharge date.  Outcome: Progressing: Patient has a new order for oral antibiotic and she took the first dose and tolerated well. Educated on possible side effects with understanding verbalized.

## 2024-03-11 NOTE — PT Progress Note (Signed)
 Physical Therapy - Family Training   Inpatient Rehabilitation Daily Progress Note    Patient Name:  Jennifer [name]       Medical Record Number: [id number]   Date of Birth: 1955/11/14  Sex: Female          Room/Bed:  M555/M555.01    Therapy Received:  Start Time: 0900   Stop Time: 1000  Total Therapy Minutes: 60    Rehabilitation Precautions/Restrictions:  Weight Bearing Status: RUE non weight bearing  Other Precautions: fall, seizure, sling at all times, cognitive impairments, shoulder replacmenet precautions - see surgeons guide in pt's binder    Rehab Diagnosis: Seizures (CMS/HCC) [R56.9]     Subjective   Patient Report: What level of pain is too much?  Patient/Caregiver Goals: Stop falling.  Pain Assessment:  Pain Assessment: Numeric Scale (0-10)  Pain Score: 5-moderate pain  Pain Location: Shoulder  Pain Orientation: Right  Pain Descriptors: Aching  Pain Frequency: Intermittent  Pain Intervention(s): Distraction    Objective   Vitals:        Interventions:     Handoff from RN who reports pt is appropriate for therapy. Pt received sitting up in w/c with husband who was present throughout session for family training. Agreeable to therapy. Pt dependently taken to [facility] gym.     Family Training:     Ambulation: Pt ambulated x40' with hemiwalker and SPV from husband and PT. PT educated on looking out for shuffling gait pattern and cuing for increasing B step length     Stairs: Pt ascended/descended 4x6 stairs 3x total. 1x with PT providing CGA and educating spouse on standing one step below. 2nd and 3rd bout completed with husband providing CGA. Completed 2x 8 stair with no HR - min A via HHA and at trunk x1 with PT and x1 with husband for entrance into home.    Bed mobility: Pt completed sit <> supine with SPV and extended time. Discussed use of raising HOB at home to assist with sitting up as needed. Educated placing pillow under pt' arm as needed for comfort and to prevent extension    Pain mgmt: Educated on  pain science due to pt inquiring about level of pain to tae medication - reviewed anticipated pain after surgery and goal to be able to complete daily tasks. Educated on non-pharmological pain meds including ice, icyhot, etc.     Requested to use bathroom - stand pivot transfer to toilet with SPV and use of rails. PT dependently completed posterior pericare. Transfer back to bed as outlines below. Left with all immediate needs met.     Education Documentation  Safety issues and interventions, taught by Fernando Quarry, PT at 03/11/2024  9:00 AM.  Learner: Family, Patient  Readiness: Acceptance  Method: Explanation, Demonstration  Response: Verbalizes Understanding, Demonstrated Understanding  Comment: Husband present for family training    Rehab techniques/procedure, taught by Fernando Quarry, PT at 03/11/2024  9:00 AM.  Learner: Family, Patient  Readiness: Acceptance  Method: Explanation, Demonstration  Response: Verbalizes Understanding, Demonstrated Understanding  Comment: Husband present for family training    Orthotics/prosthetics, taught by Fernando Quarry, PT at 03/11/2024  9:00 AM.  Learner: Family, Patient  Readiness: Acceptance  Method: Explanation, Demonstration  Response: Verbalizes Understanding, Demonstrated Understanding  Comment: Husband present for family training    Home management activities, taught by Fernando Quarry, PT at 03/11/2024  9:00 AM.  Learner: Family, Patient  Readiness: Acceptance  Method: Explanation, Demonstration  Response: Verbalizes Understanding, Demonstrated Understanding  Comment: Husband present for family training    CAR transfers, taught by Fernando Quarry, PT at 03/11/2024  9:00 AM.  Learner: Family, Patient  Readiness: Acceptance  Method: Explanation, Demonstration  Response: Verbalizes Understanding, Demonstrated Understanding  Comment: Husband present for family training    Pain management, taught by Fernando Quarry, PT at 03/11/2024  9:00 AM.  Learner: Family,  Patient  Readiness: Acceptance  Method: Explanation, Demonstration  Response: Verbalizes Understanding, Demonstrated Understanding  Comment: Husband present for family training    Functional transfers/mobility, taught by Fernando Quarry, PT at 03/11/2024  9:00 AM.  Learner: Family, Patient  Readiness: Acceptance  Method: Explanation, Demonstration  Response: Verbalizes Understanding, Demonstrated Understanding  Comment: Husband present for family training    Fall prevention/balance training, taught by Fernando Quarry, PT at 03/11/2024  9:00 AM.  Learner: Family, Patient  Readiness: Acceptance  Method: Explanation, Demonstration  Response: Verbalizes Understanding, Demonstrated Understanding  Comment: Husband present for family training    Education Comments  No comments found.         Assessment & Plan     FAMILY TRAINING: Patient and Significant Other present for family training session. No further training is required at this time    Pt and husband demonstrated good understanding of recommendations and CLOF for d/c at end of week. Decreased shuffling noted today  compared to previous session. Pt will benefit form continued practice with steps without rails to enter home.       Plan:  Risks/Benefits/POC Discussed with Pt/Family: With patient/family  Treatment/Interventions: Exercise, Gait training, Stair training, Functional transfer training, LE strengthening/ROM, Endurance training, Cognitive reorientation, Patient/family training, Compensatory technique education    Recommendation:  Discharge Recommendation: Home with supervision, Home with home health PT  PT Therapy Recommendation: 5-6 days/week, 60-120 mins/day, 1:1 treatment, Group therapy  PT Estimated Length of Stay: 7-10 days from eval 10/8

## 2024-03-11 NOTE — OT Progress Note (Signed)
 Occupational Therapy  Inpatient Rehabilitation Daily Progress/Family Training Note    Patient Name:  Jennifer [name]       Medical Record Number: [id number]   Date of Birth: 11/02/1955  Sex: Female          Room/Bed:  M555/M555.01    Therapy Received:  Start Time: 1000   Stop Time: 1100  Total Therapy Minutes: 60    Rehabilitation Precautions/Restrictions:  Weight Bearing Status: RUE non weight bearing  Other Precautions: fall, seizure, sling at all times, cognitive impairments, shoulder replacmenet precautions - see surgeons guide in pt's binder    Rehab Diagnosis: Seizures (CMS/HCC) [R56.9]     Subjective   Patient Report: Im in some pain  Patient/Caregiver Goals: Stop falling.  Pain Assessment:      Objective   Interventions: Handoff completed with RN, Pt cleared for therapy. Pt met seated in chair c husband present for family training this date.    Brace Management  Husband able to don/doff brace and demonstrates good understanding of placement. Added ace bandage to neck strap of brace to prevent skin breakdown.    Equipment   Pt and husband have ordered all equipment (BSC, toilet wand), long handle sponge and they have been provided with handouts for additional equipment. They already own shower bench.       Shower/Toilet tranfers  Husband able to demonstrate appropriate guarding during short ambulation c hemi walker to access bathroom.  Husband able to assist with doffing pants on R side. They have ordered a wand to assist with completing pericare. Pt able complete hygiene anteriorly c set up assist.  Completed shower transfer c short ambulating c hemi walker c husband guarding appropriately. Husband removed glass doors and pole in shower so Pt is able to sit in middle on bench. Able to reach water  and items- Practiced reaching c L arm to reach shampoo and conditioner. Educated on using robe to dry off, having appropriate time to shower and completing a dry run before completing.     LB/UB  dressing  Educated on dressing techniques c shoulder brace to maintain precautions. Family has order Velcro shirts but also has been able to don normal shirts. Husband able to assist c threading pants and donning over hips.         Pt left in chair c alarm on. Handoff to RN, all needs met.        Education Documentation  Safety issues and interventions, taught by Levis Nazir, OT at 03/11/2024 12:09 PM.  Learner: Family, Patient  Readiness: Acceptance  Method: Explanation, Teach Back  Response: Verbalizes Understanding, Demonstrated Understanding, Needs Reinforcement    Rehab techniques/procedure, taught by Hanni Milford, OT at 03/11/2024 12:09 PM.  Learner: Family, Patient  Readiness: Acceptance  Method: Explanation, Teach Back  Response: Verbalizes Understanding, Demonstrated Understanding, Needs Reinforcement    Precautions, taught by Floyce Bujak, OT at 03/11/2024 12:09 PM.  Learner: Family, Patient  Readiness: Acceptance  Method: Explanation, Teach Back  Response: Verbalizes Understanding, Demonstrated Understanding, Needs Reinforcement    Pain management, taught by Taniaya Rudder, OT at 03/11/2024 12:09 PM.  Learner: Family, Patient  Readiness: Acceptance  Method: Explanation, Teach Back  Response: Verbalizes Understanding, Demonstrated Understanding, Needs Reinforcement    Fall prevention/balance training, taught by Analycia Khokhar, OT at 03/11/2024 12:09 PM.  Learner: Family, Patient  Readiness: Acceptance  Method: Explanation, Teach Back  Response: Verbalizes Understanding, Demonstrated Understanding, Needs Reinforcement    ADL retraining, taught by August Longest, OT at 03/11/2024  12:09 PM.  Learner: Family, Patient  Readiness: Acceptance  Method: Explanation, Teach Back  Response: Verbalizes Understanding, Demonstrated Understanding, Needs Reinforcement    Education Comments  No comments found.        Assessment & Plan   Family training went well this date. All questions answered and no further  training required.  Pt would benefit from continued skilled intervention to address mentioned deficits to maximize functional ind and ensure safe d/c. Continue with POC.      Plan:  Risks/Benefits/POC Discussed with Pt/Family: With patient  Treatment Interventions: ADL retraining, Functional transfer training, Endurance training, Patient/Family training, Equipment eval/education, Compensatory technique education    Recommendation:  Discharge Recommendation: Home with home health OT  OT Estimated Length of Stay: 7-10 days

## 2024-03-11 NOTE — Progress Notes (Signed)
 Case Management  Inpatient Rehabilitation Family Conference Note     Patient Name:  Jennifer [name]       Medical Record Number: [id number]   Date of Birth: [birth year]  Sex: Female          Room/Bed:  M555/M555.01  Rehab Diagnosis: Seizures (CMS/HCC) [R56.9]     Team Members present at conference:   Doctor: Rea  Nurse: Asberry  Case Manager: Micheline  PT: Lacinda  OT: Carolyn  Psych/Neuropsych: Dr. Elbert    Family members and significant others present at conference: Patient and patient's spouse.      Issues discussed during conference: Family meeting to discuss patient's current level of functioning, recommendations and discharge disposition. MD discussed patient's current medical and medication needs (see most current physician note).  PT discussed patient will discharge home with hemi walker and will recommend general supervision when ambulating at home and will need hands on assistance on stairs. OT discussed will recommend BSC, sponges with handle for bathing. Patient independent with ADLs but will require some supervision with entering and exiting bath/shower. Dr. Elbert will continue to meet with patient and spouse for support throughout the stay. CM discussed coordinating home health RN, PT, OT visits, will provide patient's spouse with medical records release form for patient's neurologist and will provide hand outs on personal care aide agencies if needed.  Patient and patient's spouse confirmed discharge home on 10/17, patient's spouse will be available to help support at home.  All questions and concerns addressed at time of meeting.      Self Care Functional Status:   Current Status   Functional Area: Care Score:   Eating 3   Oral Hygiene 5   Toileting Hygiene 2   Shower/Bathe Self 3   Upper Body Dressing 3   Lower Body Dressing 3   Putting On/Taking Off Footwear 3       Mobility Functional Status:   Current   Status   Functional Area: Care Score:   Roll Left and Right 88   Sit to Lying 4   Lying to  Sitting on Side of Bed 3   Sit to Stand 4   Chair/Bed-to-Chair Transfer 4   Toilet Transfer 4   Car Transfer 3   Walk 10 Feet 4   Walk 50 Feet with Two Turns 4   Walk 150 Feet 4   Walk 50 Feet with Two Turns 4   1 Step (Curb) 88   4 Steps 4   12 Steps 88   Wheel 50 Feet with Two Turns     Wheel 150 Feet         Montie Levy, LMSW, ACMA-SW  Social Work Case Manager  [facility]   Phone: [phone]  Fax: [phone]  [email or web address]

## 2024-03-11 NOTE — Progress Notes (Signed)
 Case Management location: onsite     Team conference held on 03/11/24. Team established estimated [facility] date as 03/13/24     Team recommending:  Home with Home Health Services     CM met with patient and patient's spouse bedside to discuss patient's estimated discharge date and disposition. Patient's spouse completed FT, feels good about patient discharging home with [facility] services.  CM discussed will meet with patient and spouse to provide confirmed [facility] agency and recommended follow up providers.     Family Meeting completed    Family Training scheduled on 03/11/2024    Nursing education to be completed: Pain Management     Therapy Team will review patient's DME needs with you prior to discharge.     Transport per therapy team: Car     Pick up time on day of Discharge: [facility] 9 am - 11 am     Referral sent to [facility] team for [facility] Nurse, Physical Therapy , and Occupational Therapy    Montie Levy, LMSW, ACMA-SW  Social Work Case Manager  [facility]   Phone: [phone]  Fax: [phone]  [email or web address]
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
## 2024-03-11 NOTE — Plan of Care (Signed)
 Problem: Pain Management  Goal: LTG: Patient will verbalize pain relief less than 2/10 with the use of pain medication and nonpharmacological pain measures such as deep breathing, relaxation techniques and repositioning 100% of the time  Outcome: Progressing  Note: PRN pain medication given twice during the shift and well toerated .     Problem: Risk of Infection  Goal: LTG: Patient incisions and abrasions will be free of any signs or symptoms of infection and will advance to healed by at least 50% by discharge date.  Outcome: Progressing  Note: No signs of infection noted during the shift , related to L shoulder incision.     Problem: Safety Risk  Goal: LTG: Patient will acknowledge limitations and call for assistance before transferring in and out of bed and with ambulation 100% of the tim  Outcome: Progressing  Note: Patient called appropriately and fall precautions in place .

## 2024-03-11 NOTE — Nursing Progress Note (Signed)
 Patient has refused all of her afternoon medications. Dr. Rea made aware.

## 2024-03-12 LAB — CULTURE, URINE
Culture Urine: >100000 — AB
Culture Urine: >100000 — AB

## 2024-03-12 MED ORDER — METOPROLOL SUCCINATE ER 25 MG PO TB24: 25.0000 mg | ORAL_TABLET | Freq: Every day | ORAL | Status: DC

## 2024-03-12 MED ORDER — ASPIRIN 325 MG PO TBEC
325.0000 mg | DELAYED_RELEASE_TABLET | Freq: Every day | ORAL | Status: DC
Start: 2024-03-14 — End: 2024-04-06

## 2024-03-12 MED ORDER — DSS 100 MG PO CAPS
100.0000 mg | ORAL_CAPSULE | Freq: Every day | ORAL | Status: AC
Start: 2024-03-14 — End: ?

## 2024-03-12 MED ORDER — LEVETIRACETAM 750 MG PO TABS
1500.0000 mg | ORAL_TABLET | Freq: Two times a day (BID) | ORAL | Status: AC
Start: 2024-03-13 — End: ?

## 2024-03-12 MED ORDER — LACOSAMIDE 200 MG PO TABS
200.0000 mg | ORAL_TABLET | Freq: Two times a day (BID) | ORAL | Status: AC
Start: 2024-03-13 — End: ?

## 2024-03-12 MED ORDER — CLONAZEPAM 1 MG PO TABS
1.0000 mg | ORAL_TABLET | Freq: Every day | ORAL | 0 refills | Status: AC
Start: 2024-03-14 — End: 2024-04-13

## 2024-03-12 MED ORDER — CEFUROXIME AXETIL 500 MG PO TABS
500.0000 mg | ORAL_TABLET | Freq: Two times a day (BID) | ORAL | 0 refills | Status: AC
Start: 2024-03-13 — End: 2024-03-18

## 2024-03-12 MED ORDER — POLYETHYLENE GLYCOL 3350 17 G PO PACK
17.0000 g | PACK | Freq: Every day | ORAL | Status: AC
Start: 2024-03-14 — End: ?

## 2024-03-12 MED ORDER — GABAPENTIN 300 MG PO CAPS
300.0000 mg | ORAL_CAPSULE | Freq: Three times a day (TID) | ORAL | Status: DC
Start: 2024-03-12 — End: 2024-03-13
  Administered 2024-03-12 – 2024-03-13 (×2): 300 mg via ORAL
  Filled 2024-03-12 (×2): qty 1

## 2024-03-12 MED ORDER — LEVOTHYROXINE SODIUM 100 MCG PO TABS: 100.0000 ug | ORAL_TABLET | Freq: Every day | ORAL | Status: DC

## 2024-03-12 MED ORDER — VITAMIN D (ERGOCALCIFEROL) 1.25 MG (50000 UT) PO CAPS
50000.0000 [IU] | ORAL_CAPSULE | ORAL | 0 refills | Status: AC
Start: 2024-03-15 — End: 2024-04-12

## 2024-03-12 MED ORDER — OXYCODONE HCL 5 MG PO TABS
5.0000 mg | ORAL_TABLET | Freq: Every day | ORAL | 0 refills | Status: AC | PRN
Start: 2024-03-13 — End: 2024-03-20

## 2024-03-12 MED ORDER — ACETAMINOPHEN 500 MG PO TABS
1000.0000 mg | ORAL_TABLET | Freq: Three times a day (TID) | ORAL | Status: AC
Start: 2024-03-13 — End: ?

## 2024-03-12 MED ORDER — GABAPENTIN 300 MG PO CAPS
300.0000 mg | ORAL_CAPSULE | Freq: Three times a day (TID) | ORAL | 0 refills | Status: AC
Start: 2024-03-13 — End: 2024-04-12

## 2024-03-12 MED ORDER — PANTOPRAZOLE SODIUM 40 MG PO TBEC
40.0000 mg | DELAYED_RELEASE_TABLET | Freq: Every morning | ORAL | Status: AC
Start: 2024-03-14 — End: ?

## 2024-03-12 NOTE — PT Progress Note (Signed)
 Physical Therapy  Inpatient Rehabilitation Daily Progress Note    Patient Name:  Jennifer [name]       Medical Record Number: [id number]   Date of Birth: 1955-11-12  Sex: Female          Room/Bed:  M555/M555.01    Therapy Received:  Start Time: 1500   Stop Time: 1600  Total Therapy Minutes: 60    Rehabilitation Precautions/Restrictions:  Weight Bearing Status: RUE non weight bearing  Precaution Instructions Given to Patient: Yes  Other Precautions: fall, seizure, sling at all times, cognitive impairments, shoulder replacmenet precautions - see surgeons guide in pt's binder    Rehab Diagnosis: Seizures (CMS/HCC) [R56.9]     Subjective   Patient Report: When am I due for something stronger?  Patient/Caregiver Goals: Stop falling.  Pain Assessment:  Pain Assessment: Numeric Scale (0-10)  Pain Score: 8-severe pain  Pain Location: Shoulder  Pain Orientation: Right  Pain Descriptors: Aching  Pain Frequency: Increases with movement  Pain Intervention(s): Distraction, Rest    Objective     Interventions:     Pt cleared for PT session by RN Boby, present at bedside to administer medications and apply lidocaine  patch to pt for pain mgmt.  Pt was received in bed (+) sling donned to R UE, reluctant to participate in therapy.    PT to update pt regarding discharge plan, confirming pt on track for d/c tomorrow with oral antibiotics instead of IV.  Pt also confirming that she spoke with DME vendor earlier today  and is on track for hemiwalker delivery in advance of discharge.    After further discussion, pt agreeable to instruction in HEP in preparation for discharge but only at bedside.    PT instructed pt in HEP exercises to include bed level and standing leg raises/hip flexion, marching, sit <> stands.  PT also assisting pt to review surgeon-provided shoulder rehab protocol; increased time to explain role and rationale for various exercises to include grip strength, passive ROM, and new progression to deltoid  isometrics.    Also reviewed typical day of discharge procedures and the relative role of surgery protocol vs HEP vs home health PT to follow up with pt after discharge.    Pt in bed with needs met at end of session.      Assessments:    Education Documentation  Home exercise program, taught by Cindie Collar, PT at 03/12/2024  3:00 PM.  Learner: Patient  Readiness: Acceptance  Method: Explanation  Response: Verbalizes Understanding    Education Comments  No comments found.         Assessment & Plan   Pt benefiting from review of surgeon-provided shoulder rehab protocol to clarify that many exercises completed during rehab stay have followed dictated protocol.  Pt confirms that earlier phone conversation with DME vendor indicates hemiwalker to be delivered to pt in advance of discharge.  No PT barriers to d/c home as planned for tomorrow with her husband's support.    Plan:  Risks/Benefits/POC Discussed with Pt/Family: With patient/family  Treatment/Interventions: Exercise, Gait training, Stair training, Functional transfer training, LE strengthening/ROM, Endurance training, Cognitive reorientation, Patient/family training, Compensatory technique education    Recommendation:  Discharge Recommendation: Home with supervision, Home with home health PT  PT Therapy Recommendation: 5-6 days/week, 60-120 mins/day, 1:1 treatment, Group therapy  PT Estimated Length of Stay: 7-10 days from eval 10/8

## 2024-03-12 NOTE — Plan of Care (Addendum)
 Shift Note  Assume pt care 1900. RA, Rt shoulder sling in place. R shoulder pain 10/10 for movement. Oxy administered with good relief of pain. Neuro intact and moves all the fingers, +sensation. Denied any numbness or tingling.  Out of bed and used bedside commode with 1 x assist.   Problem: Pain Management  Goal: LTG: Patient will verbalize pain relief less than 2/10 with the use of pain medication and nonpharmacological pain measures such as deep breathing, relaxation techniques and repositioning 100% of the time  Outcome: Progressing     Problem: Risk of Infection  Goal: LTG: Patient incisions and abrasions will be free of any signs or symptoms of infection and will advance to healed by at least 50% by discharge date.  Outcome: Progressing

## 2024-03-12 NOTE — PT Progress Note (Signed)
 Physical Therapy  Inpatient Rehabilitation Discharge Summary    Patient Name:  Jennifer [name]       Medical Record Number: [id number]   Date of Birth: 01/25/1956  Sex: Female          Room/Bed:  M555/M555.01    Therapy Received:  Start Time: 0800   Stop Time: 0900  Total Therapy Minutes: 60    Rehabilitation Precautions/Restrictions:  Weight Bearing Status: RUE non weight bearing  Other Precautions: fall, seizure, sling at all times, cognitive impairments, shoulder replacmenet precautions - see surgeons guide in pt's binder    Rehab Diagnosis: Seizures (CMS/HCC) [R56.9]     History of Present Illness: Per H&P: This 68 y.o. year old right hand-dominant female with PMH of epilepsy, HLD, hypothyroidism, DVT, s/p IVD filter, migraines, SVT, iron deficiency anemia, knee and hip replacements, colon resection, lumbar laminectomy, adverse anesthesia effects, who presented to [facility] on 02/28/24 after mechanical fall in shower.  Patient had recent right shoulder replacement on 02/26/24 (NWB to RUE with sling) and accidentally overdosed on her medications. Patient stated she felt like she might have been having aura that she sees prior to her having a seizure.     CT head showed no acute abnormalities. 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 Soft tissue gas along the right shoulder joint is favored to be postsurgical related. No acute displaced fracture or malalignment. Xray of right arm showed no acute findings.     Neurology was consulted. Ataxia attributed to due to medication overdosage versus other causes. Patient also is on clonazepam  1 mg 3 times daily and gabapentin  300 mg 4 times daily  MRI of the brain negative for acute abnormality.   Continue Keppra  1500 mg twice daily per home dosage  Continue lacosamide  200 mg 2 times daily per home dosage     Patient presents with unsteady gait, impaired balance, decreased strength, impaired functional mobility, decreased  activity tolerance, orthopedic restrictions, and decreased ROM s/p R reverse TSA with 3 subsequent falls at home since 10/1.  The patient has been working well with therapy services to include PT and OT to address functional mobility, self care deficit, and pain.  The patient would benefit from an intensive level of rehab along with close medical management of multiple medical conditions and rehab MD to drive the rehab process.  The patient is medically stable for IRF level of care.    Subjective   Patient Report: I need to go home  Patient/Caregiver Goals: Stop falling.  Pain Assessment:  Pain Assessment: No/denies pain    Objective    Vitals:        Interventions:     Handoff from RN who reports pt is appropriate for therapy. Pt received sitting up in bed. Agreeable to therapy. Session focused on assessing functional mobility via CARE tool, see below.    Reviewed HEP including seated LAQ, marches, and standing heel raises, mini squats, and marches - unable to print handout due to printer malfunction. PT to deliver to pt at end of day.     Reviewed waiting for call from [employer] for hemiwalker.     Pt dependently taken back to room. Left with all immediate needs met.       Mobility Functional Status:   Current   Status Current Status Discharge Goal   Functional Area: Care Score:  Comments:    Roll Left and Right 6 To L, did not roll  to LR due to sling Independent (To L, will not roll to R due ot precautions)   Sit to Lying 6 flat bed no rails Independent   Lying to Sitting on Side of Bed 6 flat bed no rails Independent   Sit to Stand 6 to hemiwalker Independent   Chair/Bed-to-Chair Transfer 6 stand pivot no AD Independent   Car Transfer 6 ambulaotry transfer with HW Independent   Walk 10 Feet 6 HW Independent   Walk 50 Feet with Two Turns 6 HW Independent   Walk 10 Feet on Uneven Surface 6 HW Independent   Walk 150 Feet 4 HW, VC for increasing B step length and speed Independent   1 Step (Curb) 6 HW  Independent   4 Steps 6 LHR per home set up side stepping when descending Independent   12 Steps 4 LHR per home set up side stepping when descending Supervision or touching assistance   Wheel 50 Feet with Two Turns        Wheel 150 Feet          Cognition: Mild deficits in LTM and processing.     Encounter Problems       Encounter Problems (Active)       PT - General and Outcome Measures       LTG: Patient will complete supine to/from sit transfer independently to maximize independence with bed mobility/decrease burden of care (Completed)       Start:  03/04/24    Expected End:  03/13/24    Resolved:  03/12/24         LTG: Patient will complete independently to maximize independence and prevent falls. (Completed)       Start:  03/04/24    Expected End:  03/13/24    Resolved:  03/12/24         LTG: Patient will walk 50 ft independently with LRAD to access areas of their home and community/decrease burden of care (Completed)       Start:  03/04/24    Expected End:  03/13/24    Resolved:  03/12/24         LTG: Patient will ascend/descend 12 stairs with single HR and supervision to safely and independently access their home/community (Completed)       Start:  03/04/24    Expected End:  03/13/24    Resolved:  03/12/24         LTG: Patient will ascend/descend 2 stairs with no HR and SPV to safely and independently access their home/community (Completed)       Start:  03/04/24    Expected End:  03/13/24    Resolved:  03/12/24         LTG: Patient will improve score on Berg Balance Assessment by 7 points in order to demonstrate decrease risk for falls as compared to initial Berg Balance Assessment score (Adequate for Discharge)       Start:  03/04/24    Expected End:  03/13/24         Goal Note       Did not assess initially due to high levels of pain                      Education Documentation  Wheelchair management/mobility, taught by Fernando Quarry, PT at 03/12/2024  8:00 AM.  Learner: Patient  Readiness:  Acceptance  Method: Explanation, Demonstration  Response: Verbalizes Understanding, Demonstrated Understanding    Safety issues and interventions, taught  by Fernando Quarry, PT at 03/12/2024  8:00 AM.  Learner: Patient  Readiness: Acceptance  Method: Explanation, Demonstration  Response: Merrell Understanding, Demonstrated Understanding    Rehab techniques/procedure, taught by Fernando Quarry, PT at 03/12/2024  8:00 AM.  Learner: Patient  Readiness: Acceptance  Method: Explanation, Demonstration  Response: Merrell Understanding, Demonstrated Understanding    Orthotics/prosthetics, taught by Fernando Quarry, PT at 03/12/2024  8:00 AM.  Learner: Patient  Readiness: Acceptance  Method: Explanation, Demonstration  Response: Merrell Understanding, Demonstrated Understanding    Home management activities, taught by Fernando Quarry, PT at 03/12/2024  8:00 AM.  Learner: Patient  Readiness: Acceptance  Method: Explanation, Demonstration  Response: Merrell Understanding, Demonstrated Understanding    Home exercise program, taught by Fernando Quarry, PT at 03/12/2024  8:00 AM.  Learner: Patient  Readiness: Acceptance  Method: Explanation, Demonstration  Response: Merrell Understanding, Demonstrated Understanding    Equipment, taught by Fernando Quarry, PT at 03/12/2024  8:00 AM.  Learner: Patient  Readiness: Acceptance  Method: Explanation, Demonstration  Response: Merrell Understanding, Demonstrated Understanding    Driving, taught by Fernando Quarry, PT at 03/12/2024  8:00 AM.  Learner: Patient  Readiness: Acceptance  Method: Explanation, Demonstration  Response: Merrell Understanding, Demonstrated Understanding    Community resources, taught by Fernando Quarry, PT at 03/12/2024  8:00 AM.  Learner: Patient  Readiness: Acceptance  Method: Explanation, Demonstration  Response: Verbalizes Understanding, Demonstrated Understanding    Cognitive function, taught by Fernando Quarry, PT at 03/12/2024  8:00  AM.  Learner: Patient  Readiness: Acceptance  Method: Explanation, Demonstration  Response: Verbalizes Understanding, Demonstrated Understanding    CAR transfers, taught by Fernando Quarry, PT at 03/12/2024  8:00 AM.  Learner: Patient  Readiness: Acceptance  Method: Explanation, Demonstration  Response: Merrell Understanding, Demonstrated Understanding    Precautions, taught by Fernando Quarry, PT at 03/12/2024  8:00 AM.  Learner: Patient  Readiness: Acceptance  Method: Explanation, Demonstration  Response: Merrell Understanding, Demonstrated Understanding    Plan of care, taught by Fernando Quarry, PT at 03/12/2024  8:00 AM.  Learner: Patient  Readiness: Acceptance  Method: Explanation, Demonstration  Response: Merrell Understanding, Demonstrated Understanding    Pain management, taught by Fernando Quarry, PT at 03/12/2024  8:00 AM.  Learner: Patient  Readiness: Acceptance  Method: Explanation, Demonstration  Response: Merrell Understanding, Demonstrated Understanding    Functional transfers/mobility, taught by Fernando Quarry, PT at 03/12/2024  8:00 AM.  Learner: Patient  Readiness: Acceptance  Method: Explanation, Demonstration  Response: Merrell Understanding, Demonstrated Understanding    Fall prevention/balance training, taught by Fernando Quarry, PT at 03/12/2024  8:00 AM.  Learner: Patient  Readiness: Acceptance  Method: Explanation, Demonstration  Response: Verbalizes Understanding, Demonstrated Understanding    Education Comments  No comments found.        Assessment & Plan   Summary of Progress and Current Status:     Pt has demonstrated good progress during inpatient rehab stay progressing to completing functional mobility at a grossly mod I/SPV level. Due to varied levels of pain, recommend CGA for completion of stairs at home and initial SPV for all mobility. Family training and a family meeting were completed with pt's husband who demonstrated good understanding of recommendations. A  hemiwalker was ordered and delivered to pt for use at home. Pt will continue to benefit from home health physical therapy services  to decrease burden of care and increase independence.       Plan:  Risks/Benefits/POC Discussed with Pt/Family: With patient/family  Treatment/Interventions: Exercise, Gait training,  Stair training, Functional transfer training, LE strengthening/ROM, Endurance training, Cognitive reorientation, Patient/family training, Compensatory technique education    Recommendation:  Discharge Recommendation: Home with supervision, Home with home health PT  DME Recommended for Discharge: Hemi walker, Quad cane (hemiwalker vs quad cane)  PT Therapy Recommendation: 5-6 days/week, 60-120 mins/day, 1:1 treatment, Group therapy  PT Estimated Length of Stay: 7-10 days from eval 10/8

## 2024-03-12 NOTE — Progress Notes (Signed)
 Case Management Progress Note:  SW spoke with patient's spouse regarding upcoming discharge home. Patient's spouse confirmed that he will  be available to pick patient up for discharge around noon. SW advised will review discharge summary with patient and patient's spouse tomorrow prior to discharge.  No further questions or concerns at time of check in.     Montie Levy, LMSW, ACMA-SW  Social Work Case Manager  [facility]   Phone: [phone]  Fax: [phone]  [email or web address]

## 2024-03-12 NOTE — Plan of Care (Signed)
 Problem: Pain Management  Goal: LTG: Patient will verbalize pain relief less than 2/10 with the use of pain medication and nonpharmacological pain measures such as deep breathing, relaxation techniques and repositioning 100% of the time  Outcome: Progressing  Note: Patient is taking scheduled pain medication and also requesting for PRN when need in order to help with her pain with pain level reduced from 8/10 to 2/10     Problem: Skin Wound Management  Goal: LTG: Patient will show no evidence of skin breakdown during hospitalization.  Outcome: Progressing  Note: Patient continue to reposition with staff assist and offload weight while in w/chair in order to prevent skin break     Problem: Risk of Infection  Goal: LTG: Patient incisions and abrasions will be free of any signs or symptoms of infection and will advance to healed by at least 50% by discharge date.  Outcome: Progressing  Note: Patient's surgical incision in clean, dry and intact with no S/S or infection noted on site.     Problem: Safety Risk  Goal: LTG: Patient will acknowledge limitations and call for assistance before transferring in and out of bed and with ambulation 100% of the tim  Outcome: Progressing  Note: Patient is calling appropriately with use of her call light 100% of the time for staff assist prior to all transfers in order to prevent falls with injury

## 2024-03-12 NOTE — Progress Notes (Signed)
 PHYSICAL MEDICINE AND REHABILITATION  PROGRESS NOTE -- FACE-TO-FACE ENCOUNTER    Date Time: 03/12/24 4:59 PM  Patient Name: Jennifer [name], Jennifer [name]    Admission date:  03/03/2024  (LOS: 9 days)    Subjective:     Patient denies dysuria, antibiotics is helping.  Patient denies fever and chills.  Patient with no complaint chest pain or abdominal pain.  Patient had BM yesterday, denies dysuria.  Patient does not wish to use lidocaine  patch, or Voltaren  gel.  Tylenol  help with the pain.    Functional Status:     Mobility Functional Status:    Current   Status Current Status Discharge Goal   Functional Area: Care Score:  Comments:     Roll Left and Right 6 To L, did not roll to LR due to sling Independent (To L, will not roll to R due ot precautions)   Sit to Lying 6 flat bed no rails Independent   Lying to Sitting on Side of Bed 6 flat bed no rails Independent   Sit to Stand 6 to hemiwalker Independent   Chair/Bed-to-Chair Transfer 6 stand pivot no AD Independent   Car Transfer 6 ambulaotry transfer with HW Independent   Walk 10 Feet 6 HW Independent   Walk 50 Feet with Two Turns 6 HW Independent   Walk 10 Feet on Uneven Surface 6 HW Independent   Walk 150 Feet 4 HW, VC for increasing B step length and speed Independent   1 Step (Curb) 6 HW Independent   4 Steps 6 LHR per home set up side stepping when descending Independent   12 Steps 4 LHR per home set up side stepping when descending Supervision or touching assistance       Medications:   Medication reviewed by me:     Scheduled Meds: PRN Meds:   acetaminophen , 1,000 mg, Oral, Q8H  aspirin  EC, 325 mg, Oral, Daily  cefuroxime, 500 mg, Oral, Q12H SCH  clonazePAM , 1 mg, Oral, Daily  docusate sodium , 100 mg, Oral, Daily  gabapentin , 200 mg, Oral, Q8H  heparin (porcine), 5,000 Units, Subcutaneous, Q8H SCH  lacosamide , 200 mg, Oral, BID  levETIRAcetam , 1,500 mg, Oral, Q12H SCH  levothyroxine , 100 mcg, Oral, Daily at 0600  lidocaine , 1 patch, Transdermal, Q24H  metoprolol   succinate XL, 25 mg, Oral, Daily  pantoprazole, 40 mg, Oral, QAM AC  polyethylene glycol, 17 g, Oral, Daily  rosuvastatin , 5 mg, Oral, QHS  sodium chloride , 250 mL, Intravenous, Once  triamterene -hydrochlorothiazide , 1 tablet, Oral, Daily  vitamin D  (ergocalciferol ), 50,000 Units, Oral, Weekly        Continuous Infusions:   lactulose, 10 g, Daily PRN  naloxone , 0.4 mg, PRN  ondansetron , 4 mg, Q8H PRN   Or  ondansetron , 4 mg, Q8H PRN  oxyCODONE , 10 mg, Q4H PRN  oxyCODONE , 5 mg, Q4H PRN  polyethylene glycol, 17 g, Daily PRN            Medication Review  [name] complete drug regimen review was completed: Yes  Were any drug issues found during review?: No       Review of Systems:   [name] comprehensive review of systems was: No fevers, chills, nausea, vomiting, chest pain, shortness of breath, cough, headache, double vision.  All others negative.    Physical Exam:     Vitals:    03/11/24 1608 03/12/24 0533 03/12/24 0915 03/12/24 1000   BP: 110/62 109/57 95/57 100/57   Pulse: 73 64 (!) 58    Resp: 18 18  Temp: 98.6 F (37 C) 98 F (36.7 C) 98.2 F (36.8 C)    TempSrc: Oral Oral Oral    SpO2: 98% 98% 97%    Weight:       Height:           Intake and Output Summary (Last 24 hours) at Date Time    Intake/Output Summary (Last 24 hours) at 03/12/2024 1659  Last data filed at 03/12/2024 1300  Gross per 24 hour   Intake 1190 ml   Output 1 ml   Net 1189 ml     P.O.: 240 mL (03/12/24 1300)     Urine: 1 mL (03/11/24 2200)  Bladder Scan Volume (mL): 105 mL (03/06/24 1600)  Intermittent/Straight Cath (mL): 500 mL (03/04/24 0600)     Alert and NAD  Cardiac: regular rate and rhythm  Chest / Lungs:  Clear to auscultation.  Abdomen:  + bowel sounds, Soft, non-tender, non-distended.  Extremities: no calf tenderness. No pitting edema of BLE.    Labs:   No results for input(s): GLUCOSEWHOLE in the last 24 hours.    Recent Labs   Lab 03/11/24  0524 03/09/24  0648 03/06/24  0610   WBC 6.18 7.30 6.79   Hemoglobin 9.6* 9.6* 10.2*   Hematocrit  29.1* 29.6* 31.3*   Platelet Count 333 336 357*                                  Rads:   Radiological Procedure reviewed.  No results found.        Assessment and Plan:     68 y/o female with PMH of epilepsy, HLD, hypothyroidism, DVT, s/p IVD filter, migraines, SVT, iron deficiency anemia, knee and hip replacements, colon resection, lumbar laminectomy, adverse anesthesia effects,  s/p fall after accidental OD on her meds, she had recent R shoulder replacement on 02/26/24.     #Rehab  - PT, OT with ADL, mobility, and ambulation with AD  - TR  - Fall, seizures, NWB RUE, and sling  -We had [name] team conference today  led by me and concur with team findings and recommendations. Please refer to the note for details.   10/10: IPOC completed today .  10/15: We had [name] team conference today  led by me and concur with team findings and recommendations. Please refer to the note for details.  Also had family meeting with patient, her husband, CM, therapist, nursing and updated on the medical and functional status.     #Ataxia, accidental OD with meds  #s/p fall  -Monitor, esp with use of pain meds  -Therapy with gait training, stability  -Per neurology, ataxia attributed to medication OD     #H/o seizure d/o  -Poss seizures  -CT, MRI of the brain with no acute abn  -Continue with seizure meds: Keppra , Lacosamide ,   -On low dose of clonazepam   -Seizure precaution  -Neurology f/u  10/10: Per medicine, decrease gabapentin , after discussing with patient's husband who is [name] pharmacist, request reducing dosage, neurology consulted per medicine.  10/14: Patient, her husband, agree with continue with lower dose of gabapentin , eventually taper further, follow-up with neurology.  10/15: After discussing with family meeting will continue with current medication and to follow-up with neurologist to adjust meds.  10/16: After discussing with patient, Dr. Meliton, possibly increased gabapentin  due to neuropathic symptoms of both lower leg.  Dr. Silvis  to discuss with patient's husband regarding change  in meds.     #S/p R shoulder replacement  -NWB RUE, on sling  -f/u with Dr. Barnabas  -shoulder x ray with postop changes, no displaced or fx  -Pt has f/u with orthopedic NP this Friday for postop f/u.  10/9: Per patient's husband, rescheduled for surgery follow-up with Dr. Brendon on 03/10/24 for postop follow-up.  10/13: LOA tomorrow for f/u with surgeon for postop f/u.  10/14: LOA today  and f/u with surgeon.  10/15: Started on protocol for shoulder surgery, and Voltaren  gel, lidocaine  patch and Tylenol  1000 mg every 8 hours for shoulder pain.  Patient and her husband agree with the plan.  Consider taking less narcotics for pain.  10/16: Patient refuses Voltaren  gel, lidocaine  patch, Tylenol  is helping with the pain.     #HLD  - On Crestor      #Hypothyroidism  -On synthroid      #H/o iron def anemia  -CBC in am  -Est baseline Hgb 12.0 g/dl  -H/H 89.4/67.6 today , interm f/u. Postop anemia.  10/9: F/u CBC in am.  10/10: H/H 10.2/31.3 today , f/u CBC Monday.  10/13: H/H 9.6/29.6 today , f/u in 2 days to monitor anemia.  10/15: H/H 9.6/29.1 today , stable, interm f/u.     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 No leukocytosis.  10/14: UA with mod pyuria, rec medicine f/u for poss abx tx. F/u c/s.  10/15: Urine growing E. coli, started on Ceftin per medicine. F/u C/S.     #Dispo  -Plan for d/c on 03/13/24 with HH f/u.       Problem List[1]    Continue comprehensive and intensive inpatient rehab program, including:   Physical therapy 60-120 min daily, 5-6 times per week, Occupational therapy 60-120 min daily, 5-6 times per week, TR,Case management and Rehabilitation nursing  Greater than 65 minutes was spent with patient regarding family meeting, treatment plan, discharge planning with the team.                 Acute  blood loss anemia requiring monitoring     Highest Value Lowest Value Decrease   Hemoglobin 13.2 g/dl  0/75/7974  8:71 PM 9.6 g/dl  89/84/7974  4:75 AM -3.6 g/dl   Hematocrit 59%  0/75/7974  1:28 PM  29.1%  03/11/2024  5:24 AM -10.9%           Signed by: Ozell GORMAN Lawyer, MD MD    [facility]    If there are questions or concerns about the content of this note or information contained within the body of this dictation they should be addressed directly with the author for clarification.                         [1]   Patient Active Problem List  Diagnosis    Primary osteoarthritis of left knee    Mass of right breast, unspecified quadrant    Ventricular tachyarrhythmia (CMS/HCC)    Hypertriglyceridemia    Hypothyroidism, unspecified type    DDD (degenerative disc disease), lumbosacral    Prediabetes    Class 3 severe obesity with serious comorbidity and body mass index (BMI) of 40.0 to 44.9 in adult, unspecified obesity  type (CMS/HCC)    Seizure disorder (CMS/HCC)    SVT (supraventricular tachycardia)    History of colon resection    History of cholecystectomy    History of maternal deep vein thrombosis (DVT)    Morbid obesity with BMI of 45.0-49.9, adult (CMS/HCC)    Morbid obesity (CMS/HCC)    Seizures (CMS/HCC)

## 2024-03-12 NOTE — OT Progress Note (Addendum)
 Occupational Therapy  Inpatient Rehabilitation Discharge Summary    Patient Name:  Jennifer [name]       Medical Record Number: [id number]   Date of Birth: 09-24-1955  Sex: Female          Room/Bed:  M555/M555.01    Therapy Received:  Start Time: 1000   Stop Time: 1200  Total Therapy Minutes: 120    Rehabilitation Precautions/Restrictions:  Weight Bearing Status: RUE non weight bearing  Other Precautions: fall, seizure, sling at all times, cognitive impairments, shoulder replacmenet precautions - see surgeons guide in pt's binder    Rehab Diagnosis: Seizures (CMS/HCC) [R56.9]     History of Present Illness: Per H&P: This 68 y.o. year old right hand-dominant female with PMH of epilepsy, HLD, hypothyroidism, DVT, s/p IVD filter, migraines, SVT, iron deficiency anemia, knee and hip replacements, colon resection, lumbar laminectomy, adverse anesthesia effects, who presented to [facility] on 02/28/24 after mechanical fall in shower.  Patient had recent right shoulder replacement on 02/26/24 (NWB to RUE with sling) and accidentally overdosed on her medications. Patient stated she felt like she might have been having aura that she sees prior to her having a seizure.     CT head showed no acute abnormalities. 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 Soft tissue gas along the right shoulder joint is favored to be postsurgical related. No acute displaced fracture or malalignment. Xray of right arm showed no acute findings.     Neurology was consulted. Ataxia attributed to due to medication overdosage versus other causes. Patient also is on clonazepam  1 mg 3 times daily and gabapentin  300 mg 4 times daily  MRI of the brain negative for acute abnormality.   Continue Keppra  1500 mg twice daily per home dosage  Continue lacosamide  200 mg 2 times daily per home dosage     Patient presents with unsteady gait, impaired balance, decreased strength, impaired functional mobility,  decreased activity tolerance, orthopedic restrictions, and decreased ROM s/p R reverse TSA with 3 subsequent falls at home since 10/1.  The patient has been working well with therapy services to include PT and OT to address functional mobility, self care deficit, and pain.  The patient would benefit from an intensive level of rehab along with close medical management of multiple medical conditions and rehab MD to drive the rehab process.  The patient is medically stable for IRF level of care.       Subjective   Patient Report: Im not in pain now  Patient/Caregiver Goals: Stop falling.  Pain Assessment:  Pain Assessment: No/denies pain    Objective   Vitals:  BP: 100/57      Interventions: Handoff completed with RN, Pt cleared for therapy. Pt met in shower, agreeable to therapy and agreeable to d/c shower this date.    Session focused on shower routine to promote ind and safety c ADLs. Pt able to complete short ambulation to bathroom c RW. Assist for waterproofing R incision. Able to complete shower c assist for alternating sit to stand to wash all body parts. Able to complete all aspects of morning routine c mod-ind assistance. See below  Reviewed binder with Pt to discuss protocol from surgeon.  Educated Pt on completing exercises (grip strengthening, using putty, shoulder shrugs and circles, pendulums)      Dr Rea and Dr Ginnie present for extensive conversation regarding medication does and changes in preparation for d/c.      Attempted  to use bathroom before transitioning to bed. Not able to void.      Pt left in bed c  alarm on. Handoff to RN, all needs met.        Self Care Functional Status:   Current Status Current Status Discharge Goal   Functional Area: Care Score: Comments:    Eating 6 able to manage all items with increased time and using R hand as a stablizer Independent   Oral Hygiene 6 able to brush teeth with L hand, able to use R hand to manipulate toothpaste Independent   Toileting Hygiene 3  assist to wash pericare d/t decreased ROM in RUE Independent   Shower/Bathe Self 3 assist to wash buttocks in standing with steadying assist Independent   Upper Body Dressing 3 assist to thread R arm able to assist c L arm to don up arm and over head, extra time needed to don over head and decend over torso Independent   Lower Body Dressing 3 assist to thread and manage pants up legs, able to assist c L arm Independent   Putting On/Taking Off Footwear 3 assist to thread on socks Independent     Mobility Functional Status:   Current   Status Current Status Discharge Goal   Functional Area: Care Score: Comments:    Toilet Transfer 6 able to complete short ambulation c hemiwalker to [facility] Independent   Picking Up Object 6 able to reach c L arm Independent     Cognition: AxOx4    Encounter Problems       Encounter Problems (Active)       Dressing Lower Body       LTG: Patient will dress lower body with Mod I using AE prn in order to maximize independence with basic self care. (Adequate for Discharge)       Start:  03/04/24    Expected End:  03/13/24            STG: Patient will dress lower body with min assistance using AE prn and min verbal cues in order to maximize independence with basic self care. (Completed)       Start:  03/04/24    Expected End:  03/13/24    Resolved:  03/12/24         LTG: Pt will incorporate adaptive/compensatory dressing technique of lower extremities with min verbal cues to maximize safety with basic self care. (Adequate for Discharge)       Start:  03/04/24    Expected End:  03/13/24               Dressing Upper Extremities       LTG: Patient will complete UB dressing with independence in order to maximize independence with basic self-care tasks. (Adequate for Discharge)       Start:  03/04/24    Expected End:  03/13/24            STG: Patient will complete UB dressing with min assistance and  min verbal cues in order to maximize independence with basic self-care tasks. (Progressing)        Start:  03/04/24    Expected End:  03/13/24               Eating       LTG: Pt will demonstrate use of compensatory strategies/AE in order to feed self with independence d/t affected dominant extremity.  (Completed)       Start:  03/04/24    Expected  End:  03/13/24    Resolved:  03/12/24         STG: Pt will demonstrate use of compensatory strategies/AE in order to feed self with min assistance and  min verbal cues. (Progressing)       Start:  03/04/24    Expected End:  03/13/24               IADLs       LTG: Pt will utilize energy conservation strategies during IADL tasks with no cues in order to maximize safety and reduce falls in preparation for a safe discharge. (Adequate for Discharge)       Start:  03/04/24    Expected End:  03/13/24            STG: Pt will utilize energy conservation strategies during IADL tasks with min verbal cues in order to maximize safety and reduce falls in preparation for a safe discharge. (Adequate for Discharge)       Start:  03/04/24    Expected End:  03/13/24               Toilet Hygiene       LTG: Patient will perform toilet hygiene with independence using recommended equipment for safety. (Adequate for Discharge)       Start:  03/04/24    Expected End:  03/13/24            STG: Patient will perform toilet hygiene with min assistance and min verbal cues using recommended equipment for safety. (Adequate for Discharge)       Start:  03/04/24    Expected End:  03/13/24                   Education Documentation  Safety issues and interventions, taught by Tristen Luce, OT at 03/12/2024 11:32 AM.  Learner: Patient  Readiness: Acceptance  Method: Explanation, Teach Back  Response: Merrell Understanding, Demonstrated Understanding    Rehab techniques/procedure, taught by Hines Kloss, OT at 03/12/2024 11:32 AM.  Learner: Patient  Readiness: Acceptance  Method: Explanation, Teach Back  Response: Verbalizes Understanding, Demonstrated Understanding    Bowel and bladder management,  taught by Analyn Matusek, OT at 03/12/2024 11:32 AM.  Learner: Patient  Readiness: Acceptance  Method: Explanation, Teach Back  Response: Verbalizes Understanding, Demonstrated Understanding    Precautions, taught by Maor Meckel, OT at 03/12/2024 11:32 AM.  Learner: Patient  Readiness: Acceptance  Method: Explanation, Teach Back  Response: Verbalizes Understanding, Demonstrated Understanding    Plan of care, taught by Omah Dewalt, OT at 03/12/2024 11:32 AM.  Learner: Patient  Readiness: Acceptance  Method: Explanation, Teach Back  Response: Merrell Understanding, Demonstrated Understanding    Fall prevention/balance training, taught by Shalaina Guardiola, OT at 03/12/2024 11:32 AM.  Learner: Patient  Readiness: Acceptance  Method: Explanation, Teach Back  Response: Merrell Understanding, Demonstrated Understanding    ADL retraining, taught by Harli Engelken, OT at 03/12/2024 11:32 AM.  Learner: Patient  Readiness: Acceptance  Method: Explanation, Teach Back  Response: Verbalizes Understanding, Demonstrated Understanding    Education Comments  No comments found.        Assessment & Plan   Summary of Progress and Current Status: Patient is a 68 y/o female admitted to [facility] inpatient rehab s/p fall in shower and recent R shoulder replacement. Pt has made good progress during the inpatient rehab stay.  Currently, pt is functioning at grossly modA-IND for ADL's.  Pt is functioning at IND for functional transfers/mobility.  Pt continues to present with deficits including NWB precautions and reduced mobility of R arm, and decreased balance and strength which impact patient's independence during ADL's/IADL's and functional mobility.      A family training did occur and pt and family were educated on progress and what to expect upon discharge.  Pt and husband were educated on recommendations for DME and A.E., safe ADL function, safe transfer techniques, safe positioning, and home modifications.  They were  provided with handouts to purchase [facility] and toilet wand, reacher. She already owns a Sports administrator and husband demonstrates good ability to set up home safety.     Pt will discharge to home with husband providing recommended assistance/supervision. Pt will benefit and has been recommended to continue with home health occupational therapy in order to maximize function, safety, and independence.        Plan:  Risks/Benefits/POC Discussed with Pt/Family: With patient  Treatment Interventions: ADL retraining, Functional transfer training, Endurance training, Patient/Family training, Equipment eval/education, Compensatory technique education    Recommendation:  Discharge Recommendation: Home with home health OT  DME Recommended for Discharge: Hemi walker, Sling, Long-handled sponge, Sock aid, Reacher, BSC  OT Estimated Length of Stay: 7-10 days
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
## 2024-03-13 NOTE — Progress Notes (Signed)
 Therapeutic [facility]creation  Inpatient Rehabilitation Discharge Summary    Patient Name:  Jennifer [name]       Medical Record Number: [id number]   Date of Birth: 09/01/1955  Sex: Female          Room/Bed:  M555/M555.01      Rehabilitation Precautions/Restrictions:  Weight Bearing Status: RUE non weight bearing  Precaution Instructions Given to Patient: Yes  Other Precautions: fall, seizure, sling at all times, cognitive impairments, shoulder replacmenet precautions - see surgeons guide in pt's binder    Rehab Diagnosis: Seizures (CMS/HCC) [R56.9]       Subjective   Patient/Caregiver Goals: Stop falling.    Objective   Encounter Problems       Encounter Problems (Resolved)       Endurance/Activity Tolerance       Pt will improve strength and endurance as demonstrated through the ability to perform and tolerate leisure activities upon discharge.  (Completed)       Start:  03/04/24    Expected End:  03/13/24    Resolved:  03/13/24            Leisure Skills       Pt will participate in a leisure activity of choice with mod I upon discharge.  (Completed)       Start:  03/04/24    Expected End:  03/13/24    Resolved:  03/13/24                Assessment & Plan   Summary of Progress and Current Status: Pt is at a mod I level for leisure activities. D/c from TR services.

## 2024-03-13 NOTE — Plan of Care (Signed)
 Problem: Pain Management  Goal: LTG: Patient will verbalize pain relief less than 2/10 with the use of pain medication and nonpharmacological pain measures such as deep breathing, relaxation techniques and repositioning 100% of the time  Outcome: Progressing     Problem: Skin Wound Management  Goal: LTG: Patient will show no evidence of skin breakdown during hospitalization.  Outcome: Progressing     Problem: Safety Risk  Goal: LTG: Patient will acknowledge limitations and call for assistance before transferring in and out of bed and with ambulation 100% of the tim  Outcome: Progressing     Patient discharged in stable condition, alert, oriented to person, place, time, and situation. Discharge education provided to patient and primary caregiver, covering the following topics: fall and safety precautions, medication administration with emphasis on dosing schedule and monitoring for adverse drug reactions, blood pressure self-monitoring, early recognition of seizures,  indications for emergency intervention (when to call 911),  wound care , skin assessment, and signs and symptoms of infection (erythema, purulent drainage, fever). Written materials provided and thoroughly explained; patient and family demonstrated understanding through return verbalization and questions answered appropriately. Follow-up appointment with primary care provider advised. Attending physician informed and performed discharge rounds prior to patient departure. Assistance provided with packing personal belongings; belongings double-checked, no items left in room. Patient and family expressed satisfaction and gratitude for care received in rehabilitation unit. Patient safely escorted to lobby by Clinical Technician; room vacated at approximately at 1300.

## 2024-03-13 NOTE — Progress Notes (Signed)
 Case Management  Inpatient Rehabilitation Discharge Summary    Patient Name:  Jennifer [name]       Date of Birth: August 12, 1955  Sex: Female    Rehab Diagnosis: Seizures (CMS/HCC) [R56.9]      Medical History[1]     Discharge Disposition  Patient preference/choice provided?: (P) Yes  Physical Discharge Disposition: (P) Home, Home Health  Name of Home Health Agency Placement: (P) [facility]  Mode of Transportation: (P) Car  Patient/Family/POA notified of transfer plan: (P) Yes  Patient agreeable to discharge plan/expected d/c date?: (P) Yes  Family/POA agreeable to discharge plan/expected d/c date?: (P) Yes  Bedside nurse notified of transport plan?: (P) Yes    Outpatient Services  Home Health: (P) Skilled Nursing, Other (comment) (PT/OT)    Medicare Checklist  Is this a Medicare patient?: (P) Yes  If LOS 3 days or greater, did patient received 2nd IMM Letter?: (P) Yes    Met with patient at bedside. Explained Important Message from Medicare About Your Rights form. Patient stated understanding and signed form. IMM scanned to Medical Records to be placed in EPIC.      SW provided patient with discharge summary including home health information, recommended follow up appointments. SW provided personal care aide agencies and [employer] resources. Patient's spouse will be available to pick her up for discharge.        03/13/24 1109   Medicare Checklist   Is this a Medicare patient? Yes   Patient received 1st IMM Letter? Yes   3 midnight inpatient qualifying stay (SNF only) Yes   If LOS 3 days or greater, did patient received 2nd IMM Letter? Yes   Date of 2nd IMM Letter 03/13/24   Time of 2nd IMM letter 1000   Discharge Disposition   Patient preference/choice provided? Yes   Physical Discharge Disposition Home, Home Health   Name of Home Health Agency Placement [facility]   Mode of Transportation Car   Patient/Family/POA notified of transfer plan Yes   Patient  agreeable to discharge plan/expected d/c date? Yes   Family/POA agreeable to discharge plan/expected d/c date? Yes   Bedside nurse notified of transport plan? Yes   Outpatient Services   Home Health Skilled Nursing;Other (comment)  (PT/OT)   CM Interventions   Follow up appointment scheduled?(For PNA, COPD, MI) Yes   Referral made for home health RN visit? Yes   Multidisciplinary rounds/family meeting before d/c? Yes         Montie Levy, LMSW, ACMA-SW  Social Work Case Manager  [facility]   Phone: [phone]  Fax: [phone]  [email or web address]                [1]   Past Medical History:  Diagnosis Date    Abnormal vision     glasses    Arrhythmia 2018    SVT (had ablation)    Arthritis     Complication of anesthesia     urinary retention with every joint replacement, BP drop with spinal X1, another time spinal didn't work    Convulsions (CMS/HCC)     epilepsy, last seizure 2010    Deep venous thrombosis of distal lower extremity (CMS/HCC) 2018    after cholecystectomy; left leg    Diverticulitis     takes miralax     Encounter for blood transfusion 2018    Gastric ulceration 2018    GI bleed with requiring 4 units PRBCs per pt  Hyperlipidemia     Hypothyroidism     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

## 2024-03-13 NOTE — Plan of Care (Signed)
 Rehabilitation Nursing Discharge Assessment and Care Plan    Patient Name: Jennifer [name]       Medical Record Number: [id number]   Date of Birth: 09/10/1955  Rehab Diagnosis: Seizures (CMS/HCC) [R56.9]    Nutrition:  Diet Type: Regular, Thin (IDDSI level 0)  Feeding Route: Other (Comment) (PO)  Feeding: Able to feed self  Percent Meal Consumed (%): 0%  Nighttime Snack (HS) Given: No     Integumentary:  Integumentary (WDL): All findings assessed as WDL except as noted  General Skin Color: Appropriate for ethnicity  Skin Temp: Warm, Dry  Skin Assessment: Blanchable Redness  Image: Images linked  Bruising Skin Location: scattered  Scar Location: scattered  Blanchable Redness Location: Bil heels  Treatment: Zinc  - oxide paste  Surgical Incision Location: R shoulder  Treatment:  (Aquacel dsg in place)  Braden Scale: Sensory Perceptions: No impairment  Moisture: Rarely moist  Activity: Chairfast  Mobility: Slightly limited  Nutrition: Adequate  Friction and Shear: No apparent problem  Braden Scale Score: 19  Braden Skin Interventions: Sensory Perception Interventions: Offload heels, Pad bony prominences, Reposition q 2hrs/turn Clock, Q2 hour skin assessment under devices if present  Moisture level Interventions: Moisture wicking products, Moisture barrier cream  Activity/Mobility Interventions: Pad bony prominences, TAP Seated positioning system when OOB, Promote PMP, Reposition q 2 hrs / turn clock, Offload heels  Friction and Shear Interventions: Pad bony prominences, Off load heels, HOB 30 degrees or less unless contraindicated, Consider: TAP seated positioning, Heel foams           Guidelines-Falls Prevention:  Interdisciplinary Interventions  Patient's current fall risk color? Yellow  Red/Yellow/Green/Blue Safety Interventions YELLOW LEVEL safety interventions  Safety interventions provided this shift (YELLOW)?: Staff always within arm's reach while patient in the bathroom, Bed alarm engaged, Chair alarm  engaged, Personal items within patient reach, Call bell accessible and within patient reach, Assistive devices and/or wheelchair NOT within patient reach  Shift Fall Risk Update Fall prevention level change this shift?: No    Education  Education Documentation  rosuvastatin  (CRESTOR ) tablet 10 mg, taught by Francella Patches, RN at 03/12/2024 10:32 PM.  Learner: Patient  Readiness: Acceptance  Method: Explanation  Response: Verbalizes Understanding    levETIRAcetam  (KEPPRA ) tablet 500 mg, taught by Francella Patches, RN at 03/12/2024 10:32 PM.  Learner: Patient  Readiness: Acceptance  Method: Explanation  Response: Verbalizes Understanding    gabapentin  (NEURONTIN ) capsule 300 mg, taught by Francella Patches, RN at 03/12/2024 10:32 PM.  Learner: Patient  Readiness: Acceptance  Method: Explanation  Response: Verbalizes Understanding    heparin (porcine) injection 5,000 units/mL, taught by Francella Patches, RN at 03/12/2024 10:32 PM.  Learner: Patient  Readiness: Acceptance  Method: Explanation  Response: Verbalizes Understanding    docusate sodium  (COLACE) capsule 100 mg, taught by Francella Patches, RN at 03/12/2024 10:32 PM.  Learner: Patient  Readiness: Acceptance  Method: Explanation  Response: Verbalizes Understanding    Education Comments  No comments found.        Interdisciplinary Care Plan Goals and Interventions  Encounter Problems       Encounter Problems (Active)       Pain Management       LTG: Patient will verbalize pain relief less than 2/10 with the use of pain medication and nonpharmacological pain measures such as deep breathing, relaxation techniques and repositioning 100% of the time       Start:  03/04/24    Expected End:  04/04/24  Risk of Infection       LTG: Patient incisions and abrasions will be free of any signs or symptoms of infection and will advance to healed by at least 50% by discharge date.       Start:  03/04/24    Expected End:  04/04/24               Safety Risk       LTG:  Patient will acknowledge limitations and call for assistance before transferring in and out of bed and with ambulation 100% of the tim       Start:  03/04/24    Expected End:  04/04/24               Skin Wound Management       LTG: Patient will show no evidence of skin breakdown during hospitalization.       Start:  03/04/24    Expected End:  04/04/24                  Encounter Problems (Resolved)       Mobility       LTG: Pt will transfer and ambulate x50' independently with LRAD to navigate home environment        Start:  03/04/24    Expected End:  03/13/24    Resolved:  03/12/24            Self Care Management       LTG: Pt will complete daily grooming routine with mod I       Start:  03/04/24    Expected End:  03/11/24    Resolved:  03/12/24              Problem: Pain Management  Goal: LTG: Patient will verbalize pain relief less than 2/10 with the use of pain medication and nonpharmacological pain measures such as deep breathing, relaxation techniques and repositioning 100% of the time  Outcome: Adequate for Discharge     Problem: Skin Wound Management  Goal: LTG: Patient will show no evidence of skin breakdown during hospitalization.  Outcome: Adequate for Discharge     Problem: Risk of Infection  Goal: LTG: Patient incisions and abrasions will be free of any signs or symptoms of infection and will advance to healed by at least 50% by discharge date.  Outcome: Adequate for Discharge     Problem: Safety Risk  Goal: LTG: Patient will acknowledge limitations and call for assistance before transferring in and out of bed and with ambulation 100% of the tim  Outcome: Adequate for Discharge    Lisabeth Sensor, RN

## 2024-03-13 NOTE — Discharge Summary (Signed)
 REHABILITATION MEDICINE  DISCHARGE SUMMARY AND DISCHARGE NOTE    Patient Identification  Jennifer [name] is a 68 y.o. female.  DOB:  03-11-1956  Admit Date:  03/03/2024  Discharge date: 03/13/2024   Attending Provider: Rea Ozell RAMAN., MD                                     Admission Diagnoses: Seizures (CMS/HCC) [R56.9]    Disposition:       Home with HH f/u.    Patient Instructions:       Dennison Sallyann COME, Speciality-General Internal Medicine  Call to schedule appointment to be seen in 7/10 days  [address]  [place], [place] [address]  Phone: [phone]     Emery Loach, MD-Neurology  Call to schedule appointment to be seen in 2 weeks  [facility] [address]  [place], [place] [address]     Ozell LULLA Foot, MD-Orthopedic Surgery   Call physician to schedule appointment to be seen in 2 weeks  [address]   [place], [place] [address]   Phone: [phone]        No driving until cleared by a physician or occupational therapist.       Discharge Medication List        Taking      acetaminophen  500 MG tablet  Dose: 1,000 mg  What changed:   how much to take  when to take this  Commonly known as: TYLENOL   Take 2 tablets (1,000 mg) by mouth every 8 (eight) hours     aspirin  EC 325 MG tablet  Dose: 325 mg  What changed: when to take this  Start taking on: March 14, 2024  Take 1 tablet (325 mg) by mouth once daily     cefuroxime 500 MG tablet  Dose: 500 mg  Commonly known as: CEFTIN  Take 1 tablet (500 mg) by mouth every 12 (twelve) hours for 5 days     clonazePAM  1 MG tablet  Dose: 1 mg  Commonly known as: KlonoPIN   Start taking on: March 14, 2024  Take 1 tablet (1 mg) by mouth once daily     docusate sodium  100 MG capsule  Dose: 100 mg  Commonly known as: COLACE  Start taking on: March 14, 2024  Take 1 capsule (100 mg) by mouth once daily     gabapentin  300 MG capsule  Dose: 300 mg  Commonly known as: NEURONTIN   Take 1 capsule (300 mg) by mouth every 8 (eight) hours     Lacosamide  200 MG Tabs  Dose:  200 mg  What changed: how much to take  Commonly known as: VIMPAT   Take 1 tablet (200 mg) by mouth 2 (two) times daily     levETIRAcetam  750 MG tablet  Dose: 1,500 mg  Commonly known as: KEPPRA   Take 2 tablets (1,500 mg) by mouth every 12 (twelve) hours  Replaces: levETIRAcetam  100 MG/ML oral solution     levothyroxine  100 MCG tablet  Dose: 100 mcg  What changed:   when to take this  Another medication with the same name was removed. Continue taking this medication, and follow the directions you see here.  Commonly known as: SYNTHROID   Start taking on: March 14, 2024  Take 1 tablet (100 mcg) by mouth Once a day at 6:00am     metoprolol  succinate XL 25 MG 24 hr tablet  Dose: 25  mg  Commonly known as: TOPROL -XL  Start taking on: March 14, 2024  Take 1 tablet (25 mg) by mouth once daily     NON FORMULARY  VITAMINS: MVI, calcium , b12, b50, vit D     oxyCODONE  5 MG immediate release tablet  Dose: 5 mg  Commonly known as: ROXICODONE   Take 1 tablet (5 mg) by mouth once daily as needed for Pain  Replaces: oxyCODONE  5 MG capsule     pantoprazole 40 MG tablet  Dose: 40 mg  Commonly known as: PROTONIX  Start taking on: March 14, 2024  Take 1 tablet (40 mg) by mouth every morning before breakfast     polyethylene glycol 17 g packet  Dose: 17 g  Commonly known as: MIRALAX   Start taking on: March 14, 2024  Take 17 g by mouth once daily     rosuvastatin  5 MG tablet  Dose: 5 mg  Commonly known as: CRESTOR   Take 1 tablet (5 mg) by mouth once nightly     triamterene -hydrochlorothiazide  37.5-25 MG per capsule  Dose: 1 capsule  Commonly known as: DYAZIDE   Take 1 capsule by mouth once every morning     vitamin D  (ergocalciferol ) 50000 UNIT Caps  Dose: 50,000 Units  Commonly known as: DRISDOL  Start taking on: March 15, 2024  Take 1 capsule (50,000 Units) by mouth once a week for 28 days            STOP taking these medications      lansoprazole  30 MG capsule  Commonly known as: PREVACID      levETIRAcetam  100 MG/ML oral  solution  Commonly known as: KEPPRA   Replaced by: levETIRAcetam  750 MG tablet     oxyCODONE  5 MG capsule  Commonly known as: OXY-IR  Replaced by: oxyCODONE  5 MG immediate release tablet              Medication Review    A complete drug regimen review was completed: Yes  Were any drug issues found during review?: No    Discharge Diagnoses:  And Hospital Course:       68 y/o female with PMH of epilepsy, HLD, hypothyroidism, DVT, s/p IVD filter, migraines, SVT, iron deficiency anemia, knee and hip replacements, colon resection, lumbar laminectomy, adverse anesthesia effects,  s/p fall after accidental OD on her meds, she had recent R shoulder replacement on 02/26/24.     #Rehab  - PT, OT with ADL, mobility, and ambulation with AD  - TR  - Fall, seizures, NWB RUE, and sling  -We had a team conference today  led by me and concur with team findings and recommendations. Please refer to the note for details.   10/10: IPOC completed today .  10/15: We had a team conference today  led by me and concur with team findings and recommendations. Please refer to the note for details.  Also had family meeting with patient, her husband, CM, therapist, nursing and updated on the medical and functional status.  10/17: D/c home with HH f/u.     #Ataxia, accidental OD with meds  #s/p fall  -Monitor, esp with use of pain meds  -Therapy with gait training, stability  -Per neurology, ataxia attributed to medication OD     #H/o seizure d/o  -Poss seizures  -CT, MRI of the brain with no acute abn  -Continue with seizure meds: Keppra , Lacosamide ,   -On low dose of clonazepam   -Seizure precaution  -Neurology f/u  10/10: Per medicine, decrease gabapentin , after discussing with  patient's husband who is a Teacher, early years/pre, request reducing dosage, neurology consulted per medicine.  10/14: Patient, her husband, agree with continue with lower dose of gabapentin , eventually taper further, follow-up with neurology.  10/15: After discussing with family meeting  will continue with current medication and to follow-up with neurologist to adjust meds.  10/16: After discussing with patient, Dr. Meliton, possibly increased gabapentin  due to neuropathic symptoms of both lower leg.  Dr. Silvis to discuss with patient's husband regarding change in meds.     #S/p R shoulder replacement  -NWB RUE, on sling  -f/u with Dr. Barnabas  -shoulder x ray with postop changes, no displaced or fx  -Pt has f/u with orthopedic NP this Friday for postop f/u.  10/9: Per patient's husband, rescheduled for surgery follow-up with Dr. Brendon on 03/10/24 for postop follow-up.  10/13: LOA tomorrow for f/u with surgeon for postop f/u.  10/14: LOA today  and f/u with surgeon.  10/15: Started on protocol for shoulder surgery, and Voltaren  gel, lidocaine  patch and Tylenol  1000 mg every 8 hours for shoulder pain.  Patient and her husband agree with the plan.  Consider taking less narcotics for pain.  10/16: Patient refuses Voltaren  gel, lidocaine  patch, Tylenol  is helping with the pain.  10/17: Pt to continue with Tylenol , prn oxycodone , f/u with orthopedist.     #HLD  - On Crestor      #Hypothyroidism  -On synthroid      #H/o iron def anemia  -CBC in am  -Est baseline Hgb 12.0 g/dl  -H/H 89.4/67.6 today , interm f/u. Postop anemia.  10/9: F/u CBC in am.  10/10: H/H 10.2/31.3 today , f/u CBC Monday.  10/13: H/H 9.6/29.6 today , f/u in 2 days to monitor anemia.  10/15: H/H 9.6/29.1 today , stable, interm f/u.  10/17: F/u with PCP to monitor CBC.     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 No leukocytosis.  10/14: UA with mod pyuria, rec medicine f/u for poss abx tx. F/u c/s.  10/15: Urine growing E. coli, started on Ceftin per medicine. F/u C/S.  10/17: E. Coli sensitive to cephalosporin, continue for total of 7 days, and f/u with PCP.     #Dispo  -Plan for d/c  on 03/13/24 with HH f/u.       Discharge Functional Status:        Self Care Functional Status:    Current Status Current Status Discharge Goal   Functional Area: Care Score: Comments:     Eating 6 able to manage all items with increased time and using R hand as a stablizer Independent   Oral Hygiene 6 able to brush teeth with L hand, able to use R hand to manipulate toothpaste Independent   Toileting Hygiene 3 assist to wash pericare d/t decreased ROM in RUE Independent   Shower/Bathe Self 3 assist to wash buttocks in standing with steadying assist Independent   Upper Body Dressing 3 assist to thread R arm able to assist c L arm to don up arm and over head, extra time needed to don over head and decend over torso Independent   Lower Body Dressing 3 assist to thread and manage pants up legs, able to assist c L arm Independent   Putting On/Taking  Off Footwear 3 assist to thread on socks Independent      Mobility Functional Status:    Current   Status Current Status Discharge Goal   Functional Area: Care Score: Comments:     Toilet Transfer 6 able to complete short ambulation c hemiwalker to [facility] Independent   Picking Up Object 6 able to reach c L arm Independent     Mobility Functional Status:    Current   Status Current Status Discharge Goal   Functional Area: Care Score:  Comments:     Roll Left and Right 6 To L, did not roll to LR due to sling Independent (To L, will not roll to R due ot precautions)   Sit to Lying 6 flat bed no rails Independent   Lying to Sitting on Side of Bed 6 flat bed no rails Independent   Sit to Stand 6 to hemiwalker Independent   Chair/Bed-to-Chair Transfer 6 stand pivot no AD Water quality scientist 6 ambulaotry transfer with HW Independent   Walk 10 Feet 6 HW Independent   Walk 50 Feet with Two Turns 6 HW Independent   Walk 10 Feet on Uneven Surface 6 HW Independent   Walk 150 Feet 4 HW, VC for increasing B step length and speed Independent   1 Step (Curb) 6 HW Independent   4 Steps 6  LHR per home set up side stepping when descending Independent   12 Steps 4 LHR per home set up side stepping when descending Supervision or touching assistance       History:        This 68 y.o. year old right hand-dominant female with PMH of epilepsy, HLD, hypothyroidism, DVT, s/p IVD filter, migraines, SVT, iron deficiency anemia, knee and hip replacements, colon resection, lumbar laminectomy, adverse anesthesia effects, who presented to [facility] on 02/28/24 after mechanical fall in shower.  Patient had recent right shoulder replacement on 02/26/24 (NWB to RUE with sling) and accidentally overdosed on her medications. Patient stated she felt like she might have been having aura that she sees prior to her having a seizure.     CT head showed no acute abnormalities. 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 Soft tissue gas along the right shoulder joint is favored to be postsurgical related. No acute displaced fracture or malalignment. Xray of right arm showed no acute findings.     Neurology was consulted. Ataxia attributed to due to medication overdosage versus other causes. Patient also is on clonazepam  1 mg 3 times daily and gabapentin  300 mg 4 times daily  MRI of the brain negative for acute abnormality.   Continue Keppra  1500 mg twice daily per home dosage  Continue lacosamide  200 mg 2 times daily per home dosage     Patient presents with unsteady gait, impaired balance, decreased strength, impaired functional mobility, decreased activity tolerance, orthopedic restrictions, and decreased ROM s/p R reverse TSA with 3 subsequent falls at home since 10/1.  The patient has been working well with therapy services to include PT and OT to address functional mobility, self care deficit, and pain.  The patient would benefit from an intensive level of rehab along with close medical management of multiple medical conditions and rehab MD to drive the rehab process.  The  patient is medically stable for IRF level of care.     PRECAUTIONS:     Fall  Seizures  NWB RUE, sling  Past Medical History: [Medical History]    [Medical History]  Past Medical History       Diagnosis Date    Abnormal vision       glasses    Arrhythmia 2018     SVT (had ablation)    Arthritis      Complication of anesthesia       urinary retention with every joint replacement, BP drop with spinal X1, another time spinal didn't work    Convulsions (CMS/HCC)       epilepsy, last seizure 2010    Deep venous thrombosis of distal lower extremity (CMS/HCC) 2018     after cholecystectomy; left leg    Diverticulitis       takes miralax     Encounter for blood transfusion 2018    Gastric ulceration 2018     GI bleed with requiring 4 units PRBCs per pt    Hyperlipidemia      Hypothyroidism      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  No new issues.  Alert and NAD  Cardiac:  Regular rate and rhythm  Lungs:  Clear to auscultation  Abdomen: soft, with bowel sounds, nontender, non-distended  Extremities:  No calf tenderness. No pitting edema of BLE.  R ant shoulder surg site intact, no open wound. Limited mobility 2/2 to pain.    INPATIENT REHABILITATION FACILITY - PATIENT ASSESSMENT INSTRUMENT QUALITY INDICATORS: COGNITIVE PATTERNS    Cognitive Pattern Assessment Used: BIMS   Repetition of Three Words (First Attempt): 3   Temporal Orientation: Year: Correct   Temporal Orientation: Month: Accurate within 5 days  Temporal Orientation: Day: Correct  Recall: Sock: Yes, no cue required  Recall: Blue: Yes, no cue required  Recall: Bed:  Yes, no cue required  BIMS Summary Score: 15      INPATIENT REHABILITATION FACILITY - PATIENT ASSESSMENT INSTRUMENT QUALITY INDICATORS: CONFUSION ASSESSMENT METHOD (CAM)    Is there evidence of an acute change in mental status from the patient's baseline?: No   Inattention: Behavior not present   Disorganized thinking: Behavior not present  Altered level of consciousness: Behavior not present      INPATIENT REHABILITATION FACILITY - PATIENT ASSESSMENT INSTRUMENT QUALITY INDICATORS: PHQ-2 TO 9    Is the patient able to answer the depression risk questions?: Yes   Little interest or pleasure in doing things: Not at all   Feeling down, depressed, or hopeless: Not at all   PHQ2 Score: 0       No results for input(s): GLUCOSEWHOLE in the last 24 hours.    Recent Labs   Lab 03/11/24  0524 03/09/24  0648   WBC 6.18 7.30   Hemoglobin 9.6* 9.6*   Hematocrit 29.1* 29.6*   Platelet Count 333 336  Radiology: all results from this admission  MRI Head without Contrast  Result Date: 03/02/2024  IMPRESSION: 1. No acute intracranial abnormality, specifically no evidence of acute ischemia. 2. Mild white matter T2/FLAIR hyperintensity(s) are present. These can be seen in symptomatic and asymptomatic patients, but most commonly related to chronic microangiopathy. Less common etiologies include migraines, demyelinating disease, and vasculitis. Electronically signed by: Selinda Moats  Board Certified Radiologist 03/02/2024 12:14 PM    CT Head WO Contrast  Result Date: 02/28/2024  IMPRESSION: No definite acute intracranial hemorrhage or mass effect identified. Electronically signed by: Ami Breeding, MD  Board Certified Radiologist 02/28/2024 6:13 PM    XR Humerus Right AP Lateral  Result Date: 02/28/2024  IMPRESSION: No acute findings. Repeat humerus radiographs are recommended given the distal humerus is not visualized on the current exam. Electronically signed by: Selinda Moats  Board Certified Radiologist 02/28/2024  5:36 PM    XR Shoulder Right 2+ Views  Result Date: 02/28/2024  IMPRESSION: Postsurgical changes of right total shoulder arthroplasty. Soft tissue gas along the right shoulder joint is favored to be postsurgical related. No acute displaced fracture or malalignment. 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 Electronically signed by: Lorrene DELENA General, MD  Board Certified Radiologist 02/16/2024 7:30 PM EDT     Discharge planning, preparation, and coordination took more than 35 minutes.    Ozell GORMAN Lawyer, MD  [facility]      If there are questions or concerns about the content of this note or information contained within the body of this dictation they should be addressed directly with the author for clarification.

## 2024-03-16 ENCOUNTER — Ambulatory Visit (INDEPENDENT_AMBULATORY_CARE_PROVIDER_SITE_OTHER): Admitting: Internal Medicine

## 2024-03-17 NOTE — Progress Notes (Signed)
 Psychology Services  Inpatient Rehabilitation Progress Note    Patient Name:  Jennifer [name]       Medical Record Number: [id number]   Date of Birth: Sep 22, 1955  Sex: Female          Room/Bed:  M555/M555.01         Rehab Diagnosis: Seizures (CMS/HCC) [R56.9]      Past Medical History:   Medical History[1]      Behavioral Observations and Mental Status:  The patient was seen in the patient's room for adjustment to medical situation and rehabilitation hospitalization in accordance with the attending physician's initial plan of care. The patient has consented to psychology/neuropsychology services. The patient was found in her room. She was alert and cooperative. Jennifer [name] was oriented to person, place, time, date and situation. Affect was WNL, and she made appropriate eye contact. She was engaging throughout the session and demonstrated spontaneous speech. Personal grooming and hygiene were good. Speech was of normal rhythm, volume, rate, and prosody.  Processing speed was within normal limits. Behavioral signs of pain were not observed. There was no evidence of her responding to internal stimuli, and there was no delusional content in her speech. At the time of the session, the patient did not appear to be in or report any acute distress. Thought process was linear and goal oriented; content was appropriate. Patient did not endorse any suicidal ideation, intent, and/or plan.    Assessment  Impressions: Met with the patient regarding upcoming discharge. She was given space to process frustrations and concerns, which included team transparency and communication. Readiness for upcoming discharge was discussed in depth, such as problem solving challenges and advocating for her needs. The patient reported mental and physical readiness to transition out of acute rehabilitation. Continued adaptive coping and adjustment was highlighted. A person centered approach was utilized.    Plan  Facilitate adaptive coping and  adjustment to medical condition and rehabilitation process.           [1]   Past Medical History:  Diagnosis Date    Abnormal vision     glasses    Arrhythmia 2018    SVT (had ablation)    Arthritis     Complication of anesthesia     urinary retention with every joint replacement, BP drop with spinal X1, another time spinal didn't work    Convulsions (CMS/HCC)     epilepsy, last seizure 2010    Deep venous thrombosis of distal lower extremity (CMS/HCC) 2018    after cholecystectomy; left leg    Diverticulitis     takes miralax     Encounter for blood transfusion 2018    Gastric ulceration 2018    GI bleed with requiring 4 units PRBCs per pt    Hyperlipidemia     Hypothyroidism     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

## 2024-03-24 NOTE — H&P (Signed)
 [facility] HEALTH INPATIENT MEDICINE  HOSPITAL MEDICINE HISTORY AND PHYSICAL      Primary care physician: Lemond Henry  Date of Admission: 03/23/2024    ASSESSMENT:  Jennifer [name] is a 68 y.o. female who will be admitted for Acute deep vein thrombosis (DVT) of right lower extremity (CMS/HCC) .    Principal Problem:    Acute deep vein thrombosis (DVT) of right lower extremity (CMS/HCC)  Active Problems:    Generalized weakness    Frequent falls    Incidental pulmonary nodule      PLAN:    Acute RLE DVT    h/o LLE DVT previously tx w/ apixaban  c/b GI bleed sp IVC Filter (2019)  Doppler US  Right: acute nearly occlusive RIGHT common femoral, profunda femoral, femoral, popliteal, peroneal and posterior tibial veins.   CTA Chest: No e/o acute pulmonary embolism.   Per IR, no urgent need for thrombectomy overnight.   - c/w heparin  gtt  - Bedrest overnight, Neurovasc checks  - Reconsult IR CS given severe pain.  - Consider Heme CS re danticoagulation.   - Recommend follow up with PCP re age-appropriate cancer screening  - No role for thrombophilia/ APLAS testing        Generalized Weakness    Frequent Falls  Recently admitted to [facility] and then sent to a rehab placed.  Fatigue & generalized weakness w/ multiple falls since the begining of month.   CTH: No acute intracranial changes  - PT/OT/ Nutrition CS       Seizure Disorder- c/h keppra , lacosamide     CHECKLIST:   Nutrition Diet Adult; Clear Liquid (IDDSI 0-1); Clear Liquid Standard; None; None  Diet Adult; Nothing by Mouth EXCEPT; Surgery/ Procedure; Sips of water  with medications, Ice Chips; None    Lines/ Drains/ Tubes/Wounds       Patient Lines/Drains/Airways Status       Active LDA       Name Placement date Placement time Site Days    Peripheral IV 03/23/24 20 Anterior;Left Antecubital No guidance used 03/23/24  1903  Antecubital  less than 1    Peripheral IV 03/23/24 20 Anterior;Right Antecubital 03/23/24  2109  Antecubital  less than 1                   VTE  Prophylaxis Anticoagulation Medications   Medication Sig   None      Last Anticoag Admin            heparin  25,000 units in 250 mL infusion (final conc 100 units/mL)    New Bag 1,900 Units/hr at 03/23/24 2116    Frequency: CONTINUOUS         There are additional administrations since 03/21/24 0011 that are not shown.    No unadministered anticoagulant orders found.            Advance Care Planning  Discussed advanced care planning with patient.   If needed, Surrogate decisionmaker/  Primary Emergency Contact: Tullos,DALE   Code Status Full Code           ----------------------------------------------------------------------------------------------------------------------  CHIEF COMPLAINT: had concerns including Leg Swelling (Brought in by EMS 518 Patient with right leg swelling since thursday, has IVC filter. Patient reports also feeling fatigue and generalized weakness. Patient had multiple falls since the begining of month, admitted to [facility] and then sent to a rehab placed. ).    HISTORY OF PRESENT ILLNESS:  Jennifer [name] is a 68 y.o. female who presented with the chief  complaint listed above.      Patient was recently admitted to [facility] b/w 10/3-10/7 after falling and injuring her right shoulder sp replacement. Was discharged to IPR and notes progressing well and being able to walk 100 feet using a cane. Was discharged from rehab to home on aspirin  for DVT prophylaxis and HH PT/OT approx 2 weeks ago. Since then has had a significant decline in mobility and notes one day noticing she had significant difficulty and dyspnea on exertion by just walking to the bathroom. This worsened and to the point that she felt too weak to lift her legs from the floor to the bed. This was associated with pain in her right lower extremity (primarily in the groin). Additionally her home health nurse had noted that her RLE appeared more swollen than her LLE.     At the bedside: Notes ongoing pain in her RLE.  primarily in the groin region.     Re history of DVTs/ IVC filter, reports that she had a history of a proximal provoked DVT sp a surgical procedure. Was placed on apixaban  but some months later developed a GI bleed, and it was discontinued and instead had an IVC filter placed (2019).     REVIEW OF SYSTEMS:   All other systems in the 14-point ROS were reviewed, pertinent ROS noted in HPI and all other systems negative.       Past Medical History[1]    Past Surgical History[2]     No family history on file.    Tobacco Use History[3]    Social History     Substance and Sexual Activity   Alcohol Use None       Social History     Substance and Sexual Activity   Drug Use Not on file       Social History     Social History Narrative    Not on file       Prior to Admission medications   Medication Sig Start Date End Date Taking? Authorizing Provider   acetaminophen  (TYLENOL ) 500 MG tablet Take 1 tablet by mouth every 6 hours as needed.   Yes Nurse Emergency, RN   aspirin  325 MG tablet Take 81 mg by mouth daily.   Yes Nurse Emergency, RN   clonazePAM  (KLONOPIN ) 1 MG tablet Take 1 tablet by mouth 2 times daily as needed for anxiety.   Yes Nurse Emergency, RN   Ergocalciferol  (ERGOCALCIFEROL ) POWD Take by mouth.   Yes Nurse Emergency, RN   gabapentin  (NEURONTIN ) 300 MG capsule Take 1 capsule by mouth 4 times daily.   Yes Nurse Emergency, RN   lacosamide  (VIMPAT ) 100 MG TABS Take 2 tablets by mouth 2 times daily.   Yes Nurse Emergency, RN   lansoprazole  (PREVACID ) 30 MG capsule Take 1 capsule by mouth daily.   Yes Nurse Emergency, RN   levETIRAcetam  (KEPPRA ) 100 MG/ML solution Take 15 mL by mouth 2 times daily. 02/13/24  Yes Historical Provider, MD   levETIRAcetam  (KEPPRA ) 500 MG tablet Take 3 tablets by mouth 2 times daily.   Yes Nurse Emergency, RN   levothyroxine  (SYNTHROID ) 88 MCG tablet Take 1 tablet by mouth daily.  Take 30 minutes to 1 hour before breakfast   Yes Nurse Emergency, RN   metoprolol  succinate ER (TOPROL -XL)  25 MG 24 hr tablet Take 1 tablet by mouth daily.   Yes Nurse Emergency, RN   rosuvastatin  (CRESTOR ) 5 MG tablet Take 1 tablet by mouth daily.  Yes Nurse Emergency, RN   triamterene -hydrochlorothiazide  (DYAZIDE ) 37.5-25 MG per capsule Take 1 capsule by mouth daily.   Yes Nurse Emergency, RN          No Known Allergies         ----------------------------------------------------------------------------------------------------------------------  Objective  Vital Signs  Most recent vitals: 24-hour range:   Vitals:    03/23/24 2246   BP: (!) 108/57   Pulse: 60   Resp: 12   Temp:     Temp:  [36.9 C (98.4 F)]   Heart Rate:  [60-70]   Resp:  [12-26]   BP: (103-115)/(42-59)   SpO2:  [93 %-100 %]      No intake or output data in the 24 hours ending 03/24/24 0011    Nursing notes and vital signs reviewed.    Constitutional: NAD, well-appearing  HEENT: NC/AT. PERRLa, EOMi.  MMM  Cardiovascular: Normal rate, Regular rhythm.   Respiratory: CTAB, no increased work of breathing  Gastrointestinal: S/ND/NTTP, no guarding or rebound. (+) bowel sounds.  Musculoskeletal: RLE edema w/ TTP at proximal inner thigh to groin area.   Neurological: Alert & Oriented x 3, No focal deficits.  Psychological: pleasant and cooperative  Skin: Warm and dry, no visible rashes or lesions    ----------------------------------------------------------------------------------------------------------------------  DIAGNOSTIC DATA:    Recent Results (from the past 24 hours)   CBC with Differential    Collection Time: 03/23/24  7:01 PM   Result Value Ref Range    WBC 8.6 3.7 - 11.0 thou/mcL    RBC 2.78 (L) 4.01 - 4.90 million/mcL    Hemoglobin 8.5 (L) 12.4 - 14.9 G/DL    Hematocrit 72.9 (L) 35.8 - 47.9 %    MCV 97.1 87.0 - 98.0 FL    MCH 30.6 27.0 - 33.0 PG    MCHC 31.5 31.0 - 37.0 gm/dL    RDW-SD 53.8 36 - 47 fL    Platelets 306 150 - 400 K/UL    MPV 10.9 8.9 - 11.0 FL    Neutrophils Percent 67.2 50.0 - 70.0 %    Absolute Neutrophil Count 5.80 1.50 - 7.50  thou/mcL    Nucleated RBC Percent 0 0 %    Nucleated RBC Abs 0.000 0 K/UL    Lymphocytes Percent 20.3 (L) 25.0 - 40.0 %    Lymphocytes Abs 1.8 thou/mcL    Monocytes Percent 8.9 4.0 - 12.0 %    Monos Abs 0.8 0.1 - 0.8 thou/mcL    Eosinophils Percent 3.2 1.0 - 6.0 %    Eosinophils Absolute 0.28 0.00 - 0.50 K/UL    Basophils Percent 0.2 0.0 - 2.0 %    Basophils Absolute 0.0 0.0 - 0.2 thou/mcL    IG, Percent 0.20 <=0.42 %    IG, Absolute 0.020 <=0.031 thou/mcL   Comprehensive metabolic panel    Collection Time: 03/23/24  7:01 PM   Result Value Ref Range    Sodium 141 136 - 145 mmol/L    Potassium 3.5 3.4 - 4.8 mmol/L    Chloride 105 98 - 107 MMOL/L    CO2 23 23 - 31 MMOL/L    Bun 20 10 - 20 mg/dL    Creatinine 0.9 0.6 - 1.1 mg/dL    Glucose 93 74 - 99 mg/dL    Calcium  9.3 8.5 - 10.5 mg/dL    Total Protein 7.0 5.8 - 8.1 g/dL    Albumin  3.9 3.2 - 4.6 g/dL    Total Bilirubin 0.4 0.3 - 1.2 mg/dL  Alk Phos 62 40 - 150 U/L    Ast 21 <35 U/L    Alt 12 <55 u/L    Anion Gap 13 5 - 15 mmol/L    est GFRcr 69 >=60 mL/min/1.81m2   Troponin I, High Sensitivity    Collection Time: 03/23/24  7:01 PM   Result Value Ref Range    High Sensitivity Troponin I <2.7 <5.0 ng/L   NT Pro BNP    Collection Time: 03/23/24  7:01 PM   Result Value Ref Range    NTBNP 179.3 <=334.1 pg/mL   Magnesium     Collection Time: 03/23/24  7:01 PM   Result Value Ref Range    Magnesium  2.3 1.6 - 2.6 mg/dL   RSV, FLU A&B, RNCPI-80 PCR    Collection Time: 03/23/24  7:01 PM    Specimen: Nasopharyngeal; Swab   Result Value Ref Range    Influenza A Negative Negative, Invalid    Influenza B Negative Negative, Invalid    RSV PCR Negative Negative, Invalid    COVID-19, PCR Not Detected Not Detected   ANTI-XA (UFH) - HEPARIN  INFUSION    Collection Time: 03/23/24  9:10 PM   Result Value Ref Range    Anti-Xa UFH <0.10 See Therapeutic range IU/mL        Significant Imaging:    CT HEAD WO CONTRAST   Final Result   IMPRESSION:      Periventricular white matter lucency  consistent with small vessel ischemic   changes.     Mild cerebral atrophy.     No acute intracranial pathology identified..         Thank you for this referral. For further consultation, please call   [phone] or [phone] (phones answered 24/7/365).       Electronically Signed by Taft  , MD   03/23/2024 8:57 PM EDT   [facility]      CTA CHEST   Final Result   IMPRESSION:   No CT evidence of acute pulmonary embolism.      Nonspecific pulmonary nodule noted in the right upper lobe measuring 2.7 mm.         Thank you for this referral. For further consultation, please call   [phone] or [phone] (phones answered 24/7/365).       Electronically Signed by Taft Rebel , MD   03/23/2024 9:01 PM EDT   [facility]      US  LOWER EXTREMITY - DOPPLER DVT - RT   Final Result   IMPRESSION:     Evidence for acute nearly occlusive DVT within the right common femoral,   profunda femoral, femoral, popliteal, peroneal and posterior tibial veins.        Superficial thrombosis within the right greater saphenous vein.      Dr. Cecile discussed the critical findings which were acknowledged by   phone with Dr. Juliene Aguas at 2020 hours on 03/23/2024.         Thank you for this referral. For further consultation, please call   [phone] or [phone] (phones answered 24/7/365).       Electronically Signed by Selinda Cecile, DO   03/23/2024 8:43 PM EDT   [facility]      XR CHEST SINGLE VW   Final Result   IMPRESSION:   No acute pulmonary abnormality.         Thank you for this referral. For further consultation, please call   [phone] or [phone] (  phones answered 24/7/365).       Electronically Signed by Glena Drone, MD   03/23/2024 7:56 PM EDT   [facility]           I have personally visualized the:  CXR, and agree that it shows no acute process as noted in the full report.    Cardiac Tracings:   I have  personally visualized the:   Telemetry and it shows SR.         Electronically signed by:  Ozie Heater, MD  [facility]  03/24/2024 / 00:11               [1]  Past Medical History:   DVT (deep venous thrombosis) (CMS/HCC)    SVT (supraventricular tachycardia)   [2]  Past Surgical History:  Procedure Laterality Date    COLON SURGERY      LAMINECTOMY      LAPAROSCOPIC CHOLECYSTECTOMY W/ IOC      TOTAL HIP ARTHROPLASTY Left     TOTAL HIP ARTHROPLASTY Right     TOTAL KNEE ARTHROPLASTY Left    [3]  Social History  Tobacco Use   Smoking Status Not on file   Smokeless Tobacco Not on file

## 2024-03-25 ENCOUNTER — Inpatient Hospital Stay (INDEPENDENT_AMBULATORY_CARE_PROVIDER_SITE_OTHER): Admitting: Internal Medicine

## 2024-03-30 ENCOUNTER — Encounter (INDEPENDENT_AMBULATORY_CARE_PROVIDER_SITE_OTHER): Payer: Self-pay | Admitting: Internal Medicine

## 2024-03-31 ENCOUNTER — Encounter (INDEPENDENT_AMBULATORY_CARE_PROVIDER_SITE_OTHER): Payer: Self-pay | Admitting: Internal Medicine

## 2024-04-01 ENCOUNTER — Ambulatory Visit (INDEPENDENT_AMBULATORY_CARE_PROVIDER_SITE_OTHER): Admitting: Internal Medicine

## 2024-04-02 ENCOUNTER — Encounter (INDEPENDENT_AMBULATORY_CARE_PROVIDER_SITE_OTHER): Payer: Self-pay

## 2024-04-02 ENCOUNTER — Telehealth (INDEPENDENT_AMBULATORY_CARE_PROVIDER_SITE_OTHER): Payer: Self-pay | Admitting: Internal Medicine

## 2024-04-02 ENCOUNTER — Encounter (INDEPENDENT_AMBULATORY_CARE_PROVIDER_SITE_OTHER): Payer: Self-pay | Admitting: Internal Medicine

## 2024-04-02 NOTE — Telephone Encounter (Signed)
 Copied from CRM #[id number]. Topic: Appointment Scheduling - Schedule Appointment  >> Apr 02, 2024 11:11 AM Burnadette RODES wrote:  LAURIAN MACINTOSH A called about Appointment Scheduling for post hospital.  hospital released on  03/25/2024. Please advise Thanks.       Additional details: patient preferred call back # [phone]

## 2024-04-02 NOTE — Telephone Encounter (Signed)
 Patient scheduled.

## 2024-04-03 ENCOUNTER — Encounter (INDEPENDENT_AMBULATORY_CARE_PROVIDER_SITE_OTHER): Payer: Self-pay | Admitting: Internal Medicine

## 2024-04-04 ENCOUNTER — Encounter (INDEPENDENT_AMBULATORY_CARE_PROVIDER_SITE_OTHER): Payer: Self-pay

## 2024-04-06 ENCOUNTER — Encounter (INDEPENDENT_AMBULATORY_CARE_PROVIDER_SITE_OTHER): Payer: Self-pay

## 2024-04-06 ENCOUNTER — Ambulatory Visit (INDEPENDENT_AMBULATORY_CARE_PROVIDER_SITE_OTHER)

## 2024-04-06 VITALS — BP 99/67 | HR 57 | Temp 96.9°F | Ht 63.0 in | Wt 230.0 lb

## 2024-04-06 DIAGNOSIS — I82491 Acute embolism and thrombosis of other specified deep vein of right lower extremity: Secondary | ICD-10-CM

## 2024-04-06 NOTE — Progress Notes (Signed)
 Have you seen any specialists/other providers since your last visit with [place]?    No     The patient is due for:   Health Maintenance Due   Topic Date Due    Advance Directive on File  Never done

## 2024-04-06 NOTE — Progress Notes (Signed)
 Chief Complaint:  Hospital follow up RLE deep venous thrombosis.    HPI:    68 yo female, a retired [employer], presents with her husband for hospital follow up.  Per pt, she had the right shoulder surgery (reversal) by [facility] on 02/26/2024. She did  postop  rehab from 10/7 to 10/17.   Started having right sided groin pain with swelling in her RLE after she  had finished the  rehab. No CP or SOB.   (She has a h/o LLE DVT in 2019 and has the IVF filter. Denied any personal  or any fhx of  blood disorder. She was on Eliquis  for 3 months after the  LLE DVT in 2019.).    Pain in her right side of the  groin got worse 2 days before she  went to the  hospital at [facility] health on 03/23/2024. After the ER work up  they had diagnosed her with RLE DVT and admitted her for 3 days. No PE - chest CT was negative. After  stabilization, she was discharged to home on 10/29 on Eliquis . Hospital advised her to set up a f/u with heme/onc, she will do it today .  Denies any headache, spontaneous bleeding or any bleeding from the Eliquis .     Has h/o stomach ulcer many  years ago, had EGD last year which was normal and was advised to continue her Protonix once a day.   Denied any epigastric  pain or abdominal pain or any black tarry stool.    Hospital discontinued her Aspirin  and put her on Eliquis .  Right leg pain is better and the swelling is also  slowly getting better.    Has asymptomatic murmur and has been followed by cardiologist Dr. Read at [facility].   Her BP and HR looked little low today . Per pt, this is her normal BP and HR. She denied any lightheadedness or dizziness, syncope or near-syncope.       The following portions of the patient's history were reviewed and updated as appropriate: allergies, current medications, past family history, past medical history, past social history, past surgical history and problem list.      ROS:    Constitutional:  Denies fever or chills.   Skin: Denies bruising.   HENT: Denies  headaches  Cardiovascular:  Denies any chest pain or palpitation.   Respiratory: Denies any cough, SOB, wheezing or dyspnea on exertion  Gastrointestinal:  No abdominal  or epigastric pain. No N/V/D/C. No heartburn. No blood in stool.   No black tarry stool.      Vital Signs:    BP 99/67 (BP Site: Left arm, Patient Position: Sitting, Cuff Size: Large)   Pulse (!) 57   Temp (!) 96.9 F (36.1 C)   Ht 1.6 m (5' 3)   Wt 104.3 kg (230 lb)   BMI 40.74 kg/m       Physical Examination:    APPEARANCE: Healthy appearing, no acute distress, cooperative.  HEART: RRR. Positive murmur. No gallops.  LUNG: CTA bilaterally. No W/R/R.   No supraclavicular, intercostal or subcostal retraction. No stridor, good air exchange.  ABDOMEN: Soft. NT/ND. No  G/R/R. BS normal. No masses. No CVAT bilaterally.  EXTREMITIES: Noted a mild edema in her RLE below the  knee when compared to her LLE.   Warm and well perfused. No skin discoloration. Normal pedal pulses bilaterally. No calf tenderness bilaterally.  Negative Homan's sign bilaterally.  NEURO: Awake, alert and oriented x 3. Cranial nerves  II-XII grossly intact bilaterally. Muscle  strength 5/5 bilaterally. Normal gait  SKIN: No rashes. No lesions         Assessment:  No diagnosis found.      Plan:  Recommended walking/activities and elevation of lower extremities on 2 pillows above the heart level and pedal pump exercises to get rid of the swelling.   Continue Eliquis .  Set up apt with heme/onc as recommended by the  hospital.  Continue to follow cardiologist.  Continue Protonix as advised by GI.  Advised to go back to ER if any worsening of her symptoms.  RTC as needed.    Per pt, she already has a f/u apt  with her pcp in Feb.

## 2024-04-07 ENCOUNTER — Telehealth (INDEPENDENT_AMBULATORY_CARE_PROVIDER_SITE_OTHER): Payer: Self-pay | Admitting: Internal Medicine

## 2024-04-07 NOTE — Telephone Encounter (Signed)
 Copied from CRM [id number]. Topic: Clinical Support - Medical Question  >> Apr 07, 2024  8:26 AM Zelda BRAVO wrote:  Jennifer [name] called about Clinical Support - Medical Question.  Additional details: Loran from [facility] office [phone]  Requesting latest Lab and latest office visits notes to be Faxed to # [phone] ASAP.

## 2024-04-10 ENCOUNTER — Other Ambulatory Visit (INDEPENDENT_AMBULATORY_CARE_PROVIDER_SITE_OTHER): Payer: Self-pay | Admitting: Internal Medicine

## 2024-04-10 DIAGNOSIS — I10 Essential (primary) hypertension: Secondary | ICD-10-CM

## 2024-04-14 ENCOUNTER — Encounter (INDEPENDENT_AMBULATORY_CARE_PROVIDER_SITE_OTHER): Payer: Self-pay | Admitting: Internal Medicine

## 2024-04-15 ENCOUNTER — Inpatient Hospital Stay (INDEPENDENT_AMBULATORY_CARE_PROVIDER_SITE_OTHER): Admitting: Internal Medicine

## 2024-04-16 NOTE — Telephone Encounter (Signed)
 Records were faxed to [facility].

## 2024-04-26 ENCOUNTER — Encounter (INDEPENDENT_AMBULATORY_CARE_PROVIDER_SITE_OTHER): Payer: Self-pay

## 2024-04-26 ENCOUNTER — Ambulatory Visit (INDEPENDENT_AMBULATORY_CARE_PROVIDER_SITE_OTHER)

## 2024-04-26 ENCOUNTER — Ambulatory Visit (INDEPENDENT_AMBULATORY_CARE_PROVIDER_SITE_OTHER): Payer: Self-pay | Admitting: Student in an Organized Health Care Education/Training Program

## 2024-04-26 ENCOUNTER — Ambulatory Visit (INDEPENDENT_AMBULATORY_CARE_PROVIDER_SITE_OTHER): Admitting: Family

## 2024-04-26 VITALS — BP 100/64 | HR 64 | Temp 98.5°F | Resp 19 | Ht 63.0 in | Wt 232.0 lb

## 2024-04-26 DIAGNOSIS — J069 Acute upper respiratory infection, unspecified: Secondary | ICD-10-CM

## 2024-04-26 DIAGNOSIS — R051 Acute cough: Secondary | ICD-10-CM

## 2024-04-26 DIAGNOSIS — B9689 Other specified bacterial agents as the cause of diseases classified elsewhere: Secondary | ICD-10-CM

## 2024-04-26 MED ORDER — AMOXICILLIN 875 MG PO TABS: 875.0000 mg | ORAL_TABLET | Freq: Two times a day (BID) | ORAL | 0 refills | Status: AC

## 2024-04-26 NOTE — Progress Notes (Signed)
Chest x-ray negative, no evidence of bacterial process requiring antibiotic treatment. Please follow up with your primary care provider.

## 2024-04-26 NOTE — Progress Notes (Signed)
 [facility]  OFFICE NOTE         Subjective   Historian: Patient      Chief Complaint   Patient presents with    Cough     Pt c/o of cough and post nasal drip and phlem and cough is consistent x10 days. Pt took otc nightime  mucinex  and alternating with robutussin dm and benadryl  and tylenol  okay. Pt did at home covid / flu test yesterday negative         History of Present Illness  Jennifer [name] is a 68 year old female with a recent DVT who presents with a 10-day history of cough, runny nose, sore throat, and earaches.    She has had a productive cough with phlegm that tastes unusual, runny nose, sore throat, and earaches for 10 days. She feels very tired and is sleeping more. She denies chest pain, shortness of breath, fever, or hemoptysis.    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    She had wheezing yesterday and used albuterol  at home.    History:  Medications and Allergies reviewed.   Pertinent Past Medical, Surgical, Family and Social History were reviewed.        Objective     Vitals:    04/26/24 1315   BP: 100/64   Pulse: 64   Resp: 19   Temp: 98.5 F (36.9 C)   TempSrc: Tympanic   SpO2: 98%   Weight: 105.2 kg (232 lb)   Height: 1.6 m (5' 3)      Body mass index is 41.1 kg/m.          Physical Exam  Constitutional:       General: She is not in acute distress.     Appearance: She is well-developed.   HENT:      Head: Normocephalic and atraumatic.      Right Ear: Tympanic membrane and external ear normal.      Left Ear: Tympanic membrane and external ear normal.      Mouth/Throat:      Pharynx: No oropharyngeal exudate.     Eyes: Conjunctivae are normal. Right conjunctiva is not injected. Left conjunctiva is not injected. No scleral icterus. Cardiovascular:      Rate and Rhythm: Normal rate and regular rhythm.      Heart sounds: Normal heart sounds. No murmur heard.  Pulmonary:      Effort:  Pulmonary effort is normal. No respiratory distress.      Breath sounds: Normal breath sounds. No wheezing.   Musculoskeletal:      Cervical back: Normal range of motion and neck supple.   Lymphadenopathy:      Cervical: No cervical adenopathy.   Neurological:      Mental Status: She is alert and oriented to person, place, and time.   Skin:     General: Skin is warm and dry.      Findings: No rash.   Vitals and nursing note reviewed.      Physical Exam      Urgent Care Course   There were no labs reviewed with this patient during the visit.    X-Ray  The following X-ray studies were ordered, visualized and independently interpreted by me. Results were discussed with the patient/family.     XR Chest 2 Views  Result Date: 04/26/2024  HISTORY: Cough  COMPARISON: 11/25/2023 FINDINGS: Normal cardiomediastinal silhouette. No focal consolidation, pleural effusion or pneumothorax.     No acute intrathoracic process. [place] Rowan, MD 04/26/2024 2:20 PM        Procedures   Procedures     Assessment / Plan     Differential Diagnoses including but not limited to: Sinusitis, Bronchitis, Pharyngitis, Pneumonia, Influenza, COVID-19 and Allergic Rhinitis     Assessment & Plan  Bacterial upper respiratory infection  Cough with phlegm for ten days, rhinorrhea, pharyngitis, otalgia, and fatigue. Differential includes bronchitis and pneumonia. Recent DVT treated with Eliquis . Wheezing managed with albuterol .  - Two-view chest x-ray negative for pneumonia.  Called and discussed results with the patient.  Take the medication as this prescribed.  If symptoms worsen seek medical attention immediately.  Follow-up with the primary care/urgent care if symptoms does not get better in 3 days       Shabana was seen today  for cough.    Diagnoses and all orders for this visit:    Acute cough  -     XR Chest 2 Views; Future    Bacterial upper respiratory infection    Other orders  -     amoxicillin  (AMOXIL ) 875 MG tablet; Take 1 tablet (875 mg)  by mouth 2 (two) times daily for 7 days         The indications for early follow-up with PCP and return to UC were discussed. Patient/family received education on the working diagnosis, diagnostic uncertainties, and proposed treatment plan. Indications for emergency evaluation in the ED were reviewed. Written and verbal discharge instructions were provided and discussed and all questions from the patient/family were addressed, with no apparent barriers.      Verbal consent obtained to record this visit.

## 2024-05-04 ENCOUNTER — Encounter (INDEPENDENT_AMBULATORY_CARE_PROVIDER_SITE_OTHER): Payer: Self-pay

## 2024-05-13 ENCOUNTER — Other Ambulatory Visit (INDEPENDENT_AMBULATORY_CARE_PROVIDER_SITE_OTHER): Payer: Self-pay | Admitting: Family

## 2024-05-13 DIAGNOSIS — E039 Hypothyroidism, unspecified: Secondary | ICD-10-CM

## 2024-05-22 ENCOUNTER — Other Ambulatory Visit (INDEPENDENT_AMBULATORY_CARE_PROVIDER_SITE_OTHER): Payer: Self-pay | Admitting: Internal Medicine

## 2024-05-22 DIAGNOSIS — E785 Hyperlipidemia, unspecified: Secondary | ICD-10-CM

## 2024-05-25 MED ORDER — ROSUVASTATIN CALCIUM 5 MG PO TABS: 5.0000 mg | ORAL_TABLET | Freq: Every evening | ORAL | 1 refills | Status: AC

## 2024-05-26 ENCOUNTER — Other Ambulatory Visit (INDEPENDENT_AMBULATORY_CARE_PROVIDER_SITE_OTHER): Payer: Self-pay | Admitting: Internal Medicine

## 2024-05-26 DIAGNOSIS — E785 Hyperlipidemia, unspecified: Secondary | ICD-10-CM

## 2024-05-27 ENCOUNTER — Other Ambulatory Visit (INDEPENDENT_AMBULATORY_CARE_PROVIDER_SITE_OTHER): Payer: Self-pay | Admitting: Internal Medicine

## 2024-05-27 NOTE — Telephone Encounter (Signed)
 Copied from CRM [id number]. Topic: Clinical Support - Prescription Refill  >> May 27, 2024  9:57 AM Ivey MATSU wrote:  Name, strength, directions of requested refill(s):  metoprolol  succinate XL (TOPROL -XL) 25 MG 24 hr tablet (Order [id number])    How much medication is remaining: 1 week    Pharmacy to send refill to or patient to pick up rx from office (mark requested pharmacy in [facility]):    @[facility] @  [id number] - [place], [place] - [address]    Please mark X next to the preferred call back number:    Mobile: [phone] (mobile)   Home: [phone] (home)   Work: @WORKPHONE @       Medication refill request, see above. Thank you   Patient has been informed that medication refill requests should be called in up to one week prior to running out of medication.    Additional Notes:patient is requesting if Dr Sallyann can fill this medication for her.     Next Visit: 06/30/2024

## 2024-05-29 ENCOUNTER — Other Ambulatory Visit (INDEPENDENT_AMBULATORY_CARE_PROVIDER_SITE_OTHER): Payer: Self-pay | Admitting: Internal Medicine

## 2024-05-29 DIAGNOSIS — I471 Supraventricular tachycardia, unspecified: Secondary | ICD-10-CM

## 2024-05-30 MED ORDER — METOPROLOL SUCCINATE ER 25 MG PO TB24: 25.0000 mg | ORAL_TABLET | Freq: Every day | ORAL | 1 refills | Status: AC

## 2024-06-04 ENCOUNTER — Encounter (INDEPENDENT_AMBULATORY_CARE_PROVIDER_SITE_OTHER): Payer: Self-pay

## 2024-06-30 ENCOUNTER — Ambulatory Visit (INDEPENDENT_AMBULATORY_CARE_PROVIDER_SITE_OTHER): Admitting: Internal Medicine

## 2024-08-18 ENCOUNTER — Ambulatory Visit (INDEPENDENT_AMBULATORY_CARE_PROVIDER_SITE_OTHER): Admitting: Internal Medicine
